# Patient Record
Sex: Male | Born: 1950 | Race: Black or African American | Hispanic: No | Marital: Single | Smoking: Former smoker
Health system: Southern US, Community
[De-identification: ages and names within clinical notes are randomized; demographics above are authoritative.]

## PROBLEM LIST (undated history)

## (undated) DIAGNOSIS — F79 Unspecified intellectual disabilities: Secondary | ICD-10-CM

## (undated) DIAGNOSIS — I10 Essential (primary) hypertension: Secondary | ICD-10-CM

---

## 1998-03-17 ENCOUNTER — Other Ambulatory Visit: Admission: RE | Admit: 1998-03-17 | Discharge: 1998-03-17 | Payer: Self-pay | Admitting: Family Medicine

## 2002-04-07 ENCOUNTER — Emergency Department (HOSPITAL_COMMUNITY): Admission: EM | Admit: 2002-04-07 | Discharge: 2002-04-07 | Payer: Self-pay | Admitting: Emergency Medicine

## 2011-11-10 ENCOUNTER — Emergency Department (HOSPITAL_COMMUNITY)
Admission: EM | Admit: 2011-11-10 | Discharge: 2011-11-10 | Disposition: A | Discharge status: Home / Self Care | Payer: Medicaid Other | Attending: Emergency Medicine | Admitting: Emergency Medicine

## 2011-11-10 ENCOUNTER — Encounter (HOSPITAL_COMMUNITY): Payer: Self-pay | Admitting: *Deleted

## 2011-11-10 DIAGNOSIS — X58XXXA Exposure to other specified factors, initial encounter: Secondary | ICD-10-CM | POA: Insufficient documentation

## 2011-11-10 DIAGNOSIS — F79 Unspecified intellectual disabilities: Secondary | ICD-10-CM | POA: Insufficient documentation

## 2011-11-10 DIAGNOSIS — T783XXA Angioneurotic edema, initial encounter: Secondary | ICD-10-CM | POA: Insufficient documentation

## 2011-11-10 DIAGNOSIS — I10 Essential (primary) hypertension: Secondary | ICD-10-CM | POA: Insufficient documentation

## 2011-11-10 DIAGNOSIS — E119 Type 2 diabetes mellitus without complications: Secondary | ICD-10-CM | POA: Insufficient documentation

## 2011-11-10 DIAGNOSIS — Z79899 Other long term (current) drug therapy: Secondary | ICD-10-CM | POA: Insufficient documentation

## 2011-11-10 HISTORY — DX: Unspecified intellectual disabilities: F79

## 2011-11-10 HISTORY — DX: Essential (primary) hypertension: I10

## 2011-11-10 MED ORDER — DIPHENHYDRAMINE HCL 25 MG PO CAPS
25.0000 mg | ORAL_CAPSULE | Freq: Once | ORAL | Status: AC
  Administered 2011-11-10: 25 mg via ORAL
  Filled 2011-11-10: qty 1

## 2011-11-10 MED ORDER — FAMOTIDINE 20 MG PO TABS: 20.0000 mg | ORAL_TABLET | Freq: Two times a day (BID) | ORAL | Status: DC

## 2011-11-10 MED ORDER — DIPHENHYDRAMINE HCL 25 MG PO TABS: 25.0000 mg | ORAL_TABLET | Freq: Four times a day (QID) | ORAL | Status: AC

## 2011-11-10 MED ORDER — FAMOTIDINE 20 MG PO TABS
20.0000 mg | ORAL_TABLET | Freq: Once | ORAL | Status: AC
  Administered 2011-11-10: 20 mg via ORAL
  Filled 2011-11-10: qty 1

## 2011-11-10 NOTE — ED Provider Notes (Signed)
History     CSN: [id number]  Arrival date & time 11/10/11  1623   First MD Initiated Contact with Patient 11/10/11 1714      Chief Complaint  Patient presents with  . Facial Swelling    (Consider location/radiation/quality/duration/timing/severity/associated sxs/prior treatment) HPI  Past Medical History  Diagnosis Date  . Diabetes mellitus   . Hypertension   . Mental retardation     History reviewed. No pertinent past surgical history.  History reviewed. No pertinent family history.  History  Substance Use Topics  . Smoking status: Never Smoker   . Smokeless tobacco: Not on file  . Alcohol Use: No      Review of Systems  Allergies  Review of patient's allergies indicates no known allergies.  Home Medications   Current Outpatient Rx  Name Route Sig Dispense Refill  . BENZTROPINE MESYLATE 0.5 MG PO TABS Oral Take 0.5 mg by mouth 2 (two) times daily.    Marland Kitchen CLOTRIMAZOLE 1 % EX CREA Topical Apply topically 2 (two) times daily as needed. For foot fungus    . GLIPIZIDE ER 5 MG PO TB24 Oral Take 5 mg by mouth 2 (two) times daily.    Marland Kitchen HYDROCHLOROTHIAZIDE 25 MG PO TABS Oral Take 25 mg by mouth daily.    Marland Kitchen LISINOPRIL 20 MG PO TABS Oral Take 20 mg by mouth daily.    Marland Kitchen LORATADINE 10 MG PO TABS Oral Take 10 mg by mouth every morning.    Marland Kitchen NIFEDIPINE 10 MG PO CAPS Oral Take 20 mg by mouth 2 (two) times daily.    Marland Kitchen NORTRIPTYLINE HCL 25 MG PO CAPS Oral Take 25 mg by mouth at bedtime.    Marland Kitchen POTASSIUM CHLORIDE CRYS ER 20 MEQ PO TBCR Oral Take 20 mEq by mouth daily.    Marland Kitchen RISPERIDONE 0.5 MG PO TBDP Oral Take 0.5-3 mg by mouth 2 (two) times daily. Takes 0.5mg  in the morning and 3mg  at night    . SERTRALINE HCL 50 MG PO TABS Oral Take 50 mg by mouth daily.    Marland Kitchen SOLIFENACIN SUCCINATE 5 MG PO TABS Oral Take 10 mg by mouth daily.    Marland Kitchen UREA 10 % EX LOTN Topical Apply topically 2 (two) times daily as needed. For foot fungus      BP 140/88  Pulse 74  Temp(Src) 97.9 F (36.6 C)  (Axillary)  Resp 18  SpO2 99%  Physical Exam  ED Course  Procedures (including critical care time)  Labs Reviewed - No data to display No results found.   No diagnosis found.    MDM  Duplicate note        Doug Sou, MD 11/14/11 [phone]

## 2011-11-10 NOTE — ED Provider Notes (Signed)
History    CSN: [id number]  Arrival date & time 11/10/11  1623   First MD Initiated Contact with Patient 11/10/11 1714      Chief Complaint  Patient presents with  . Facial Swelling    (Consider location/radiation/quality/duration/timing/severity/associated sxs/prior treatment) The history is provided by the patient and a caregiver. No language interpreter was used.  Patient developed swelling to right side of face earlier today.  No known precipitating cause, however, patient does take lisinopril.  Patient evaluated by his dentist today, no indication of tooth abscess or dental problem.  No shortness of breath.  No difficulty swallowing.  Handling secretions without difficulty.  Past Medical History  Diagnosis Date  . Diabetes mellitus   . Hypertension   . Mental retardation     History reviewed. No pertinent past surgical history.  History reviewed. No pertinent family history.  History  Substance Use Topics  . Smoking status: Never Smoker   . Smokeless tobacco: Not on file  . Alcohol Use: No      Review of Systems  HENT: Positive for facial swelling. Negative for drooling, trouble swallowing and voice change.   All other systems reviewed and are negative.    Allergies  Review of patient's allergies indicates no known allergies.  Home Medications   Current Outpatient Rx  Name Route Sig Dispense Refill  . BENZTROPINE MESYLATE 0.5 MG PO TABS Oral Take 0.5 mg by mouth 2 (two) times daily.    Marland Kitchen CLOTRIMAZOLE 1 % EX CREA Topical Apply topically 2 (two) times daily as needed. For foot fungus    . GLIPIZIDE ER 5 MG PO TB24 Oral Take 5 mg by mouth 2 (two) times daily.    Marland Kitchen HYDROCHLOROTHIAZIDE 25 MG PO TABS Oral Take 25 mg by mouth daily.    Marland Kitchen LISINOPRIL 20 MG PO TABS Oral Take 20 mg by mouth daily.    Marland Kitchen LORATADINE 10 MG PO TABS Oral Take 10 mg by mouth every morning.    Marland Kitchen NIFEDIPINE 10 MG PO CAPS Oral Take 20 mg by mouth 2 (two) times daily.    Marland Kitchen NORTRIPTYLINE HCL  25 MG PO CAPS Oral Take 25 mg by mouth at bedtime.    Marland Kitchen POTASSIUM CHLORIDE CRYS ER 20 MEQ PO TBCR Oral Take 20 mEq by mouth daily.    Marland Kitchen RISPERIDONE 0.5 MG PO TBDP Oral Take 0.5-3 mg by mouth 2 (two) times daily. Takes 0.5mg  in the morning and 3mg  at night    . SERTRALINE HCL 50 MG PO TABS Oral Take 50 mg by mouth daily.    Marland Kitchen SOLIFENACIN SUCCINATE 5 MG PO TABS Oral Take 10 mg by mouth daily.    Marland Kitchen UREA 10 % EX LOTN Topical Apply topically 2 (two) times daily as needed. For foot fungus      BP 140/88  Pulse 74  Temp(Src) 97.9 F (36.6 C) (Axillary)  Resp 18  SpO2 99%  Physical Exam  Nursing note and vitals reviewed. Constitutional: He is oriented to person, place, and time. He appears well-developed and well-nourished.  Eyes: Pupils are equal, round, and reactive to light.  Neck: Normal range of motion. Neck supple.  Cardiovascular: Normal rate, regular rhythm, normal heart sounds and intact distal pulses.   Pulmonary/Chest: Effort normal and breath sounds normal. No respiratory distress.  Abdominal: Soft. Bowel sounds are normal.  Musculoskeletal: Normal range of motion.  Lymphadenopathy:    He has no cervical adenopathy.  Neurological: He is alert and oriented to  person, place, and time.  Skin: Skin is warm and dry.  Psychiatric: He has a normal mood and affect. His behavior is normal. Judgment and thought content normal.    ED Course  Procedures (including critical care time)  Labs Reviewed - No data to display No results found.   No diagnosis found.  Patient seen by Dr. Ethelda Chick.  Likely mild angioedema d/t lisinopril.  No difficulty swallowing, no tongue swelling.  Handling secretions without difficulty.   Patient to stop lisinopril and contact Dr. Ginette Otto for appointment next week.  MDM          Jimmye Norman, NP 11/11/11 0003

## 2011-11-10 NOTE — Discharge Instructions (Signed)
Angioedema Angioedema (AE) is sudden puffiness (swelling) of the skin. It can happen to the face, genitals, and other body parts. You may be reacting to something you are sensitive to (allergic reaction). It may have been passed to you from your parents (hereditary), or it may develop by itself (acquired). You may also get red itchy patches of skin (hives). Attacks may be mild. Some attacks are life-threatening. Most often, the puffiness happens fast. It often gets better in 24 to 48 hours.  HOME CARE  Carry your emergency allergy medicines with you.   Wear a medical bracelet.   Avoid things that you know will cause this reaction (triggers).  GET HELP RIGHT AWAY IF:   You have trouble breathing.   You have trouble swallowing.   You pass out (faint).   You have another attack.   Your attacks happen more often or get worse.   AE was passed to you by your parents and you want to start having children.  MAKE SURE YOU:   Understand these instructions.   Will watch your condition.   Will get help right away if you are not doing well or get worse.  Document Released: 08/03/2009 Document Revised: 08/04/2011 Document Reviewed: 08/03/2009 [employer] Patient Information 2012 [employer]. 

## 2011-11-10 NOTE — ED Notes (Signed)
Pt woke up with swelling to right side of face, pt was taken to the dentist and dentist stated there was nothing wrong with his teeth. Pt reports pain to area.

## 2011-11-10 NOTE — ED Provider Notes (Signed)
Patient mentally retarded level V caveat history is given from his caregiver Patient with swelling of right cheek onset today seen by dentist earlier today, guardian reports that etiology of swelling is unclear, sent here for further evaluation On exam patient is alert no distress handling secretions well no swelling of tongue lips or uvula, there is mild swelling of the right cheek no fluctuance Suspect angioedema  Plan stop lisinopril Followup Dr.Avbuere next week  Doug Sou, MD 11/10/11 1733

## 2011-11-14 NOTE — ED Provider Notes (Signed)
Medical screening examination/treatment/procedure(s) were conducted as a shared visit with non-physician practitioner(s) and myself.  I personally evaluated the patient during the encounter  Doug Sou, MD 11/14/11 [phone]

## 2012-02-27 ENCOUNTER — Emergency Department (HOSPITAL_COMMUNITY): Payer: Medicaid Other

## 2012-02-27 ENCOUNTER — Emergency Department (HOSPITAL_COMMUNITY)
Admission: EM | Admit: 2012-02-27 | Discharge: 2012-02-27 | Disposition: A | Discharge status: Home / Self Care | Payer: Medicaid Other | Attending: Emergency Medicine | Admitting: Emergency Medicine

## 2012-02-27 ENCOUNTER — Encounter (HOSPITAL_COMMUNITY): Payer: Self-pay | Admitting: Emergency Medicine

## 2012-02-27 DIAGNOSIS — R109 Unspecified abdominal pain: Secondary | ICD-10-CM

## 2012-02-27 DIAGNOSIS — E119 Type 2 diabetes mellitus without complications: Secondary | ICD-10-CM | POA: Insufficient documentation

## 2012-02-27 DIAGNOSIS — R4182 Altered mental status, unspecified: Secondary | ICD-10-CM | POA: Insufficient documentation

## 2012-02-27 DIAGNOSIS — R5383 Other fatigue: Secondary | ICD-10-CM | POA: Insufficient documentation

## 2012-02-27 DIAGNOSIS — Z79899 Other long term (current) drug therapy: Secondary | ICD-10-CM | POA: Insufficient documentation

## 2012-02-27 DIAGNOSIS — I10 Essential (primary) hypertension: Secondary | ICD-10-CM | POA: Insufficient documentation

## 2012-02-27 DIAGNOSIS — R5381 Other malaise: Secondary | ICD-10-CM | POA: Insufficient documentation

## 2012-02-27 DIAGNOSIS — K5900 Constipation, unspecified: Secondary | ICD-10-CM

## 2012-02-27 DIAGNOSIS — F79 Unspecified intellectual disabilities: Secondary | ICD-10-CM | POA: Insufficient documentation

## 2012-02-27 LAB — LIPASE, BLOOD: Lipase: 15 U/L (ref 11–59)

## 2012-02-27 LAB — POCT I-STAT TROPONIN I: Troponin i, poc: 0.01 ng/mL (ref 0.00–0.08)

## 2012-02-27 LAB — CBC WITH DIFFERENTIAL/PLATELET
Hemoglobin: 13.2 g/dL (ref 13.0–17.0)
Lymphs Abs: 0.6 10*3/uL — ABNORMAL LOW (ref 0.7–4.0)
Monocytes Relative: 3 % (ref 3–12)
Neutro Abs: 5.4 10*3/uL (ref 1.7–7.7)
Neutrophils Relative %: 87 % — ABNORMAL HIGH (ref 43–77)
RBC: 4.29 MIL/uL (ref 4.22–5.81)

## 2012-02-27 LAB — COMPREHENSIVE METABOLIC PANEL
Albumin: 3.7 g/dL (ref 3.5–5.2)
Alkaline Phosphatase: 65 U/L (ref 39–117)
BUN: 13 mg/dL (ref 6–23)
Chloride: 100 mEq/L (ref 96–112)
Glucose, Bld: 194 mg/dL — ABNORMAL HIGH (ref 70–99)
Potassium: 4.3 mEq/L (ref 3.5–5.1)
Total Bilirubin: 0.2 mg/dL — ABNORMAL LOW (ref 0.3–1.2)

## 2012-02-27 LAB — URINALYSIS, ROUTINE W REFLEX MICROSCOPIC
Bilirubin Urine: NEGATIVE
Hgb urine dipstick: NEGATIVE
Ketones, ur: NEGATIVE mg/dL
Nitrite: NEGATIVE
pH: 7 (ref 5.0–8.0)

## 2012-02-27 IMAGING — CT CT HEAD W/O CM
1 of 3 series · 16 of 30 positions shown, 20 images · non-contrast
Comparison: None.

CLINICAL DATA: Altered mental status, headache

CT HEAD WITHOUT CONTRAST
TECHNIQUE: Contiguous axial images were obtained from the base of
the skull through the vertex without contrast.

[Series 2: head routine 4.8 h37s · axial · 0.46mm/px · z∈[-126,+31]mm · 16 of 36 slices shown, 20 images]
[im 2/36  brain]
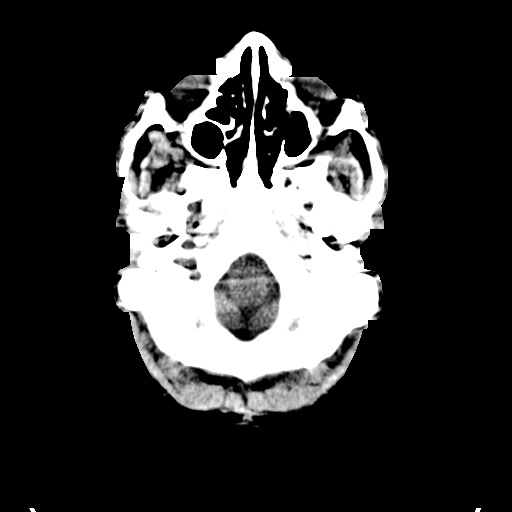
[im 2/36  bone]
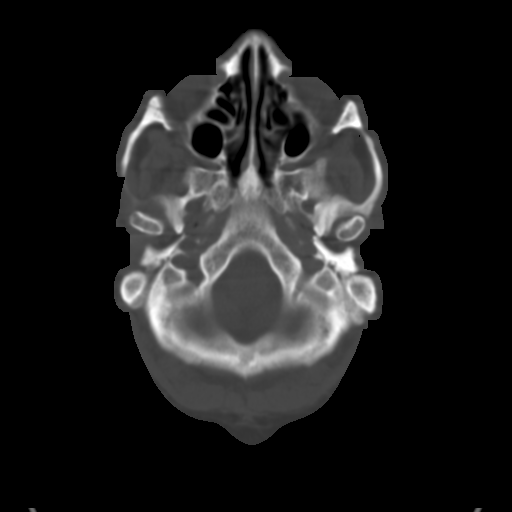
[im 4/36  brain]
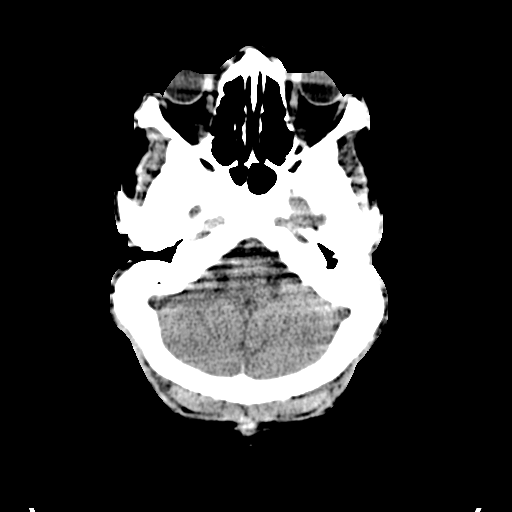
[im 6/36  brain]
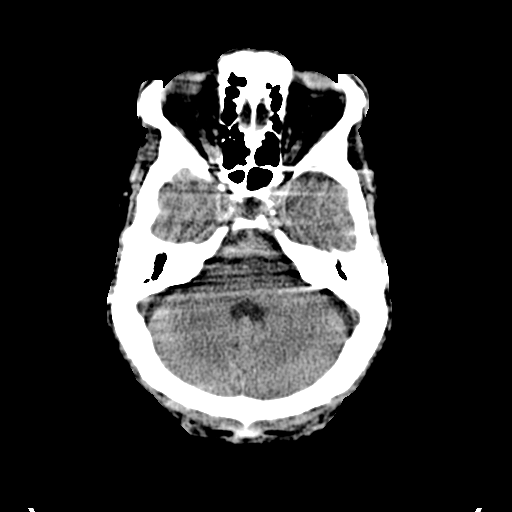
[im 8/36  brain]
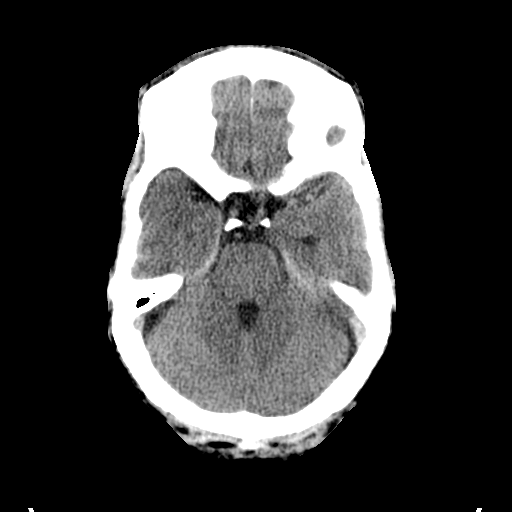
[im 12/36  brain]
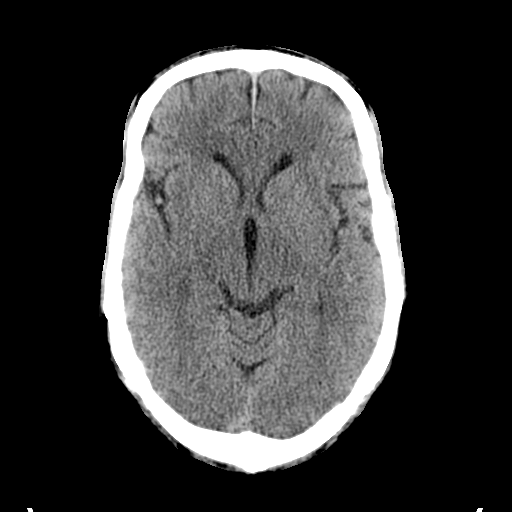
[im 12/36  bone]
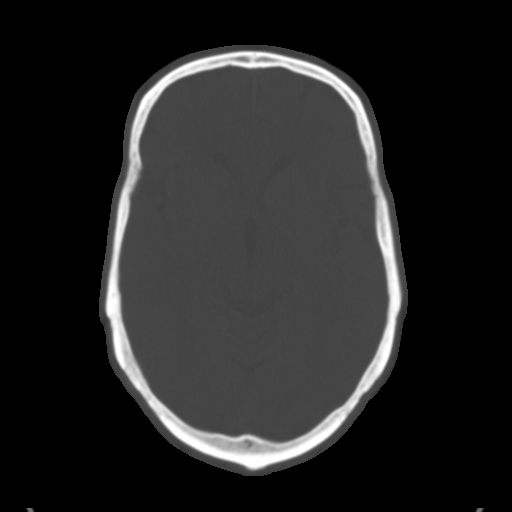
[im 13/36  brain]
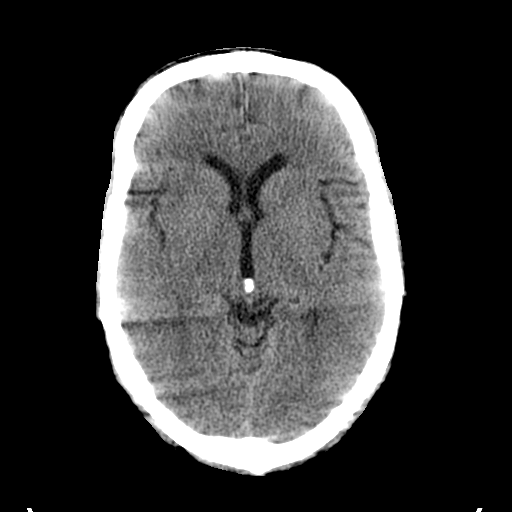
[im 15/36  brain]
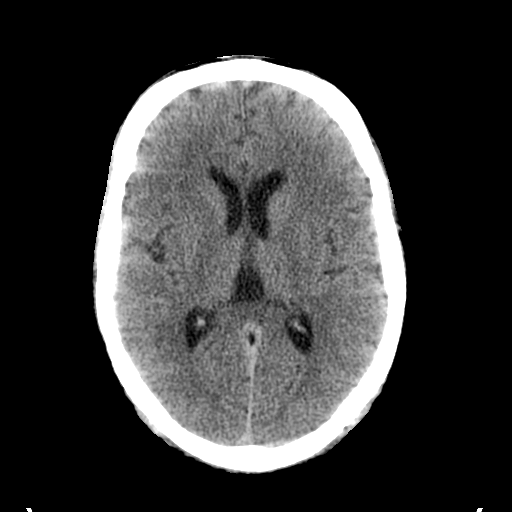
[im 17/36  brain]
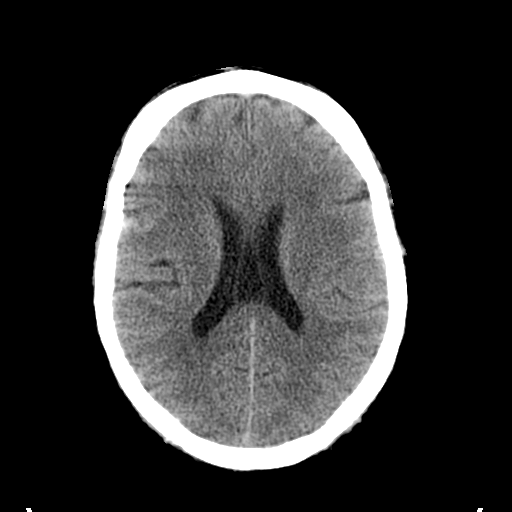
[im 19/36  brain]
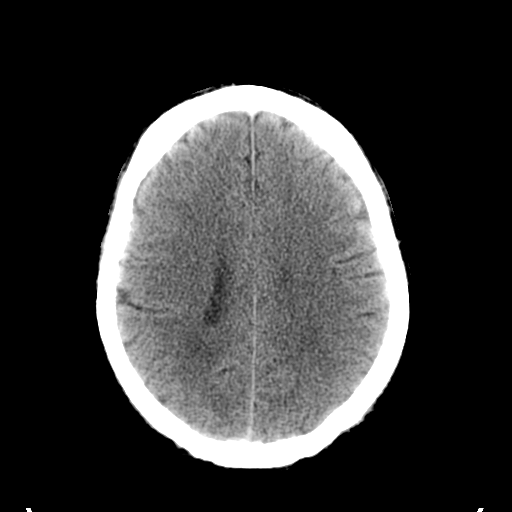
[im 19/36  bone]
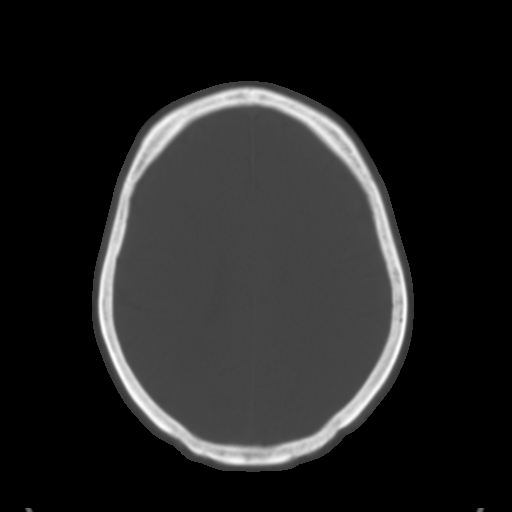
[im 21/36  brain]
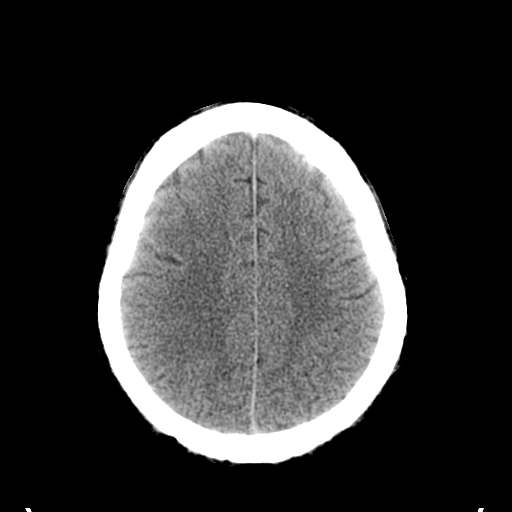
[im 23/36  brain]
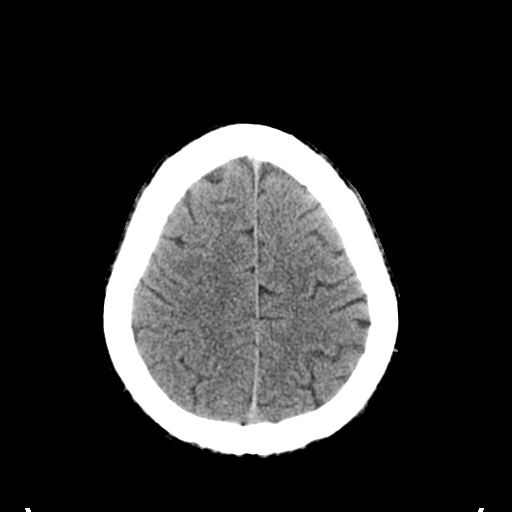
[im 24/36  brain]
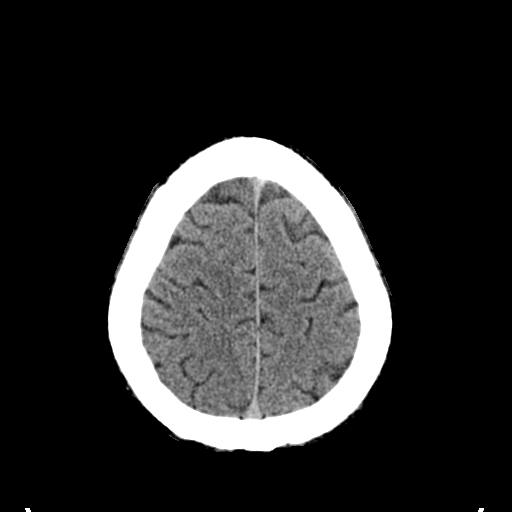
[im 28/36  brain]
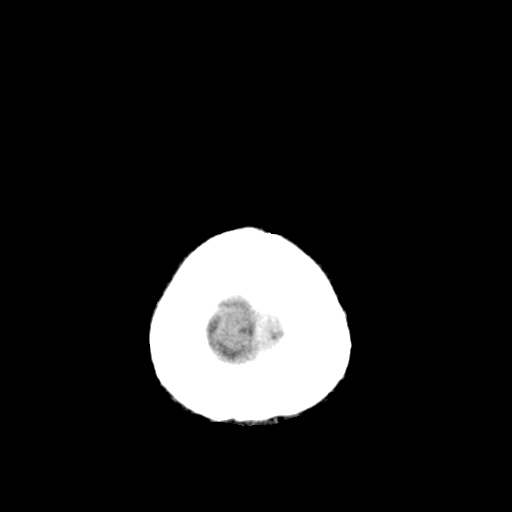
[im 28/36  bone]
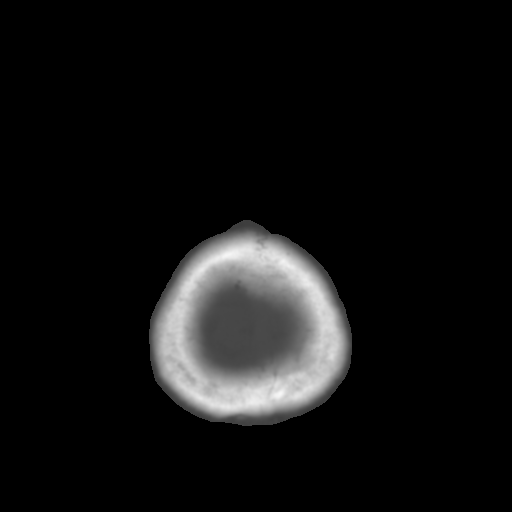
[im 30/36  brain]
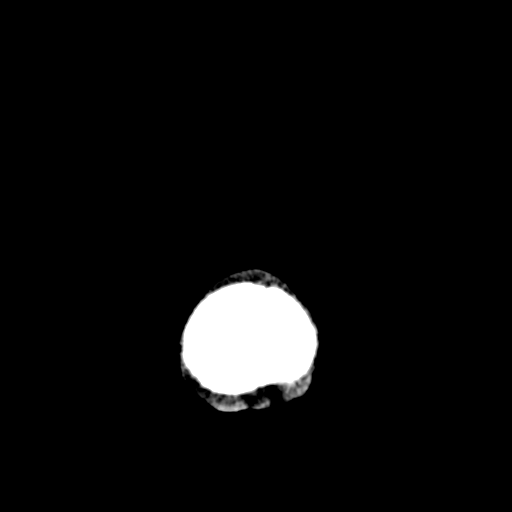
[im 32/36  brain]
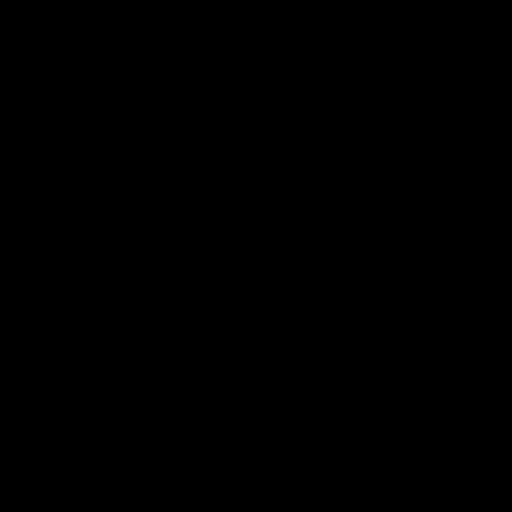
[im 34/36  brain]
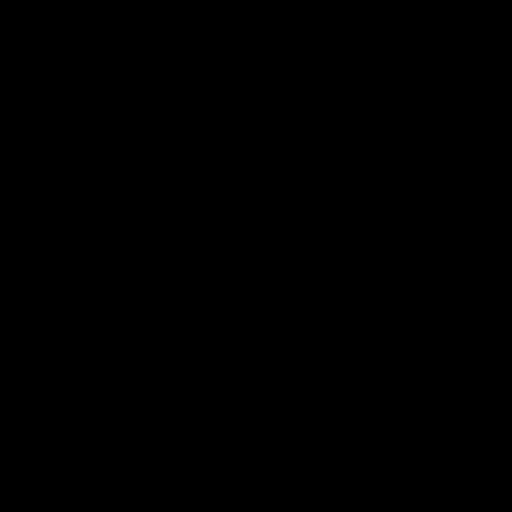

[16 of 30 positions shown; findings below may reference images not displayed]

FINDINGS: No skull fracture is noted.  Paranasal sinuses and mastoid air
cells are unremarkable.

No intracranial hemorrhage, mass effect or midline shift.  Motion
artifact are noted.

There is no acute infarction.  No mass lesion is noted on this
unenhanced scan.  The gray and white matter differentiation is
preserved.
No intra or extra-axial fluid collection.
IMPRESSION: No acute intracranial abnormality.

## 2012-02-27 IMAGING — CR DG ABDOMEN ACUTE W/ 1V CHEST
3 series · 3 of 3 positions shown · non-contrast
Comparison: None.

CLINICAL DATA: Epigastric pain

ACUTE ABDOMEN SERIES (ABDOMEN 2 VIEW & CHEST 1 VIEW)

[x chest ap]
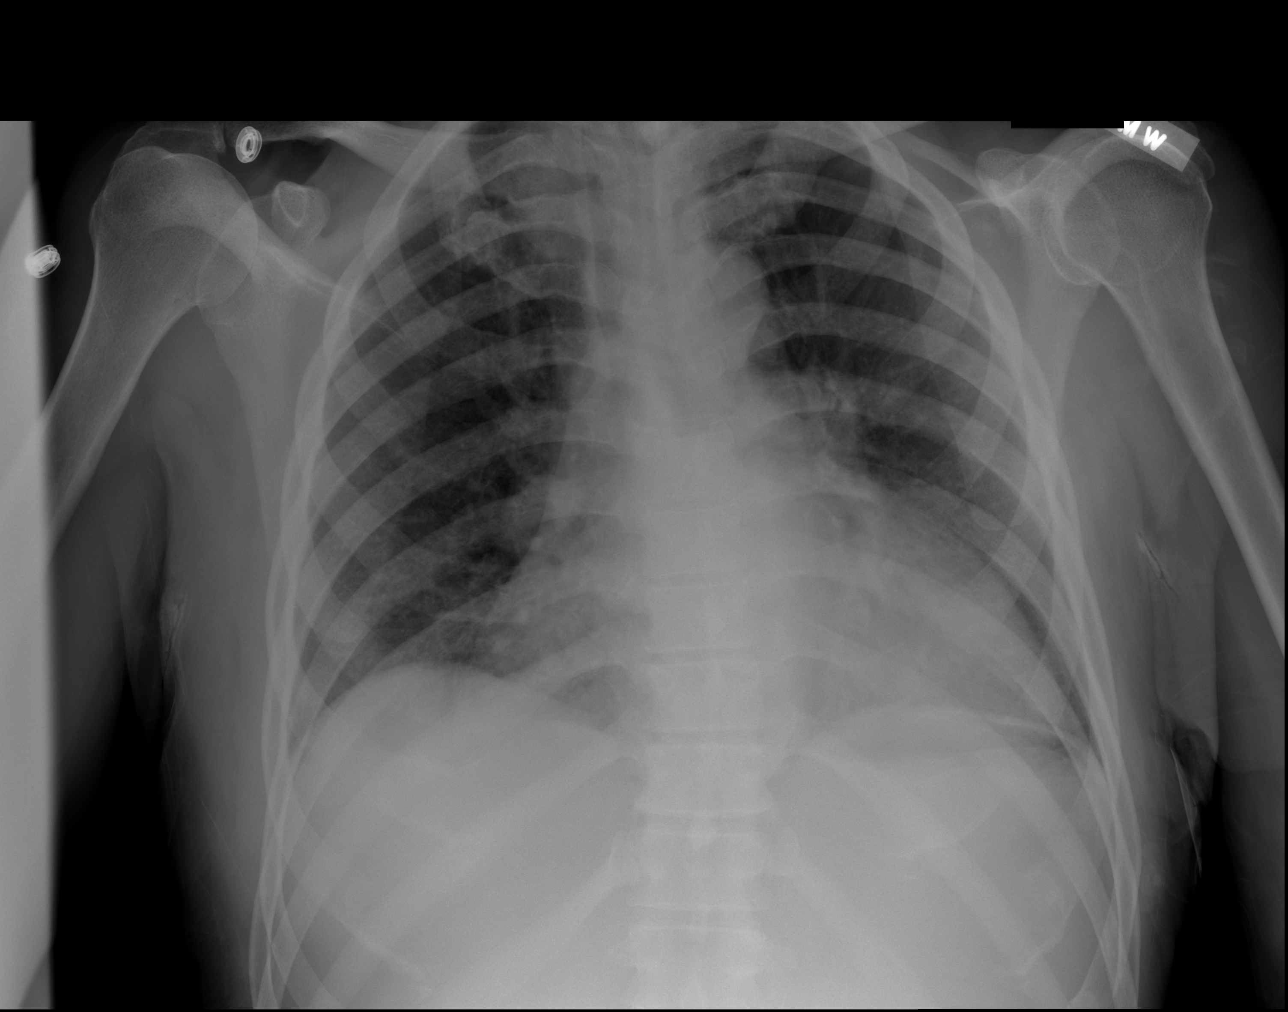

[w abdomen decub]
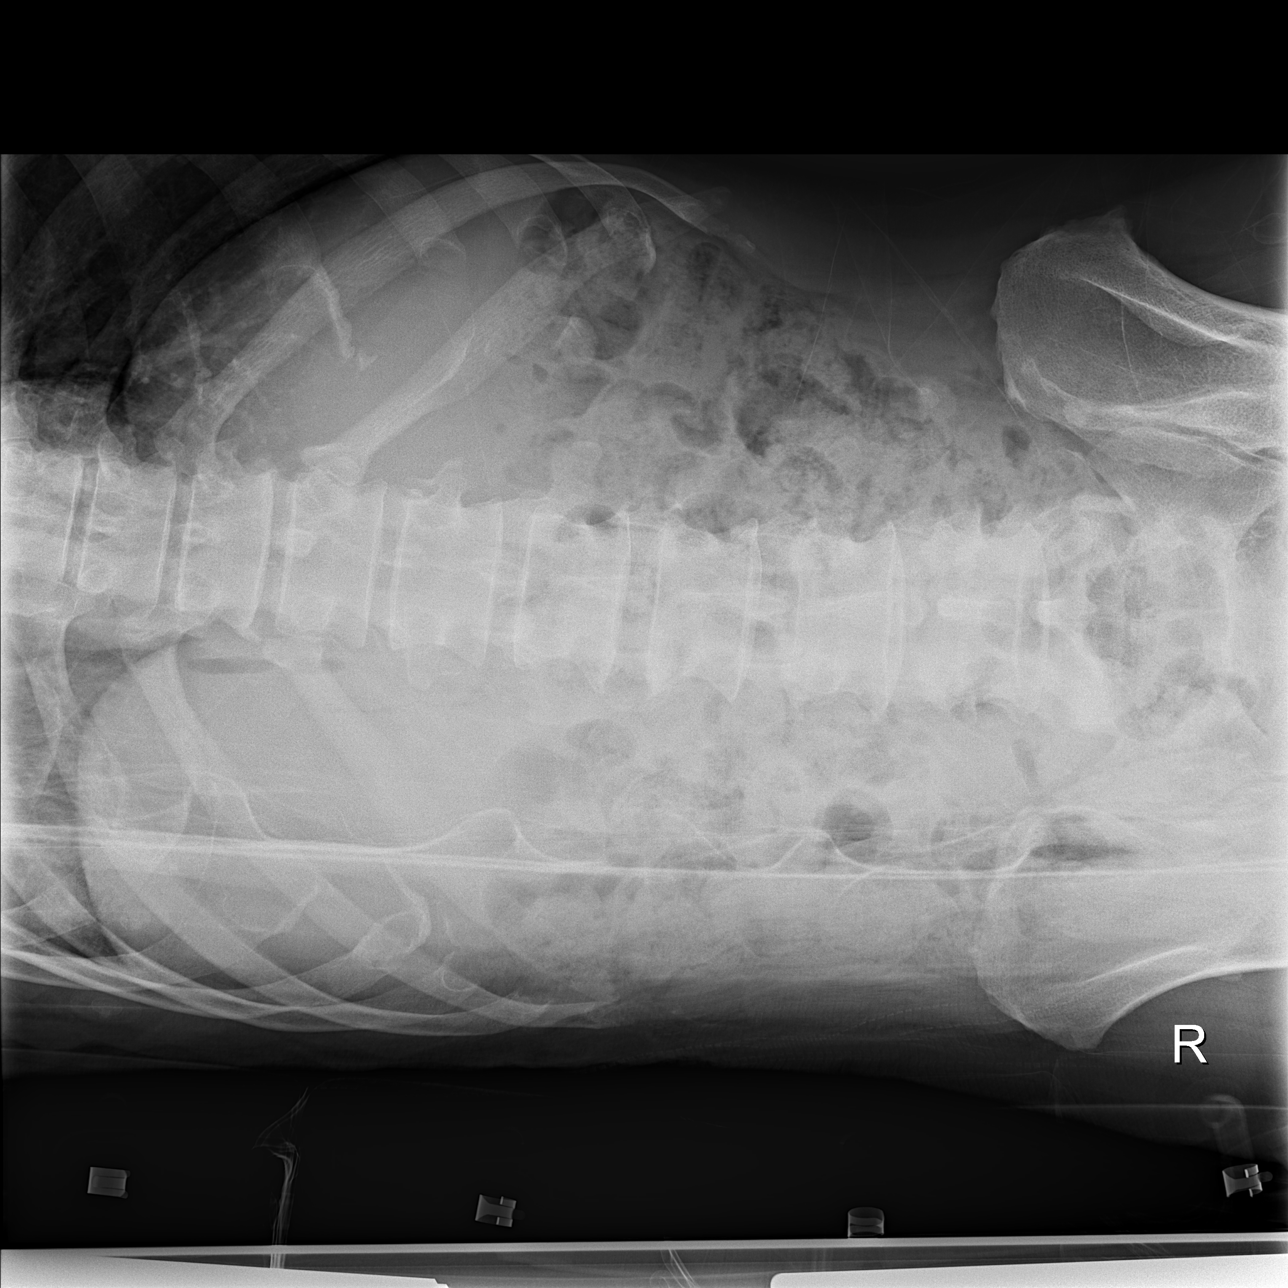

[t abdomen supine]
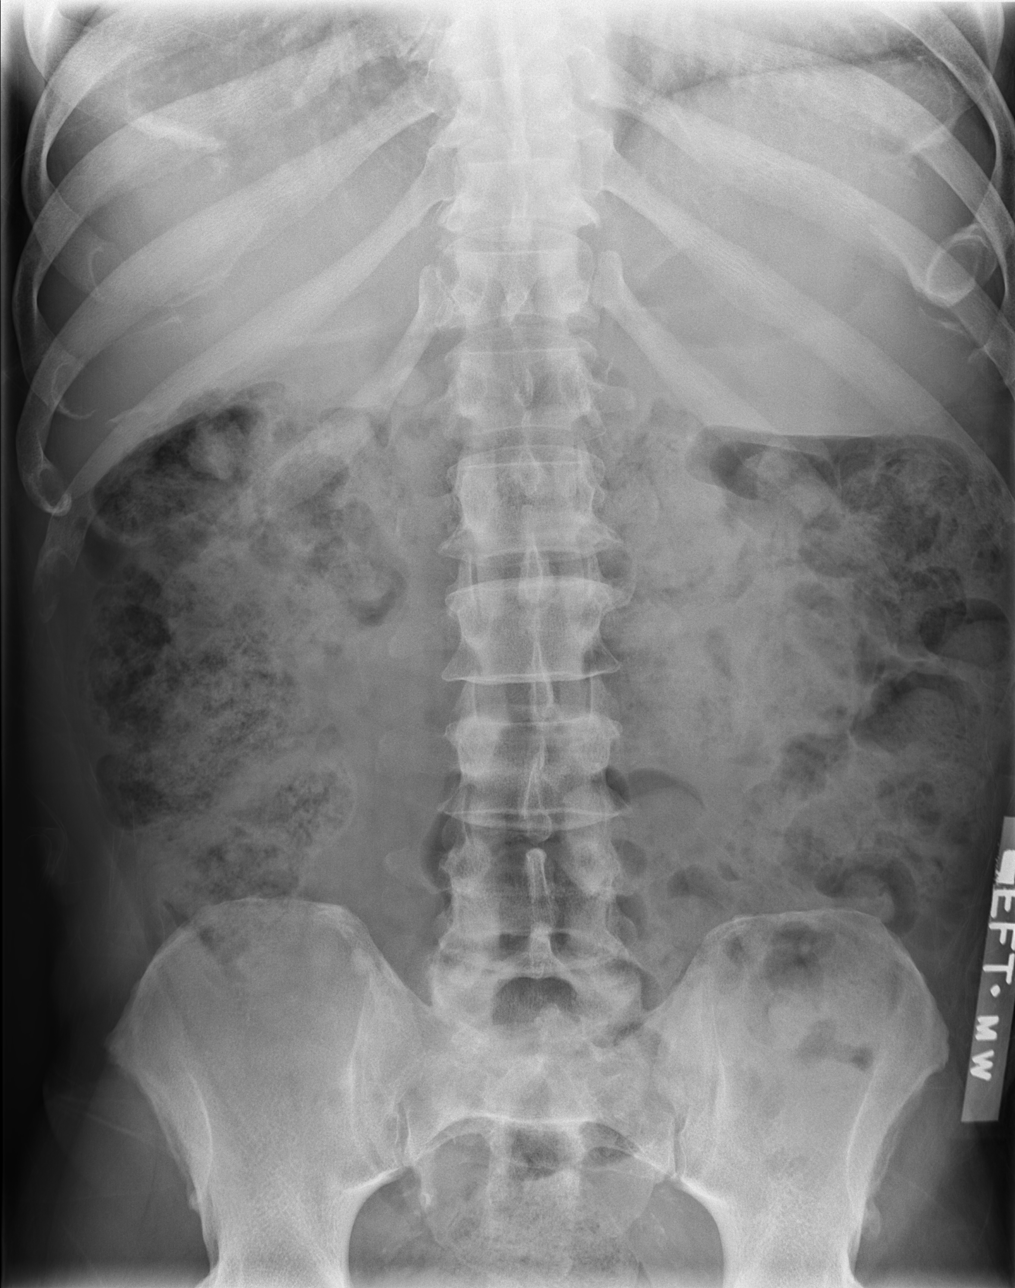

[3 of 3 positions shown; findings below may reference images not displayed]

FINDINGS: Borderline cardiomegaly noted.  No acute infiltrate or
pulmonary edema.  Mild basilar atelectasis.

There is nonspecific nonobstructive bowel gas pattern.  Stool noted
throughout the colon.  No free abdominal air.  Bony structures are
unremarkable.
IMPRESSION: No active disease.  Borderline cardiomegaly.  Nonspecific
nonobstructive bowel gas pattern.  Stool noted throughout the
colon.  No free abdominal air.

## 2012-02-27 MED ORDER — IOHEXOL 300 MG/ML  SOLN
20.0000 mL | INTRAMUSCULAR | Status: AC
  Administered 2012-02-27 (×2): 20 mL via ORAL

## 2012-02-27 MED ORDER — GI COCKTAIL ~~LOC~~
30.0000 mL | Freq: Once | ORAL | Status: AC
  Administered 2012-02-27: 30 mL via ORAL
  Filled 2012-02-27: qty 30

## 2012-02-27 MED ORDER — IOHEXOL 300 MG/ML  SOLN
80.0000 mL | Freq: Once | INTRAMUSCULAR | Status: AC | PRN
  Administered 2012-02-27: 80 mL via INTRAVENOUS

## 2012-02-27 MED ORDER — POLYETHYLENE GLYCOL 3350 17 GM/SCOOP PO POWD: 17.0000 g | Freq: Every day | ORAL | Status: AC

## 2012-02-27 MED ORDER — ONDANSETRON HCL 4 MG/2ML IJ SOLN
4.0000 mg | Freq: Once | INTRAMUSCULAR | Status: AC
  Administered 2012-02-27: 4 mg via INTRAVENOUS
  Filled 2012-02-27: qty 2

## 2012-02-27 NOTE — ED Provider Notes (Signed)
History     CSN: [id number]  Arrival date & time 02/27/12  0907   First MD Initiated Contact with Patient 02/27/12 1008      Chief Complaint  Patient presents with  . Abdominal Pain  . Fatigue  . Altered Mental Status    (Consider location/radiation/quality/duration/timing/severity/associated sxs/prior treatment) HPI  Patient who has mental retardation as well as non-insulin-dependent diabetes and hypertension is brought to the emergency department by his group home caretaker with complaint of patient waking this morning complaining of abdominal pain as well as the group home staff noting that the patient had more slurred speech more than his baseline slurred speech and states that he was "stumbling about the house." They state that patient continues to complain of abdominal pain when he does not normally have complaints of pain. They state that he felt well yesterday and ate and drank normally and had a normal bowel movement last night. Group home caregiver states that he did have a small breakfast this morning. He denies fevers, chills, vomiting, diarrhea, recent sick illness or sick contacts, noticeable extremity weakness, facial droop. A level V caveat applies due to patient's mental retardation.  Past Medical History  Diagnosis Date  . Diabetes mellitus   . Hypertension   . Mental retardation     History reviewed. No pertinent past surgical history.  History reviewed. No pertinent family history.  History  Substance Use Topics  . Smoking status: Never Smoker   . Smokeless tobacco: Not on file  . Alcohol Use: No      Review of Systems  Unable to perform ROS   Allergies  Review of patient's allergies indicates no known allergies.  Home Medications   Current Outpatient Rx  Name Route Sig Dispense Refill  . BENZTROPINE MESYLATE 0.5 MG PO TABS Oral Take 0.5 mg by mouth every morning.     Marland Kitchen CLOTRIMAZOLE 1 % EX CREA Topical Apply topically 2 (two) times daily as  needed. For foot fungus    . GLIPIZIDE ER 5 MG PO TB24 Oral Take 5 mg by mouth 2 (two) times daily.    Marland Kitchen HYDROCHLOROTHIAZIDE 25 MG PO TABS Oral Take 25 mg by mouth daily.    Marland Kitchen LORATADINE 10 MG PO TABS Oral Take 10 mg by mouth every morning.    Marland Kitchen NIFEDIPINE ER OSMOTIC 90 MG PO TB24 Oral Take 90 mg by mouth daily.    Marland Kitchen NORTRIPTYLINE HCL 25 MG PO CAPS Oral Take 25 mg by mouth every morning.     Marland Kitchen POTASSIUM CHLORIDE CRYS ER 20 MEQ PO TBCR Oral Take 20 mEq by mouth daily.    Marland Kitchen RISPERIDONE 0.5 MG PO TBDP Oral Take 0.5-3 mg by mouth 2 (two) times daily. Takes 0.5mg  in the morning and 3mg  at night    . SERTRALINE HCL 50 MG PO TABS Oral Take 50 mg by mouth daily.    Marland Kitchen SOLIFENACIN SUCCINATE 5 MG PO TABS Oral Take 5 mg by mouth daily.    Marland Kitchen UREA 10 % EX LOTN Topical Apply topically 2 (two) times daily as needed. For foot fungus      BP 151/89  Pulse 73  Temp 98.6 F (37 C) (Oral)  Resp 18  SpO2 100%  Physical Exam  Nursing note and vitals reviewed. Constitutional: He appears well-developed and well-nourished. No distress.  HENT:  Head: Normocephalic and atraumatic.  Eyes: Conjunctivae and EOM are normal. Pupils are equal, round, and reactive to light.  Neck: Normal range of  motion. Neck supple.  Cardiovascular: Normal rate, regular rhythm, normal heart sounds and intact distal pulses.  Exam reveals no gallop and no friction rub.   No murmur heard. Pulmonary/Chest: Effort normal and breath sounds normal. No respiratory distress. He has no wheezes. He has no rales. He exhibits no tenderness.  Abdominal: Soft. Bowel sounds are normal. He exhibits no distension and no mass. There is tenderness. There is no rebound and no guarding.       Abdomen soft and patient will grad to epigastric region when asked to point to area of pain however abdomen is soft and I can not elicit grimacing or guarding with deep palpation of abdomen. No peritoneal signs.   Musculoskeletal: Normal range of motion. He exhibits  no edema and no tenderness.  Neurological: He is alert.       Difficult neurological exam due to patient's mental retardation and lack of understanding in given instructions.   Skin: Skin is warm and dry. No rash noted. He is not diaphoretic. No erythema.  Psychiatric: He has a normal mood and affect.    ED Course  Procedures (including critical care time)  GI cocktail, IV zofran and fluids.   Labs Reviewed  CBC WITH DIFFERENTIAL - Abnormal; Notable for the following:    Neutrophils Relative 87 (*)     Lymphocytes Relative 10 (*)     Lymphs Abs 0.6 (*)     All other components within normal limits  COMPREHENSIVE METABOLIC PANEL - Abnormal; Notable for the following:    Glucose, Bld 194 (*)     Total Bilirubin 0.2 (*)     All other components within normal limits  URINALYSIS, ROUTINE W REFLEX MICROSCOPIC - Abnormal; Notable for the following:    Protein, ur 30 (*)     All other components within normal limits  GLUCOSE, CAPILLARY - Abnormal; Notable for the following:    Glucose-Capillary 157 (*)     All other components within normal limits  LIPASE, BLOOD  POCT I-STAT TROPONIN I  URINE MICROSCOPIC-ADD ON   Ct Head Wo Contrast  02/27/2012  *RADIOLOGY REPORT*  Clinical Data: Altered mental status, headache  CT HEAD WITHOUT CONTRAST  Technique:  Contiguous axial images were obtained from the base of the skull through the vertex without contrast.  Comparison: None.  Findings: No skull fracture is noted.  Paranasal sinuses and mastoid air cells are unremarkable.  No intracranial hemorrhage, mass effect or midline shift.  Motion artifact are noted.  There is no acute infarction.  No mass lesion is noted on this unenhanced scan.  The gray and white matter differentiation is preserved. No intra or extra-axial fluid collection.  IMPRESSION: No acute intracranial abnormality.  Original Report Authenticated By: Natasha Mead, M.D.     No diagnosis found.    MDM  No neuro focal deficits.  However patient will have to be walked to wrong ataxia prior to dispo. His exam is relatively benign up until this point no acute findings on labs and non acute abdomen. Pending CT abdomen pelvis to determine ultimate disposition. Sign out given to [facility]. Dr. Preston Fleeting evaluated patient at bedside and is agreeable with plan.        [place], [place] 02/27/12 1150

## 2012-02-27 NOTE — ED Notes (Signed)
Documentation on rounding it was ment for room 14. Am 9:29 Ottis Stain

## 2012-02-27 NOTE — ED Notes (Signed)
Per group home staff; pt not acting norm when waking up this am; pt with hx of MR; pt sts abd pain; pt normally communicates with staff and not communicating well this am; pt with some mild difficulty walking; no obvious neuro deficits

## 2012-02-27 NOTE — ED Notes (Signed)
Pt back from radiology.  Monitors placed.

## 2012-02-27 NOTE — ED Notes (Signed)
Pt has finished contrast, CT notified.

## 2012-02-27 NOTE — Discharge Instructions (Signed)
Please begin taking an Aspirin 81 mg a day and Miralax for constipation. Follow-up with Dr. Concepcion Elk for further evaluation.

## 2012-02-27 NOTE — ED Notes (Signed)
Pt taken to radiology before meds could be given.  Will give meds when pt returns.

## 2012-02-27 NOTE — ED Notes (Addendum)
Owner of the group home accompanies the pt.  He states that pt had a normal BM last night.  Denies N/V.  Owner states that pt ate well last night.  Pt states his stomach hurts.  Owner states that pt appeared unsteady on his feet this morning, which is not normal for him and speech slightly more slurred.  Pt was normal when he went to bed at 1930.  Pt awoke this morning at 0600 and that is when staff noticed the changes.

## 2012-02-27 NOTE — ED Notes (Signed)
Pt's CBG was 157 after I checked it 10:03am JG.

## 2012-02-27 NOTE — ED Provider Notes (Signed)
61 year old male with a mental retardation has been complaining of abdominal pain today. Caregiver states it he is normally active and talkative and he is very subdued. He also never complains of anything hurting. Exam is of rather benign. He has mild systolic hypertension with blood pressure 150 you've 1/89. Lungs are clear and heart has regular rate and rhythm. Abdomen is soft with mild tenderness to deep palpation in the right midabdomen. No masses are felt and there is no hepatosplenomegaly. Workup has been initiated.   Date: 02/27/2012  Rate: 74  Rhythm: normal sinus rhythm and premature ventricular contractions (PVC)  QRS Axis: normal  Intervals: normal  ST/T Wave abnormalities: normal  Conduction Disutrbances:none  Narrative Interpretation: Low voltage ECG. No prior ECG available for comparison.  Old EKG Reviewed: none available    [name], MD 02/27/12 1146

## 2012-02-27 NOTE — ED Provider Notes (Signed)
4:01 PM Patient has had normal labs and imaging. Advised caregiver that patient did have moderate amount of stool and recommended stool softeners. Also recommended aspirin 81 mg daily. Advise close followup with his primary care physician Dr. Concepcion Elk. Caregiver voiced understanding and they are ready for discharge.  Thomasene Lot, PA-C 02/27/12 [phone]

## 2012-02-27 NOTE — ED Notes (Signed)
Instructions given to care giver

## 2012-02-28 NOTE — ED Provider Notes (Signed)
Medical screening examination/treatment/procedure(s) were conducted as a shared visit with non-physician practitioner(s) and myself.  I personally evaluated the patient during the encounter   Amanee Iacovelli, MD 02/28/12 0708 

## 2012-02-28 NOTE — ED Provider Notes (Signed)
Medical screening examination/treatment/procedure(s) were conducted as a shared visit with non-physician practitioner(s) and myself.  I personally evaluated the patient during the encounter   [name], MD 02/28/12 [phone]

## 2012-09-03 ENCOUNTER — Emergency Department (INDEPENDENT_AMBULATORY_CARE_PROVIDER_SITE_OTHER)
Admission: EM | Admit: 2012-09-03 | Discharge: 2012-09-03 | Disposition: A | Discharge status: Home / Self Care | Payer: Medicaid Other | Source: Home / Self Care | Attending: Emergency Medicine | Admitting: Emergency Medicine

## 2012-09-03 ENCOUNTER — Encounter (HOSPITAL_COMMUNITY): Payer: Self-pay

## 2012-09-03 DIAGNOSIS — H1130 Conjunctival hemorrhage, unspecified eye: Secondary | ICD-10-CM

## 2012-09-03 MED ORDER — POLYETHYL GLYCOL-PROPYL GLYCOL 0.4-0.3 % OP SOLN: OPHTHALMIC | Status: DC

## 2012-09-03 NOTE — ED Notes (Signed)
Eye redness noted earlier today, no known injury

## 2012-09-03 NOTE — ED Provider Notes (Signed)
Chief Complaint  Patient presents with  . Conjunctivitis    History of Present Illness:   The patient is a 62 year old male with mental retardation and paranoid schizophrenia. He is brought in today by his group home worker because of a red, right eye. There is no history of any trauma or injury. It does not seem to bother him at all he denies any pain or change in his vision. There has been no drainage. He has not had any fever, chills, nasal congestion, rhinorrhea, sore throat, or cough.  Review of Systems:  Other than noted above, the patient denies any of the following symptoms: Systemic:  No fever, chills, sweats, fatigue, or weight loss. Eye:  No redness, eye pain, photophobia, discharge, blurred vision, or diplopia. ENT:  No nasal congestion, rhinorrhea, or sore throat. Lymphatic:  No adenopathy. Skin:  No rash or pruritis.  PMFSH:  Past medical history, family history, social history, meds, and allergies were reviewed.  Physical Exam:   Vital signs:  BP 127/62  Pulse 72  Temp 97.2 F (36.2 C) (Oral)  Resp 12  SpO2 100% General:  Alert and in no distress. Eye:  He has a subcutaneous conjunctival hematoma superior to the cornea. There was no drainage, lids were normal, cornea was intact to fluorescein staining, anterior chamber was normal, funduscopic exam was normal, PERRLA, full EOMs. ENT:  TMs and canals clear.  Nasal mucosa normal.  No intra-oral lesions, mucous membranes moist, pharynx clear. Neck:  No adenopathy tenderness or mass. Skin:  Clear, warm and dry.  Assessment:  The encounter diagnosis was Subconjunctival hematoma.  Plan:   1.  The following meds were prescribed:   New Prescriptions   POLYETHYL GLYCOL-PROPYL GLYCOL (SYSTANE) 0.4-0.3 % SOLN    1 drop in right eye 3 times daily for 1 week.   2.  The patient was instructed in symptomatic care and handouts were given. 3.  The patient was told to return if becoming worse in any way, if no better in 3 or 4 days,  and given some red flag symptoms that would indicate earlier return.     Reuben Likes, MD 09/03/12 2133

## 2012-09-19 DIAGNOSIS — F79 Unspecified intellectual disabilities: Secondary | ICD-10-CM | POA: Insufficient documentation

## 2012-09-19 DIAGNOSIS — E1159 Type 2 diabetes mellitus with other circulatory complications: Secondary | ICD-10-CM | POA: Insufficient documentation

## 2012-09-19 DIAGNOSIS — E119 Type 2 diabetes mellitus without complications: Secondary | ICD-10-CM | POA: Insufficient documentation

## 2012-09-19 DIAGNOSIS — I10 Essential (primary) hypertension: Secondary | ICD-10-CM | POA: Insufficient documentation

## 2013-02-22 ENCOUNTER — Emergency Department (HOSPITAL_COMMUNITY): Payer: Medicaid Other

## 2013-02-22 ENCOUNTER — Emergency Department (HOSPITAL_COMMUNITY)
Admission: EM | Admit: 2013-02-22 | Discharge: 2013-02-22 | Disposition: A | Discharge status: Home / Self Care | Payer: Medicaid Other | Attending: Emergency Medicine | Admitting: Emergency Medicine

## 2013-02-22 DIAGNOSIS — Z79899 Other long term (current) drug therapy: Secondary | ICD-10-CM | POA: Insufficient documentation

## 2013-02-22 DIAGNOSIS — H05019 Cellulitis of unspecified orbit: Secondary | ICD-10-CM | POA: Insufficient documentation

## 2013-02-22 DIAGNOSIS — Z7982 Long term (current) use of aspirin: Secondary | ICD-10-CM | POA: Insufficient documentation

## 2013-02-22 DIAGNOSIS — E119 Type 2 diabetes mellitus without complications: Secondary | ICD-10-CM | POA: Insufficient documentation

## 2013-02-22 DIAGNOSIS — F79 Unspecified intellectual disabilities: Secondary | ICD-10-CM | POA: Insufficient documentation

## 2013-02-22 DIAGNOSIS — I10 Essential (primary) hypertension: Secondary | ICD-10-CM | POA: Insufficient documentation

## 2013-02-22 DIAGNOSIS — L03213 Periorbital cellulitis: Secondary | ICD-10-CM

## 2013-02-22 LAB — CBC WITH DIFFERENTIAL/PLATELET
Eosinophils Absolute: 0.2 10*3/uL (ref 0.0–0.7)
HCT: 40.5 % (ref 39.0–52.0)
Hemoglobin: 13.7 g/dL (ref 13.0–17.0)
Lymphs Abs: 1.4 10*3/uL (ref 0.7–4.0)
MCH: 30.6 pg (ref 26.0–34.0)
MCV: 90.6 fL (ref 78.0–100.0)
Monocytes Absolute: 0.4 10*3/uL (ref 0.1–1.0)
Monocytes Relative: 6 % (ref 3–12)
Neutrophils Relative %: 68 % (ref 43–77)
RBC: 4.47 MIL/uL (ref 4.22–5.81)

## 2013-02-22 LAB — POCT I-STAT, CHEM 8
Chloride: 101 mEq/L (ref 96–112)
Creatinine, Ser: 1 mg/dL (ref 0.50–1.35)
Glucose, Bld: 67 mg/dL — ABNORMAL LOW (ref 70–99)
Hemoglobin: 15 g/dL (ref 13.0–17.0)
Potassium: 3.4 mEq/L — ABNORMAL LOW (ref 3.5–5.1)

## 2013-02-22 IMAGING — CT CT ORBITS W/ CM
3 series · 15 of 47 positions shown, 18 images · IV contrast (omnipaque)
Comparison: Prior CT from [DATE]

CLINICAL DATA: The right eye swelling

CT ORBITS WITH CONTRAST
TECHNIQUE: Multidetector CT imaging of the orbits was performed
following the bolus administration of intravenous contrast.
Contrast: 80mL OMNIPAQUE IOHEXOL 300 MG/ML  SOLN

[Series 3: orbit/facial 2.0 h30s · axial · 0.39mm/px · z∈[-168,-62]mm · 9 of 63 slices shown, 12 images]
[im 5/63  brain]
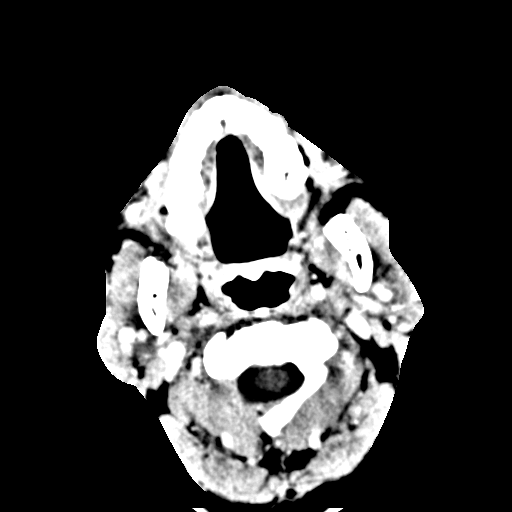
[im 5/63  bone]
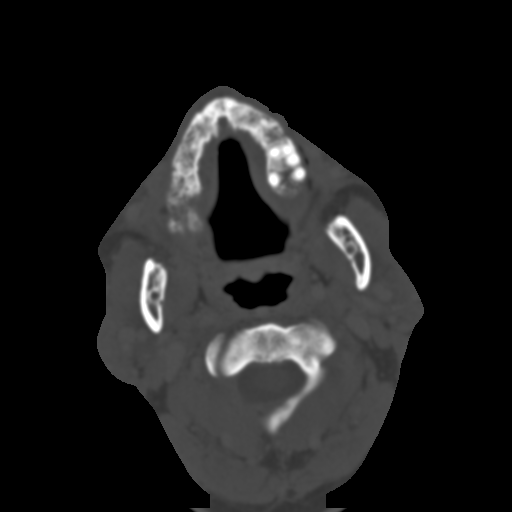
[im 11/63  bone]
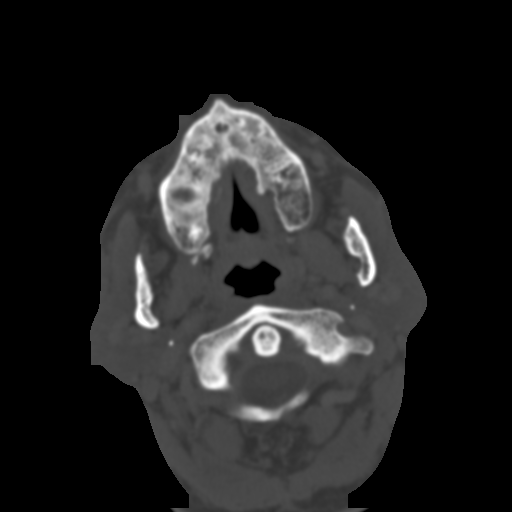
[im 18/63  bone]
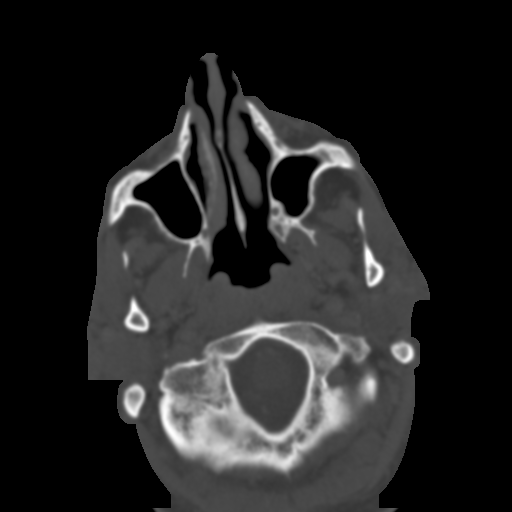
[im 24/63  bone]
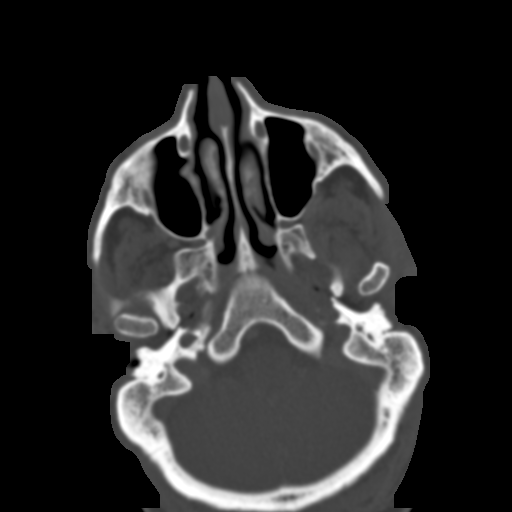
[im 33/63  brain]
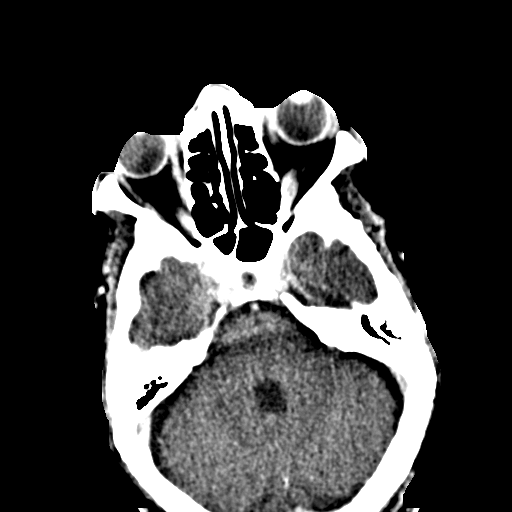
[im 33/63  bone]
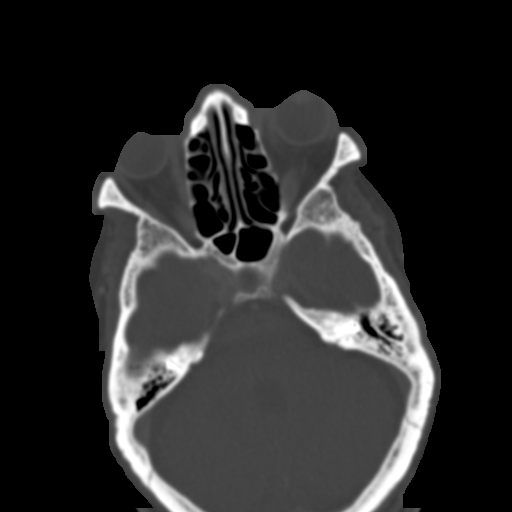
[im 39/63  bone]
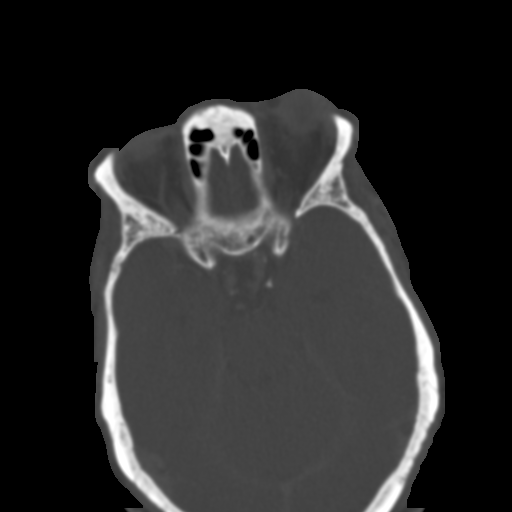
[im 45/63  bone]
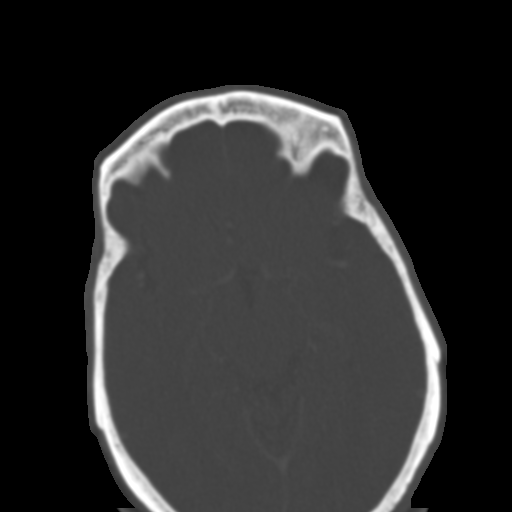
[im 52/63  bone]
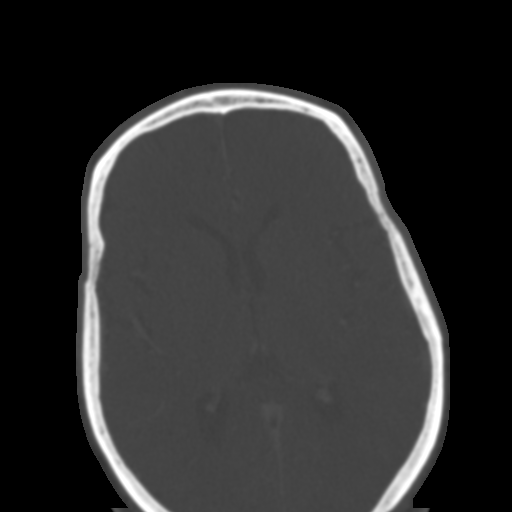
[im 58/63  brain]
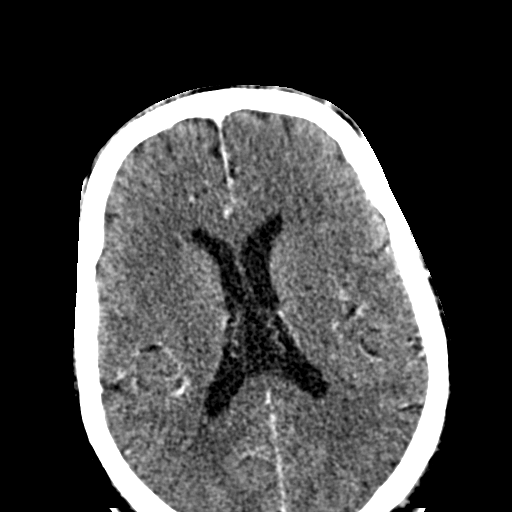
[im 58/63  bone]
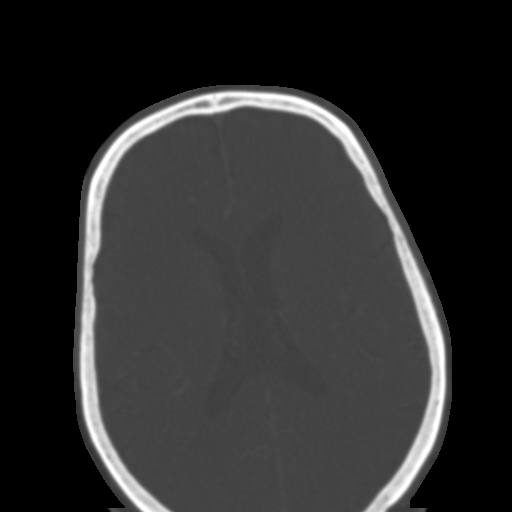

[Series 7: coronals · coronal · 0.29mm/px · 3 of 92 slices shown]
[im 31/92  bone]
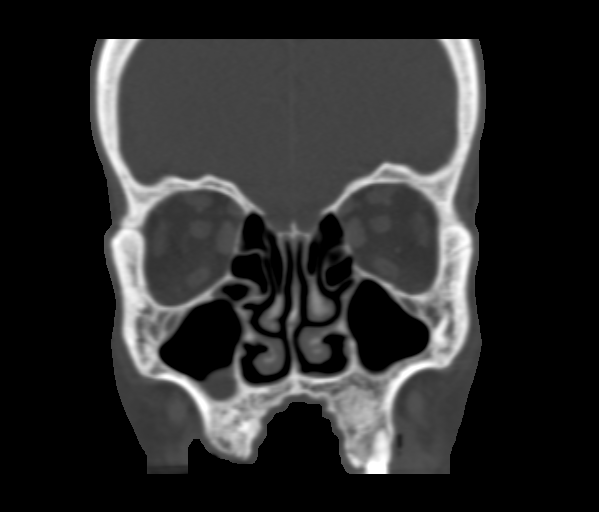
[im 41/92  bone]
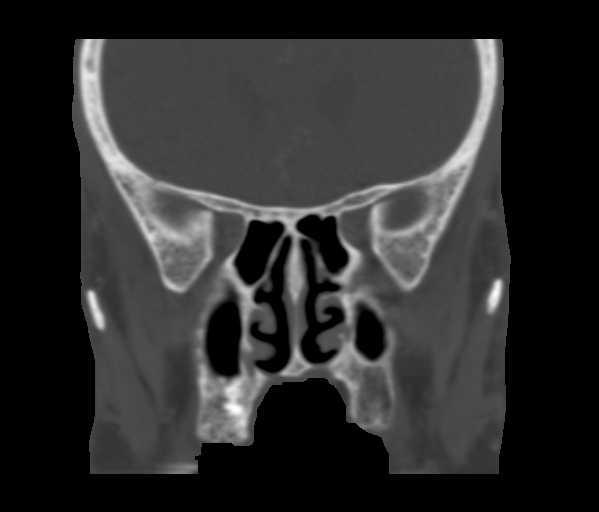
[im 51/92  bone]
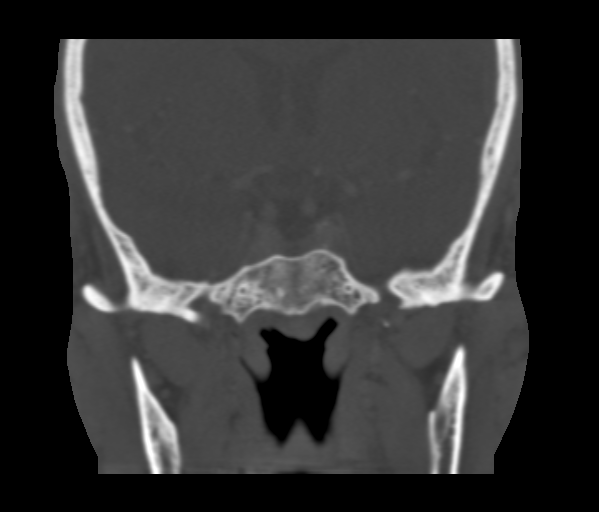

[Series 8: sagittals · sagittal · 0.29mm/px · 3 of 77 slices shown]
[im 26/77  bone]
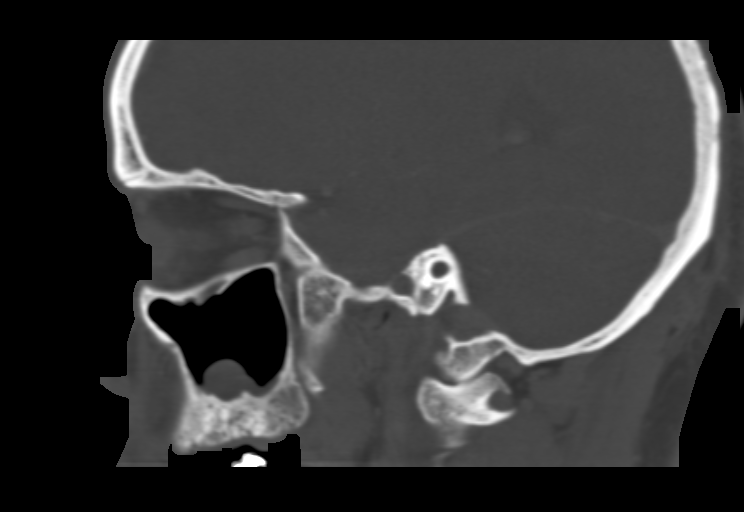
[im 39/77  bone]
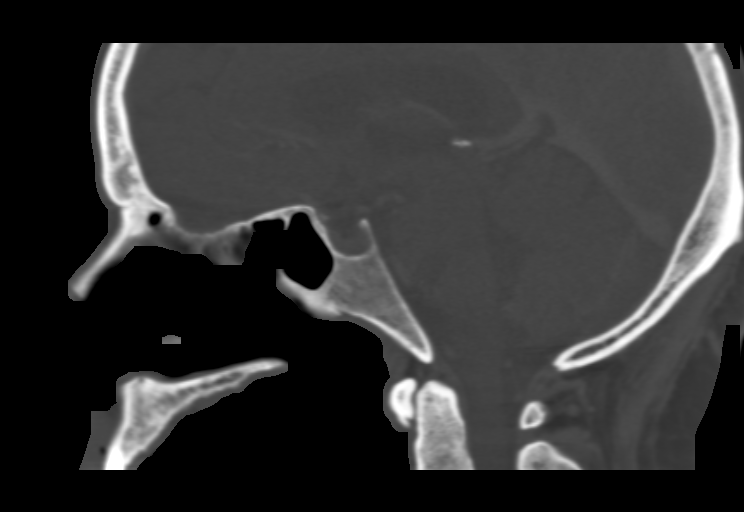
[im 51/77  bone]
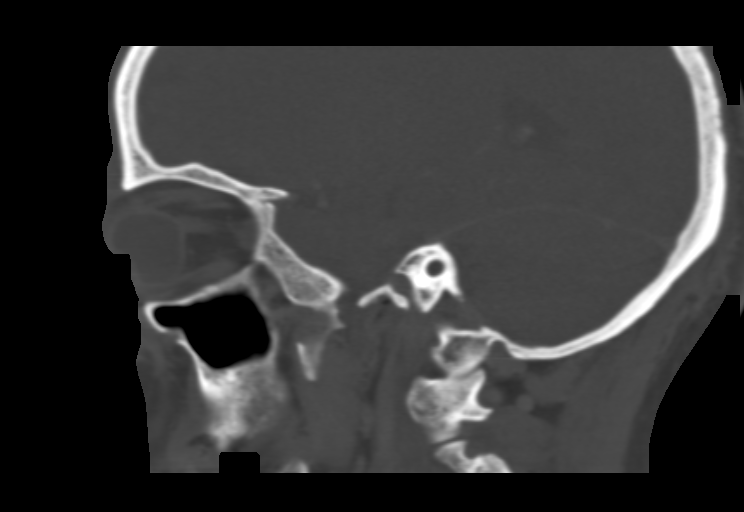

[15 of 47 positions shown; findings below may reference images not displayed]

FINDINGS: The left lobe is somewhat proptotic with periorbital soft
tissue swelling and infiltration involving the preseptal
extracranial soft tissues.  The intraconal fat is clear on the
left.  No mass lesions are seen within the left bony orbit.  The
extraocular muscles are normal.  The left optic nerve is grossly
normal. No drainable fluid collection.

No swelling or other abnormality is seen about the right orbit.

Polypoid opacity is present within the floor of the right maxillary
sinus.  Otherwise, the paranasal sinuses are clear.  Mastoid air
cells are underdeveloped, but are pneumatized.
IMPRESSION: Mild proptosis and swelling of the neural soft tissues about the
left eye, may represent preseptal cellulitis in the correct
clinical setting.  No drainable fluid collections identified.  No
abnormality identified about the right eye.

## 2013-02-22 MED ORDER — IOHEXOL 300 MG/ML  SOLN
80.0000 mL | Freq: Once | INTRAMUSCULAR | Status: AC | PRN
  Administered 2013-02-22: 80 mL via INTRAVENOUS

## 2013-02-22 MED ORDER — TETRACAINE HCL 0.5 % OP SOLN
1.0000 [drp] | Freq: Once | OPHTHALMIC | Status: AC
  Administered 2013-02-22: 1 [drp] via OPHTHALMIC
  Filled 2013-02-22: qty 2

## 2013-02-22 MED ORDER — AMOXICILLIN-POT CLAVULANATE 875-125 MG PO TABS
1.0000 | ORAL_TABLET | Freq: Once | ORAL | Status: AC
  Administered 2013-02-22: 1 via ORAL
  Filled 2013-02-22: qty 1

## 2013-02-22 MED ORDER — FLUORESCEIN SODIUM 1 MG OP STRP
1.0000 | ORAL_STRIP | Freq: Once | OPHTHALMIC | Status: AC
  Administered 2013-02-22: 1 via OPHTHALMIC
  Filled 2013-02-22: qty 1

## 2013-02-22 MED ORDER — AMOXICILLIN-POT CLAVULANATE 875-125 MG PO TABS: 1.0000 | ORAL_TABLET | Freq: Two times a day (BID) | ORAL | Status: DC

## 2013-02-22 NOTE — ED Provider Notes (Signed)
History    CSN: [id number] Arrival date & time 02/22/13  2130  First MD Initiated Contact with Patient 02/22/13 1016     Chief Complaint  Patient presents with  . Facial Swelling   (Consider location/radiation/quality/duration/timing/severity/associated sxs/prior Treatment) The history is provided by the patient and a caregiver.   Patient presents to the ED with caregiver for left eye pain and swelling.  Patient has mental retardation and has difficulty explaining his problem and answering questions. Caregiver reports that when he arrived for duty this morning patient's left eye was swollen.  There is no bite, abrasions, or laceration present.  No new soaps or detergents.  Patient does not have a history of glaucoma or cataracts.  Pt does not wear contact lenses or glasses.  No prior episodes of similar.  Pt does have an opthalmologist- Dr. Mitzi Davenport.  Past Medical History  Diagnosis Date  . Diabetes mellitus   . Hypertension   . Mental retardation    No past surgical history on file. No family history on file. History  Substance Use Topics  . Smoking status: Never Smoker   . Smokeless tobacco: Not on file  . Alcohol Use: No    Review of Systems  Unable to perform ROS: Other  MENTAL RETARDATION  Allergies  Review of patient's allergies indicates no known allergies.  Home Medications   Current Outpatient Rx  Name  Route  Sig  Dispense  Refill  . aspirin EC 81 MG tablet   Oral   Take 81 mg by mouth daily.         . benztropine (COGENTIN) 0.5 MG tablet   Oral   Take 0.5 mg by mouth every morning.          . clotrimazole (LOTRIMIN) 1 % cream   Topical   Apply topically 2 (two) times daily as needed. For foot fungus         . econazole nitrate 1 % cream   Topical   Apply 1 application topically 2 (two) times daily. Apply to foot         . glipiZIDE (GLUCOTROL XL) 5 MG 24 hr tablet   Oral   Take 5 mg by mouth 2 (two) times daily.         .  hydrochlorothiazide (HYDRODIURIL) 25 MG tablet   Oral   Take 25 mg by mouth daily.         Marland Kitchen loratadine (CLARITIN) 10 MG tablet   Oral   Take 10 mg by mouth every morning.         . mirabegron ER (MYRBETRIQ) 25 MG TB24   Oral   Take 25 mg by mouth at bedtime.         Marland Kitchen NIFEdipine (PROCARDIA XL/ADALAT-CC) 90 MG 24 hr tablet   Oral   Take 90 mg by mouth daily.         . nortriptyline (PAMELOR) 25 MG capsule   Oral   Take 25 mg by mouth at bedtime.          . potassium chloride SA (K-DUR,KLOR-CON) 20 MEQ tablet   Oral   Take 20 mEq by mouth daily.         . risperiDONE (RISPERDAL M-TABS) 0.5 MG disintegrating tablet   Oral   Take 0.5-3 mg by mouth 2 (two) times daily. Takes 0.5mg  in the morning and 3mg  at night         . sertraline (ZOLOFT) 50 MG tablet  Oral   Take 50 mg by mouth daily.         . tamsulosin (FLOMAX) 0.4 MG CAPS   Oral   Take 0.4 mg by mouth daily after breakfast.         . urea 10 % lotion   Topical   Apply topically 2 (two) times daily as needed. For foot fungus          BP 147/90  Pulse 78  Temp(Src) 96.7 F (35.9 C) (Oral)  Resp 18  SpO2 99%  Physical Exam  Nursing note and vitals reviewed. Constitutional: He is oriented to person, place, and time. He appears well-developed and well-nourished. No distress.  HENT:  Head: Normocephalic and atraumatic.  Mouth/Throat: Oropharynx is clear and moist.  Eyes: Conjunctivae and EOM are normal. Pupils are equal, round, and reactive to light. Right conjunctiva is not injected. Left conjunctiva is not injected. Left conjunctiva has no hemorrhage. Left eye exhibits normal extraocular motion and no nystagmus.  Fundoscopic exam:      The left eye shows no hemorrhage.  Slit lamp exam:      The left eye shows no corneal abrasion, no corneal flare, no corneal ulcer, no foreign body, no hyphema, no hypopyon, no fluorescein uptake and no anterior chamber bulge.  Left eyelid moderately  swollen with mild erythema; EOM intact, PERRL (direct and consensual), conjunctiva non-injected, no hemorrhage, exudate or FB present  Neck: Normal range of motion.  Cardiovascular: Normal rate, regular rhythm and normal heart sounds.   Pulmonary/Chest: Effort normal and breath sounds normal. No respiratory distress. He has no wheezes.  Musculoskeletal: Normal range of motion.  Neurological: He is alert and oriented to person, place, and time.  Skin: Skin is warm and dry.  Psychiatric: He has a normal mood and affect.  Mental retardation, replies to questions are non-specific    ED Course  Procedures (including critical care time) Labs Reviewed  POCT I-STAT, CHEM 8 - Abnormal; Notable for the following:    Potassium 3.4 (*)    Glucose, Bld 67 (*)    All other components within normal limits  CBC WITH DIFFERENTIAL   Ct Orbits W/cm  02/22/2013   *RADIOLOGY REPORT*  Clinical Data: The right eye swelling  CT ORBITS WITH CONTRAST  Technique:  Multidetector CT imaging of the orbits was performed following the bolus administration of intravenous contrast.  Contrast: 80mL OMNIPAQUE IOHEXOL 300 MG/ML  SOLN  Comparison: Prior CT from 02/27/2012  Findings: The left lobe is somewhat proptotic with periorbital soft tissue swelling and infiltration involving the preseptal extracranial soft tissues.  The intraconal fat is clear on the left.  No mass lesions are seen within the left bony orbit.  The extraocular muscles are normal.  The left optic nerve is grossly normal. No drainable fluid collection.  No swelling or other abnormality is seen about the right orbit.  Polypoid opacity is present within the floor of the right maxillary sinus.  Otherwise, the paranasal sinuses are clear.  Mastoid air cells are underdeveloped, but are pneumatized.  IMPRESSION:  Mild proptosis and swelling of the neural soft tissues about the left eye, may represent preseptal cellulitis in the correct clinical setting.  No drainable  fluid collections identified.  No abnormality identified about the right eye.   Original Report Authenticated By: Rise Mu, M.D.   1. Preseptal cellulitis of left eye     MDM   Left upper lid swollen and mildly erythematous without direct eye involvement, likely  preseptal cellulitis.  Labs as above. Given patient's condition and inability to answer questions about sx will obtain CT orbits with contrast to assess for orbital cellulitis.  CT consistent with preseptal cellulitis. Rx Augmentin-first dose given in the ED. Patient is established with the eye doctor, Dr. Mitzi Davenport- followup with them if experienced trouble with vision. Followup with primary care physician when finished with abx to ensure complete resolution of infection.  Discussed plan with caregiver, he acknowledged understanding and agreed.    Garlon Hatchet, PA-C 02/22/13 1552

## 2013-02-22 NOTE — ED Notes (Signed)
Due to disability pt is unable to communicate a pain scale or understand a visual acuity exam.  Pt will answer yes to almost any question.  Caregiver is helpful in answering questions

## 2013-02-22 NOTE — ED Notes (Signed)
Pt and caretaker report rt eye swelling that started sometime in the night.  Caretaker reports eye was not swollen last night. Pt states that the eye is scratchy and feels as if there is something in the eye. Pt alert oriented X4

## 2013-02-25 NOTE — ED Provider Notes (Signed)
Medical screening examination/treatment/procedure(s) were performed by non-physician practitioner and as supervising physician I was immediately available for consultation/collaboration.  Tevis Conger R. Markel Mergenthaler, MD 02/25/13 1502 

## 2013-06-07 ENCOUNTER — Other Ambulatory Visit: Payer: Self-pay | Admitting: *Deleted

## 2013-06-07 NOTE — Telephone Encounter (Signed)
Faxed request to change Urea lotion to cream.  I okayed change by fax.

## 2013-07-22 ENCOUNTER — Emergency Department (HOSPITAL_COMMUNITY)
Admission: EM | Admit: 2013-07-22 | Discharge: 2013-07-22 | Disposition: A | Discharge status: Nursing Home (Includes ICF) | Payer: Medicaid Other | Source: Home / Self Care | Attending: Emergency Medicine | Admitting: Emergency Medicine

## 2013-07-22 ENCOUNTER — Encounter (HOSPITAL_COMMUNITY): Payer: Self-pay | Admitting: Emergency Medicine

## 2013-07-22 ENCOUNTER — Emergency Department (HOSPITAL_COMMUNITY)
Admission: EM | Admit: 2013-07-22 | Discharge: 2013-07-22 | Disposition: A | Discharge status: Home / Self Care | Payer: Medicaid Other | Attending: Emergency Medicine | Admitting: Emergency Medicine

## 2013-07-22 ENCOUNTER — Emergency Department (HOSPITAL_COMMUNITY): Payer: Medicaid Other

## 2013-07-22 DIAGNOSIS — Z79899 Other long term (current) drug therapy: Secondary | ICD-10-CM | POA: Insufficient documentation

## 2013-07-22 DIAGNOSIS — R29810 Facial weakness: Secondary | ICD-10-CM

## 2013-07-22 DIAGNOSIS — Z8659 Personal history of other mental and behavioral disorders: Secondary | ICD-10-CM | POA: Insufficient documentation

## 2013-07-22 DIAGNOSIS — F17200 Nicotine dependence, unspecified, uncomplicated: Secondary | ICD-10-CM | POA: Insufficient documentation

## 2013-07-22 DIAGNOSIS — E119 Type 2 diabetes mellitus without complications: Secondary | ICD-10-CM | POA: Insufficient documentation

## 2013-07-22 DIAGNOSIS — Z7982 Long term (current) use of aspirin: Secondary | ICD-10-CM | POA: Insufficient documentation

## 2013-07-22 DIAGNOSIS — I10 Essential (primary) hypertension: Secondary | ICD-10-CM | POA: Insufficient documentation

## 2013-07-22 LAB — POCT I-STAT, CHEM 8
BUN: 12 mg/dL (ref 6–23)
BUN: 13 mg/dL (ref 6–23)
Calcium, Ion: 1.24 mmol/L (ref 1.13–1.30)
Calcium, Ion: 1.26 mmol/L (ref 1.13–1.30)
Chloride: 101 mEq/L (ref 96–112)
Chloride: 102 meq/L (ref 96–112)
Creatinine, Ser: 1 mg/dL (ref 0.50–1.35)
Creatinine, Ser: 1.1 mg/dL (ref 0.50–1.35)
Glucose, Bld: 106 mg/dL — ABNORMAL HIGH (ref 70–99)
Glucose, Bld: 82 mg/dL (ref 70–99)
HCT: 43 % (ref 39.0–52.0)
HCT: 46 % (ref 39.0–52.0)
Hemoglobin: 14.6 g/dL (ref 13.0–17.0)
Hemoglobin: 15.6 g/dL (ref 13.0–17.0)
Potassium: 3.2 mEq/L — ABNORMAL LOW (ref 3.5–5.1)
Potassium: 3.4 meq/L — ABNORMAL LOW (ref 3.5–5.1)
Sodium: 144 mEq/L (ref 135–145)
Sodium: 144 meq/L (ref 135–145)
TCO2: 28 mmol/L (ref 0–100)
TCO2: 29 mmol/L (ref 0–100)

## 2013-07-22 IMAGING — CT CT HEAD W/O CM
1 of 2 series · 16 of 30 positions shown, 20 images · non-contrast
Comparison: [DATE]

CLINICAL DATA: Right facial droop

EXAM:
CT HEAD WITHOUT CONTRAST
TECHNIQUE: Contiguous axial images were obtained from the base of the skull
through the vertex without intravenous contrast.

[Series 2: head 5.0 h30s · axial · 0.45mm/px · z∈[-149,+1]mm · 16 of 34 slices shown, 20 images]
[im 2/34  brain]
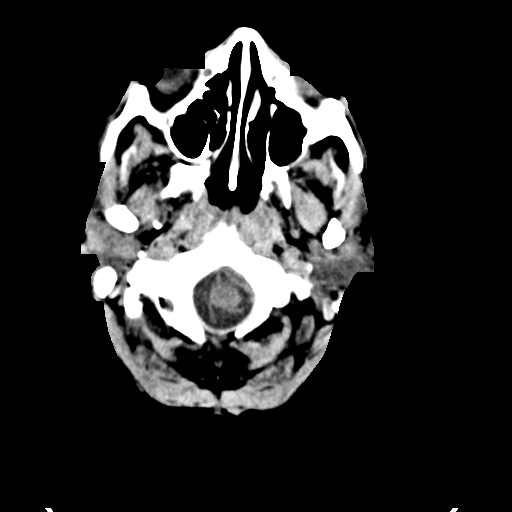
[im 2/34  bone]
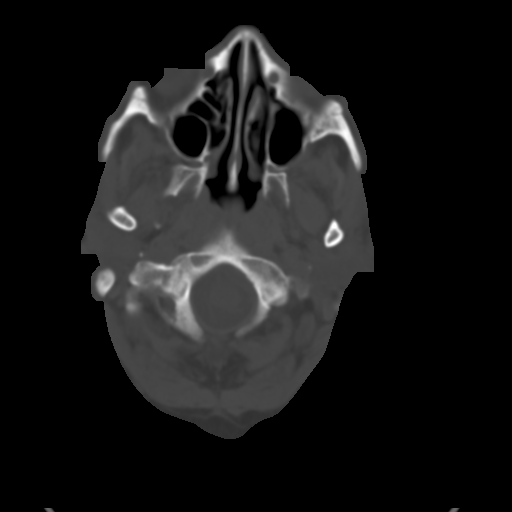
[im 4/34  brain]
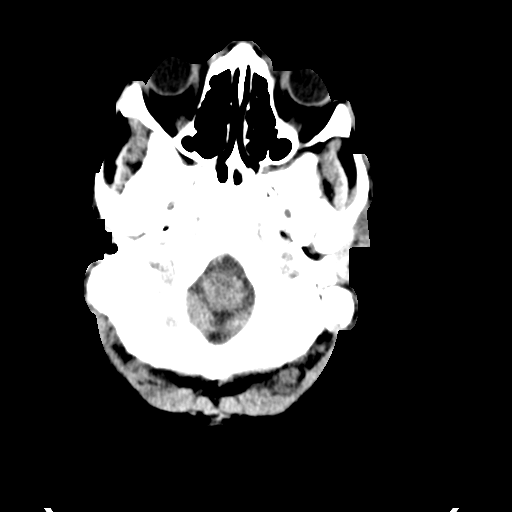
[im 6/34  brain]
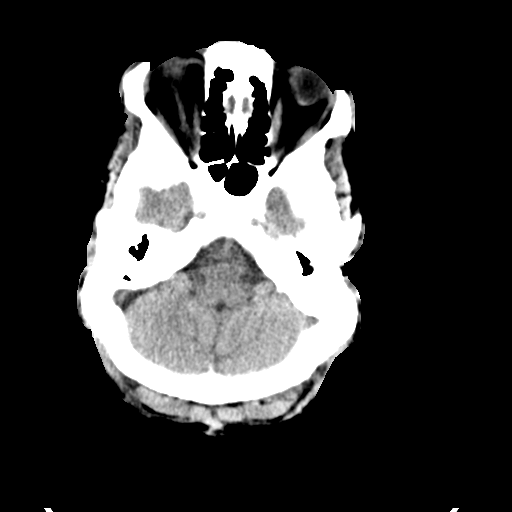
[im 7/34  brain]
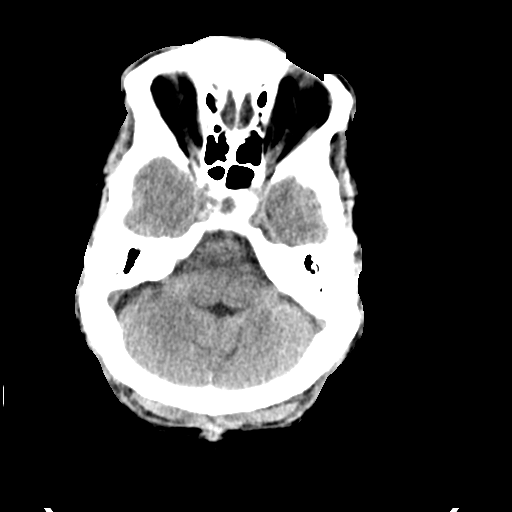
[im 11/34  brain]
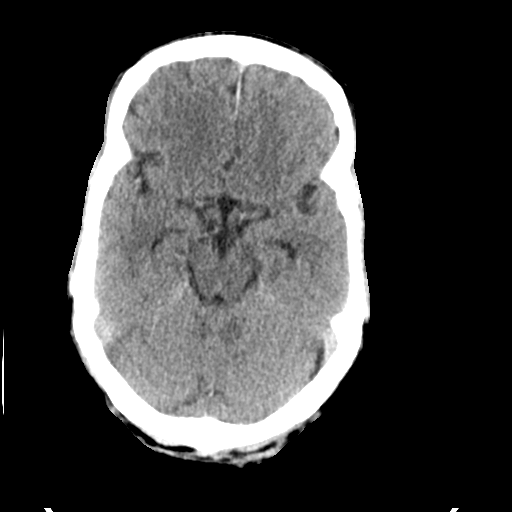
[im 11/34  bone]
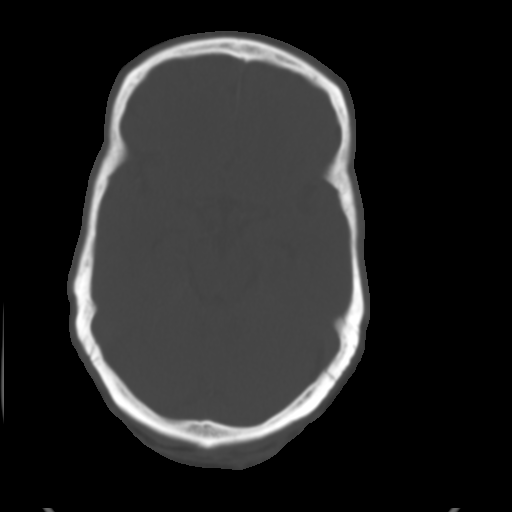
[im 13/34  brain]
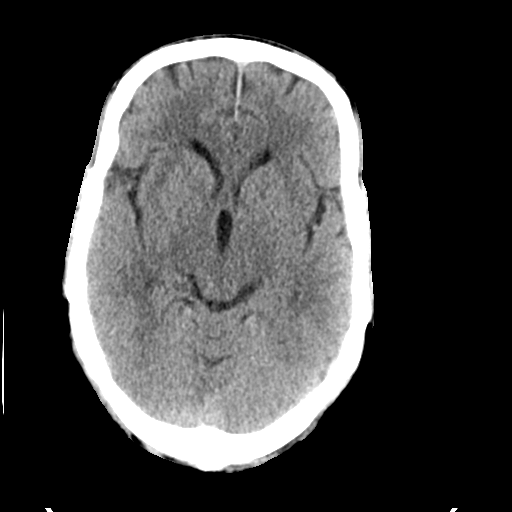
[im 14/34  brain]
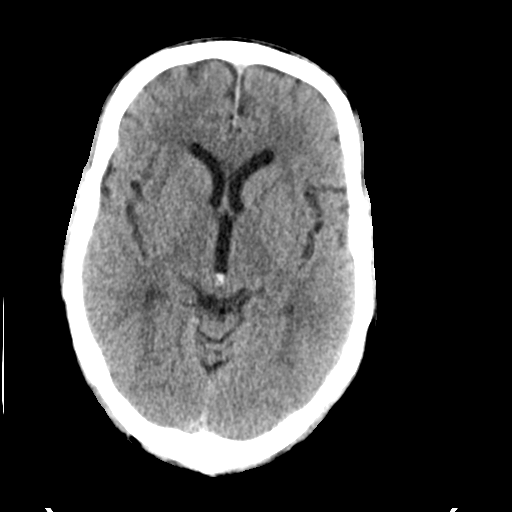
[im 16/34  brain]
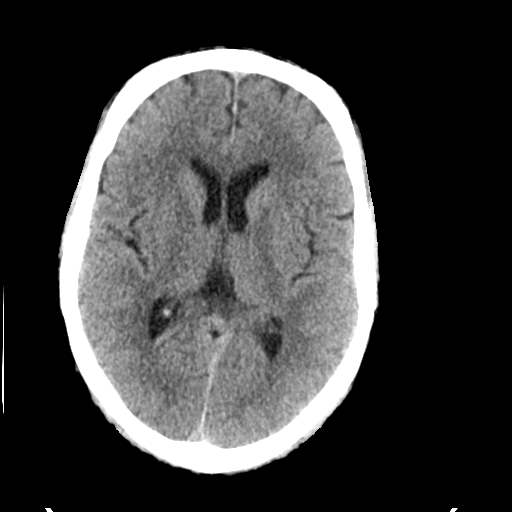
[im 18/34  brain]
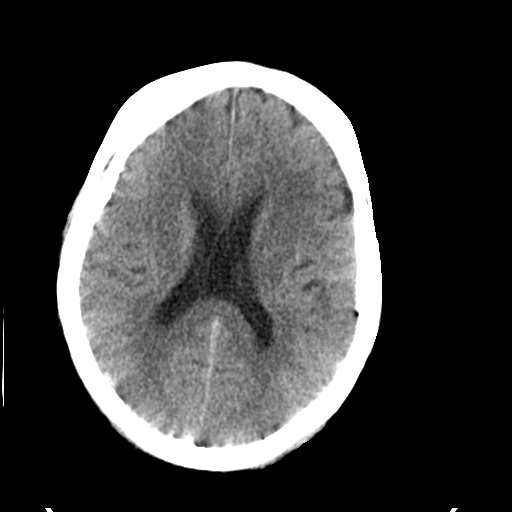
[im 18/34  bone]
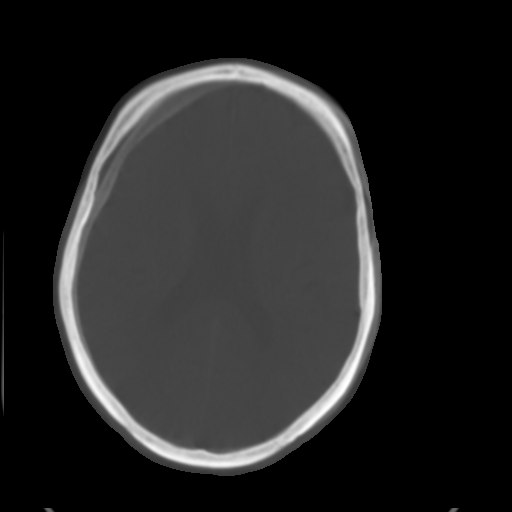
[im 20/34  brain]
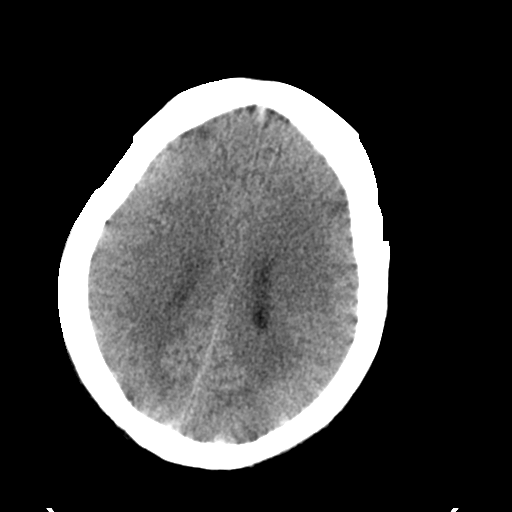
[im 21/34  brain]
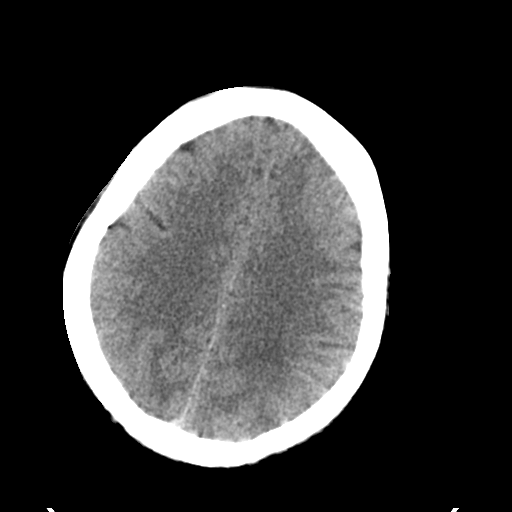
[im 23/34  brain]
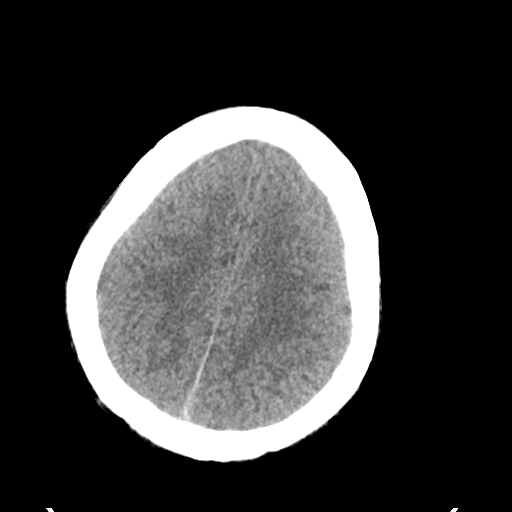
[im 27/34  brain]
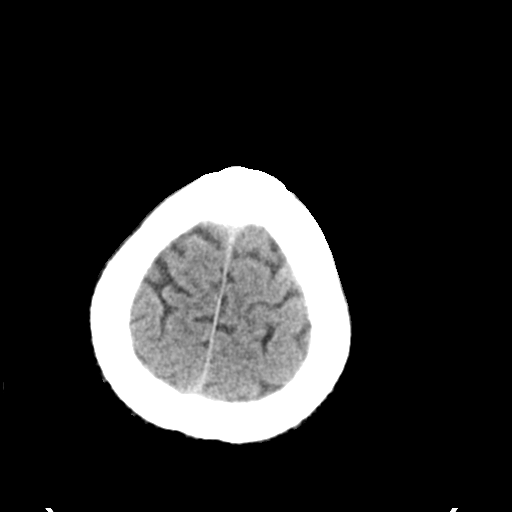
[im 27/34  bone]
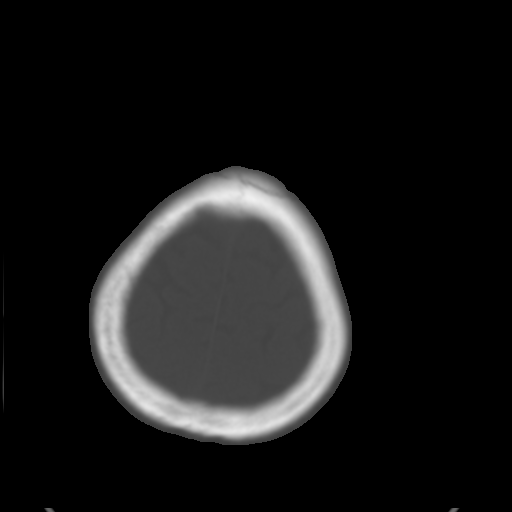
[im 28/34  brain]
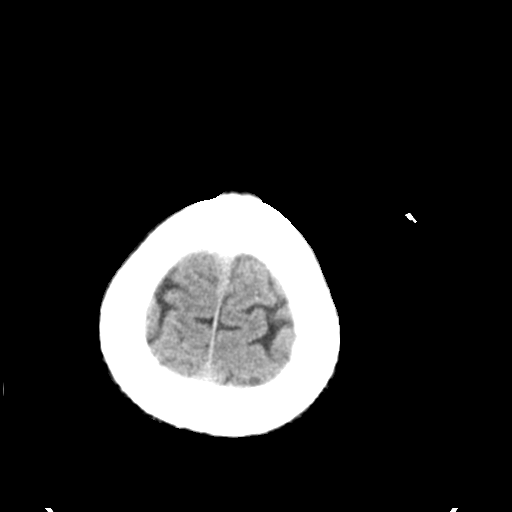
[im 30/34  brain]
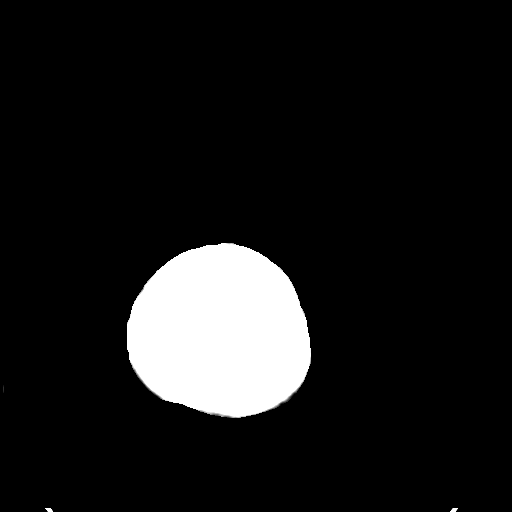
[im 32/34  brain]
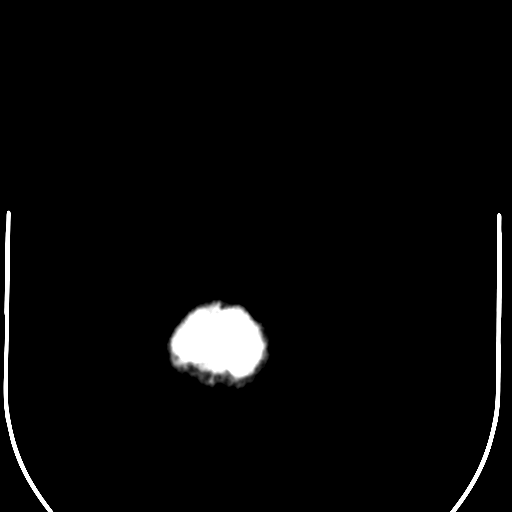

[16 of 30 positions shown; findings below may reference images not displayed]

FINDINGS: No skull fracture is noted. There are motion artifacts. No
intracranial hemorrhage, mass effect or midline shift. Paranasal
sinuses and mastoid air cells are unremarkable.

No acute cortical infarction. No mass lesion is noted on this
unenhanced scan. The gray and white-matter differentiation is
preserved. No intraventricular hemorrhage.
IMPRESSION: No acute intracranial abnormality.  Motion artifacts are noted.

## 2013-07-22 MED ORDER — LORAZEPAM 1 MG PO TABS
1.0000 mg | ORAL_TABLET | Freq: Once | ORAL | Status: AC
  Administered 2013-07-22: 1 mg via ORAL
  Filled 2013-07-22: qty 1

## 2013-07-22 NOTE — ED Provider Notes (Signed)
Chief Complaint:   Chief Complaint  Patient presents with  . Facial Droop    History of Present Illness:   Fred [name] is a 62 year old male retarded male who is essentially nonverbal, stays at a group home, and is brought in today by a group home worker. The group home personnel noticed right facial drooping this morning. The group home worker who is with him right now states he cannot appreciate this. The patient has not had any complaints, although he is not verbal and not able to complain about anything. They have not noticed any weakness of his arm or leg, no difficulty walking or dragging his leg. His speech seems to be about the same. He does not appear to be any distress.  Review of Systems:  Unobtainable since she is nonverbal.  PMFSH:  Past medical history, family history, social history, meds, and allergies were reviewed.  Current meds include Rapaflo, aspirin, Cogentin, hydrochlorothiazide, Procardia, Pamelor, potassium chloride, Risperdal, Zoloft, and Flomax. He has a history of mental retardation, hypertension, and diabetes. He is a current smoker.  Physical Exam:   Vital signs:  BP 167/86  Pulse 82  Temp(Src) 97.6 F (36.4 C) (Oral)  Resp 18  SpO2 97% General:  Alert and in no distress. Eye:  Lids and conjunctivas normal.  PERRL,  Full EOMs.  Fundi were not visualized due to small pupils. ENT:  No cranial or facial tenderness to palpation.  TMs and canals clear.  Nasal mucosa was normal and uncongested without any drainage. No intra oral lesions, pharynx clear, mucous membranes moist, dentition normal. Neck:  Supple, full ROM, no tenderness to palpation.  No adenopathy or mass. No carotid bruit. Lungs: Clear to auscultation. Heart: Regular rhythm, no gallop or murmur. Neuro:  Alert.  Speech was mumbled and nonverbal.  Cranial nerves intact, with no definite facial weakness, although there is some facial asymmetry, this may be his normal. No pronator drift, muscle strength  normal. DTRs were 2+ and symmetrical.Station and gait were normal.    Assessment:  The encounter diagnosis was Facial weakness.  I cannot appreciate any definite stroke symptoms. Apparently group home personnel were able to note some facial weakness. He is not able to cooperate with a neurological examination well. I think in this case a cranial CT would be appropriate. I do not think he would still for an MRI scan.  Plan:  The patient was transferred to the ED via shuttle in stable condition.  Medical Decision Making:  62 year old mentally retarded male who lives at a group home was noted by group home personnel today to have drooping of his right face.  He denies any symptoms, but is non-verbal.  Examination is limited by lack of ability to cooperate with a neuro exam.  I do not appreciate any definate weakness of the face or extremities.  I think a CT would be appropriate in this patient who cannot cooperate well with an exam and probably could not hold still for an MRI.       Reuben Likes, MD 07/22/13 1155

## 2013-07-22 NOTE — ED Provider Notes (Signed)
Laboratory reassuring. No facial droop at this time. Discharged back with caregiver  Juliet Rude. Rubin Payor, MD 07/22/13 2306

## 2013-07-22 NOTE — ED Notes (Signed)
No facial droop noted upon arrival to ED, family notes no abnormailties to patient face, states the droop was reported at the patients adult daycare but face has been noted to be normal for family, family denies any deficits and states patient is acting per normal and face looks per normal. Sent from urgent care for further evaluation.

## 2013-07-22 NOTE — ED Notes (Addendum)
Patient lives in a group home.  Caregiver reports patient went to his adult day activities.  They called the caregiver from the activities site and said patient had a right facial droop.  Caregiver said he looked at patient when he arrived at day location and reports patient looked normal to him and has continued to look normal since then.  Patient has not complained of anything to caregiver.  Not acting, moving or looking any different than usual per caregiver.  Caregiver received call about drooping at 10:20 am.

## 2013-07-22 NOTE — ED Provider Notes (Signed)
CSN: [id number]     Arrival date & time 07/22/13  1202 History   First MD Initiated Contact with Patient 07/22/13 1448     Chief Complaint  Patient presents with  . Facial Droop   (Consider location/radiation/quality/duration/timing/severity/associated sxs/prior Treatment) HPI  62 year old male transferred from urgent care for evaluation of possible facial droop. History and assessment is difficult secondary to patient's history of MR. Patient is currently with his caretaker and history is obtained from him. He was dropped off at an adult day care at approximately 9:00. He was in his usual state of health at time. At approximately 10:20 AM a caretaker with adult daycare noticed what appeared to be drooping of the patient's right face. By the time his primary caretakers came to pick him up, this had appeared to be resolved. Patient is acting at his baseline.  Past Medical History  Diagnosis Date  . Diabetes mellitus   . Hypertension   . Mental retardation    History reviewed. No pertinent past surgical history. No family history on file. History  Substance Use Topics  . Smoking status: Current Every Day Smoker  . Smokeless tobacco: Not on file  . Alcohol Use: No    Review of Systems  Level V caveat applies because patient is MR and essentially nonverbal.  Allergies  Review of patient's allergies indicates no known allergies.  Home Medications   Current Outpatient Rx  Name  Route  Sig  Dispense  Refill  . aspirin EC 81 MG tablet   Oral   Take 81 mg by mouth daily.         . benztropine (COGENTIN) 0.5 MG tablet   Oral   Take 0.5 mg by mouth every morning.          . cholecalciferol (VITAMIN D) 1000 UNITS tablet   Oral   Take 1,000 Units by mouth daily.         . clotrimazole (LOTRIMIN) 1 % cream   Topical   Apply topically 2 (two) times daily as needed. For foot fungus         . econazole nitrate 1 % cream   Topical   Apply 1 application topically 2 (two)  times daily. Apply to foot         . lisinopril-hydrochlorothiazide (PRINZIDE,ZESTORETIC) 20-25 MG per tablet   Oral   Take 1 tablet by mouth daily.         Marland Kitchen loratadine (CLARITIN) 10 MG tablet   Oral   Take 10 mg by mouth every morning.         . nortriptyline (PAMELOR) 25 MG capsule   Oral   Take 25 mg by mouth at bedtime.          . potassium chloride (K-DUR) 10 MEQ tablet   Oral   Take 10 mEq by mouth daily.         . risperiDONE (RISPERDAL M-TABS) 0.5 MG disintegrating tablet   Oral   Take 0.5-3 mg by mouth 2 (two) times daily. Takes 0.5mg  in the morning and 3mg  at night         . risperiDONE (RISPERDAL) 3 MG tablet   Oral   Take 3 mg by mouth at bedtime.         . sertraline (ZOLOFT) 50 MG tablet   Oral   Take 50 mg by mouth daily.         . silodosin (RAPAFLO) 8 MG CAPS capsule   Oral  Take 8 mg by mouth daily with breakfast.         . urea 10 % lotion   Topical   Apply topically 2 (two) times daily as needed. For foot fungus          BP 159/87  Pulse 73  Temp(Src) 97.9 F (36.6 C) (Oral)  Resp 16  Wt 157 lb (71.215 kg)  SpO2 98% Physical Exam  Nursing note reviewed. Constitutional: He appears well-developed and well-nourished. No distress.  HENT:  Head: Normocephalic and atraumatic.  Eyes: Conjunctivae are normal. Right eye exhibits no discharge. Left eye exhibits no discharge.  Neck: Neck supple.  Cardiovascular: Normal rate, regular rhythm and normal heart sounds.  Exam reveals no gallop and no friction rub.   No murmur heard. Pulmonary/Chest: Effort normal and breath sounds normal. No respiratory distress.  Abdominal: Soft. He exhibits no distension. There is no tenderness.  Musculoskeletal: He exhibits no edema and no tenderness.  Neurological: He is alert.  Patient contaminated first name, but not as last name. No facial droop appreciated. Smile symmetric. Tongue midline. Extraocular muscle function is intact. Strength is 5  out of 5 bilateral upper and lower extremities. Patient can angulate unassisted, but gait is somewhat antalgic. His caretaker reports that this is his baseline "bowlegged" gait.  Patient cannot follow more complex instructions to assess finger nose testing.   Skin: Skin is warm and dry.  Psychiatric: He has a normal mood and affect. His behavior is normal. Thought content normal.    ED Course  Procedures (including critical care time) Labs Review Labs Reviewed  GLUCOSE, CAPILLARY - Abnormal; Notable for the following:    Glucose-Capillary 103 (*)    All other components within normal limits   Imaging Review Ct Head Wo Contrast  07/22/2013   CLINICAL DATA:  Right facial droop  EXAM: CT HEAD WITHOUT CONTRAST  TECHNIQUE: Contiguous axial images were obtained from the base of the skull through the vertex without intravenous contrast.  COMPARISON:  02/27/2012  FINDINGS: No skull fracture is noted. There are motion artifacts. No intracranial hemorrhage, mass effect or midline shift. Paranasal sinuses and mastoid air cells are unremarkable.  No acute cortical infarction. No mass lesion is noted on this unenhanced scan. The gray and white-matter differentiation is preserved. No intraventricular hemorrhage.  IMPRESSION: No acute intracranial abnormality.  Motion artifacts are noted.   Electronically Signed   By: Natasha Mead M.D.   On: 07/22/2013 17:06    EKG Interpretation    Date/Time:  Monday July 22 2013 15:46:20 EST Ventricular Rate:  67 PR Interval:    QRS Duration: 93 QT Interval:  413 QTC Calculation: 436 R Axis:   7 Text Interpretation:  Poor data quality Normal sinus rhythm ABNORMAL R WAVE PROGRESSION Non-specific ST-t changes Confirmed by Juleen China  MD, Jabes Primo ([id number]) on 07/22/2013 4:25:34 PM            MDM   1. Facial droop     62 year old male with questionable facial droop earlier which has since resolved. Patient's caretaker has known patient for about a year he has not  notice any unusual appearance or behavior prior to dropping patient off or when taking them off adult daycare. By all indications,  patient currently appears to be at his baseline. Exam and history is limited secondary to his mental retardation. Not convinced that this was a TIA, but will obtain additional testing. EKG shows a sinus rhythm. Patient is hemodynamically stable. Unless CT significant abnormality,  I feel that can he be discharged with his caretaker.  Raeford Razor, MD 07/23/13 [phone]

## 2013-07-22 NOTE — ED Notes (Signed)
Pt is here to have facial droop assessed but caregiver does not see a facial droop.  Pt has MR.  When he smiles there is no droop.  Pt was at a day program when it was noticed

## 2013-09-03 ENCOUNTER — Ambulatory Visit (INDEPENDENT_AMBULATORY_CARE_PROVIDER_SITE_OTHER): Payer: Medicaid Other | Admitting: Podiatry

## 2013-09-03 ENCOUNTER — Encounter: Payer: Self-pay | Admitting: Podiatry

## 2013-09-03 VITALS — BP 142/85 | HR 94 | Resp 20

## 2013-09-03 DIAGNOSIS — M79609 Pain in unspecified limb: Secondary | ICD-10-CM

## 2013-09-03 DIAGNOSIS — B351 Tinea unguium: Secondary | ICD-10-CM

## 2013-09-03 NOTE — Progress Notes (Signed)
   Subjective:    Patient ID: Ardelle LeschesDwain Nachtigal, male    DOB: [birth year], 63 y.o.   MRN: [id number]  HPI Comments: Routine foot care      Review of Systems     Objective:   Physical Exam: Vital signs are stable he is alert and oriented. Pulses are strongly palpable bilateral. His nails are thick yellow dystrophic clinically mycotic.        Assessment & Plan:  Assessment: Pain in limb secondary to onychomycosis 1 through 5 bilateral.  Plan: Debridement of nails 1 through 5 bilateral covered service secondary to pain.

## 2013-11-25 ENCOUNTER — Telehealth: Payer: Self-pay | Admitting: *Deleted

## 2013-11-25 DIAGNOSIS — B353 Tinea pedis: Secondary | ICD-10-CM

## 2013-11-25 MED ORDER — CLOTRIMAZOLE-BETAMETHASONE 1-0.05 % EX CREA: 1.0000 "application " | TOPICAL_CREAM | Freq: Two times a day (BID) | CUTANEOUS | Status: DC

## 2013-11-25 NOTE — Telephone Encounter (Signed)
Can't get his Econazole cream filled, non-approval for payment by Medicaid.  Has to have failed 2 agents before they will approve.  Can you give uas a script on another foot cream or another cream similar to Econazole?  Per Dr. Al CorpusHyatt, order Clotrimazole Cream, that should be covered.  Response was faxed back to her.

## 2014-02-25 ENCOUNTER — Ambulatory Visit: Payer: Medicaid Other | Admitting: Podiatry

## 2014-03-11 ENCOUNTER — Ambulatory Visit (INDEPENDENT_AMBULATORY_CARE_PROVIDER_SITE_OTHER): Payer: Medicaid Other | Admitting: Podiatry

## 2014-03-11 ENCOUNTER — Encounter: Payer: Self-pay | Admitting: Podiatry

## 2014-03-11 DIAGNOSIS — M79609 Pain in unspecified limb: Secondary | ICD-10-CM

## 2014-03-11 DIAGNOSIS — B351 Tinea unguium: Secondary | ICD-10-CM

## 2014-03-11 DIAGNOSIS — M79676 Pain in unspecified toe(s): Secondary | ICD-10-CM

## 2014-03-12 NOTE — Progress Notes (Signed)
She presents today chief complaint of painful elongated toenails.  Objective: Pulses are palpable bilateral. Nails are thick yellow dystrophic with mycotic and painful palpation.  Assessment: Pain in limb secondary to onychomycosis 1 through 5 bilateral.  Plan:

## 2014-06-17 ENCOUNTER — Ambulatory Visit (INDEPENDENT_AMBULATORY_CARE_PROVIDER_SITE_OTHER): Payer: Medicaid Other | Admitting: Podiatry

## 2014-06-17 ENCOUNTER — Encounter: Payer: Self-pay | Admitting: Podiatry

## 2014-06-17 DIAGNOSIS — B351 Tinea unguium: Secondary | ICD-10-CM

## 2014-06-17 DIAGNOSIS — M79676 Pain in unspecified toe(s): Secondary | ICD-10-CM

## 2014-06-17 NOTE — Patient Instructions (Signed)
Diabetes and Foot Care Diabetes may cause you to have problems because of poor blood supply (circulation) to your feet and legs. This may cause the skin on your feet to become thinner, break easier, and heal more slowly. Your skin may become dry, and the skin may peel and crack. You may also have nerve damage in your legs and feet causing decreased feeling in them. You may not notice minor injuries to your feet that could lead to infections or more serious problems. Taking care of your feet is one of the most important things you can do for yourself.  HOME CARE INSTRUCTIONS  Wear shoes at all times, even in the house. Do not go barefoot. Bare feet are easily injured.  Check your feet daily for blisters, cuts, and redness. If you cannot see the bottom of your feet, use a mirror or ask someone for help.  Wash your feet with warm water (do not use hot water) and mild soap. Then pat your feet and the areas between your toes until they are completely dry. Do not soak your feet as this can dry your skin.  Apply a moisturizing lotion or petroleum jelly (that does not contain alcohol and is unscented) to the skin on your feet and to dry, brittle toenails. Do not apply lotion between your toes.  Trim your toenails straight across. Do not dig under them or around the cuticle. File the edges of your nails with an emery board or nail file.  Do not cut corns or calluses or try to remove them with medicine.  Wear clean socks or stockings every day. Make sure they are not too tight. Do not wear knee-high stockings since they may decrease blood flow to your legs.  Wear shoes that fit properly and have enough cushioning. To break in new shoes, wear them for just a few hours a day. This prevents you from injuring your feet. Always look in your shoes before you put them on to be sure there are no objects inside.  Do not cross your legs. This may decrease the blood flow to your feet.  If you find a minor scrape,  cut, or break in the skin on your feet, keep it and the skin around it clean and dry. These areas may be cleansed with mild soap and water. Do not cleanse the area with peroxide, alcohol, or iodine.  When you remove an adhesive bandage, be sure not to damage the skin around it.  If you have a wound, look at it several times a day to make sure it is healing.  Do not use heating pads or hot water bottles. They may burn your skin. If you have lost feeling in your feet or legs, you may not know it is happening until it is too late.  Make sure your health care provider performs a complete foot exam at least annually or more often if you have foot problems. Report any cuts, sores, or bruises to your health care provider immediately. SEEK MEDICAL CARE IF:   You have an injury that is not healing.  You have cuts or breaks in the skin.  You have an ingrown nail.  You notice redness on your legs or feet.  You feel burning or tingling in your legs or feet.  You have pain or cramps in your legs and feet.  Your legs or feet are numb.  Your feet always feel cold. SEEK IMMEDIATE MEDICAL CARE IF:   There is increasing redness,   swelling, or pain in or around a wound.  There is a red line that goes up your leg.  Pus is coming from a wound.  You develop a fever or as directed by your health care provider.  You notice a bad smell coming from an ulcer or wound. Document Released: 08/12/2000 Document Revised: 04/17/2013 Document Reviewed: 01/22/2013 Exit[employer] Patient Information 2015 [employer]. This information is not intended to replace advice given to you by your health care provider. Make sure you discuss any questions you have with your health care provider.  

## 2014-06-17 NOTE — Progress Notes (Signed)
   Subjective:    Patient ID: Fred [name], male    DOB: 1951-03-08, 63 y.o.   MRN: [id number]  HPI Comments: Presents today chief complaint of painful elongated toenails.  Objective: Pulses are palpable bilateral nails are thick, yellow dystrophic onychomycosis and painful palpation.   Assessment: Onychomycosis with pain in limb.  Plan: Treatment of nails in thickness and length as covered service secondary to pain.      Review of Systems     Objective:   Physical Exam        Assessment & Plan:

## 2014-06-27 DIAGNOSIS — N138 Other obstructive and reflux uropathy: Secondary | ICD-10-CM | POA: Insufficient documentation

## 2014-06-27 DIAGNOSIS — N401 Enlarged prostate with lower urinary tract symptoms: Secondary | ICD-10-CM | POA: Insufficient documentation

## 2014-06-29 DIAGNOSIS — R339 Retention of urine, unspecified: Secondary | ICD-10-CM | POA: Insufficient documentation

## 2014-06-29 DIAGNOSIS — R32 Unspecified urinary incontinence: Secondary | ICD-10-CM | POA: Insufficient documentation

## 2014-06-29 DIAGNOSIS — R972 Elevated prostate specific antigen [PSA]: Secondary | ICD-10-CM | POA: Insufficient documentation

## 2014-08-13 ENCOUNTER — Other Ambulatory Visit: Payer: Self-pay | Admitting: *Deleted

## 2014-08-13 NOTE — Telephone Encounter (Signed)
Dr. Maren BeachHyatt okayed a refill request for X-Viate 40% Topical cream, apply twice daily to feet, plus 5 additional refills

## 2014-09-01 ENCOUNTER — Other Ambulatory Visit: Payer: Self-pay | Admitting: *Deleted

## 2014-09-01 DIAGNOSIS — B353 Tinea pedis: Secondary | ICD-10-CM

## 2014-09-01 NOTE — Telephone Encounter (Signed)
Patient's pharmacy was changed [facility].  They requested refills for Clotrimazole/Betamethasone 0.5%-1% and Urea 40%.  Refills were okayed and faxed to [facility].  Urea with 2 refills and Clotrimazole for 5 refills.

## 2014-09-25 ENCOUNTER — Ambulatory Visit (INDEPENDENT_AMBULATORY_CARE_PROVIDER_SITE_OTHER): Payer: Medicaid Other | Admitting: Podiatry

## 2014-09-25 DIAGNOSIS — B351 Tinea unguium: Secondary | ICD-10-CM

## 2014-09-25 DIAGNOSIS — M79676 Pain in unspecified toe(s): Secondary | ICD-10-CM

## 2014-09-25 NOTE — Progress Notes (Signed)
   Subjective:    Patient ID: Fred [name], male    DOB: 12/09/1950, 63 y.o.   MRN: [id number]  HPI Comments: Presents today chief complaint of painful elongated toenails.  Objective: Pulses are palpable bilateral nails are thick, yellow dystrophic onychomycosis and painful palpation.   Assessment: Onychomycosis with pain in limb.  Plan: Treatment of nails in thickness and length as covered service secondary to pain.      Review of Systems     Objective:   Physical Exam        Assessment & Plan:   

## 2014-10-01 ENCOUNTER — Telehealth: Payer: Self-pay | Admitting: *Deleted

## 2014-10-01 NOTE — Telephone Encounter (Signed)
Pharmacist states pt has Medicaid, which will not cover the Urea 40%, but will cover the Urea 40% emulsion.  Dr. Everlena Cooperegal okayed the change.

## 2014-11-28 ENCOUNTER — Other Ambulatory Visit: Payer: Self-pay | Admitting: *Deleted

## 2014-11-28 MED ORDER — UREA 40 % EX EMUL: 1.0000 "application " | Freq: Two times a day (BID) | CUTANEOUS | Status: DC

## 2014-11-28 NOTE — Progress Notes (Signed)
[facility] sent a refill request for Umecta 40% emulsion.  Prescription was okayed with 2 additional refills.

## 2015-01-01 ENCOUNTER — Ambulatory Visit (INDEPENDENT_AMBULATORY_CARE_PROVIDER_SITE_OTHER): Payer: Medicaid Other

## 2015-01-01 DIAGNOSIS — B351 Tinea unguium: Secondary | ICD-10-CM

## 2015-01-01 DIAGNOSIS — M79676 Pain in unspecified toe(s): Secondary | ICD-10-CM | POA: Diagnosis not present

## 2015-01-02 NOTE — Progress Notes (Signed)
   Subjective:    Patient ID: Fred [name], male    DOB: 04/15/1951, 63 y.o.   MRN: [id number]  HPI Comments: Presents today chief complaint of painful elongated toenails.  Objective: Pulses are palpable bilateral nails are thick, yellow dystrophic onychomycosis and painful palpation.   Assessment: Onychomycosis with pain in limb.  Plan: Treatment of nails in thickness and length as covered service secondary to pain.      Review of Systems     Objective:   Physical Exam        Assessment & Plan:   

## 2015-02-24 ENCOUNTER — Telehealth: Payer: Self-pay | Admitting: *Deleted

## 2015-02-24 MED ORDER — UREA 40 % EX EMUL: 1.0000 "application " | Freq: Two times a day (BID) | CUTANEOUS | Status: DC

## 2015-02-24 NOTE — Telephone Encounter (Signed)
[facility] faxed request for [employer] 40% Emulsion refill.  Dr. Al CorpusHyatt order refill as previously and 11 additional.

## 2015-04-02 ENCOUNTER — Encounter: Payer: Self-pay | Admitting: Podiatry

## 2015-04-02 ENCOUNTER — Ambulatory Visit (INDEPENDENT_AMBULATORY_CARE_PROVIDER_SITE_OTHER): Payer: Medicare Other | Admitting: Podiatry

## 2015-04-02 DIAGNOSIS — M79676 Pain in unspecified toe(s): Secondary | ICD-10-CM | POA: Diagnosis not present

## 2015-04-02 DIAGNOSIS — B351 Tinea unguium: Secondary | ICD-10-CM | POA: Diagnosis not present

## 2015-04-02 NOTE — Progress Notes (Signed)
Patient ID: Fred [name], male   DOB: 08/16/1951, 64 y.o.   MRN: [id number] Complaint:  Visit Type: Patient returns to my office for continued preventative foot care services. Complaint: Patient states" my nails have grown long and thick and become painful to walk and wear shoes" .  Patient is diabetic.. The patient presents for preventative foot care services. No changes to ROS  Podiatric Exam: Vascular: dorsalis pedis and posterior tibial pulses are palpable bilateral. Capillary return is immediate. Temperature gradient is WNL. Skin turgor WNL  Sensorium: Normal Semmes Weinstein monofilament test. Normal tactile sensation bilaterally. Nail Exam: Pt has thick disfigured discolored nails with subungual debris noted bilateral entire nail hallux through fifth toenails Ulcer Exam: There is no evidence of ulcer or pre-ulcerative changes or infection. Orthopedic Exam: Muscle tone and strength are WNL. No limitations in general ROM. No crepitus or effusions noted. Foot type and digits show no abnormalities. Bony prominences are unremarkable. Skin: No Porokeratosis. No infection or ulcers  Diagnosis:  Onychomycosis, , Pain in right toe, pain in left toes  Treatment & Plan Procedures and Treatment: Consent by patient was obtained for treatment procedures. The patient understood the discussion of treatment and procedures well. All questions were answered thoroughly reviewed. Debridement of mycotic and hypertrophic toenails, 1 through 5 bilateral and clearing of subungual debris. No ulceration, no infection noted.  Return Visit-Office Procedure: Patient instructed to return to the office for a follow up visit 3 months for continued evaluation and treatment. 

## 2015-04-17 ENCOUNTER — Telehealth: Payer: Self-pay | Admitting: *Deleted

## 2015-04-17 NOTE — Telephone Encounter (Signed)
Faxed request for Risperidone  and Nortriptyline HCL  refaxed to [facility], with notes stating these medications were not prescribed by our doctors.

## 2015-07-02 ENCOUNTER — Ambulatory Visit (INDEPENDENT_AMBULATORY_CARE_PROVIDER_SITE_OTHER): Payer: Medicare Other | Admitting: Podiatry

## 2015-07-02 ENCOUNTER — Encounter: Payer: Self-pay | Admitting: Podiatry

## 2015-07-02 DIAGNOSIS — M79676 Pain in unspecified toe(s): Secondary | ICD-10-CM | POA: Diagnosis not present

## 2015-07-02 DIAGNOSIS — B351 Tinea unguium: Secondary | ICD-10-CM

## 2015-07-02 NOTE — Progress Notes (Signed)
Patient ID: Fred LeschesDwain [name], male   DOB: 09-Apr-1951, 64 y.o.   MRN: [id number] Complaint:  Visit Type: Patient returns to my office for continued preventative foot care services. Complaint: Patient states" my nails have grown long and thick and become painful to walk and wear shoes" .  Patient is diabetic.. The patient presents for preventative foot care services. No changes to ROS  Podiatric Exam: Vascular: dorsalis pedis and posterior tibial pulses are palpable bilateral. Capillary return is immediate. Temperature gradient is WNL. Skin turgor WNL  Sensorium: Normal Semmes Weinstein monofilament test. Normal tactile sensation bilaterally. Nail Exam: Pt has thick disfigured discolored nails with subungual debris noted bilateral entire nail hallux through fifth toenails Ulcer Exam: There is no evidence of ulcer or pre-ulcerative changes or infection. Orthopedic Exam: Muscle tone and strength are WNL. No limitations in general ROM. No crepitus or effusions noted. Foot type and digits show no abnormalities. Bony prominences are unremarkable. Skin: No Porokeratosis. No infection or ulcers  Diagnosis:  Onychomycosis, , Pain in right toe, pain in left toes  Treatment & Plan Procedures and Treatment: Consent by patient was obtained for treatment procedures. The patient understood the discussion of treatment and procedures well. All questions were answered thoroughly reviewed. Debridement of mycotic and hypertrophic toenails, 1 through 5 bilateral and clearing of subungual debris. No ulceration, no infection noted.  Return Visit-Office Procedure: Patient instructed to return to the office for a follow up visit 3 months for continued evaluation and treatment.

## 2015-10-07 ENCOUNTER — Encounter: Payer: Self-pay | Admitting: Podiatry

## 2015-10-07 ENCOUNTER — Ambulatory Visit (INDEPENDENT_AMBULATORY_CARE_PROVIDER_SITE_OTHER): Payer: Medicare Other | Admitting: Podiatry

## 2015-10-07 ENCOUNTER — Ambulatory Visit: Payer: Medicaid Other | Admitting: Podiatry

## 2015-10-07 DIAGNOSIS — M79676 Pain in unspecified toe(s): Secondary | ICD-10-CM

## 2015-10-07 DIAGNOSIS — B351 Tinea unguium: Secondary | ICD-10-CM

## 2015-10-07 DIAGNOSIS — L853 Xerosis cutis: Secondary | ICD-10-CM

## 2015-10-07 NOTE — Progress Notes (Signed)
Patient ID: Fred [name], male   DOB: Aug 21, 1951, 66 y.o.   MRN: [id number]  Subjective: This patient presents with caregiver Earlene Plater present in treatment room for schedule visit for debridement of toenails are all comfortable patient is walking wearing shoes. Patient has been applying Lotrisone cream daily C for multiple years  Objective: Patient has difficulty responding  Vascular: DP and PT pulses 2/4 bilaterally Capillary reflex immediate bilaterally  Neurological: Ankle reflexes equal and reactive bilaterally Vibratory sensation patient not able to respond Cessation 10 g monofilament wire patient unable respond  Dermatological: Scaling skin plantarly bilaterally Toenails are elongated, brittle, deformed and tender to direct palpation 6-10  Assessment: Xerosis bilaterally Symptomatic onychomycoses 6-10  Plan: Recommend DC Lotrisone and substitute all-purpose skin lotion Debridement toenails 6-10 mechanically and electrically without any bleeding  Reappoint 3 months

## 2015-10-07 NOTE — Patient Instructions (Signed)
Today your diabetic foot examination revealed dry skin on the bottom of the right and left feet with a ongoing application of Clotrimazole/betamethasone 0.5%/1%   Discontinue the use of Clotrimazole/betamethasone 0.5%/1% Carrington Clamp, DPM)  Apply all-purpose skin lotion daily to the right and left feet to treat the dry skin  Carrington Clamp, DPM [facility] 10/07/2015  Diabetes and Foot Care Diabetes may cause you to have problems because of poor blood supply (circulation) to your feet and legs. This may cause the skin on your feet to become thinner, break easier, and heal more slowly. Your skin may become dry, and the skin may peel and crack. You may also have nerve damage in your legs and feet causing decreased feeling in them. You may not notice minor injuries to your feet that could lead to infections or more serious problems. Taking care of your feet is one of the most important things you can do for yourself.  HOME CARE INSTRUCTIONS  Wear shoes at all times, even in the house. Do not go barefoot. Bare feet are easily injured.  Check your feet daily for blisters, cuts, and redness. If you cannot see the bottom of your feet, use a mirror or ask someone for help.  Wash your feet with warm water (do not use hot water) and mild soap. Then pat your feet and the areas between your toes until they are completely dry. Do not soak your feet as this can dry your skin.  Apply a moisturizing lotion or petroleum jelly (that does not contain alcohol and is unscented) to the skin on your feet and to dry, brittle toenails. Do not apply lotion between your toes.  Trim your toenails straight across. Do not dig under them or around the cuticle. File the edges of your nails with an emery board or nail file.  Do not cut corns or calluses or try to remove them with medicine.  Wear clean socks or stockings every day. Make sure they are not too tight. Do not wear knee-high stockings since they  may decrease blood flow to your legs.  Wear shoes that fit properly and have enough cushioning. To break in new shoes, wear them for just a few hours a day. This prevents you from injuring your feet. Always look in your shoes before you put them on to be sure there are no objects inside.  Do not cross your legs. This may decrease the blood flow to your feet.  If you find a minor scrape, cut, or break in the skin on your feet, keep it and the skin around it clean and dry. These areas may be cleansed with mild soap and water. Do not cleanse the area with peroxide, alcohol, or iodine.  When you remove an adhesive bandage, be sure not to damage the skin around it.  If you have a wound, look at it several times a day to make sure it is healing.  Do not use heating pads or hot water bottles. They may burn your skin. If you have lost feeling in your feet or legs, you may not know it is happening until it is too late.  Make sure your health care provider performs a complete foot exam at least annually or more often if you have foot problems. Report any cuts, sores, or bruises to your health care provider immediately. SEEK MEDICAL CARE IF:   You have an injury that is not healing.  You have cuts or breaks in  the skin.  You have an ingrown nail.  You notice redness on your legs or feet.  You feel burning or tingling in your legs or feet.  You have pain or cramps in your legs and feet.  Your legs or feet are numb.  Your feet always feel cold. SEEK IMMEDIATE MEDICAL CARE IF:   There is increasing redness, swelling, or pain in or around a wound.  There is a red line that goes up your leg.  Pus is coming from a wound.  You develop a fever or as directed by your health care provider.  You notice a bad smell coming from an ulcer or wound.   This information is not intended to replace advice given to you by your health care provider. Make sure you discuss any questions you have with your  health care provider.   Document Released: 08/12/2000 Document Revised: 04/17/2013 Document Reviewed: 01/22/2013 Elsevier Interactive Patient Education [employer]

## 2015-12-01 ENCOUNTER — Telehealth: Payer: Self-pay | Admitting: *Deleted

## 2015-12-01 NOTE — Telephone Encounter (Signed)
Faxed request for written orders to stop Lotrisone.  10/07/2015 Dr. Leeanne Deeduchman d/c Lotrisone cream and recommended all-purpose skin lotion.  Faxed to [facility].

## 2016-01-05 ENCOUNTER — Ambulatory Visit (INDEPENDENT_AMBULATORY_CARE_PROVIDER_SITE_OTHER): Payer: Medicare Other | Admitting: Podiatry

## 2016-01-05 ENCOUNTER — Encounter: Payer: Self-pay | Admitting: Podiatry

## 2016-01-05 DIAGNOSIS — L853 Xerosis cutis: Secondary | ICD-10-CM

## 2016-01-05 DIAGNOSIS — B351 Tinea unguium: Secondary | ICD-10-CM

## 2016-01-05 DIAGNOSIS — M79676 Pain in unspecified toe(s): Secondary | ICD-10-CM | POA: Diagnosis not present

## 2016-01-05 NOTE — Progress Notes (Signed)
Patient ID: Fred LeschesDwain [name], male   DOB: 08-16-1951, 65 y.o.   MRN: [id number] Complaint:  Visit Type: Patient returns to my office for continued preventative foot care services. Complaint: Patient states" my nails have grown long and thick and become painful to walk and wear shoes" .  Patient is diabetic.. The patient presents for preventative foot care services. No changes to ROS  Podiatric Exam: Vascular: dorsalis pedis and posterior tibial pulses are palpable bilateral. Capillary return is immediate. Temperature gradient is WNL. Skin turgor WNL  Sensorium: Normal Semmes Weinstein monofilament test. Normal tactile sensation bilaterally. Nail Exam: Pt has thick disfigured discolored nails with subungual debris noted bilateral entire nail hallux through fifth toenails Ulcer Exam: There is no evidence of ulcer or pre-ulcerative changes or infection. Orthopedic Exam: Muscle tone and strength are WNL. No limitations in general ROM. No crepitus or effusions noted. Foot type and digits show no abnormalities. Bony prominences are unremarkable. Skin: No Porokeratosis. No infection or ulcers  Diagnosis:  Onychomycosis, , Pain in right toe, pain in left toes  Treatment & Plan Procedures and Treatment: Consent by patient was obtained for treatment procedures. The patient understood the discussion of treatment and procedures well. All questions were answered thoroughly reviewed. Debridement of mycotic and hypertrophic toenails, 1 through 5 bilateral and clearing of subungual debris. No ulceration, no infection noted.  Return Visit-Office Procedure: Patient instructed to return to the office for a follow up visit 3 months for continued evaluation and treatment.   Helane GuntherGregory December Hedtke DPM

## 2016-01-08 ENCOUNTER — Ambulatory Visit: Payer: Medicare Other | Admitting: Podiatry

## 2016-04-12 ENCOUNTER — Ambulatory Visit (INDEPENDENT_AMBULATORY_CARE_PROVIDER_SITE_OTHER): Payer: Medicare Other | Admitting: Podiatry

## 2016-04-12 ENCOUNTER — Encounter: Payer: Self-pay | Admitting: Podiatry

## 2016-04-12 DIAGNOSIS — B351 Tinea unguium: Secondary | ICD-10-CM

## 2016-04-12 DIAGNOSIS — M79676 Pain in unspecified toe(s): Secondary | ICD-10-CM

## 2016-04-12 NOTE — Progress Notes (Signed)
Patient ID: Fred LeschesDwain Ashmore, male   DOB: 09/06/1950, 65 y.o.   MRN: [id number] Complaint:  Visit Type: Patient returns to my office for continued preventative foot care services. Complaint: Patient states" my nails have grown long and thick and become painful to walk and wear shoes" .  Patient is diabetic.. The patient presents for preventative foot care services. No changes to ROS  Podiatric Exam: Vascular: dorsalis pedis and posterior tibial pulses are palpable bilateral. Capillary return is immediate. Temperature gradient is WNL. Skin turgor WNL  Sensorium: Normal Semmes Weinstein monofilament test. Normal tactile sensation bilaterally. Nail Exam: Pt has thick disfigured discolored nails with subungual debris noted bilateral entire nail hallux through fifth toenails Ulcer Exam: There is no evidence of ulcer or pre-ulcerative changes or infection. Orthopedic Exam: Muscle tone and strength are WNL. No limitations in general ROM. No crepitus or effusions noted. Foot type and digits show no abnormalities. Bony prominences are unremarkable. Skin: No Porokeratosis. No infection or ulcers  Diagnosis:  Onychomycosis, , Pain in right toe, pain in left toes  Treatment & Plan Procedures and Treatment: Consent by patient was obtained for treatment procedures. The patient understood the discussion of treatment and procedures well. All questions were answered thoroughly reviewed. Debridement of mycotic and hypertrophic toenails, 1 through 5 bilateral and clearing of subungual debris. No ulceration, no infection noted.  Return Visit-Office Procedure: Patient instructed to return to the office for a follow up visit 3 months for continued evaluation and treatment.   Helane GuntherGregory Zsofia Prout DPM

## 2016-07-12 ENCOUNTER — Ambulatory Visit: Payer: Medicare Other | Admitting: Podiatry

## 2016-07-19 ENCOUNTER — Encounter: Payer: Self-pay | Admitting: Podiatry

## 2016-07-19 ENCOUNTER — Ambulatory Visit (INDEPENDENT_AMBULATORY_CARE_PROVIDER_SITE_OTHER): Payer: Medicare Other | Admitting: Podiatry

## 2016-07-19 DIAGNOSIS — M79676 Pain in unspecified toe(s): Secondary | ICD-10-CM

## 2016-07-19 DIAGNOSIS — L853 Xerosis cutis: Secondary | ICD-10-CM

## 2016-07-19 DIAGNOSIS — B351 Tinea unguium: Secondary | ICD-10-CM

## 2016-07-19 NOTE — Progress Notes (Signed)
Patient ID: Fred [name], male   DOB: 03-02-1951, 65 y.o.   MRN: [id number] Complaint:  Visit Type: Patient returns to my office for continued preventative foot care services. Complaint: Patient states" my nails have grown long and thick and become painful to walk and wear shoes" .  Patient is diabetic.. The patient presents for preventative foot care services. No changes to ROS  Podiatric Exam: Vascular: dorsalis pedis and posterior tibial pulses are palpable bilateral. Capillary return is immediate. Temperature gradient is WNL. Skin turgor WNL  Sensorium: Normal Semmes Weinstein monofilament test. Normal tactile sensation bilaterally. Nail Exam: Pt has thick disfigured discolored nails with subungual debris noted bilateral entire nail hallux through fifth toenails Ulcer Exam: There is no evidence of ulcer or pre-ulcerative changes or infection. Orthopedic Exam: Muscle tone and strength are WNL. No limitations in general ROM. No crepitus or effusions noted. Foot type and digits show no abnormalities. Bony prominences are unremarkable. Skin: No Porokeratosis. No infection or ulcers  Diagnosis:  Onychomycosis, , Pain in right toe, pain in left toes  Treatment & Plan Procedures and Treatment: Consent by patient was obtained for treatment procedures. The patient understood the discussion of treatment and procedures well. All questions were answered thoroughly reviewed. Debridement of mycotic and hypertrophic toenails, 1 through 5 bilateral and clearing of subungual debris. No ulceration, no infection noted.  Patient caregiver was told to stop applying dermisis between his 4/5 toes Return Visit-Office Procedure: Patient instructed to return to the office for a follow up visit 3 months for continued evaluation and treatment.   Helane GuntherGregory Kenni Newton DPM

## 2016-10-25 ENCOUNTER — Encounter: Payer: Self-pay | Admitting: Podiatry

## 2016-10-25 ENCOUNTER — Ambulatory Visit (INDEPENDENT_AMBULATORY_CARE_PROVIDER_SITE_OTHER): Payer: Medicare Other | Admitting: Podiatry

## 2016-10-25 DIAGNOSIS — B351 Tinea unguium: Secondary | ICD-10-CM | POA: Diagnosis not present

## 2016-10-25 DIAGNOSIS — M79676 Pain in unspecified toe(s): Secondary | ICD-10-CM | POA: Diagnosis not present

## 2016-10-25 NOTE — Progress Notes (Signed)
Patient ID: Fred [name], male   DOB: 1951/06/10, 66 y.o.   MRN: [id number] Complaint:  Visit Type: Patient returns to my office for continued preventative foot care services. Complaint: Patient states" my nails have grown long and thick and become painful to walk and wear shoes" .  Patient is diabetic.. The patient presents for preventative foot care services. No changes to ROS  Podiatric Exam: Vascular: dorsalis pedis and posterior tibial pulses are palpable bilateral. Capillary return is immediate. Temperature gradient is WNL. Skin turgor WNL  Sensorium: Normal Semmes Weinstein monofilament test. Normal tactile sensation bilaterally. Nail Exam: Pt has thick disfigured discolored nails with subungual debris noted bilateral entire nail hallux through fifth toenails Ulcer Exam: There is no evidence of ulcer or pre-ulcerative changes or infection. Orthopedic Exam: Muscle tone and strength are WNL. No limitations in general ROM. No crepitus or effusions noted. Foot type and digits show no abnormalities. Bony prominences are unremarkable. Skin: No Porokeratosis. No infection or ulcers.  Skin has improved from previous xerosis.  Diagnosis:  Onychomycosis, , Pain in right toe, pain in left toes  Treatment & Plan Procedures and Treatment: Consent by patient was obtained for treatment procedures. The patient understood the discussion of treatment and procedures well. All questions were answered thoroughly reviewed. Debridement of mycotic and hypertrophic toenails, 1 through 5 bilateral and clearing of subungual debris. No ulceration, no infection noted.   Return Visit-Office Procedure: Patient instructed to return to the office for a follow up visit 3 months for continued evaluation and treatment.   Helane GuntherGregory Taetum Flewellen DPM

## 2017-01-24 ENCOUNTER — Ambulatory Visit: Payer: Medicare Other | Admitting: Podiatry

## 2017-02-09 ENCOUNTER — Encounter: Payer: Self-pay | Admitting: Podiatry

## 2017-02-09 ENCOUNTER — Ambulatory Visit (INDEPENDENT_AMBULATORY_CARE_PROVIDER_SITE_OTHER): Payer: Medicare Other | Admitting: Podiatry

## 2017-02-09 DIAGNOSIS — B351 Tinea unguium: Secondary | ICD-10-CM | POA: Diagnosis not present

## 2017-02-09 DIAGNOSIS — M79676 Pain in unspecified toe(s): Secondary | ICD-10-CM | POA: Diagnosis not present

## 2017-02-09 DIAGNOSIS — L853 Xerosis cutis: Secondary | ICD-10-CM | POA: Diagnosis not present

## 2017-02-09 NOTE — Progress Notes (Signed)
Patient ID: Fred [name], male   DOB: Apr 02, 1951, 66 y.o.   MRN: [id number] Complaint:  Visit Type: Patient returns to my office for continued preventative foot care services. Complaint: Patient states" my nails have grown long and thick and become painful to walk and wear shoes" .  Patient is diabetic.. The patient presents for preventative foot care services. No changes to ROS.  Painful callus right foot.  Podiatric Exam: Vascular: dorsalis pedis and posterior tibial pulses are palpable bilateral. Capillary return is immediate. Temperature gradient is WNL. Skin turgor WNL  Sensorium: Normal Semmes Weinstein monofilament test. Normal tactile sensation bilaterally. Nail Exam: Pt has thick disfigured discolored nails with subungual debris noted bilateral entire nail hallux through fifth toenails Ulcer Exam: There is no evidence of ulcer or pre-ulcerative changes or infection. Orthopedic Exam: Muscle tone and strength are WNL. No limitations in general ROM. No crepitus or effusions noted. Foot type and digits show no abnormalities. Bony prominences are unremarkable. Skin: No Porokeratosis. No infection or ulcers.  Callus medial 1st MPJ  Right foot.  Diagnosis:  Onychomycosis, , Pain in right toe, pain in left toes,  Debride callus.  Treatment & Plan Procedures and Treatment: Consent by patient was obtained for treatment procedures. The patient understood the discussion of treatment and procedures well. All questions were answered thoroughly reviewed. Debridement of mycotic and hypertrophic toenails, 1 through 5 bilateral and clearing of subungual debris. No ulceration, no infection noted.  Return Visit-Office Procedure: Patient instructed to return to the office for a follow up visit 3 months for continued evaluation and treatment.   Helane GuntherGregory Kairo Laubacher DPM

## 2017-05-12 ENCOUNTER — Ambulatory Visit: Payer: Medicare Other | Admitting: Podiatry

## 2017-05-16 ENCOUNTER — Encounter: Payer: Self-pay | Admitting: Podiatry

## 2017-05-16 ENCOUNTER — Ambulatory Visit (INDEPENDENT_AMBULATORY_CARE_PROVIDER_SITE_OTHER): Payer: Medicare Other | Admitting: Podiatry

## 2017-05-16 DIAGNOSIS — Q828 Other specified congenital malformations of skin: Secondary | ICD-10-CM

## 2017-05-16 DIAGNOSIS — B351 Tinea unguium: Secondary | ICD-10-CM | POA: Diagnosis not present

## 2017-05-16 DIAGNOSIS — M79675 Pain in left toe(s): Secondary | ICD-10-CM | POA: Diagnosis not present

## 2017-05-16 DIAGNOSIS — M79674 Pain in right toe(s): Secondary | ICD-10-CM

## 2017-05-16 NOTE — Progress Notes (Signed)
Patient ID: Fred [name], male   DOB: [birth year], 66 y.o.   MRN: [id number] Complaint:  Visit Type: Patient returns to my office for continued preventative foot care services. Complaint: Patient states" my nails have grown long and thick and become painful to walk and wear shoes" .  Patient is diabetic.. The patient presents for preventative foot care services. No changes to ROS.  Painful callus right foot.  Podiatric Exam: Vascular: dorsalis pedis and posterior tibial pulses are palpable bilateral. Capillary return is immediate. Temperature gradient is WNL. Skin turgor WNL  Sensorium: Normal Semmes Weinstein monofilament test. Normal tactile sensation bilaterally. Nail Exam: Pt has thick disfigured discolored nails with subungual debris noted bilateral entire nail hallux through fifth toenails Ulcer Exam: There is no evidence of ulcer or pre-ulcerative changes or infection. Orthopedic Exam: Muscle tone and strength are WNL. No limitations in general ROM. No crepitus or effusions noted. Foot type and digits show no abnormalities. Bony prominences are unremarkable. Skin: No Porokeratosis. No infection or ulcers.  Callus medial 1st MPJ  Right foot.  Diagnosis:  Onychomycosis, , Pain in right toe, pain in left toes,  Debride callus.  Treatment & Plan Procedures and Treatment: Consent by patient was obtained for treatment procedures. The patient understood the discussion of treatment and procedures well. All questions were answered thoroughly reviewed. Debridement of mycotic and hypertrophic toenails, 1 through 5 bilateral and clearing of subungual debris. No ulceration, no infection noted.  Return Visit-Office Procedure: Patient instructed to return to the office for a follow up visit 3 months for continued evaluation and treatment.   Helane Gunther DPM

## 2017-08-06 DIAGNOSIS — D649 Anemia, unspecified: Secondary | ICD-10-CM | POA: Insufficient documentation

## 2017-08-15 ENCOUNTER — Ambulatory Visit: Payer: Medicare Other | Admitting: Podiatry

## 2017-09-01 ENCOUNTER — Ambulatory Visit: Payer: Medicare Other | Admitting: Podiatry

## 2017-10-04 ENCOUNTER — Ambulatory Visit (INDEPENDENT_AMBULATORY_CARE_PROVIDER_SITE_OTHER): Payer: Medicare Other | Admitting: Podiatry

## 2017-10-04 ENCOUNTER — Encounter: Payer: Self-pay | Admitting: Podiatry

## 2017-10-04 DIAGNOSIS — B351 Tinea unguium: Secondary | ICD-10-CM | POA: Diagnosis not present

## 2017-10-04 DIAGNOSIS — M79675 Pain in left toe(s): Secondary | ICD-10-CM | POA: Diagnosis not present

## 2017-10-04 DIAGNOSIS — M79674 Pain in right toe(s): Secondary | ICD-10-CM

## 2017-10-04 NOTE — Progress Notes (Signed)
Patient ID: Fred LeschesDwain [name], male   DOB: 01/20/1951, 67 y.o.   MRN: [id number] Complaint:  Visit Type: Patient returns to my office for continued preventative foot care services. Complaint: Patient states" my nails have grown long and thick and become painful to walk and wear shoes" .  Patient is diabetic.. The patient presents for preventative foot care services. No changes to ROS.  Painful callus right foot.  Podiatric Exam: Vascular: dorsalis pedis and posterior tibial pulses are palpable bilateral. Capillary return is immediate. Temperature gradient is WNL. Skin turgor WNL  Sensorium: Normal Semmes Weinstein monofilament test. Normal tactile sensation bilaterally. Nail Exam: Pt has thick disfigured discolored nails with subungual debris noted bilateral entire nail hallux through fifth toenails Ulcer Exam: There is no evidence of ulcer or pre-ulcerative changes or infection. Orthopedic Exam: Muscle tone and strength are WNL. No limitations in general ROM. No crepitus or effusions noted. Foot type and digits show no abnormalities. Bony prominences are unremarkable. Skin: No Porokeratosis. No infection or ulcers.   Diagnosis:  Onychomycosis, , Pain in right toe, pain in left toes,    Treatment & Plan Procedures and Treatment: Consent by patient was obtained for treatment procedures. The patient understood the discussion of treatment and procedures well. All questions were answered thoroughly reviewed. Debridement of mycotic and hypertrophic toenails, 1 through 5 bilateral and clearing of subungual debris. No ulceration, no infection noted.  Return Visit-Office Procedure: Patient instructed to return to the office for a follow up visit 3 months for continued evaluation and treatment.   Helane GuntherGregory Sheron Robin DPM

## 2018-01-03 ENCOUNTER — Ambulatory Visit (INDEPENDENT_AMBULATORY_CARE_PROVIDER_SITE_OTHER): Payer: Medicare Other | Admitting: Podiatry

## 2018-01-03 ENCOUNTER — Encounter: Payer: Self-pay | Admitting: Podiatry

## 2018-01-03 DIAGNOSIS — B351 Tinea unguium: Secondary | ICD-10-CM | POA: Diagnosis not present

## 2018-01-03 DIAGNOSIS — M79675 Pain in left toe(s): Secondary | ICD-10-CM | POA: Diagnosis not present

## 2018-01-03 DIAGNOSIS — M79674 Pain in right toe(s): Secondary | ICD-10-CM | POA: Diagnosis not present

## 2018-01-03 NOTE — Progress Notes (Signed)
Patient ID: Fred [name], male   DOB: March 11, 1951, 67 y.o.   MRN: [id number] Complaint:  Visit Type: Patient returns to my office for continued preventative foot care services. Complaint: Patient states" my nails have grown long and thick and become painful to walk and wear shoes" .  Patient is diabetic.. The patient presents for preventative foot care services. No changes to ROS.  Painful callus right foot.  Podiatric Exam: Vascular: dorsalis pedis and posterior tibial pulses are palpable bilateral. Capillary return is immediate. Temperature gradient is WNL. Skin turgor WNL  Sensorium: Normal Semmes Weinstein monofilament test. Normal tactile sensation bilaterally. Nail Exam: Pt has thick disfigured discolored nails with subungual debris noted bilateral entire nail hallux through fifth toenails Ulcer Exam: There is no evidence of ulcer or pre-ulcerative changes or infection. Orthopedic Exam: Muscle tone and strength are WNL. No limitations in general ROM. No crepitus or effusions noted. Foot type and digits show no abnormalities. Bony prominences are unremarkable. Skin: No Porokeratosis. No infection or ulcers.   Diagnosis:  Onychomycosis, , Pain in right toe, pain in left toes,    Treatment & Plan Procedures and Treatment: Consent by patient was obtained for treatment procedures. The patient understood the discussion of treatment and procedures well. All questions were answered thoroughly reviewed. Debridement of mycotic and hypertrophic toenails, 1 through 5 bilateral and clearing of subungual debris. No ulceration, no infection noted.  Return Visit-Office Procedure: Patient instructed to return to the office for a follow up visit 3 months for continued evaluation and treatment.   Helane Gunther DPM

## 2018-03-07 ENCOUNTER — Other Ambulatory Visit: Payer: Self-pay | Admitting: Family Medicine

## 2018-03-07 DIAGNOSIS — Z136 Encounter for screening for cardiovascular disorders: Secondary | ICD-10-CM

## 2018-03-29 ENCOUNTER — Ambulatory Visit
Admission: RE | Admit: 2018-03-29 | Discharge: 2018-03-29 | Disposition: A | Discharge status: Home / Self Care | Payer: Medicare Other | Source: Ambulatory Visit | Attending: Family Medicine | Admitting: Family Medicine

## 2018-03-29 DIAGNOSIS — Z136 Encounter for screening for cardiovascular disorders: Secondary | ICD-10-CM

## 2018-03-29 IMAGING — US US ABDOMINAL AORTA SCREENING AAA
1 series · 8 of 8 positions shown · non-contrast
Comparison: None.

CLINICAL DATA: Appropriate age.  First time exam.  Smoking history.

EXAM:
ULTRASOUND OF ABDOMINAL AORTA
TECHNIQUE: Ultrasound examination of the abdominal aorta was performed to
evaluate for abdominal aortic aneurysm.

[Series 1: us abdominal aorta screening aaa · 0.23mm/px · 8 of 8 slices shown]
[im 1/8]
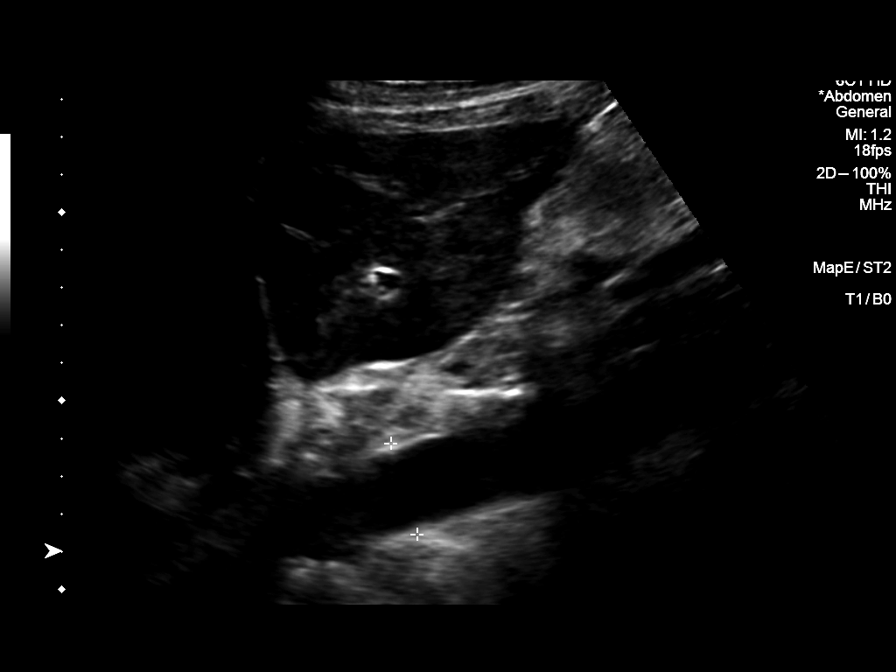
[im 2/8]
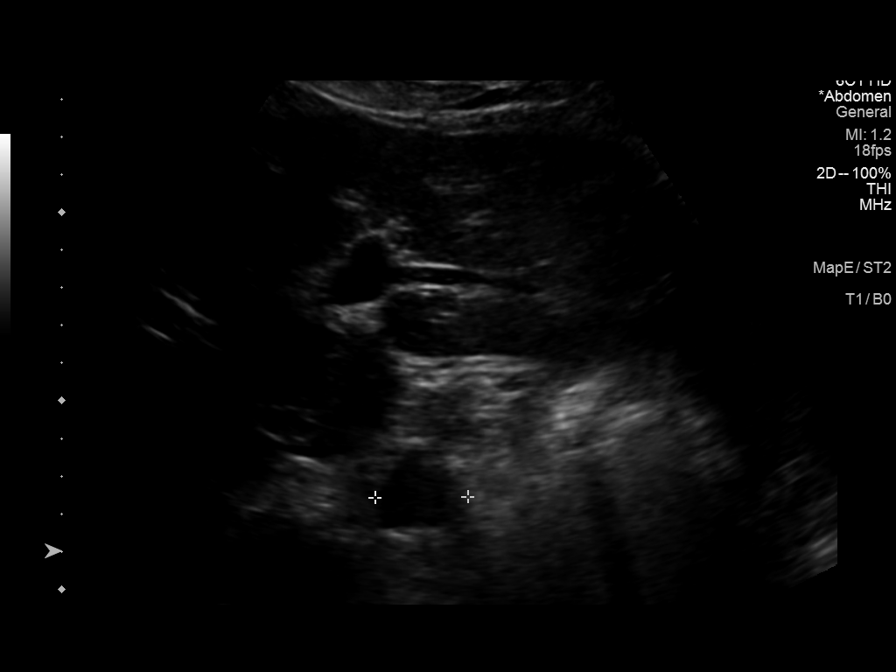
[im 3/8]
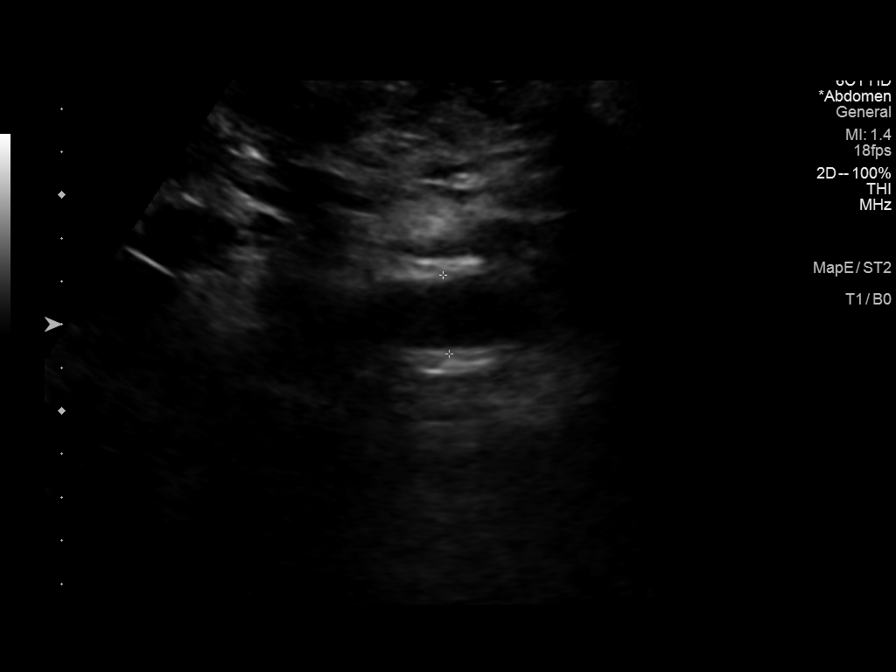
[im 4/8]
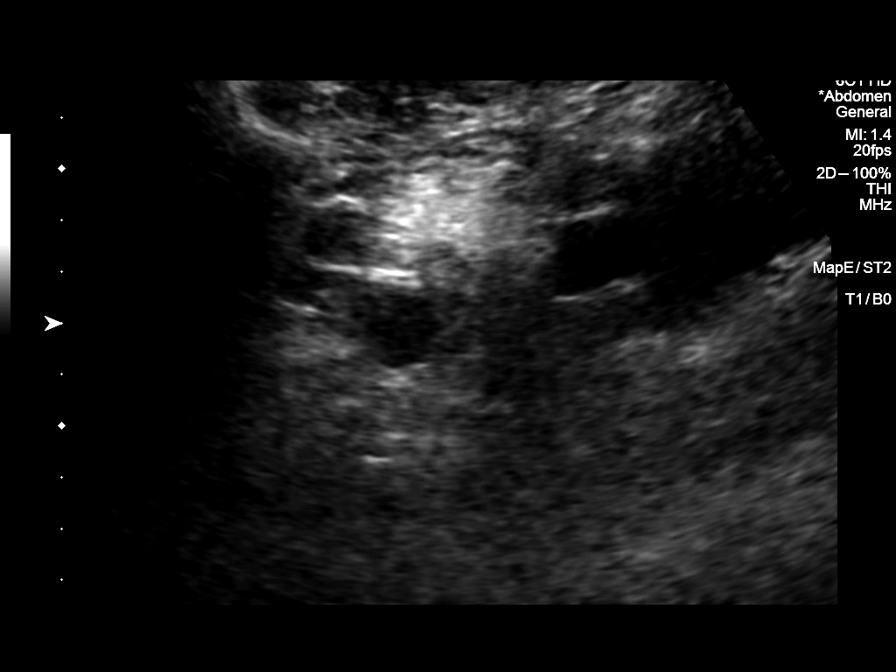
[im 5/8]
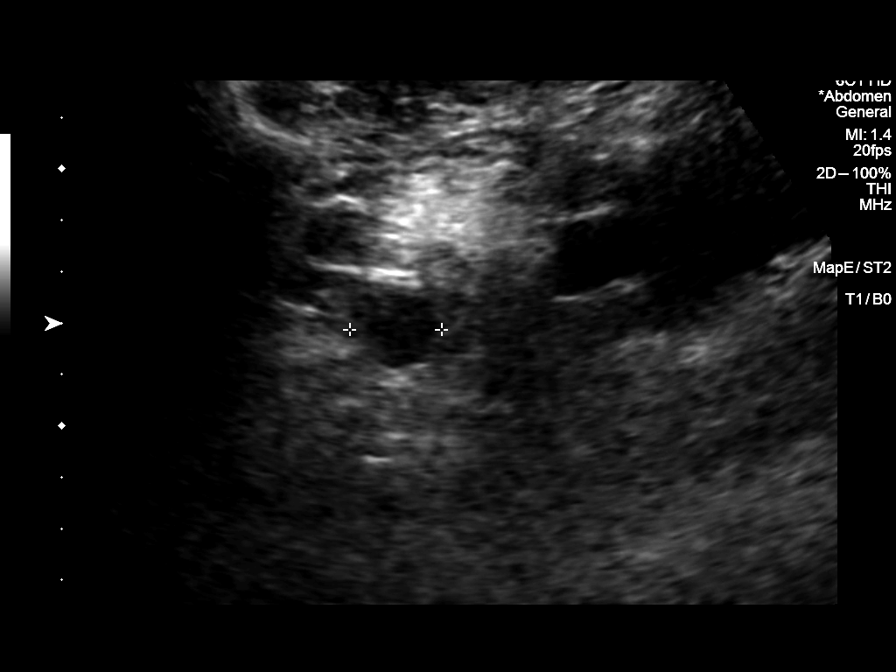
[im 6/8]
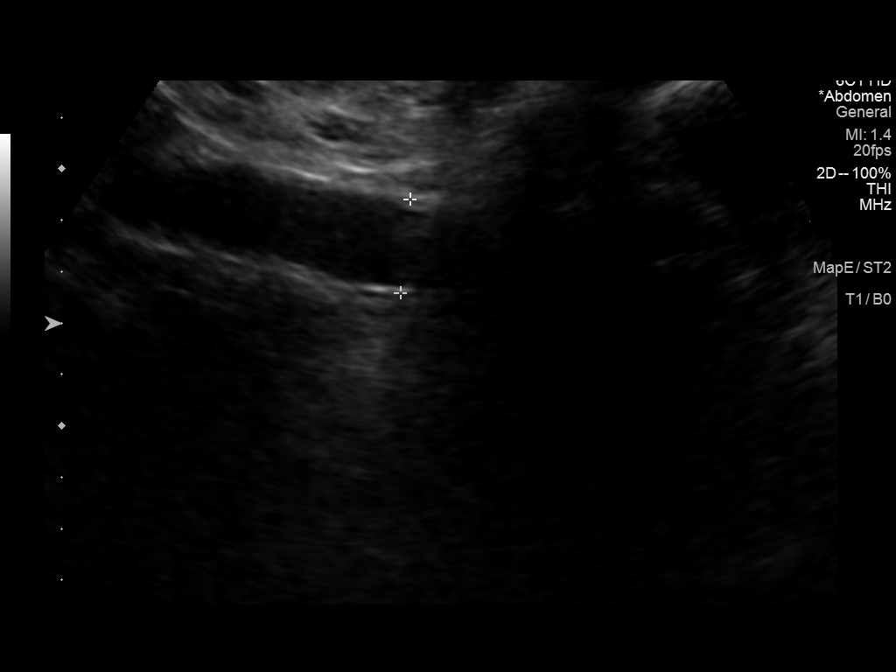
[im 7/8]
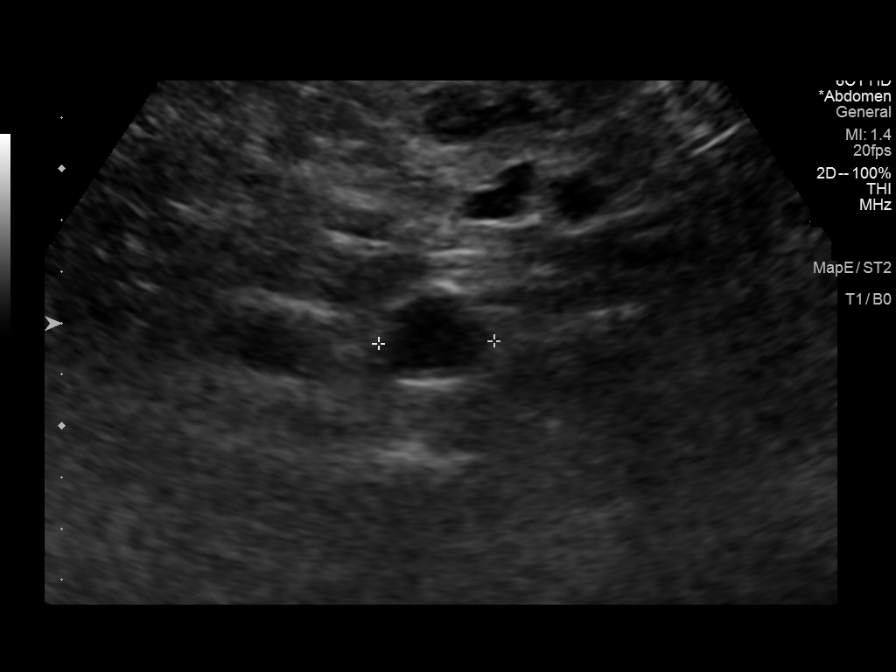
[im 8/8]
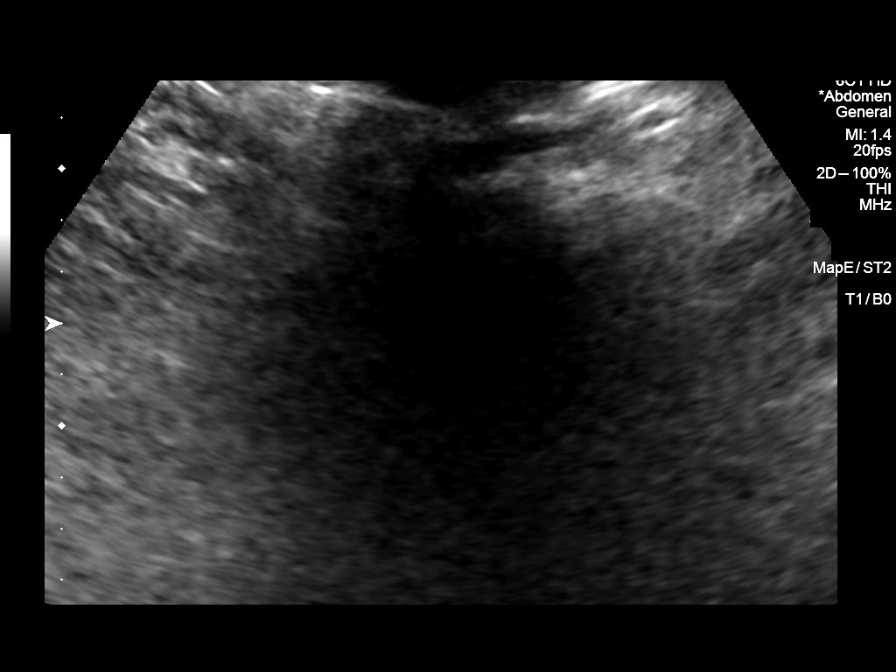

[8 of 8 positions shown; findings below may reference images not displayed]

FINDINGS: Abdominal aortic measurements as follows:

Proximal:  2.5 cm

Mid:  1.8 cm

Distal:  2.3 cm
IMPRESSION: Maximal diameter of the abdominal aorta is 2.5 cm. Ectatic abdominal
aorta at risk for aneurysm development. Recommend followup by
ultrasound in 5 years. This recommendation follows ACR consensus
guidelines: White Paper of the ACR Incidental Findings Committee II

## 2018-04-04 ENCOUNTER — Ambulatory Visit: Payer: Medicare Other | Admitting: Podiatry

## 2018-04-23 DIAGNOSIS — I77811 Abdominal aortic ectasia: Secondary | ICD-10-CM | POA: Insufficient documentation

## 2018-05-11 ENCOUNTER — Ambulatory Visit (INDEPENDENT_AMBULATORY_CARE_PROVIDER_SITE_OTHER): Payer: Medicare Other | Admitting: Podiatry

## 2018-05-11 ENCOUNTER — Encounter: Payer: Self-pay | Admitting: Podiatry

## 2018-05-11 DIAGNOSIS — M79675 Pain in left toe(s): Secondary | ICD-10-CM

## 2018-05-11 DIAGNOSIS — B351 Tinea unguium: Secondary | ICD-10-CM | POA: Diagnosis not present

## 2018-05-11 DIAGNOSIS — E119 Type 2 diabetes mellitus without complications: Secondary | ICD-10-CM

## 2018-05-11 DIAGNOSIS — M79676 Pain in unspecified toe(s): Secondary | ICD-10-CM

## 2018-05-11 DIAGNOSIS — M79674 Pain in right toe(s): Secondary | ICD-10-CM

## 2018-05-11 NOTE — Progress Notes (Signed)
Patient ID: Fred [name], male   DOB: 08/22/1951, 67 y.o.   MRN: [id number] Complaint:  Visit Type: Patient returns to my office for continued preventative foot care services. Complaint: Patient states" my nails have grown long and thick and become painful to walk and wear shoes" .  Patient is diabetic.. The patient presents for preventative foot care services. No changes to ROS.   Podiatric Exam: Vascular: dorsalis pedis and posterior tibial pulses are palpable bilateral. Capillary return is immediate. Temperature gradient is WNL. Skin turgor WNL  Sensorium: Normal Semmes Weinstein monofilament test. Normal tactile sensation bilaterally. Nail Exam: Pt has thick disfigured discolored nails with subungual debris noted bilateral entire nail hallux through fifth toenails Ulcer Exam: There is no evidence of ulcer or pre-ulcerative changes or infection. Orthopedic Exam: Muscle tone and strength are WNL. No limitations in general ROM. No crepitus or effusions noted. Foot type and digits show no abnormalities. Bony prominences are unremarkable. Skin: No Porokeratosis. No infection or ulcers.   Diagnosis:  Onychomycosis, , Pain in right toe, pain in left toes,    Treatment & Plan Procedures and Treatment: Consent by patient was obtained for treatment procedures. The patient understood the discussion of treatment and procedures well. All questions were answered thoroughly reviewed. Debridement of mycotic and hypertrophic toenails, 1 through 5 bilateral and clearing of subungual debris. No ulceration, no infection noted.  Return Visit-Office Procedure: Patient instructed to return to the office for a follow up visit 3 months for continued evaluation and treatment.   Rochanda Harpham DPM 

## 2018-08-10 ENCOUNTER — Encounter: Payer: Self-pay | Admitting: Podiatry

## 2018-08-10 ENCOUNTER — Ambulatory Visit (INDEPENDENT_AMBULATORY_CARE_PROVIDER_SITE_OTHER): Payer: Medicare Other | Admitting: Podiatry

## 2018-08-10 DIAGNOSIS — B351 Tinea unguium: Secondary | ICD-10-CM

## 2018-08-10 DIAGNOSIS — M79674 Pain in right toe(s): Secondary | ICD-10-CM | POA: Diagnosis not present

## 2018-08-10 DIAGNOSIS — M79675 Pain in left toe(s): Secondary | ICD-10-CM

## 2018-08-10 DIAGNOSIS — E119 Type 2 diabetes mellitus without complications: Secondary | ICD-10-CM

## 2018-08-10 NOTE — Progress Notes (Signed)
Patient ID: Fred [name], male   DOB: 01/20/1951, 67 y.o.   MRN: [id number] Complaint:  Visit Type: Patient returns to my office for continued preventative foot care services. Complaint: Patient states" my nails have grown long and thick and become painful to walk and wear shoes" .  Patient is diabetic.. The patient presents for preventative foot care services. No changes to ROS.   Podiatric Exam: Vascular: dorsalis pedis and posterior tibial pulses are palpable bilateral. Capillary return is immediate. Temperature gradient is WNL. Skin turgor WNL  Sensorium: Normal Semmes Weinstein monofilament test. Normal tactile sensation bilaterally. Nail Exam: Pt has thick disfigured discolored nails with subungual debris noted bilateral entire nail hallux through fifth toenails Ulcer Exam: There is no evidence of ulcer or pre-ulcerative changes or infection. Orthopedic Exam: Muscle tone and strength are WNL. No limitations in general ROM. No crepitus or effusions noted. Foot type and digits show no abnormalities. Bony prominences are unremarkable. Skin: No Porokeratosis. No infection or ulcers.   Diagnosis:  Onychomycosis, , Pain in right toe, pain in left toes,    Treatment & Plan Procedures and Treatment: Consent by patient was obtained for treatment procedures. The patient understood the discussion of treatment and procedures well. All questions were answered thoroughly reviewed. Debridement of mycotic and hypertrophic toenails, 1 through 5 bilateral and clearing of subungual debris. No ulceration, no infection noted.  Return Visit-Office Procedure: Patient instructed to return to the office for a follow up visit 3 months for continued evaluation and treatment.   Tannar Broker DPM 

## 2018-11-07 ENCOUNTER — Encounter: Payer: Self-pay | Admitting: Podiatry

## 2018-11-07 ENCOUNTER — Ambulatory Visit (INDEPENDENT_AMBULATORY_CARE_PROVIDER_SITE_OTHER): Payer: Medicare Other | Admitting: Podiatry

## 2018-11-07 DIAGNOSIS — B351 Tinea unguium: Secondary | ICD-10-CM

## 2018-11-07 DIAGNOSIS — M79675 Pain in left toe(s): Secondary | ICD-10-CM | POA: Diagnosis not present

## 2018-11-07 DIAGNOSIS — M79674 Pain in right toe(s): Secondary | ICD-10-CM | POA: Diagnosis not present

## 2018-11-07 DIAGNOSIS — E119 Type 2 diabetes mellitus without complications: Secondary | ICD-10-CM

## 2018-11-07 NOTE — Progress Notes (Signed)
Patient ID: Fred [name], male   DOB: 01/27/1951, 67 y.o.   MRN: [id number] Complaint:  Visit Type: Patient returns to my office for continued preventative foot care services. Complaint: Patient states" my nails have grown long and thick and become painful to walk and wear shoes" .  Patient is diabetic.. The patient presents for preventative foot care services. No changes to ROS.   Podiatric Exam: Vascular: dorsalis pedis and posterior tibial pulses are palpable bilateral. Capillary return is immediate. Temperature gradient is WNL. Skin turgor WNL  Sensorium: Normal Semmes Weinstein monofilament test. Normal tactile sensation bilaterally. Nail Exam: Pt has thick disfigured discolored nails with subungual debris noted bilateral entire nail hallux through fifth toenails Ulcer Exam: There is no evidence of ulcer or pre-ulcerative changes or infection. Orthopedic Exam: Muscle tone and strength are WNL. No limitations in general ROM. No crepitus or effusions noted. Foot type and digits show no abnormalities. Bony prominences are unremarkable. Skin: No Porokeratosis. No infection or ulcers.   Diagnosis:  Onychomycosis, , Pain in right toe, pain in left toes,    Treatment & Plan Procedures and Treatment: Consent by patient was obtained for treatment procedures. The patient understood the discussion of treatment and procedures well. All questions were answered thoroughly reviewed. Debridement of mycotic and hypertrophic toenails, 1 through 5 bilateral and clearing of subungual debris. No ulceration, no infection noted.  Return Visit-Office Procedure: Patient instructed to return to the office for a follow up visit 3 months for continued evaluation and treatment.   [name] DPM 

## 2019-02-08 ENCOUNTER — Other Ambulatory Visit: Payer: Self-pay

## 2019-02-08 ENCOUNTER — Encounter: Payer: Self-pay | Admitting: Podiatry

## 2019-02-08 ENCOUNTER — Ambulatory Visit (INDEPENDENT_AMBULATORY_CARE_PROVIDER_SITE_OTHER): Payer: Medicare Other | Admitting: Podiatry

## 2019-02-08 DIAGNOSIS — B351 Tinea unguium: Secondary | ICD-10-CM | POA: Diagnosis not present

## 2019-02-08 DIAGNOSIS — E119 Type 2 diabetes mellitus without complications: Secondary | ICD-10-CM

## 2019-02-08 DIAGNOSIS — M79674 Pain in right toe(s): Secondary | ICD-10-CM

## 2019-02-08 DIAGNOSIS — M79675 Pain in left toe(s): Secondary | ICD-10-CM | POA: Diagnosis not present

## 2019-02-08 NOTE — Progress Notes (Signed)
Patient ID: Fred [name], male   DOB: 27-May-1951, 68 y.o.   MRN: [id number] Complaint:  Visit Type: Patient returns to my office for continued preventative foot care services. Complaint: Patient states" my nails have grown long and thick and become painful to walk and wear shoes" .  Patient is diabetic.. The patient presents for preventative foot care services. No changes to ROS.   Podiatric Exam: Vascular: dorsalis pedis and posterior tibial pulses are palpable bilateral. Capillary return is immediate. Temperature gradient is WNL. Skin turgor WNL  Sensorium: Normal Semmes Weinstein monofilament test. Normal tactile sensation bilaterally. Nail Exam: Pt has thick disfigured discolored nails with subungual debris noted bilateral entire nail hallux through fifth toenails Ulcer Exam: There is no evidence of ulcer or pre-ulcerative changes or infection. Orthopedic Exam: Muscle tone and strength are WNL. No limitations in general ROM. No crepitus or effusions noted. Foot type and digits show no abnormalities. Bony prominences are unremarkable. Skin: No Porokeratosis. No infection or ulcers.   Diagnosis:  Onychomycosis, , Pain in right toe, pain in left toes,    Treatment & Plan Procedures and Treatment: Consent by patient was obtained for treatment procedures. The patient understood the discussion of treatment and procedures well. All questions were answered thoroughly reviewed. Debridement of mycotic and hypertrophic toenails, 1 through 5 bilateral and clearing of subungual debris. No ulceration, no infection noted.  Return Visit-Office Procedure: Patient instructed to return to the office for a follow up visit 3 months for continued evaluation and treatment.   Gardiner Barefoot DPM

## 2019-05-17 ENCOUNTER — Ambulatory Visit: Payer: Medicare Other | Admitting: Podiatry

## 2019-05-24 ENCOUNTER — Other Ambulatory Visit: Payer: Self-pay

## 2019-05-24 ENCOUNTER — Ambulatory Visit (INDEPENDENT_AMBULATORY_CARE_PROVIDER_SITE_OTHER): Payer: Medicare Other | Admitting: Podiatry

## 2019-05-24 ENCOUNTER — Encounter: Payer: Self-pay | Admitting: Podiatry

## 2019-05-24 DIAGNOSIS — M79674 Pain in right toe(s): Secondary | ICD-10-CM

## 2019-05-24 DIAGNOSIS — M79675 Pain in left toe(s): Secondary | ICD-10-CM

## 2019-05-24 DIAGNOSIS — B351 Tinea unguium: Secondary | ICD-10-CM

## 2019-05-27 NOTE — Progress Notes (Signed)
Subjective:  Patient ID: Fred [name], male    DOB: 08/25/1951,  MRN: [id number]  Chief Complaint  Patient presents with  . Nail Problem    trim   68 y.o. male presents with the above complaint.  Reports painfully elongated nails to both feet. Unable to care for them himself. Well controlled DM. Review of Systems: Negative except as noted in the HPI. Denies N/V/F/Ch.  Past Medical History:  Diagnosis Date  . Diabetes mellitus   . Hypertension   . Mental retardation     Current Outpatient Medications:  .  acetaminophen (TYLENOL) 325 MG tablet, Take by mouth., Disp: , Rfl:  .  amLODipine (NORVASC) 10 MG tablet, , Disp: , Rfl:  .  amlodipine-atorvastatin (CADUET) 10-10 MG tablet, Take by mouth., Disp: , Rfl:  .  aspirin EC 81 MG tablet, Take 81 mg by mouth daily., Disp: , Rfl:  .  atorvastatin (LIPITOR) 20 MG tablet, , Disp: , Rfl:  .  benzocaine (ORAJEL) 10 % mucosal gel, Use as directed in the mouth or throat as needed for Pain., Disp: , Rfl:  .  benztropine (COGENTIN) 0.5 MG tablet, Take 0.5 mg by mouth every morning. , Disp: , Rfl:  .  cholecalciferol (VITAMIN D) 1000 UNITS tablet, Take 1,000 Units by mouth daily., Disp: , Rfl:  .  clotrimazole (LOTRIMIN) 1 % cream, Apply topically 2 (two) times daily as needed. For foot fungus, Disp: , Rfl:  .  clotrimazole-betamethasone (LOTRISONE) cream, Apply 1 application topically 2 (two) times daily., Disp: 30 g, Rfl: 0 .  econazole nitrate 1 % cream, Apply 1 application topically 2 (two) times daily. Apply to foot, Disp: , Rfl:  .  ferrous sulfate 325 (65 FE) MG tablet, Take by mouth., Disp: , Rfl:  .  glucose monitoring kit (FREESTYLE) monitoring kit, Please dispense whichever glucometer is covered by pt's insurance with lancets and strips also; check blood sugar as directed, Disp: , Rfl:  .  Lancets Misc. (ACCU-CHEK SOFTCLIX LANCET DEV) KIT, by Does not apply route., Disp: , Rfl:  .  lisinopril-hydrochlorothiazide (PRINZIDE,ZESTORETIC)  20-25 MG per tablet, Take 1 tablet by mouth daily., Disp: , Rfl:  .  loratadine (CLARITIN) 10 MG tablet, Take 10 mg by mouth every morning., Disp: , Rfl:  .  metFORMIN (GLUCOPHAGE) 500 MG tablet, take ONE TABLET BY MOUTH TWICE DAILY WITH MEALS, Disp: , Rfl:  .  NIFEdipine (ADALAT CC) 90 MG 24 hr tablet, Take by mouth., Disp: , Rfl:  .  nortriptyline (PAMELOR) 25 MG capsule, Take 25 mg by mouth at bedtime. , Disp: , Rfl:  .  potassium chloride (K-DUR) 10 MEQ tablet, Take 10 mEq by mouth daily., Disp: , Rfl:  .  risperiDONE (RISPERDAL) 3 MG tablet, , Disp: , Rfl:  .  sertraline (ZOLOFT) 50 MG tablet, Take 50 mg by mouth daily., Disp: , Rfl:  .  silodosin (RAPAFLO) 8 MG CAPS capsule, Take 8 mg by mouth daily with breakfast., Disp: , Rfl:  .  Urea (UMECTA) 40 % EMUL, Apply 1 application topically 2 (two) times daily., Disp: 120 g, Rfl: 11 .  carvedilol (COREG) 12.5 MG tablet, Take 12.5 mg by mouth., Disp: , Rfl:   Social History   Tobacco Use  Smoking Status Current Every Day Smoker  Smokeless Tobacco Never Used    No Known Allergies Objective:  There were no vitals filed for this visit. There is no height or weight on file to calculate BMI. Constitutional Well developed. Well  nourished.  Vascular Dorsalis pedis pulses palpable bilaterally. Posterior tibial pulses palpable bilaterally. Capillary refill normal to all digits.  No cyanosis or clubbing noted. Pedal hair growth normal.  Neurologic Normal speech. Oriented to person, place, and time. Epicritic sensation to light touch grossly present bilaterally.  Dermatologic Nails elongated dystrophic pain to palpation No open wounds. No skin lesions.  Orthopedic: Normal joint ROM without pain or crepitus bilaterally. No visible deformities. No bony tenderness.   Radiographs: None Assessment:   1. Pain due to onychomycosis of toenails of both feet    Plan:  Patient was evaluated and treated and all questions answered.   Onychomycosis with pain -Nails palliatively debridement as below -Educated on self-care  Procedure: Nail Debridement Rationale: Pain Type of Debridement: manual, sharp debridement. Instrumentation: Nail nipper, rotary burr. Number of Nails: 10    No follow-ups on file.

## 2019-12-12 ENCOUNTER — Other Ambulatory Visit: Payer: Self-pay

## 2019-12-12 ENCOUNTER — Ambulatory Visit (INDEPENDENT_AMBULATORY_CARE_PROVIDER_SITE_OTHER): Payer: Medicare Other | Admitting: Podiatry

## 2019-12-12 VITALS — Temp 97.7°F

## 2019-12-12 DIAGNOSIS — B351 Tinea unguium: Secondary | ICD-10-CM

## 2019-12-12 DIAGNOSIS — M79674 Pain in right toe(s): Secondary | ICD-10-CM

## 2019-12-12 DIAGNOSIS — M79675 Pain in left toe(s): Secondary | ICD-10-CM | POA: Diagnosis not present

## 2019-12-12 DIAGNOSIS — E119 Type 2 diabetes mellitus without complications: Secondary | ICD-10-CM

## 2019-12-12 NOTE — Progress Notes (Signed)
  Subjective:  Patient ID: Fred [name], male    DOB: 11-10-1950,  MRN: [id number]  Chief Complaint  Patient presents with  . Nail Problem    Thick, long toenails - bilateral, 1-5.  . Diabetes    Most recent HgbA1c per pt = 6.1.   69 y.o. male presents with the above complaint. History confirmed with patient.  Reports pain with the toenails  Objective:  Physical Exam: warm, good capillary refill, nail exam onychomycosis of the toenails, no trophic changes or ulcerative lesions. DP pulses palpable, PT pulses palpable and protective sensation intact  No images are attached to the encounter.  Assessment:   1. Type 2 diabetes mellitus without complication, without long-term current use of insulin (HCC)   2. Pain due to onychomycosis of toenails of both feet      Plan:  Patient was evaluated and treated and all questions answered.  Onychomycosis and Diabetes -Nails palliatively debrided secondary to pain  No follow-ups on file.

## 2020-03-12 ENCOUNTER — Ambulatory Visit: Payer: Medicare Other | Admitting: Podiatry

## 2020-03-12 ENCOUNTER — Other Ambulatory Visit: Payer: Self-pay

## 2020-04-14 ENCOUNTER — Other Ambulatory Visit: Payer: Self-pay

## 2020-04-14 ENCOUNTER — Ambulatory Visit (INDEPENDENT_AMBULATORY_CARE_PROVIDER_SITE_OTHER): Payer: Medicare Other | Admitting: Podiatry

## 2020-04-14 DIAGNOSIS — E119 Type 2 diabetes mellitus without complications: Secondary | ICD-10-CM

## 2020-04-14 DIAGNOSIS — M79674 Pain in right toe(s): Secondary | ICD-10-CM | POA: Diagnosis not present

## 2020-04-14 DIAGNOSIS — B351 Tinea unguium: Secondary | ICD-10-CM

## 2020-04-14 DIAGNOSIS — M79675 Pain in left toe(s): Secondary | ICD-10-CM

## 2020-04-14 NOTE — Progress Notes (Signed)
°  Subjective:  Patient ID: Fred [name], male    DOB: 08/07/1951,  MRN: [id number]  No chief complaint on file.  69 y.o. male presents with the above complaint. History confirmed with patient.   Objective:  Physical Exam: warm, good capillary refill, nail exam onychomycosis of the toenails, no trophic changes or ulcerative lesions. DP pulses palpable, PT pulses palpable and protective sensation intact Left Foot: normal exam, no swelling, tenderness, instability; ligaments intact, full range of motion of all ankle/foot joints  Right Foot: normal exam, no swelling, tenderness, instability; ligaments intact, full range of motion of all ankle/foot joints   No images are attached to the encounter.  Assessment:   1. Type 2 diabetes mellitus without complication, without long-term current use of insulin (HCC)   2. Pain due to onychomycosis of toenails of both feet    Plan:  Patient was evaluated and treated and all questions answered.  Onychomycosis and Diabetes -Nails palliatively debrided secondary to pain  Procedure: Nail Debridement Rationale:  DM, pain Type of Debridement: manual, sharp debridement. Instrumentation: Nail nipper, rotary burr. Number of Nails: 10  Return in about 4 months (around 08/14/2020) for Diabetic Foot Care.

## 2020-04-19 ENCOUNTER — Other Ambulatory Visit: Payer: Self-pay

## 2020-04-19 ENCOUNTER — Encounter (HOSPITAL_COMMUNITY): Payer: Self-pay | Admitting: Emergency Medicine

## 2020-04-19 ENCOUNTER — Emergency Department (HOSPITAL_COMMUNITY)
Admission: EM | Admit: 2020-04-19 | Discharge: 2020-04-20 | Disposition: A | Discharge status: Home / Self Care | Payer: Medicare Other | Attending: Emergency Medicine | Admitting: Emergency Medicine

## 2020-04-19 ENCOUNTER — Emergency Department (HOSPITAL_COMMUNITY): Payer: Medicare Other

## 2020-04-19 DIAGNOSIS — F17210 Nicotine dependence, cigarettes, uncomplicated: Secondary | ICD-10-CM | POA: Diagnosis not present

## 2020-04-19 DIAGNOSIS — G540 Brachial plexus disorders: Secondary | ICD-10-CM

## 2020-04-19 DIAGNOSIS — E119 Type 2 diabetes mellitus without complications: Secondary | ICD-10-CM | POA: Insufficient documentation

## 2020-04-19 DIAGNOSIS — Z7984 Long term (current) use of oral hypoglycemic drugs: Secondary | ICD-10-CM | POA: Insufficient documentation

## 2020-04-19 DIAGNOSIS — I10 Essential (primary) hypertension: Secondary | ICD-10-CM | POA: Insufficient documentation

## 2020-04-19 DIAGNOSIS — Z7982 Long term (current) use of aspirin: Secondary | ICD-10-CM | POA: Diagnosis not present

## 2020-04-19 DIAGNOSIS — Z79899 Other long term (current) drug therapy: Secondary | ICD-10-CM | POA: Insufficient documentation

## 2020-04-19 DIAGNOSIS — R200 Anesthesia of skin: Secondary | ICD-10-CM | POA: Diagnosis present

## 2020-04-19 LAB — COMPREHENSIVE METABOLIC PANEL
ALT: 18 U/L (ref 0–44)
AST: 14 U/L — ABNORMAL LOW (ref 15–41)
Albumin: 4 g/dL (ref 3.5–5.0)
Alkaline Phosphatase: 63 U/L (ref 38–126)
Anion gap: 11 (ref 5–15)
BUN: 14 mg/dL (ref 8–23)
CO2: 28 mmol/L (ref 22–32)
Calcium: 9.6 mg/dL (ref 8.9–10.3)
Chloride: 103 mmol/L (ref 98–111)
Creatinine, Ser: 1.06 mg/dL (ref 0.61–1.24)
GFR calc Af Amer: >60 mL/min (ref >60)
GFR calc non Af Amer: >60 mL/min (ref >60)
Glucose, Bld: 116 mg/dL — ABNORMAL HIGH (ref 70–99)
Potassium: 3.2 mmol/L — ABNORMAL LOW (ref 3.5–5.1)
Sodium: 142 mmol/L (ref 135–145)
Total Bilirubin: 0.2 mg/dL — ABNORMAL LOW (ref 0.3–1.2)
Total Protein: 7.4 g/dL (ref 6.5–8.1)

## 2020-04-19 LAB — CBC
HCT: 38.3 % — ABNORMAL LOW (ref 39.0–52.0)
Hemoglobin: 12.1 g/dL — ABNORMAL LOW (ref 13.0–17.0)
MCH: 29 pg (ref 26.0–34.0)
MCHC: 31.6 g/dL (ref 30.0–36.0)
MCV: 91.8 fL (ref 80.0–100.0)
Platelets: 162 10*3/uL (ref 150–400)
RBC: 4.17 MIL/uL — ABNORMAL LOW (ref 4.22–5.81)
RDW: 14.1 % (ref 11.5–15.5)
WBC: 6.2 10*3/uL (ref 4.0–10.5)
nRBC: 0 % (ref 0.0–0.2)

## 2020-04-19 LAB — PROTIME-INR
INR: 1.2 (ref 0.8–1.2)
Prothrombin Time: 14.5 seconds (ref 11.4–15.2)

## 2020-04-19 LAB — I-STAT CHEM 8, ED
BUN: 17 mg/dL (ref 8–23)
Calcium, Ion: 1.18 mmol/L (ref 1.15–1.40)
Chloride: 102 mmol/L (ref 98–111)
Creatinine, Ser: 1 mg/dL (ref 0.61–1.24)
Glucose, Bld: 109 mg/dL — ABNORMAL HIGH (ref 70–99)
HCT: 37 % — ABNORMAL LOW (ref 39.0–52.0)
Hemoglobin: 12.6 g/dL — ABNORMAL LOW (ref 13.0–17.0)
Potassium: 3.2 mmol/L — ABNORMAL LOW (ref 3.5–5.1)
Sodium: 143 mmol/L (ref 135–145)
TCO2: 28 mmol/L (ref 22–32)

## 2020-04-19 LAB — CBG MONITORING, ED: Glucose-Capillary: 102 mg/dL — ABNORMAL HIGH (ref 70–99)

## 2020-04-19 LAB — DIFFERENTIAL
Abs Immature Granulocytes: 0.03 10*3/uL (ref 0.00–0.07)
Basophils Absolute: 0 10*3/uL (ref 0.0–0.1)
Basophils Relative: 1 %
Eosinophils Absolute: 0.1 10*3/uL (ref 0.0–0.5)
Eosinophils Relative: 1 %
Immature Granulocytes: 1 %
Lymphocytes Relative: 22 %
Lymphs Abs: 1.4 10*3/uL (ref 0.7–4.0)
Monocytes Absolute: 0.5 10*3/uL (ref 0.1–1.0)
Monocytes Relative: 8 %
Neutro Abs: 4.2 10*3/uL (ref 1.7–7.7)
Neutrophils Relative %: 67 %

## 2020-04-19 LAB — APTT: aPTT: 30 seconds (ref 24–36)

## 2020-04-19 IMAGING — CT CT HEAD W/O CM
3 series · 14 of 47 positions shown, 16 images · non-contrast
Comparison: [DATE]

CLINICAL DATA: Left arm numbness since 6 p.m.

EXAM:
CT HEAD WITHOUT CONTRAST
TECHNIQUE: Contiguous axial images were obtained from the base of the skull
through the vertex without intravenous contrast.

[Series 3: head 5.0 h30s · axial · 0.46mm/px · z∈[+1367,+1497]mm · 8 of 32 slices shown, 10 images]
[im 3/32  brain]
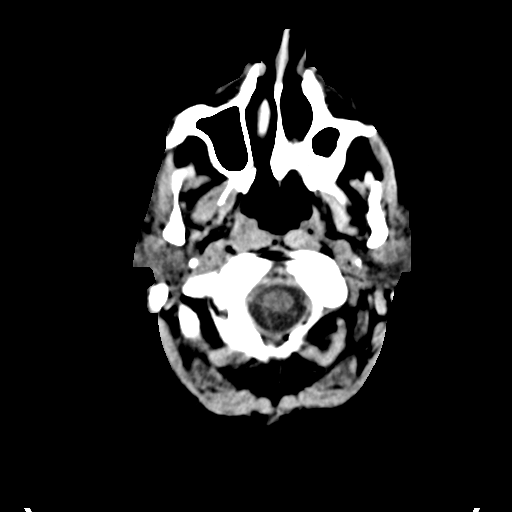
[im 3/32  bone]
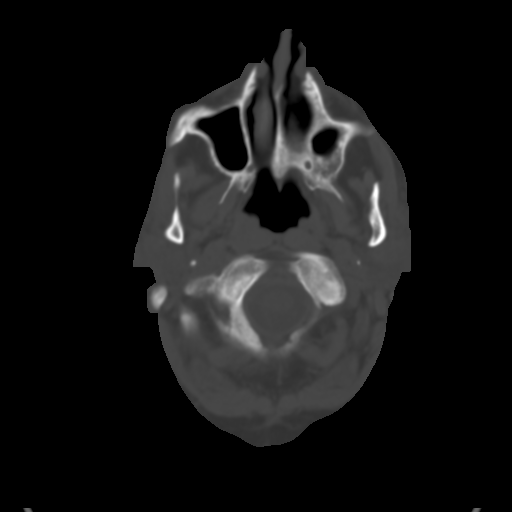
[im 7/32  brain]
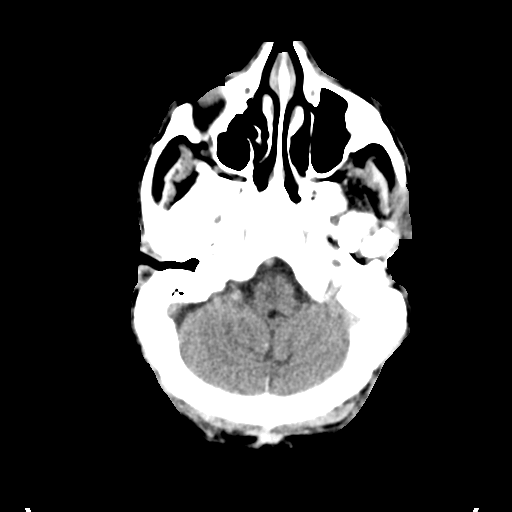
[im 10/32  brain]
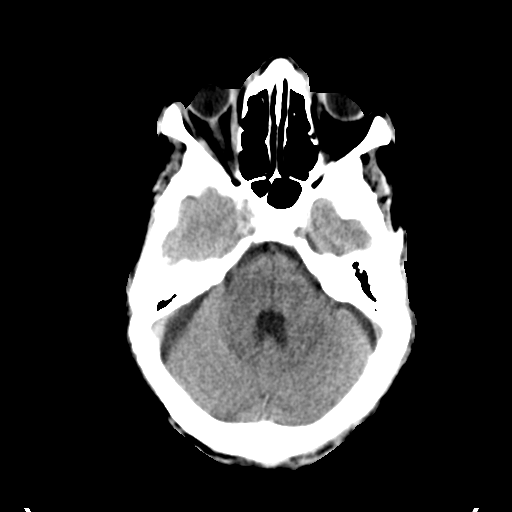
[im 14/32  brain]
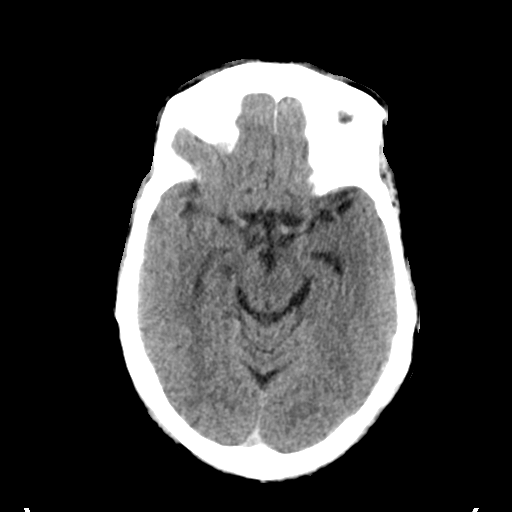
[im 18/32  brain]
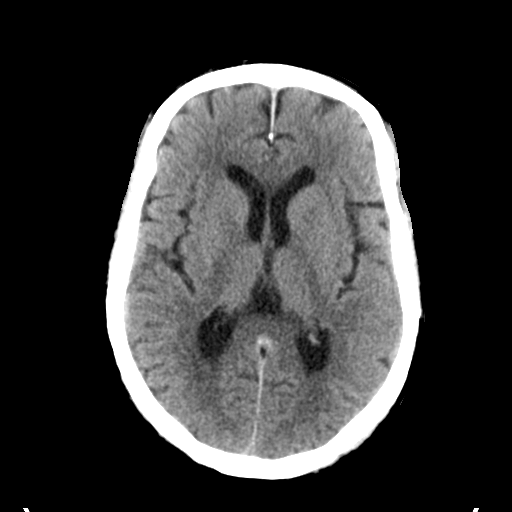
[im 18/32  bone]
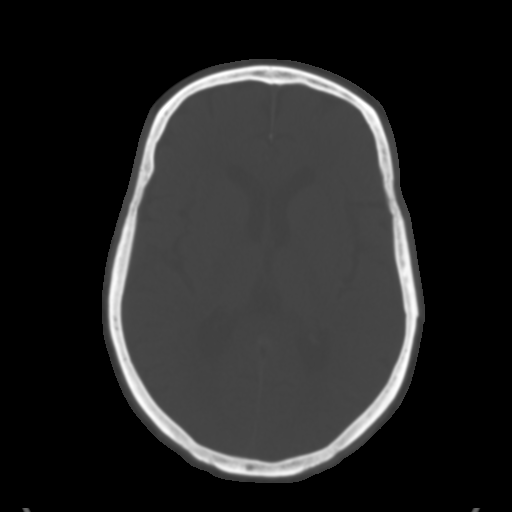
[im 22/32  brain]
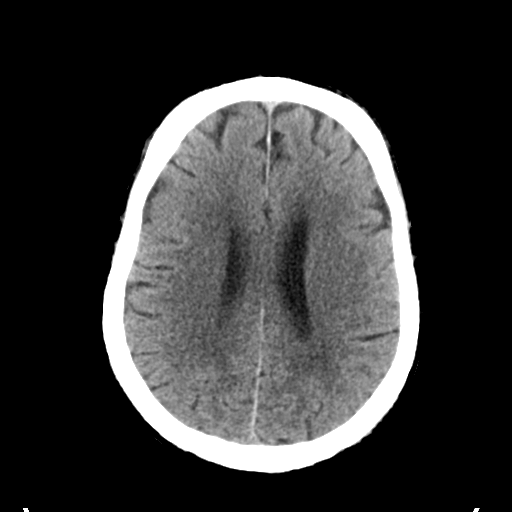
[im 25/32  brain]
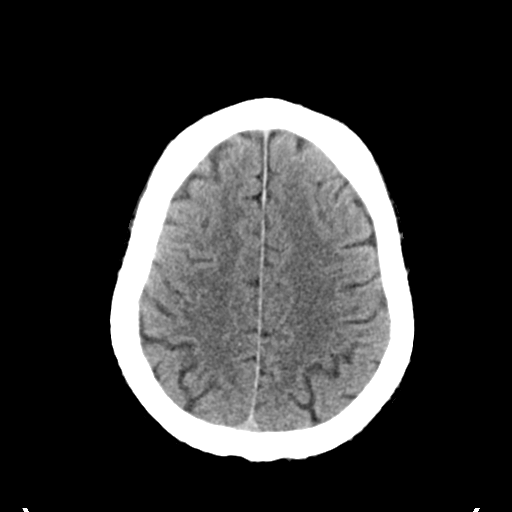
[im 29/32  brain]
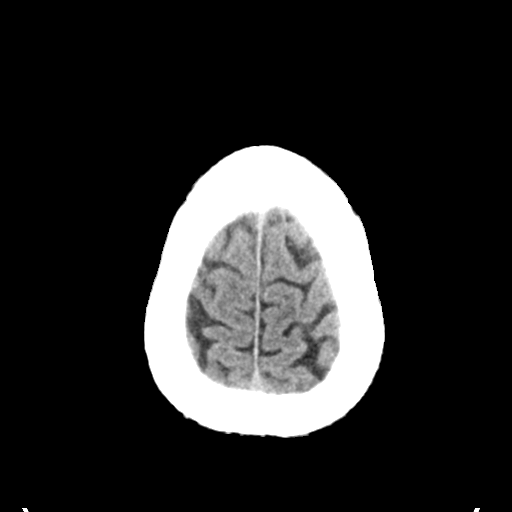

[Series 5: head 3.0 mpr cor · coronal · 0.31mm/px · 3 of 85 slices shown]
[im 29/85  brain]
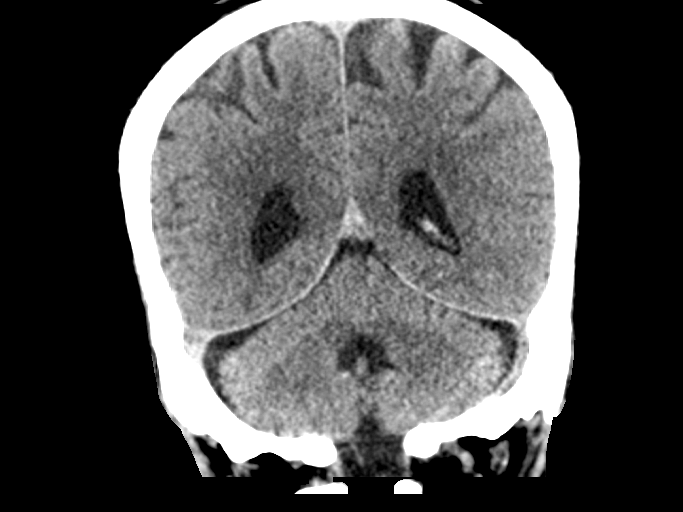
[im 38/85  brain]
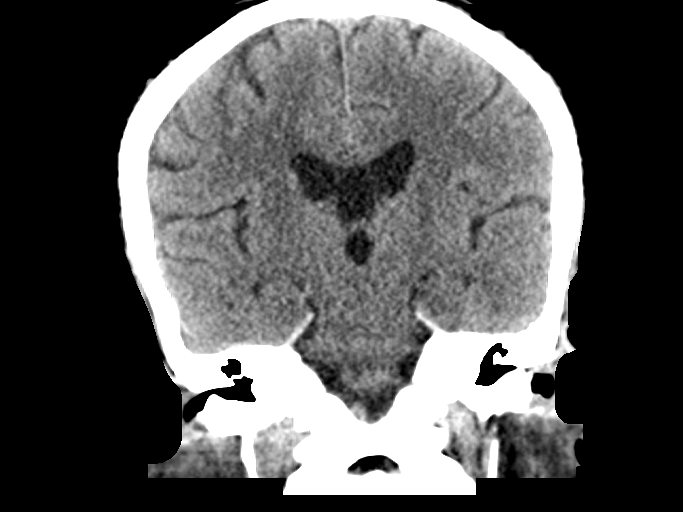
[im 47/85  brain]
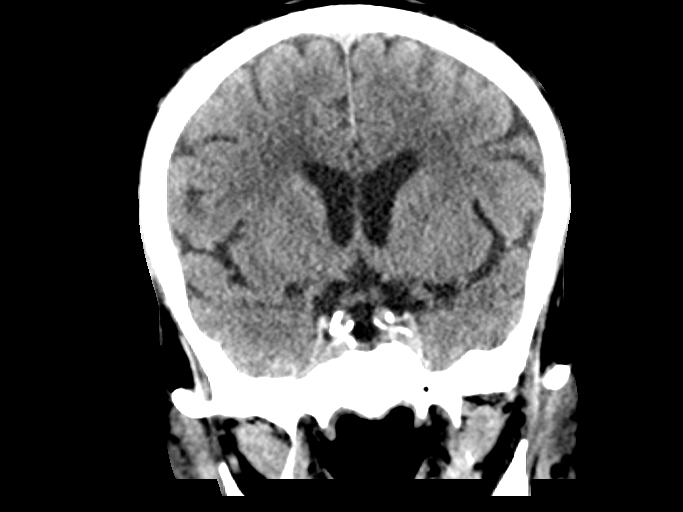

[Series 6: head 3.0 mpr sag · sagittal · 0.31mm/px · 3 of 67 slices shown]
[im 23/67  brain]
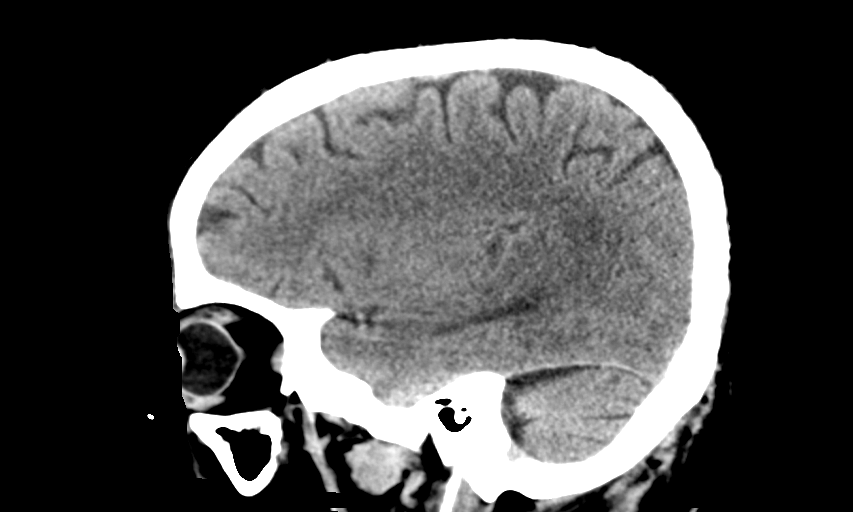
[im 34/67  brain]
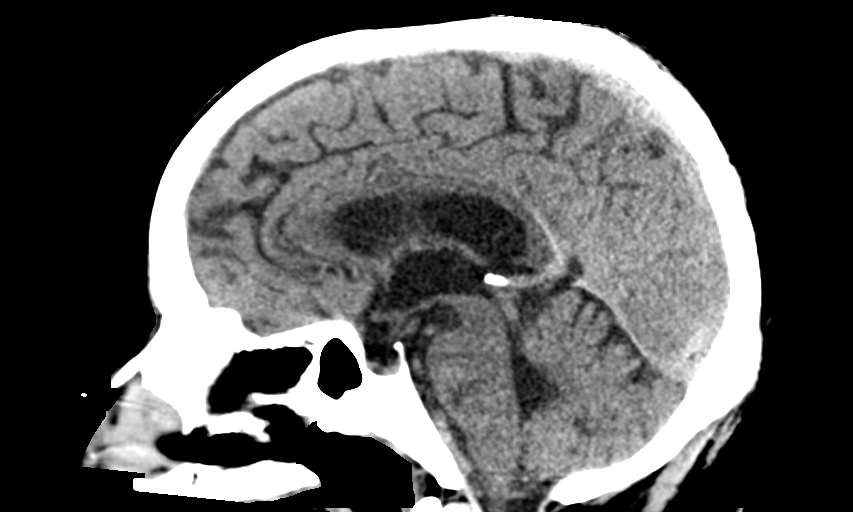
[im 45/67  brain]
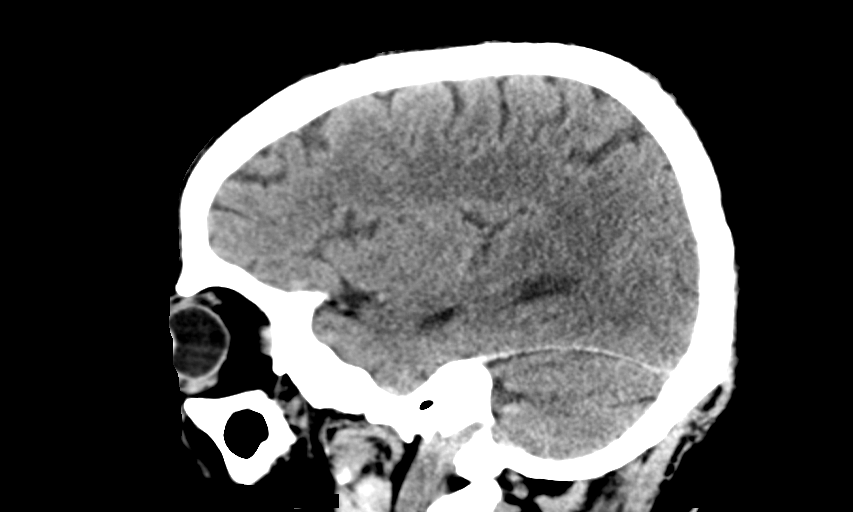

[14 of 47 positions shown; findings below may reference images not displayed]

FINDINGS: Brain: No acute infarct or hemorrhage. Lateral ventricles and
midline structures are unremarkable. No acute extra-axial fluid
collections. No mass effect.

Vascular: No hyperdense vessel or unexpected calcification.

Skull: Normal. Negative for fracture or focal lesion.

Sinuses/Orbits: No acute finding.

Other: None.
IMPRESSION: 1. No acute intracranial process.

## 2020-04-19 MED ORDER — SODIUM CHLORIDE 0.9% FLUSH
3.0000 mL | Freq: Once | INTRAVENOUS | Status: DC
Start: 2020-04-19 — End: 2020-04-20

## 2020-04-19 NOTE — ED Triage Notes (Addendum)
Patient arrived with EMS from a group home, staff reported left arm numbness this evening approx. 6 pm   , denies injury , no arm drift , speech clear , no facial asymmetry, denies pain .

## 2020-04-19 NOTE — ED Notes (Signed)
Pt's group home manager Earlene Plater #[phone] would like to be notified when pt gets a room. He is advocate for pt and reports pt's wife is asking if it is nothing life threatening that they would like to schedule outpatient follow up.

## 2020-04-19 NOTE — ED Notes (Signed)
Fred [name] guardian/group home manager  [phone]

## 2020-04-20 ENCOUNTER — Emergency Department (HOSPITAL_COMMUNITY): Payer: Medicare Other

## 2020-04-20 DIAGNOSIS — G540 Brachial plexus disorders: Secondary | ICD-10-CM

## 2020-04-20 IMAGING — MR MR HEAD W/O CM
6 of 12 series · 30 of 48 positions shown · non-contrast
Comparison: Head CT [DATE]

CLINICAL DATA: Left arm numbness.

EXAM:
MRI HEAD WITHOUT CONTRAST
MRI CERVICAL SPINE WITHOUT CONTRAST
TECHNIQUE: Multiplanar, multiecho pulse sequences of the brain and surrounding
structures, and cervical spine, to include the craniocervical
junction and cervicothoracic junction, were obtained without
intravenous contrast.

[Series 2: DWI · axial · 3.0mm · 0.94mm/px · z∈[-70,+72]mm · 9 of 100 slices shown (1 of 2)]
[im 1/100]
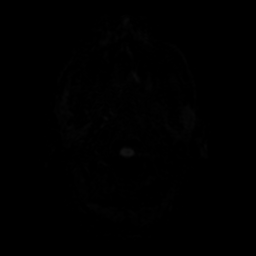
[im 13/100]
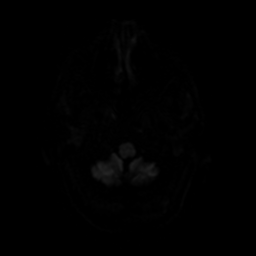
[im 25/100]
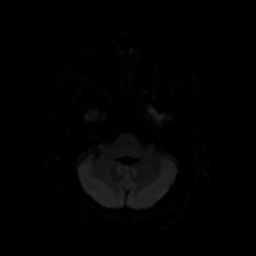
[im 38/100]
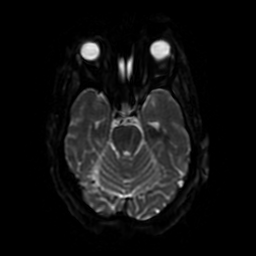
[im 50/100]
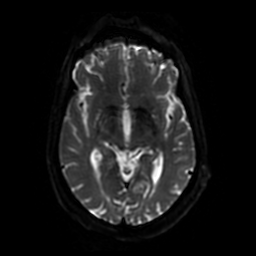
[im 62/100]
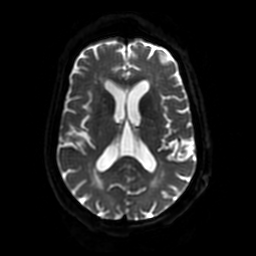
[im 75/100]
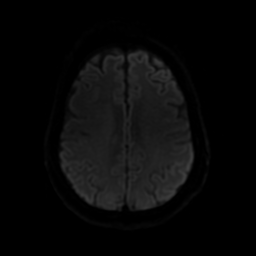
[im 87/100]
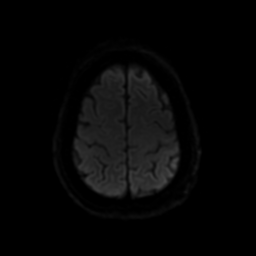
[im 100/100]
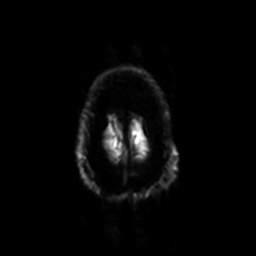

[Series 3: DWI · coronal · 4.0mm · 0.94mm/px · 7 of 74 slices shown (2 of 2)]
[im 1/74]
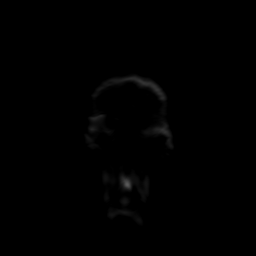
[im 13/74]
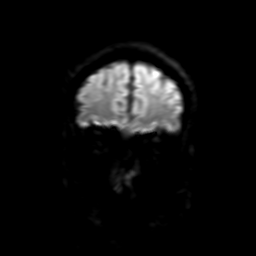
[im 25/74]
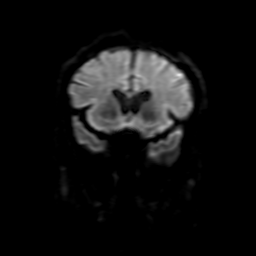
[im 37/74]
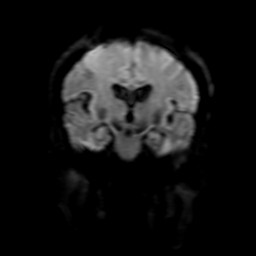
[im 49/74]
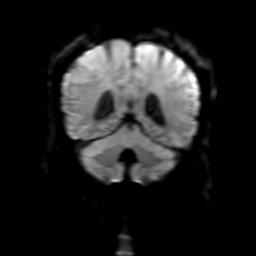
[im 61/74]
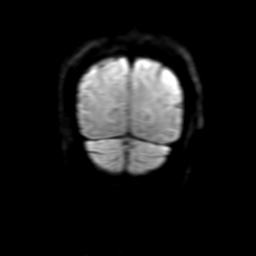
[im 74/74]
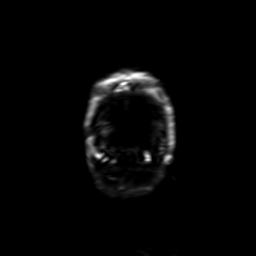

[Series 4: FLAIR · sagittal · 5.0mm · 0.23mm/px · 2 of 23 slices shown (1 of 2)]
[im 1/23]
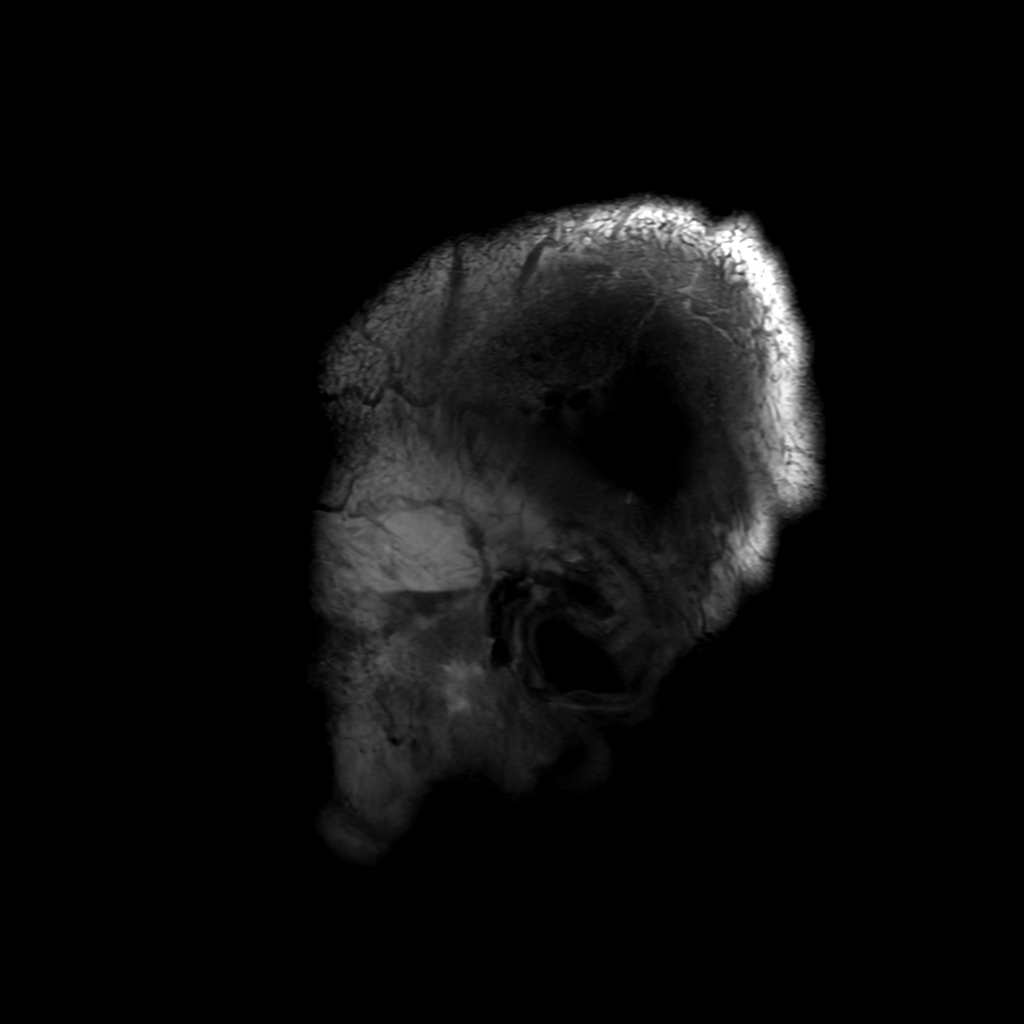
[im 23/23]
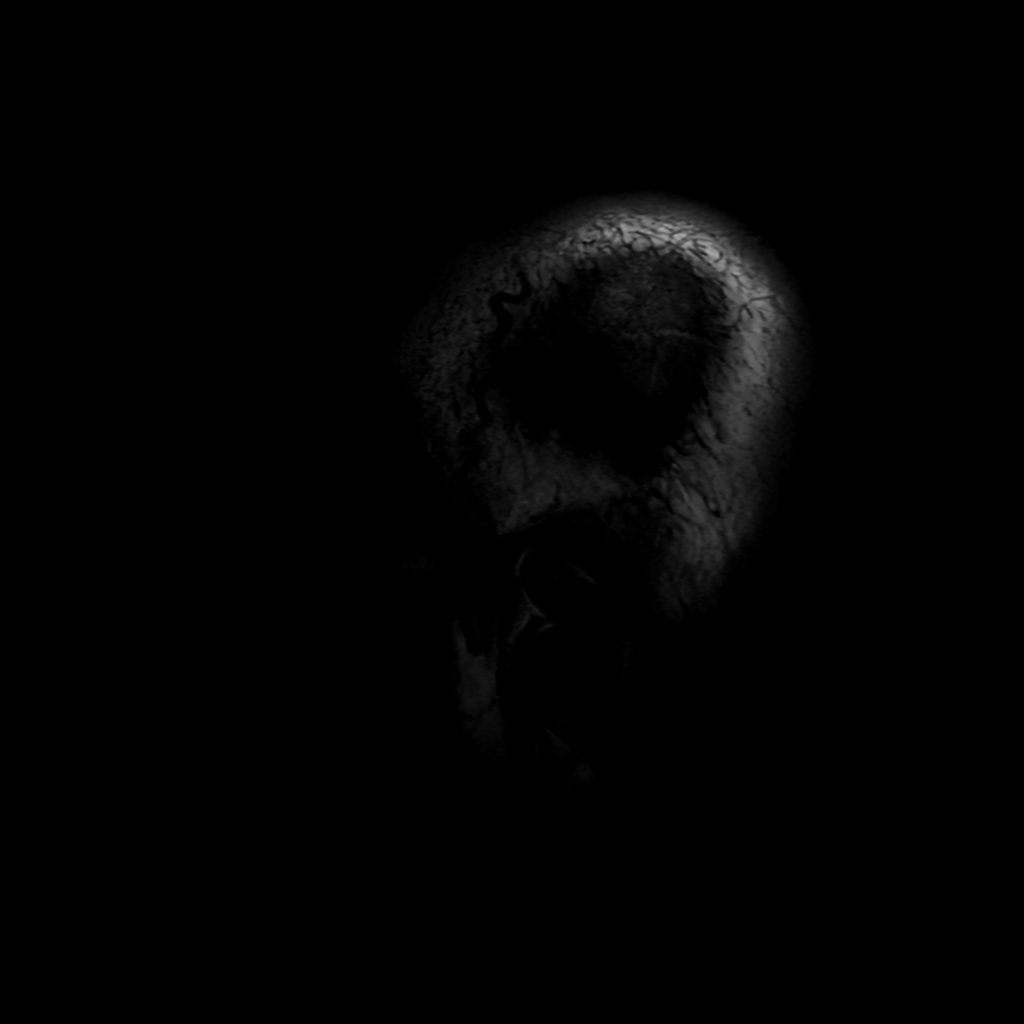

[Series 8: FLAIR · axial · 5.0mm · 0.47mm/px · z∈[-74,+77]mm · 3 of 27 slices shown (2 of 2)]
[im 1/27]
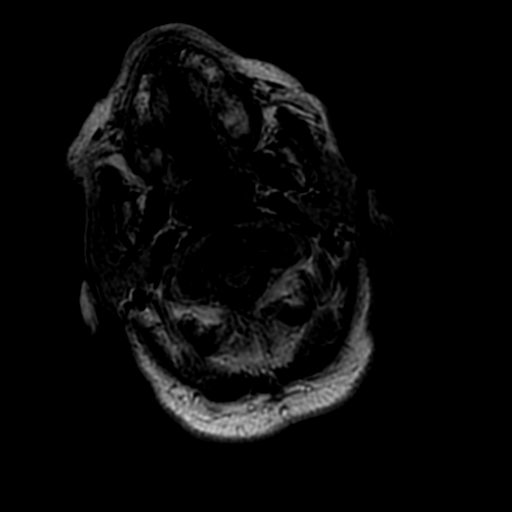
[im 14/27]
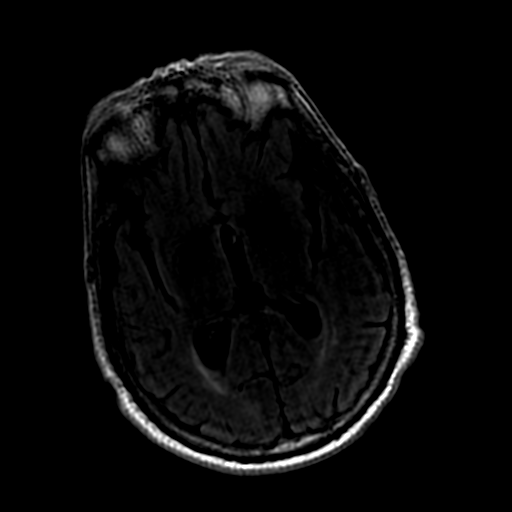
[im 27/27]
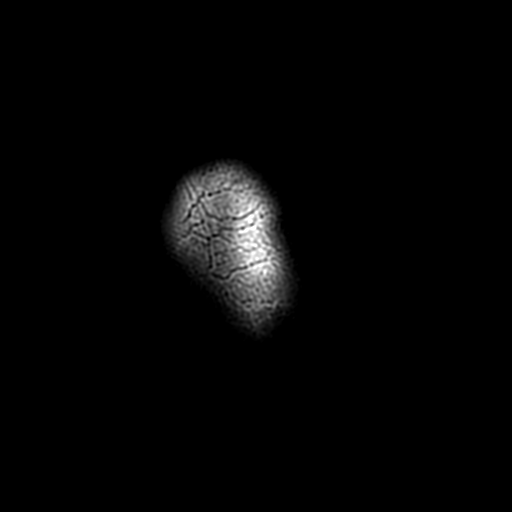

[Series 250: ADC · axial · 3.0mm · 0.94mm/px · z∈[-70,+72]mm · 5 of 50 slices shown (1 of 2)]
[im 1/50]
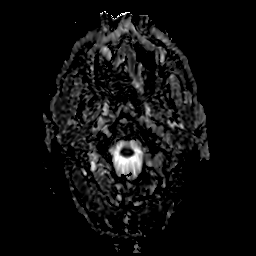
[im 13/50]
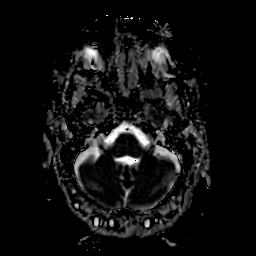
[im 25/50]
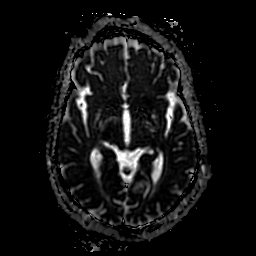
[im 37/50]
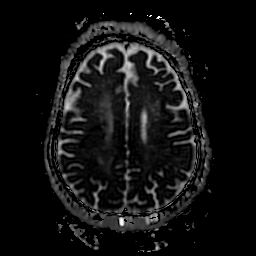
[im 50/50]
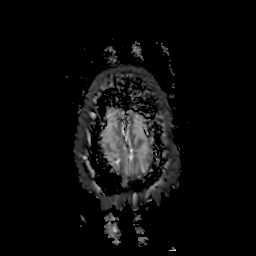

[Series 350: ADC · coronal · 4.0mm · 0.94mm/px · 4 of 37 slices shown (2 of 2)]
[im 1/37]
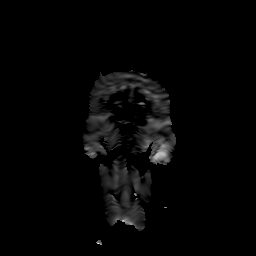
[im 13/37]
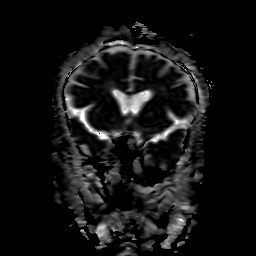
[im 25/37]
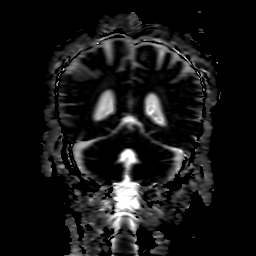
[im 37/37]
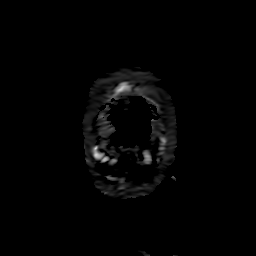

[30 of 48 positions shown; findings below may reference images not displayed]

FINDINGS: MRI HEAD FINDINGS

Some sequences suffer from up to moderate motion artifact.

Brain: There is no evidence of an acute infarct, intracranial
hemorrhage, mass, midline shift, or extra-axial fluid collection. T2
hyperintensities in the cerebral white matter bilaterally are
nonspecific but compatible with mild chronic small vessel ischemic
disease. Mild cerebral atrophy is within normal limits for age. A
cavum vergae is incidentally noted.

Vascular: Major intracranial vascular flow voids are grossly
preserved.

Skull and upper cervical spine: Unremarkable bone marrow signal.

Sinuses/Orbits: Unremarkable orbits. Paranasal sinuses and mastoid
air cells are clear.

Other: None.

MRI CERVICAL SPINE FINDINGS

The study is motion degraded throughout including severe motion on
the axial sequences.

Alignment: Trace retrolisthesis of C3 on C4.

Vertebrae: No fracture, suspicious osseous lesion, or significant
marrow edema. Prominent chronic degenerative endplate changes at
C3-4 including bulky anterior vertebral spurring with prominent
anterior spurring also noted at C4-5 and C5-6.

Cord: No cord signal abnormality identified within limitations of
motion artifact.

Posterior Fossa, vertebral arteries, paraspinal tissues:
Unremarkable.

Disc levels:

C2-3: Negative.

C3-4: Moderate disc space narrowing. Retrolisthesis with disc
uncovering/slight disc bulging and asymmetric right uncovertebral
spurring result in mild spinal stenosis and moderate right neural
foraminal stenosis.

C4-5 through C7-T1: No significant finding.
IMPRESSION: 1. No acute intracranial abnormality.
2. Mild chronic small vessel ischemic disease.
3. Motion degraded cervical spine MRI.
4. C3-4 disc degeneration with mild spinal stenosis and moderate
right neural foraminal stenosis.

## 2020-04-20 IMAGING — MR MR CERVICAL SPINE W/O CM
4 of 5 series · 18 of 48 positions shown · non-contrast
Comparison: Head CT [DATE]

CLINICAL DATA: Left arm numbness.

EXAM:
MRI HEAD WITHOUT CONTRAST
MRI CERVICAL SPINE WITHOUT CONTRAST
TECHNIQUE: Multiplanar, multiecho pulse sequences of the brain and surrounding
structures, and cervical spine, to include the craniocervical
junction and cervicothoracic junction, were obtained without
intravenous contrast.

[Series 3: T2 · sagittal · 3.0mm · 0.43mm/px · 4 of 16 slices shown (1 of 2)]
[im 1/16]
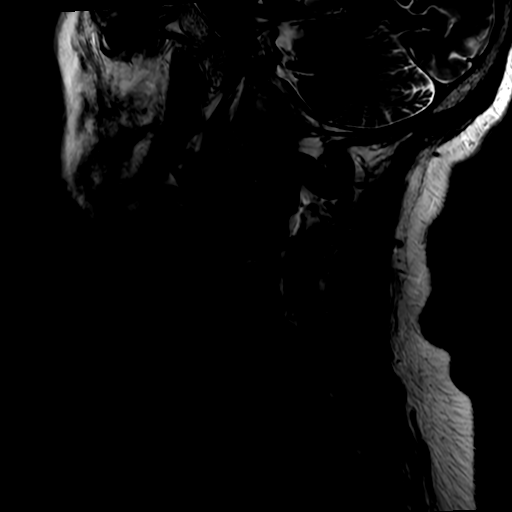
[im 6/16]
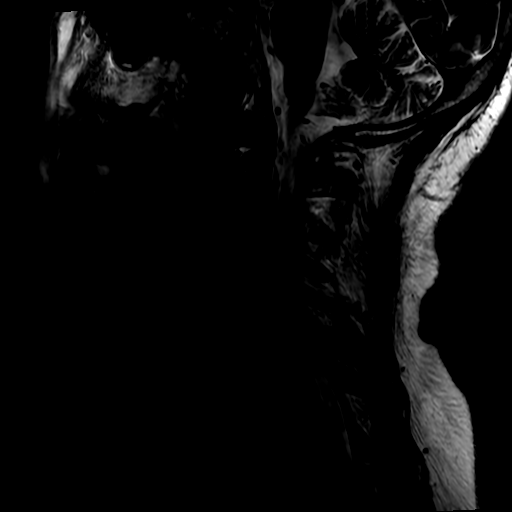
[im 11/16]
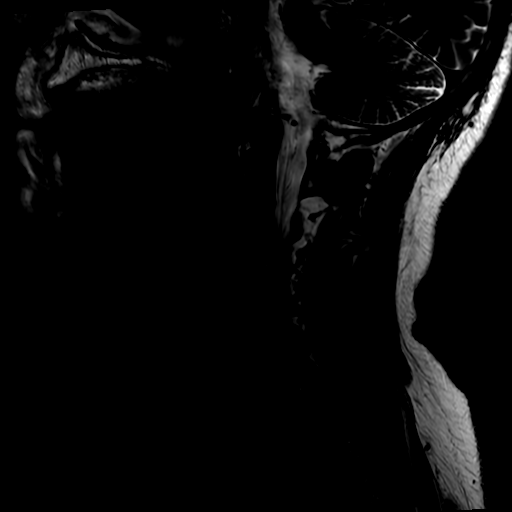
[im 16/16]
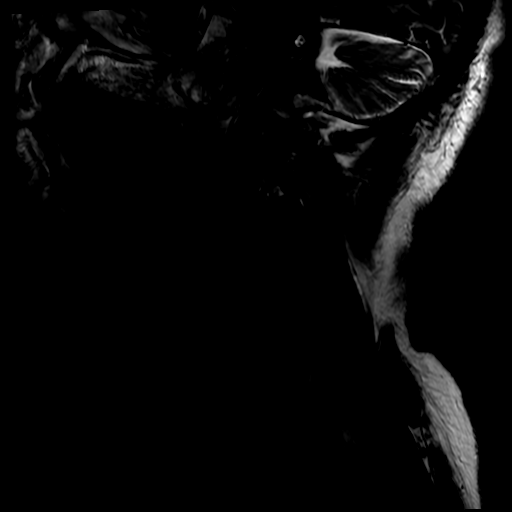

[Series 4: FLAIR · sagittal · 3.0mm · 0.43mm/px · 3 of 16 slices shown]
[im 1/16]
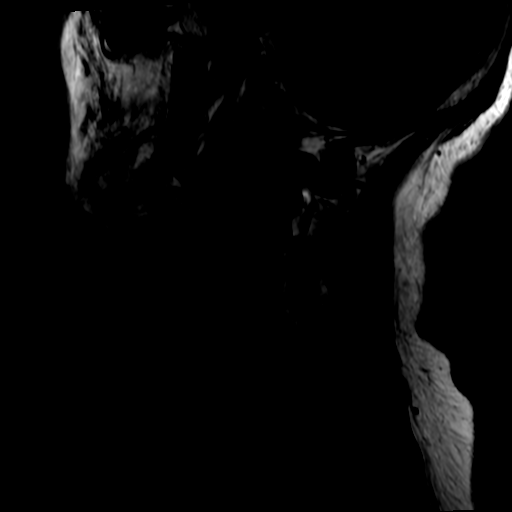
[im 8/16]
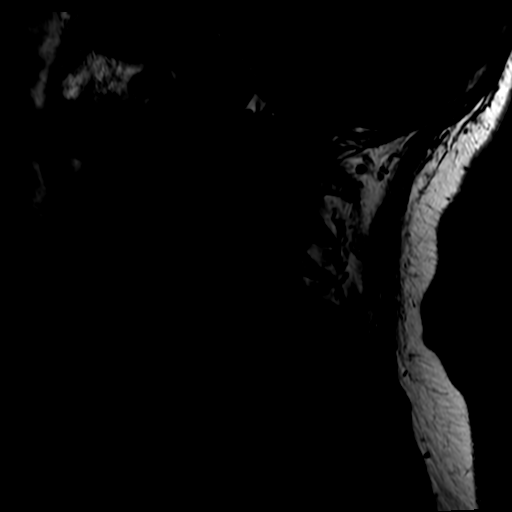
[im 16/16]
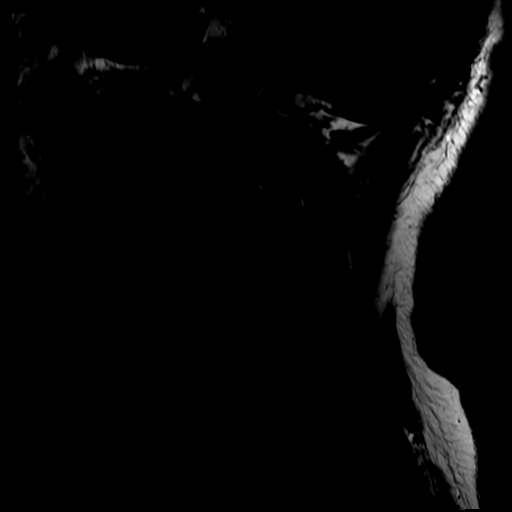

[Series 5: STIR · sagittal · 3.0mm · 0.43mm/px · 3 of 16 slices shown]
[im 1/16]
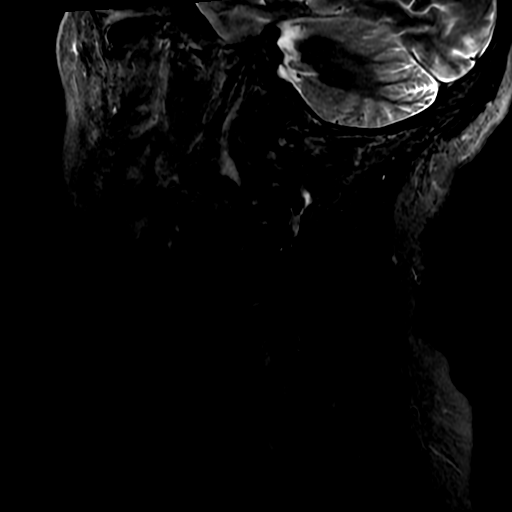
[im 8/16]
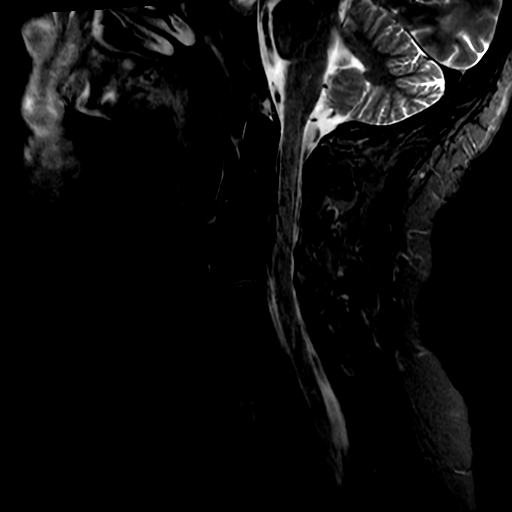
[im 16/16]
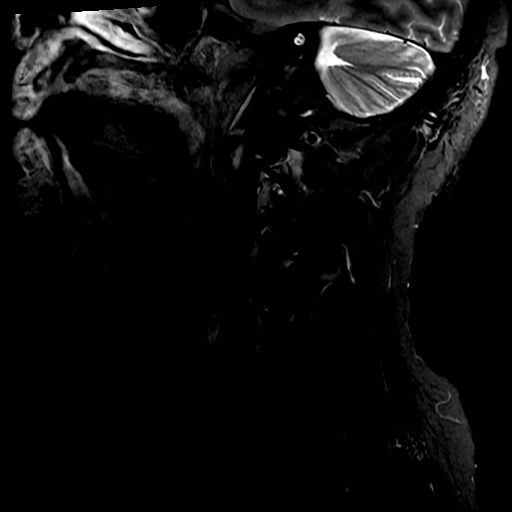

[Series 6: T2 · axial · 3.0mm · 0.35mm/px · z∈[-201,-105]mm · 8 of 27 slices shown (2 of 2)]
[im 1/27]
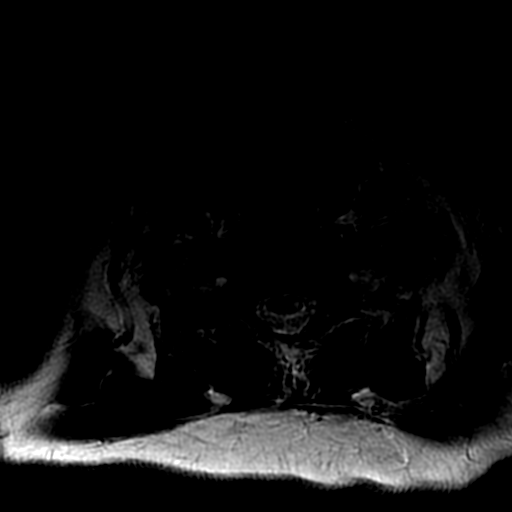
[im 4/27]
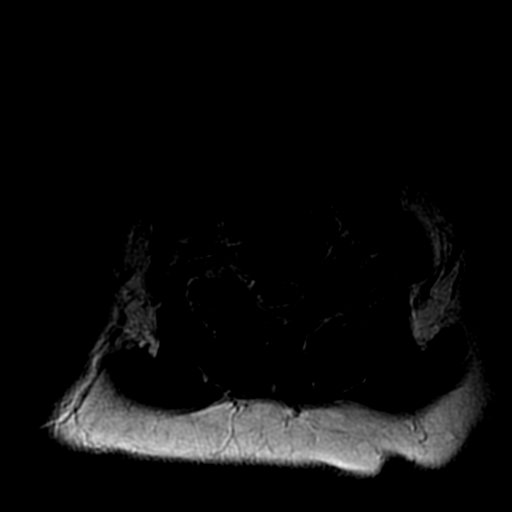
[im 8/27]
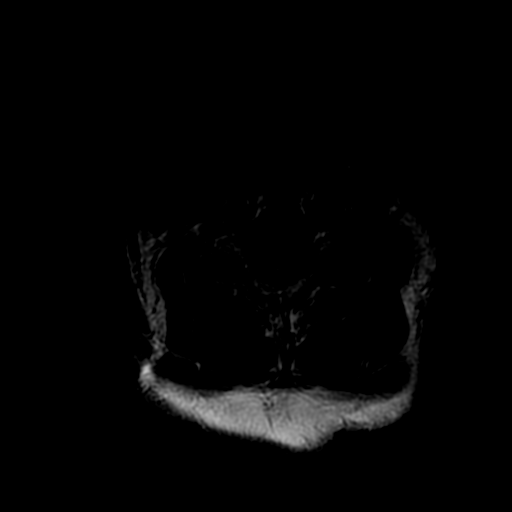
[im 12/27]
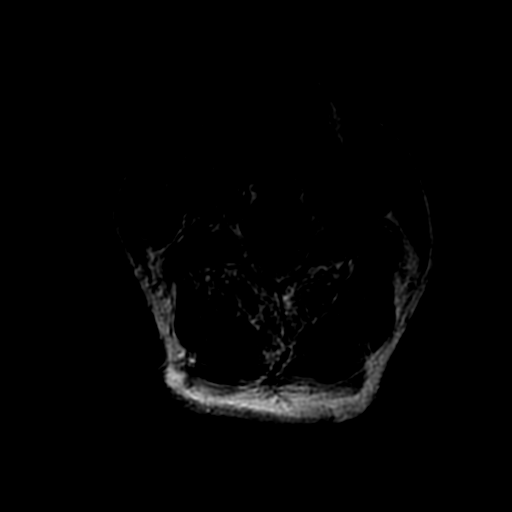
[im 15/27]
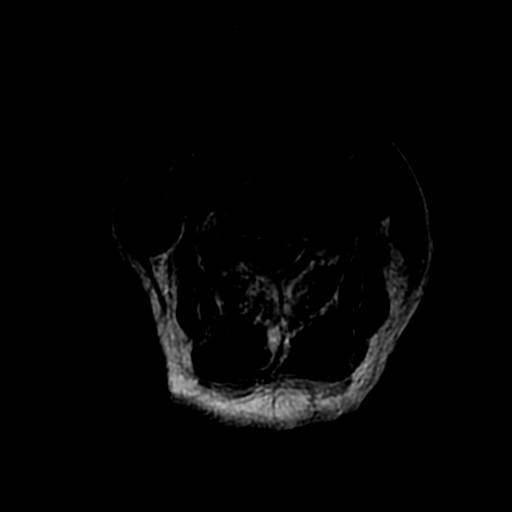
[im 19/27]
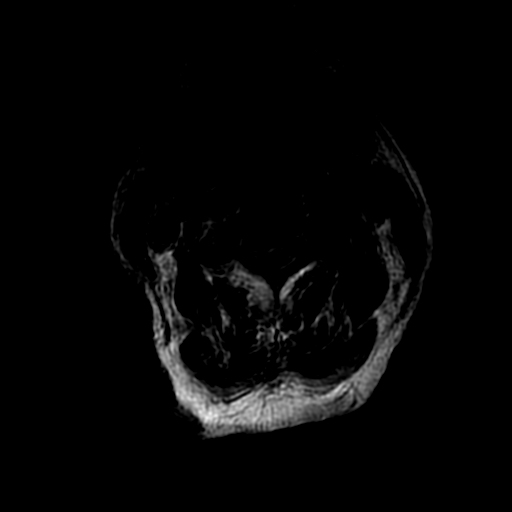
[im 23/27]
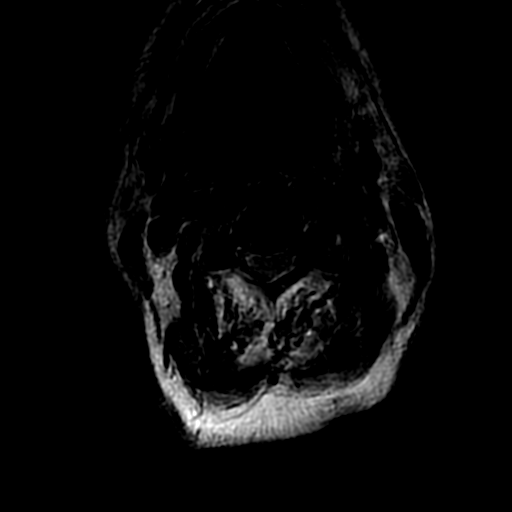
[im 27/27]
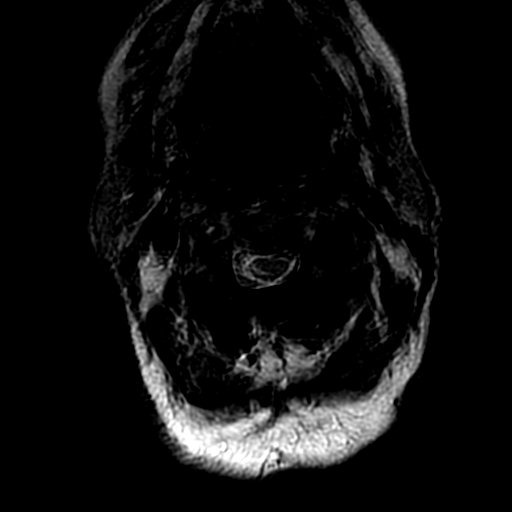

[18 of 48 positions shown; findings below may reference images not displayed]

FINDINGS: MRI HEAD FINDINGS

Some sequences suffer from up to moderate motion artifact.

Brain: There is no evidence of an acute infarct, intracranial
hemorrhage, mass, midline shift, or extra-axial fluid collection. T2
hyperintensities in the cerebral white matter bilaterally are
nonspecific but compatible with mild chronic small vessel ischemic
disease. Mild cerebral atrophy is within normal limits for age. A
cavum vergae is incidentally noted.

Vascular: Major intracranial vascular flow voids are grossly
preserved.

Skull and upper cervical spine: Unremarkable bone marrow signal.

Sinuses/Orbits: Unremarkable orbits. Paranasal sinuses and mastoid
air cells are clear.

Other: None.

MRI CERVICAL SPINE FINDINGS

The study is motion degraded throughout including severe motion on
the axial sequences.

Alignment: Trace retrolisthesis of C3 on C4.

Vertebrae: No fracture, suspicious osseous lesion, or significant
marrow edema. Prominent chronic degenerative endplate changes at
C3-4 including bulky anterior vertebral spurring with prominent
anterior spurring also noted at C4-5 and C5-6.

Cord: No cord signal abnormality identified within limitations of
motion artifact.

Posterior Fossa, vertebral arteries, paraspinal tissues:
Unremarkable.

Disc levels:

C2-3: Negative.

C3-4: Moderate disc space narrowing. Retrolisthesis with disc
uncovering/slight disc bulging and asymmetric right uncovertebral
spurring result in mild spinal stenosis and moderate right neural
foraminal stenosis.

C4-5 through C7-T1: No significant finding.
IMPRESSION: 1. No acute intracranial abnormality.
2. Mild chronic small vessel ischemic disease.
3. Motion degraded cervical spine MRI.
4. C3-4 disc degeneration with mild spinal stenosis and moderate
right neural foraminal stenosis.

## 2020-04-20 NOTE — ED Provider Notes (Signed)
Care assumed from [facility] at shift change, please see his note for full details, but in brief Fred [name] is a 69 y.o. male who presents to the emergency department with weakness and numbness of the left hand and wrist.  Patient has a history of MR and is unable to provide much history, caretaker states that he was last seen normally on Friday, went to go visit a family member over the weekend and when he came back they noticed last night that he was not able to hold his medicines and his hand, and typically he likes to constantly shuffle cards but was not doing this.  They noted that the left hand seemed limp and he could not hold it up.  Patient has not complained of any pain they do not know of any injury or trauma although the patient was away for the weekend.  They have not noted any other abnormal symptoms, no changes in speech, patient has not been complaining of pain or headache.  He has had normal gait and balance and no weakness of the legs or right upper extremity noted.   Work-up thus far has been reassuring with no significant laboratory derangements aside from mild hypokalemia, encouraged to increase potassium intake in diet.  Head CT unremarkable.  MRI of the brain and cervical spine pending.  Physical Exam  BP 137/79   Pulse 65   Temp 98.1 F (36.7 C) (Oral)   Resp 15   SpO2 98%   Physical Exam Vitals and nursing note reviewed.  Constitutional:      General: He is not in acute distress.    Appearance: He is well-developed. He is not diaphoretic.     Comments: MR, alert and at baseline mental status  HENT:     Head: Normocephalic and atraumatic.  Eyes:     General:        Right eye: No discharge.        Left eye: No discharge.  Pulmonary:     Effort: Pulmonary effort is normal. No respiratory distress.  Neurological:     Mental Status: He is alert.     Coordination: Coordination normal.     Comments: Patient is able to move the shoulder without difficulty on  the left but has wrist drop and is unable to grip or move the fingers.  Patient has difficulty following some commands unable to determine if patient can supinate and pronate the arm.  He is able to lift both arms overhead, does not have any weakness of the right upper extremity.  Psychiatric:        Behavior: Behavior normal.     ED Course/Procedures   Labs Reviewed  CBC - Abnormal; Notable for the following components:      Result Value   RBC 4.17 (*)    Hemoglobin 12.1 (*)    HCT 38.3 (*)    All other components within normal limits  COMPREHENSIVE METABOLIC PANEL - Abnormal; Notable for the following components:   Potassium 3.2 (*)    Glucose, Bld 116 (*)    AST 14 (*)    Total Bilirubin 0.2 (*)    All other components within normal limits  I-STAT CHEM 8, ED - Abnormal; Notable for the following components:   Potassium 3.2 (*)    Glucose, Bld 109 (*)    Hemoglobin 12.6 (*)    HCT 37.0 (*)    All other components within normal limits  CBG MONITORING, ED - Abnormal;  Notable for the following components:   Glucose-Capillary 102 (*)    All other components within normal limits  PROTIME-INR  APTT  DIFFERENTIAL   CT HEAD WO CONTRAST  Result Date: 04/19/2020 CLINICAL DATA:  Left arm numbness since 6 p.m. EXAM: CT HEAD WITHOUT CONTRAST TECHNIQUE: Contiguous axial images were obtained from the base of the skull through the vertex without intravenous contrast. COMPARISON:  07/22/2013 FINDINGS: Brain: No acute infarct or hemorrhage. Lateral ventricles and midline structures are unremarkable. No acute extra-axial fluid collections. No mass effect. Vascular: No hyperdense vessel or unexpected calcification. Skull: Normal. Negative for fracture or focal lesion. Sinuses/Orbits: No acute finding. Other: None. IMPRESSION: 1. No acute intracranial process. Electronically Signed   By: Sharlet Salina M.D.   On: 04/19/2020 21:30   MR BRAIN WO CONTRAST  Result Date: 04/20/2020 CLINICAL DATA:   Left arm numbness. EXAM: MRI HEAD WITHOUT CONTRAST MRI CERVICAL SPINE WITHOUT CONTRAST TECHNIQUE: Multiplanar, multiecho pulse sequences of the brain and surrounding structures, and cervical spine, to include the craniocervical junction and cervicothoracic junction, were obtained without intravenous contrast. COMPARISON:  Head CT 04/19/2020 FINDINGS: MRI HEAD FINDINGS Some sequences suffer from up to moderate motion artifact. Brain: There is no evidence of an acute infarct, intracranial hemorrhage, mass, midline shift, or extra-axial fluid collection. T2 hyperintensities in the cerebral white matter bilaterally are nonspecific but compatible with mild chronic small vessel ischemic disease. Mild cerebral atrophy is within normal limits for age. A cavum vergae is incidentally noted. Vascular: Major intracranial vascular flow voids are grossly preserved. Skull and upper cervical spine: Unremarkable bone marrow signal. Sinuses/Orbits: Unremarkable orbits. Paranasal sinuses and mastoid air cells are clear. Other: None. MRI CERVICAL SPINE FINDINGS The study is motion degraded throughout including severe motion on the axial sequences. Alignment: Trace retrolisthesis of C3 on C4. Vertebrae: No fracture, suspicious osseous lesion, or significant marrow edema. Prominent chronic degenerative endplate changes at C3-4 including bulky anterior vertebral spurring with prominent anterior spurring also noted at C4-5 and C5-6. Cord: No cord signal abnormality identified within limitations of motion artifact. Posterior Fossa, vertebral arteries, paraspinal tissues: Unremarkable. Disc levels: C2-3: Negative. C3-4: Moderate disc space narrowing. Retrolisthesis with disc uncovering/slight disc bulging and asymmetric right uncovertebral spurring result in mild spinal stenosis and moderate right neural foraminal stenosis. C4-5 through C7-T1: No significant finding. IMPRESSION: 1. No acute intracranial abnormality. 2. Mild chronic small  vessel ischemic disease. 3. Motion degraded cervical spine MRI. 4. 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 Electronically Signed   By: Sebastian Ache M.D.   On: 04/20/2020 14:09   MR Cervical Spine Wo Contrast  Result Date: 04/20/2020 CLINICAL DATA:  Left arm numbness. EXAM: MRI HEAD WITHOUT CONTRAST MRI CERVICAL SPINE WITHOUT CONTRAST TECHNIQUE: Multiplanar, multiecho pulse sequences of the brain and surrounding structures, and cervical spine, to include the craniocervical junction and cervicothoracic junction, were obtained without intravenous contrast. COMPARISON:  Head CT 04/19/2020 FINDINGS: MRI HEAD FINDINGS Some sequences suffer from up to moderate motion artifact. Brain: There is no evidence of an acute infarct, intracranial hemorrhage, mass, midline shift, or extra-axial fluid collection. T2 hyperintensities in the cerebral white matter bilaterally are nonspecific but compatible with mild chronic small vessel ischemic disease. Mild cerebral atrophy is within normal limits for age. A cavum vergae is incidentally noted. Vascular: Major intracranial vascular flow voids are grossly preserved. Skull and upper cervical spine: Unremarkable bone marrow signal. Sinuses/Orbits: Unremarkable orbits. Paranasal sinuses and mastoid air cells are clear.  Other: None. MRI CERVICAL SPINE FINDINGS The study is motion degraded throughout including severe motion on the axial sequences. Alignment: Trace retrolisthesis of C3 on C4. Vertebrae: No fracture, suspicious osseous lesion, or significant marrow edema. Prominent chronic degenerative endplate changes at C3-4 including bulky anterior vertebral spurring with prominent anterior spurring also noted at C4-5 and C5-6. Cord: No cord signal abnormality identified within limitations of motion artifact. Posterior Fossa, vertebral arteries, paraspinal tissues: Unremarkable. Disc levels: C2-3: Negative. C3-4: Moderate disc space  narrowing. Retrolisthesis with disc uncovering/slight disc bulging and asymmetric right uncovertebral spurring result in mild spinal stenosis and moderate right neural foraminal stenosis. C4-5 through C7-T1: No significant finding. IMPRESSION: 1. No acute intracranial abnormality. 2. Mild chronic small vessel ischemic disease. 3. Motion degraded cervical spine MRI. 4. 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 Electronically Signed   By: Sebastian Ache M.D.   On: 04/20/2020 14:09     Procedures  MDM   MRIs returned without evidence of stroke, C-spine with C3-4 spinal stenosis which is mild there is some moderate right foraminal stenosis noted but patient does not have any right-sided symptoms feel this is less likely be the cause of patient's symptoms.  Will consult with neurology.  Case discussed with Dr. Otelia Limes who will see and evaluate the patient.  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  Neurology team recommends providing patient with a cock up wrist splint for support and that if this is not improving with time patient will need outpatient brachial plexus MRI and may need nerve conduction testing.  Recommend neurology referral to Dr. Marjory Lies, no further emergent work-up needed at this time.  I discussed this plan with the patient and his caretaker who expressed agreement, patient placed in Velcro cock-up wrist brace.  Discussed with caretaker that this can be removed intermittently but should be worn most of the time for support and that if symptoms or not improving they will need neurology follow-up for further evaluation.  They expressed understanding and agreement.  Final diagnoses:  Brachial plexopathy           Dartha Lodge, PA-C 04/20/20 1751    Little,  Ambrose Finland, MD 04/21/20 [phone]

## 2020-04-20 NOTE — Progress Notes (Signed)
Orthopedic Tech Progress Note Patient Details:  Keyvin Rison 1951/06/26 [id number]  Ortho Devices Type of Ortho Device: Velcro wrist splint Ortho Device/Splint Location: Left Upper Extremity Ortho Device/Splint Interventions: Ordered, Application, Adjustment   Post Interventions Patient Tolerated: Well Instructions Provided: Care of device, Adjustment of device, Poper ambulation with device   Javana Schey Clarene Reamer 04/20/2020, 6:02 PM

## 2020-04-20 NOTE — ED Provider Notes (Signed)
[name] EMERGENCY DEPARTMENT Provider Note   CSN: [id number] Arrival date & time: 04/19/20  1943     History Chief Complaint  Patient presents with  . Arm Numbness    Fred [name] is a 69 y.o. male history of MR, hypertension, diabetes, BPH.  Patient arrives with group home caretaker for concern of left arm weakness.  Caretaker reports that patient went to go visit family member over the weekend, when he last saw the patient he was using his left arm as normal, on Friday.  When they picked the patient up today they noticed that his left hand was limp and he could not hold his medications like he normally does and was not using it to shuffle cards which is a normal tic for the patient.  Report patient otherwise acting his normal self, normal speech balance and gait using the other 3 extremities normally.  No history of injury or recent illness.  Level 5 caveat intellectual disability.  HPI     Past Medical History:  Diagnosis Date  . Diabetes mellitus   . Hypertension   . Mental retardation     Patient Active Problem List   Diagnosis Date Noted  . Pain due to onychomycosis of toenails of both feet 02/08/2019  . Aortic ectasia, abdominal ([place]) 04/23/2018  . Anemia 08/06/2017  . Elevated prostate specific antigen (PSA) 06/29/2014  . Incomplete emptying of bladder 06/29/2014  . Urinary incontinence 06/29/2014  . BPH with urinary obstruction 06/27/2014  . Hypertension associated with diabetes ([place]) 09/19/2012  . Intellectual disability 09/19/2012  . Type 2 diabetes mellitus without complication, without long-term current use of insulin ([place]) 09/19/2012    History reviewed. No pertinent surgical history.     No family history on file.  Social History   Tobacco Use  . Smoking status: Current Every Day Smoker  . Smokeless tobacco: Never Used  Substance Use Topics  . Alcohol use: No  . Drug use: No    Home Medications Prior to Admission  medications   Medication Sig Start Date End Date Taking? Authorizing Provider  acetaminophen (TYLENOL) 325 MG tablet Take by mouth. 03/17/18   [provider]  alfuzosin (UROXATRAL) 10 MG 24 hr tablet Take 10 mg by mouth daily. 03/30/20   [provider]  amLODipine (NORVASC) 10 MG tablet  05/22/19   [provider]  amlodipine-atorvastatin (CADUET) 10-10 MG tablet Take by mouth.    [provider]  aspirin EC 81 MG tablet Take 81 mg by mouth daily.    [provider]  atorvastatin (LIPITOR) 20 MG tablet  02/28/18   [provider]  benzocaine (ORAJEL) 10 % mucosal gel Use as directed in the mouth or throat as needed for Pain.    [provider]  benztropine (COGENTIN) 0.5 MG tablet Take 0.5 mg by mouth every morning.     [provider]  carvedilol (COREG) 12.5 MG tablet Take 12.5 mg by mouth. 06/16/15 06/15/16  [provider]  cholecalciferol (VITAMIN D) 1000 UNITS tablet Take 1,000 Units by mouth daily.    [provider]  clotrimazole (LOTRIMIN) 1 % cream Apply topically 2 (two) times daily as needed. For foot fungus    [provider]  clotrimazole-betamethasone (LOTRISONE) cream Apply 1 application topically 2 (two) times daily. 11/25/13   Hyatt, Max T, DPM  econazole nitrate 1 % cream Apply 1 application topically 2 (two) times daily. Apply to foot    [provider]  ferrous sulfate 325 (65 FE) MG tablet Take by mouth. 03/27/18   [provider]  glucose monitoring kit (FREESTYLE) monitoring kit Please dispense whichever glucometer is covered by pt's insurance with lancets and strips also; check blood sugar as directed 03/27/17   [provider]  HYDROcodone-acetaminophen (NORCO/VICODIN) 5-325 MG tablet Take 1 tablet by mouth every 6 (six) hours as needed. 12/16/19   [provider]  Lancets Misc. (ACCU-CHEK SOFTCLIX LANCET DEV) KIT by Does not apply route. 11/24/17    [provider]  lisinopril-hydrochlorothiazide (PRINZIDE,ZESTORETIC) 20-25 MG per tablet Take 1 tablet by mouth daily.    [provider]  loratadine (CLARITIN) 10 MG tablet Take 10 mg by mouth every morning.    [provider]  metFORMIN (GLUCOPHAGE) 500 MG tablet take ONE TABLET BY MOUTH TWICE DAILY WITH MEALS 01/20/15   [provider]  NIFEdipine (ADALAT CC) 90 MG 24 hr tablet Take by mouth.    [provider]  nortriptyline (PAMELOR) 25 MG capsule Take 25 mg by mouth at bedtime.     [provider]  polyethylene glycol (MIRALAX / GLYCOLAX) 17 g packet Use 1 capful of MiraLAX daily as needed for constipation 01/07/20   [provider]  potassium chloride (K-DUR) 10 MEQ tablet Take 10 mEq by mouth daily.    [provider]  risperiDONE (RISPERDAL) 3 MG tablet  05/16/19   [provider]  sertraline (ZOLOFT) 50 MG tablet Take 50 mg by mouth daily.    [provider]  silodosin (RAPAFLO) 8 MG CAPS capsule Take 8 mg by mouth daily with breakfast.    [provider]  Urea (UMECTA) 40 % EMUL Apply 1 application topically 2 (two) times daily. 02/24/15   Hyatt, Max T, DPM    Allergies    Patient has no known allergies.  Review of Systems   Review of Systems  Unable to perform ROS: Other    Physical Exam Updated Vital Signs BP 135/81 (BP Location: Right Arm)   Pulse 69   Temp 98.1 F (36.7 C) (Oral)   Resp 14   SpO2 97%   Physical Exam Constitutional:      General: He is not in acute distress.    Appearance: Normal appearance. He is well-developed. He is not ill-appearing or diaphoretic.  HENT:     Head: Normocephalic and atraumatic.  Eyes:     General: Vision grossly intact. Gaze aligned appropriately.     Pupils: Pupils are equal, round, and reactive to light.  Neck:     Trachea: Trachea and phonation normal.  Pulmonary:     Effort: Pulmonary effort is normal. No respiratory  distress.  Abdominal:     General: There is no distension.     Palpations: Abdomen is soft.     Tenderness: There is no abdominal tenderness. There is no guarding or rebound.  Musculoskeletal:        General: Normal range of motion.     Cervical back: Normal range of motion.  Skin:    General: Skin is warm and dry.  Neurological:     Mental Status: He is alert.     GCS: GCS eye subscore is 4. GCS verbal subscore is 5. GCS motor subscore is 6.     Comments: Examination limited secondary to MR.  No obvious facial droop, normal responses and speech per caretaker at bedside.  Patient's left wrist is hanging limp left-sided patient with a very weak grip on the  left side compared to the right, weak movements of the left wrist compared to right, normal strength at the left elbow.  Examination again limited secondary to MR.  No obvious lower extremity weakness.  Psychiatric:        Behavior: Behavior normal.    ED Results / Procedures / Treatments   Labs (all labs ordered are listed, but only abnormal results are displayed) Labs Reviewed  CBC - Abnormal; Notable for the following components:      Result Value   RBC 4.17 (*)    Hemoglobin 12.1 (*)    HCT 38.3 (*)    All other components within normal limits  COMPREHENSIVE METABOLIC PANEL - Abnormal; Notable for the following components:   Potassium 3.2 (*)    Glucose, Bld 116 (*)    AST 14 (*)    Total Bilirubin 0.2 (*)    All other components within normal limits  I-STAT CHEM 8, ED - Abnormal; Notable for the following components:   Potassium 3.2 (*)    Glucose, Bld 109 (*)    Hemoglobin 12.6 (*)    HCT 37.0 (*)    All other components within normal limits  CBG MONITORING, ED - Abnormal; Notable for the following components:   Glucose-Capillary 102 (*)    All other components within normal limits  PROTIME-INR  APTT  DIFFERENTIAL    EKG EKG Interpretation  Date/Time:  Sunday April 19 2020 20:06:49 EDT Ventricular Rate:   64 PR Interval:  194 QRS Duration: 88 QT Interval:  404 QTC Calculation: 416 R Axis:   -34 Text Interpretation: Normal sinus rhythm Left axis deviation Possible Anterior infarct , age undetermined Abnormal ECG No significant change since last tracing Confirmed by Isla Pence [phone]) on 04/20/2020 10:42:35 AM   Radiology CT HEAD WO CONTRAST  Result Date: 04/19/2020 CLINICAL DATA:  Left arm numbness since 6 p.m. EXAM: CT HEAD WITHOUT CONTRAST TECHNIQUE: Contiguous axial images were obtained from the base of the skull through the vertex without intravenous contrast. COMPARISON:  07/22/2013 FINDINGS: Brain: No acute infarct or hemorrhage. Lateral ventricles and midline structures are unremarkable. No acute extra-axial fluid collections. No mass effect. Vascular: No hyperdense vessel or unexpected calcification. Skull: Normal. Negative for fracture or focal lesion. Sinuses/Orbits: No acute finding. Other: None. IMPRESSION: 1. No acute intracranial process. Electronically Signed   By: Randa Ngo M.D.   On: 04/19/2020 21:30    Procedures Procedures (including critical care time)  Medications Ordered in ED Medications  sodium chloride flush (NS) 0.9 % injection 3 mL (has no administration in time range)    ED Course  I have reviewed the triage vital signs and the nursing notes.  Pertinent labs & imaging results that were available during my care of the patient were reviewed by me and considered in my medical decision making (see chart for details).    MDM Rules/Calculators/A&P                         Additional history obtained from: 1. Nursing notes from this visit. 2. Care taker. ------------------------ Lab work obtained in triage reviewed, [place] shows no emergent electrolyte derangement, AKI, LFT elevation or gap.  aPTT and PT/INR within normal limits.  CBC shows no leukocytosis to suggest infection, hemoglobin 12.1.  CBG 102.  CT head noncontrast showed no acute intracranial  process.  Based on history obtained from caretaker as well as a limited exam concern for possible CVA as cause  of left arm/hand weakness.  Will obtain MRI brain and cervical spine.  Discussed with Dr. Rex Kras who agrees with plan.  Care handoff given to Benedetto Goad, PA-C at shift change, final disposition per oncoming team.  Note: Portions of this report may have been transcribed using voice recognition software. Every effort was made to ensure accuracy; however, inadvertent computerized transcription errors may still be present. Final Clinical Impression(s) / ED Diagnoses Final diagnoses:  None    Rx / DC Orders ED Discharge Orders    None       Gari Crown 04/20/20 1357    Little, Wenda Overland, MD 04/21/20 1155

## 2020-04-20 NOTE — Consult Note (Addendum)
Neurology Consultation  Reason for Consult: Inability to extend wrist and fingers Referring Physician: Dr. Rex Kras  CC: Possible radial nerve palsy  History is obtained from: Chart and caregiver  HPI: Fred [name] is a 69 y.o. male with past medical history of diabetes, hypertension, mental retardation.  Patient lives in a group home however over the weekend he went to see his family.  Before leaving the group home he had equal grip strengths and no unilateral abnormalities.  On Sunday when arriving back to the group home it was noticed that patient was unable to extend his left wrist and extend his fingers.  Secondary to these findings group home was concerned and sent him to the emergency department.  Patient obtained an MRI of brain and also spine which were negative.  On exam he is unable to give me a good history.  Caregiver does state that he usually sleeps on his left side but is never had a issue with his wrist and/or arm the next morning.  He cannot give a good history as he is unclear of what happened while patient was with the family.  Past Medical History:  Diagnosis Date  . Diabetes mellitus   . Hypertension   . Mental retardation     No family history as patient is extremely lethargic and unable to provide.   Social History:   reports that he has been smoking. He has never used smokeless tobacco. He reports that he does not drink alcohol and does not use drugs.  Medications  Current Facility-Administered Medications:  .  sodium chloride flush (NS) 0.9 % injection 3 mL, 3 mL, Intravenous, Once, Little, Wenda Overland, MD  Current Outpatient Medications:  .  alfuzosin (UROXATRAL) 10 MG 24 hr tablet, Take 10 mg by mouth daily after breakfast. , Disp: , Rfl:  .  amLODipine (NORVASC) 10 MG tablet, Take 10 mg by mouth daily. , Disp: , Rfl:  .  aspirin EC 81 MG tablet, Take 81 mg by mouth daily., Disp: , Rfl:  .  atorvastatin (LIPITOR) 20 MG tablet, Take 20 mg by mouth daily. ,  Disp: , Rfl:  .  benztropine (COGENTIN) 0.5 MG tablet, Take 0.5 mg by mouth daily. , Disp: , Rfl:  .  carvedilol (COREG) 12.5 MG tablet, Take 12.5 mg by mouth 2 (two) times daily with a meal. , Disp: , Rfl:  .  ferrous sulfate 325 (65 FE) MG tablet, Take 325 mg by mouth every Monday, Wednesday, and Friday. , Disp: , Rfl:  .  lisinopril-hydrochlorothiazide (PRINZIDE,ZESTORETIC) 20-25 MG per tablet, Take 2 tablets by mouth daily. , Disp: , Rfl:  .  metFORMIN (GLUCOPHAGE) 500 MG tablet, Take 500 mg by mouth 2 (two) times daily. , Disp: , Rfl:  .  OVER THE COUNTER MEDICATION, Take 10 mg by mouth daily. "allergy tab", Disp: , Rfl:  .  potassium chloride (K-DUR) 10 MEQ tablet, Take 10 mEq by mouth daily., Disp: , Rfl:  .  risperiDONE (RISPERDAL) 0.5 MG tablet, Take 0.5 mg by mouth daily., Disp: , Rfl:  .  risperiDONE (RISPERDAL) 3 MG tablet, Take 3 mg by mouth at bedtime. , Disp: , Rfl:  .  sertraline (ZOLOFT) 50 MG tablet, Take 50 mg by mouth daily., Disp: , Rfl:  .  glucose monitoring kit (FREESTYLE) monitoring kit, Please dispense whichever glucometer is covered by pt's insurance with lancets and strips also; check blood sugar as directed, Disp: , Rfl:  .  Lancets Misc. (ACCU-CHEK SOFTCLIX LANCET  DEV) KIT, by Does not apply route., Disp: , Rfl:   [id number] to obtain due to unable to provide any history   Exam: Current vital signs: BP 136/65   Pulse 66   Temp 98.1 F (36.7 C) (Oral)   Resp 15   SpO2 99%  Vital signs in last 24 hours: Temp:  [98 F (36.7 C)-98.7 F (37.1 C)] 98.1 F (36.7 C) (08/23 1032) Pulse Rate:  [58-73] 66 (08/23 1630) Resp:  [11-18] 15 (08/23 1630) BP: (116-161)/(65-138) 136/65 (08/23 1630) SpO2:  [97 %-100 %] 99 % (08/23 1630)   Constitutional: Appears well-developed and well-nourished.  Eyes: No scleral injection HENT: No OP obstrucion Head: Normocephalic.  Cardiovascular: Palpable.  Respiratory: Effort normal, non-labored breathing GI: Soft.  No  distension. There is no tenderness.  Skin: WDI  Neuro: Mental Status: Patient is drowsy, due to significant drowsiness he is unable to tell me where he is, and why he is at the hospital.  He is nonvocal at this time only grunting.  He does follow very simple commands however at times he is having difficulty understanding what I am asking even with caregiver at bedside attempting to have him follow commands.  Cranial Nerves: II: Blinks to threat III,IV, VI: EOMI without ptosis or diploplia. Pupils equal, round and reactive to light V: Facial sensation-responds to noxious stimuli VII: Facial movement is symmetric.  VIII: hearing is intact to voice Motor: Right arm and leg have 5/5 strength.  Left arm as below.  Left leg has full 4/5 strength, patient does have a weak left bicep flexion.  I cannot get him to do pronation or supination.  Patient also at one point showed me a strong tricep extension and also has strong left shoulder abduction. Sensory: Sensation is intact on right upper and lower extremities to noxious stimuli.  Intact in left lower extremity to noxious stimuli.  On the left arm patient has no response to noxious stimuli until I get to upper forearm and bicep twitch she winces. Deep Tendon Reflexes: 2+ and symmetric in the biceps and patellae on the right.  I cannot elicit a bicep, tricep or  brachial radialis on the left he does have a left knee jerk plantars: Toes are downgoing bilaterally.  Cerebellar: Unable to examine  Labs I have reviewed labs in epic and the results pertinent to this consultation are:  CBC    Component Value Date/Time   WBC 6.2 04/19/2020 2008   RBC 4.17 (L) 04/19/2020 2008   HGB 12.6 (L) 04/19/2020 2146   HCT 37.0 (L) 04/19/2020 2146   PLT 162 04/19/2020 2008   MCV 91.8 04/19/2020 2008   MCH 29.0 04/19/2020 2008   MCHC 31.6 04/19/2020 2008   RDW 14.1 04/19/2020 2008   LYMPHSABS 1.4 04/19/2020 2008   MONOABS 0.5 04/19/2020 2008   EOSABS 0.1  04/19/2020 2008   BASOSABS 0.0 04/19/2020 2008    CMP     Component Value Date/Time   NA 143 04/19/2020 2146   K 3.2 (L) 04/19/2020 2146   CL 102 04/19/2020 2146   CO2 28 04/19/2020 2008   GLUCOSE 109 (H) 04/19/2020 2146   BUN 17 04/19/2020 2146   CREATININE 1.00 04/19/2020 2146   CALCIUM 9.6 04/19/2020 2008   PROT 7.4 04/19/2020 2008   ALBUMIN 4.0 04/19/2020 2008   AST 14 (L) 04/19/2020 2008   ALT 18 04/19/2020 2008   ALKPHOS 63 04/19/2020 2008   BILITOT 0.2 (L) 04/19/2020 2008   GFRNONAA >60  04/19/2020 2008   GFRAA >60 04/19/2020 2008    Lipid Panel  No results found for: CHOL, TRIG, HDL, CHOLHDL, VLDL, LDLCALC, LDLDIRECT   Imaging I have reviewed the images obtained:  CT-scan of the brain-no acute intracranial process  MRI examination of the brain and cervical spine.-No acute intracranial abnormalities, mild chronic small vessel ischemic disease, motion degraded cervical spine MRI.  C3-4 disc degeneration with mild spinal stenosis and moderate right neuroforaminal stenosis.  Etta Quill PA-C [facility] [phone]  M-F  (9:00 am- 5:00 PM)  04/20/2020, 4:43 PM     Assessment:  This is a 70-year-old male who resides at a group home.  As noted above patient went to see his family over the weekend and when returned to group home was noted to have a wrist drop along with inability to extend fingers.  It is unclear what might of happened such as if he fell asleep with his arm hung over an object.  Any emergent findings such as a stroke and or cervical spine injury has been cleared by MRI.  Given the fact that exam was difficult is hard to pinpoint whether this is a pure radial nerve palsy or brachial plexopathy.  However that said patient does not have reflexes in the see 6, 5/6 and C7 region.  In addition patient does have a weaker bicep flexion.  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  Impression: Brachial plexopathy  Recommendations: -Cock-up wrist splint -I have explained to the caregiver to continue to watch and test patient's left wrist extension and finger extension. -If no better in 2 weeks would have patient follow-up with outpatient neurology for a EMG/NCV  -I do not believe an MRI of the brachial plexus would need to be emergent at this time.  It may be warranted if no improvement at all in the next day or so.  At that point the group home physician can order a left brachial plexus MRI without contrast.  The patient was discharged from the ED before he could be assessed by the Neurology attending. No charge.

## 2020-04-20 NOTE — Discharge Instructions (Signed)
Your work-up today has been reassuring, MRI does not suggest this is a stroke or a problem with the neck.  Neurology suspects this is a problem with the brachial plexus and it may improve just with time, but if not you will need to follow-up with neurology for further evaluation.  Please wear wrist splint for most of the day, especially when sleeping patient can take it off to get the skin to break intermittently as needed.

## 2020-06-24 ENCOUNTER — Encounter: Payer: Self-pay | Admitting: Diagnostic Neuroimaging

## 2020-06-24 ENCOUNTER — Ambulatory Visit (INDEPENDENT_AMBULATORY_CARE_PROVIDER_SITE_OTHER): Payer: Medicare Other | Admitting: Diagnostic Neuroimaging

## 2020-06-24 VITALS — BP 121/67 | HR 66 | Wt 145.0 lb

## 2020-06-24 DIAGNOSIS — R29898 Other symptoms and signs involving the musculoskeletal system: Secondary | ICD-10-CM

## 2020-06-24 NOTE — Progress Notes (Signed)
[facility]  PATIENT: Fred [name] DOB: February 14, 1951  REFERRING CLINICIAN: Jacqlyn Larsen, PA-C HISTORY FROM: patient  REASON FOR VISIT: new consult    HISTORICAL  CHIEF COMPLAINT:  Chief Complaint  Patient presents with  . Brachial plexopathy    rm 7 New Pt, caregiver- Tameka    HISTORY OF PRESENT ILLNESS:   69 year old male with intellectual disability, hypertension and diabetes, here for evaluation of left arm weakness.  04/18/2020 patient had spent the night at family members house on a new mattress.  The next morning when he woke up he had weakness of his left arm.  He presented to the hospital on 04/19/2020 for evaluation.  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  Since that time symptoms have significant proved.  Left arm is almost back to baseline.  Patient is calm, relaxed and has no complaints.   REVIEW OF SYSTEMS: Full 14 system review of systems performed and negative with exception of: As per HPI.  ALLERGIES: No Known Allergies  HOME MEDICATIONS: Outpatient Medications Prior to Visit  Medication Sig Dispense Refill  . alfuzosin (UROXATRAL) 10 MG 24 hr tablet Take 10 mg by mouth daily after breakfast.     . amLODipine (NORVASC) 10 MG tablet Take 10 mg by mouth daily.     Marland Kitchen aspirin EC 81 MG tablet Take 81 mg by mouth daily.    Marland Kitchen atorvastatin (LIPITOR) 20 MG tablet Take 20 mg by mouth daily.     . benztropine (COGENTIN) 0.5 MG tablet Take 0.5 mg by mouth daily.     . carvedilol (COREG) 12.5 MG tablet Take 12.5 mg by mouth 2 (two) times daily with a meal.     . ferrous sulfate 325 (65 FE) MG tablet Take 325 mg by mouth every Monday, Wednesday, and Friday.     Marland Kitchen glucose monitoring kit (FREESTYLE) monitoring kit Please dispense whichever glucometer is covered by pt's insurance with lancets and strips also; check blood sugar as directed    . Lancets Misc. (ACCU-CHEK SOFTCLIX LANCET DEV) KIT by Does not  apply route.    Marland Kitchen lisinopril-hydrochlorothiazide (PRINZIDE,ZESTORETIC) 20-25 MG per tablet Take 2 tablets by mouth daily.     . metFORMIN (GLUCOPHAGE) 500 MG tablet Take 500 mg by mouth 2 (two) times daily.     Marland Kitchen OVER THE COUNTER MEDICATION Take 10 mg by mouth daily. "allergy tab"    . potassium chloride (K-DUR) 10 MEQ tablet Take 10 mEq by mouth daily.    . risperiDONE (RISPERDAL) 0.5 MG tablet Take 0.5 mg by mouth daily.    . risperiDONE (RISPERDAL) 3 MG tablet Take 3 mg by mouth at bedtime.     . sertraline (ZOLOFT) 50 MG tablet Take 50 mg by mouth daily.     No facility-administered medications prior to visit.    PAST MEDICAL HISTORY: Past Medical History:  Diagnosis Date  . Diabetes mellitus   . Hypertension   . Mental retardation     PAST SURGICAL HISTORY: No past surgical history on file.  FAMILY HISTORY: Family History  Family history unknown: Yes    SOCIAL HISTORY: Social History   Socioeconomic History  . Marital status: Single    Spouse name: Not on file  . Number of children: 0  . Years of education: Not on file  . Highest education level: Not on file  Occupational History  . Not on file  Tobacco Use  . Smoking status: Former Smoker  Quit date: 06/24/2016    Years since quitting: 4.0  . Smokeless tobacco: Never Used  Substance and Sexual Activity  . Alcohol use: No  . Drug use: No  . Sexual activity: Not on file  Other Topics Concern  . Not on file  Social History Narrative   06/24/20 lives at home, has caregivers, guardian Juleen China   Social Determinants of Health   Financial Resource Strain:   . Difficulty of Paying Living Expenses: Not on file  Food Insecurity:   . Worried About Charity fundraiser in the Last Year: Not on file  . Ran Out of Food in the Last Year: Not on file  Transportation Needs:   . Lack of Transportation (Medical): Not on file  . Lack of Transportation (Non-Medical): Not on file  Physical Activity:   . Days of  Exercise per Week: Not on file  . Minutes of Exercise per Session: Not on file  Stress:   . Feeling of Stress : Not on file  Social Connections:   . Frequency of Communication with Friends and Family: Not on file  . Frequency of Social Gatherings with Friends and Family: Not on file  . Attends Religious Services: Not on file  . Active Member of Clubs or Organizations: Not on file  . Attends Archivist Meetings: Not on file  . Marital Status: Not on file  Intimate Partner Violence:   . Fear of Current or Ex-Partner: Not on file  . Emotionally Abused: Not on file  . Physically Abused: Not on file  . Sexually Abused: Not on file     PHYSICAL EXAM  GENERAL EXAM/CONSTITUTIONAL: Vitals:  Vitals:   06/24/20 0951  BP: 121/67  Pulse: 66  Weight: 145 lb (65.8 kg)     There is no height or weight on file to calculate BMI. Wt Readings from Last 3 Encounters:  06/24/20 145 lb (65.8 kg)  07/22/13 157 lb (71.2 kg)     Patient is in no distress; well developed, nourished and groomed; neck is supple  CARDIOVASCULAR:  Examination of carotid arteries is normal; no carotid bruits  Regular rate and rhythm, no murmurs  Examination of peripheral vascular system by observation and palpation is normal  EYES:  Ophthalmoscopic exam of optic discs and posterior segments is normal; no papilledema or hemorrhages  No exam data present  MUSCULOSKELETAL:  Gait, strength, tone, movements noted in Neurologic exam below  NEUROLOGIC: MENTAL STATUS:  No flowsheet data found.  awake, alert, oriented to person  Hamel COMMANDS AND IMITATES; CANNOT FOLLOW MORE COMPLEX COMMANDS   CRANIAL NERVE:   2nd - no papilledema on fundoscopic exam  2nd, 3rd, 4th, 6th - pupils equal and reactive to light, visual fields full to confrontation, extraocular muscles intact, no nystagmus  5th - facial sensation symmetric  7th - facial strength  symmetric  8th - hearing intact  9th - palate elevates symmetrically, uvula midline  11th - shoulder shrug symmetric  12th - tongue protrusion midline  DYSARTHRIA  MOTOR:   normal bulk and tone, SYMM 4/5 strength in the BUE, BLE; NOT FOLLOWING COMMANDS FOR DETAILED TESTING OR FULL EFFORT  ABLE TO SHUFFLE CARDS WITH DEXTERITY IN BILATERAL HANDS  SENSORY:   normal and symmetric to light touch  COORDINATION:   finger-nose-finger, fine finger movements normal  REFLEXES:   deep tendon reflexes TRACE and symmetric  GAIT/STATION:   narrow based gait     DIAGNOSTIC DATA (  LABS, IMAGING, TESTING) - I reviewed patient records, labs, notes, testing and imaging myself where available.  Lab Results  Component Value Date   WBC 6.2 04/19/2020   HGB 12.6 (L) 04/19/2020   HCT 37.0 (L) 04/19/2020   MCV 91.8 04/19/2020   PLT 162 04/19/2020      Component Value Date/Time   NA 143 04/19/2020 2146   K 3.2 (L) 04/19/2020 2146   CL 102 04/19/2020 2146   CO2 28 04/19/2020 2008   GLUCOSE 109 (H) 04/19/2020 2146   BUN 17 04/19/2020 2146   CREATININE 1.00 04/19/2020 2146   CALCIUM 9.6 04/19/2020 2008   PROT 7.4 04/19/2020 2008   ALBUMIN 4.0 04/19/2020 2008   AST 14 (L) 04/19/2020 2008   ALT 18 04/19/2020 2008   ALKPHOS 63 04/19/2020 2008   BILITOT 0.2 (L) 04/19/2020 2008   GFRNONAA >60 04/19/2020 2008   GFRAA >60 04/19/2020 2008   No results found for: CHOL, HDL, LDLCALC, LDLDIRECT, TRIG, CHOLHDL No results found for: HGBA1C No results found for: VITAMINB12 No results found for: TSH    04/20/20 MRI brain / cervical  1. No acute intracranial abnormality. 2. Mild chronic small vessel ischemic disease. 3. Motion degraded cervical spine MRI. 4. 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   ASSESSMENT AND PLAN  69 y.o. year old male here with:  Dx:  1. 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 Continue supportive care  Return for return to PCP.    Penni Bombard, MD 97/91/5041, 36:43 AM Certified in Neurology, Neurophysiology and Neuroimaging  [facility] [address] [place], [place] [address] [phone]

## 2020-06-24 NOTE — Patient Instructions (Signed)
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

## 2020-08-11 ENCOUNTER — Ambulatory Visit (INDEPENDENT_AMBULATORY_CARE_PROVIDER_SITE_OTHER): Payer: Medicare Other | Admitting: Podiatry

## 2020-08-11 ENCOUNTER — Other Ambulatory Visit: Payer: Self-pay

## 2020-08-11 DIAGNOSIS — M79674 Pain in right toe(s): Secondary | ICD-10-CM | POA: Diagnosis not present

## 2020-08-11 DIAGNOSIS — E119 Type 2 diabetes mellitus without complications: Secondary | ICD-10-CM

## 2020-08-11 DIAGNOSIS — M79675 Pain in left toe(s): Secondary | ICD-10-CM | POA: Diagnosis not present

## 2020-08-11 DIAGNOSIS — B351 Tinea unguium: Secondary | ICD-10-CM

## 2020-08-11 NOTE — Progress Notes (Signed)
  Subjective:  Patient ID: Fred [name], male    DOB: May 20, 1951,  MRN: [id number]  Chief Complaint  Patient presents with  . Nail Problem    Nail trim 1-5 bilateral   69 y.o. male presents with the above complaint. History confirmed with patient.   Objective:  Physical Exam: warm, good capillary refill, nail exam onychomycosis of the toenails, no trophic changes or ulcerative lesions. DP pulses palpable, PT pulses palpable and protective sensation intact Left Foot: normal exam, no swelling, tenderness, instability; ligaments intact, full range of motion of all ankle/foot joints  Right Foot: normal exam, no swelling, tenderness, instability; ligaments intact, full range of motion of all ankle/foot joints   No images are attached to the encounter.  Assessment:   1. Type 2 diabetes mellitus without complication, without long-term current use of insulin (HCC)   2. Pain due to onychomycosis of toenails of both feet    Plan:  Patient was evaluated and treated and all questions answered.  Onychomycosis and Diabetes -Nails palliatively debrided secondary to pain  Procedure: Nail Debridement Type of Debridement: manual, sharp debridement. Instrumentation: Nail nipper, rotary burr. Number of Nails: 10  Return in about 4 months (around 12/10/2020) for Diabetic Foot Care.

## 2020-12-15 ENCOUNTER — Ambulatory Visit: Payer: Medicare Other | Admitting: Podiatry

## 2020-12-18 ENCOUNTER — Ambulatory Visit (INDEPENDENT_AMBULATORY_CARE_PROVIDER_SITE_OTHER): Payer: Medicare Other | Admitting: Podiatry

## 2020-12-18 ENCOUNTER — Other Ambulatory Visit: Payer: Self-pay

## 2020-12-18 DIAGNOSIS — E119 Type 2 diabetes mellitus without complications: Secondary | ICD-10-CM

## 2020-12-18 DIAGNOSIS — B351 Tinea unguium: Secondary | ICD-10-CM

## 2020-12-18 DIAGNOSIS — M79675 Pain in left toe(s): Secondary | ICD-10-CM | POA: Diagnosis not present

## 2020-12-18 DIAGNOSIS — M79674 Pain in right toe(s): Secondary | ICD-10-CM

## 2020-12-31 NOTE — Progress Notes (Signed)
  Subjective:  Patient ID: Fred [name], male    DOB: [birth year],  MRN: [id number]  Chief Complaint  Patient presents with  . Nail Problem    Thick painful toenails  . Diabetes   70 y.o. male presents with the above complaint. History confirmed with patient.   Objective:  Physical Exam: warm, good capillary refill, nail exam onychomycosis of the toenails, no trophic changes or ulcerative lesions. DP pulses palpable, PT pulses palpable and protective sensation intact Left Foot: normal exam, no swelling, tenderness, instability; ligaments intact, full range of motion of all ankle/foot joints  Right Foot: normal exam, no swelling, tenderness, instability; ligaments intact, full range of motion of all ankle/foot joints   No images are attached to the encounter.  Assessment:   1. Type 2 diabetes mellitus without complication, without long-term current use of insulin (HCC)   2. Pain due to onychomycosis of toenails of both feet    Plan:  Patient was evaluated and treated and all questions answered.  Onychomycosis and Diabetes -Nails palliatively debrided secondary to pain  Procedure: Nail Debridement Type of Debridement: manual, sharp debridement. Instrumentation: Nail nipper, rotary burr. Number of Nails: 10   Return in about 3 months (around 03/19/2021) for diabetic nail trim.

## 2021-03-19 ENCOUNTER — Other Ambulatory Visit: Payer: Self-pay

## 2021-03-19 ENCOUNTER — Ambulatory Visit (INDEPENDENT_AMBULATORY_CARE_PROVIDER_SITE_OTHER): Payer: Medicare Other | Admitting: Podiatry

## 2021-03-19 DIAGNOSIS — M79675 Pain in left toe(s): Secondary | ICD-10-CM | POA: Diagnosis not present

## 2021-03-19 DIAGNOSIS — M79674 Pain in right toe(s): Secondary | ICD-10-CM | POA: Diagnosis not present

## 2021-03-19 DIAGNOSIS — B351 Tinea unguium: Secondary | ICD-10-CM | POA: Diagnosis not present

## 2021-03-19 DIAGNOSIS — E119 Type 2 diabetes mellitus without complications: Secondary | ICD-10-CM

## 2021-03-19 NOTE — Progress Notes (Signed)
  Subjective:  Patient ID: Fred [name], male    DOB: April 20, 1951,  MRN: [id number]  Chief Complaint  Patient presents with   debride    DFC -FBS: not taken -A1C: 5.9 PCP: [facility] x 1 mo   70 y.o. male presents with the above complaint. History confirmed with patient.   Objective:  Physical Exam: warm, good capillary refill, nail exam onychomycosis of the toenails, no trophic changes or ulcerative lesions. DP pulses palpable, PT pulses palpable and protective sensation intact Left Foot: normal exam, no swelling, tenderness, instability; ligaments intact, full range of motion of all ankle/foot joints  Right Foot: normal exam, no swelling, tenderness, instability; ligaments intact, full range of motion of all ankle/foot joints   No images are attached to the encounter.  Assessment:   1. Type 2 diabetes mellitus without complication, without long-term current use of insulin (HCC)   2. Pain due to onychomycosis of toenails of both feet    Plan:  Patient was evaluated and treated and all questions answered.  Onychomycosis and Diabetes -Palliative debridement of nails  Procedure: Nail Debridement Type of Debridement: manual, sharp debridement. Instrumentation: Nail nipper, rotary burr. Number of Nails: 10      Return in about 3 months (around 06/19/2021) for Diabetic Foot Care.

## 2021-06-22 ENCOUNTER — Other Ambulatory Visit: Payer: Self-pay

## 2021-06-22 ENCOUNTER — Ambulatory Visit (INDEPENDENT_AMBULATORY_CARE_PROVIDER_SITE_OTHER): Payer: Medicare Other | Admitting: Podiatry

## 2021-06-22 ENCOUNTER — Ambulatory Visit: Payer: Medicare Other | Admitting: Podiatry

## 2021-06-22 DIAGNOSIS — E119 Type 2 diabetes mellitus without complications: Secondary | ICD-10-CM | POA: Diagnosis not present

## 2021-06-22 DIAGNOSIS — B351 Tinea unguium: Secondary | ICD-10-CM

## 2021-06-22 DIAGNOSIS — M79674 Pain in right toe(s): Secondary | ICD-10-CM | POA: Diagnosis not present

## 2021-06-22 DIAGNOSIS — M79675 Pain in left toe(s): Secondary | ICD-10-CM | POA: Diagnosis not present

## 2021-06-22 NOTE — Progress Notes (Signed)
  Subjective:  Patient ID: Fred [name], male    DOB: March 20, 1951,  MRN: [id number]  Chief Complaint  Patient presents with   Diabetes    Routine diabetic foot exam. Nail trim needed     70 y.o. male presents with the above complaint. History confirmed with patient. States the nails cause him pain.  Objective:  Physical Exam: warm, good capillary refill, nail exam onychomycosis of the toenails, no trophic changes or ulcerative lesions. DP pulses palpable, PT pulses palpable and protective sensation intact Left Foot: normal exam, no swelling, tenderness, instability; ligaments intact, full range of motion of all ankle/foot joints  Right Foot: normal exam, no swelling, tenderness, instability; ligaments intact, full range of motion of all ankle/foot joints   No images are attached to the encounter.  Assessment:   1. Type 2 diabetes mellitus without complication, without long-term current use of insulin (HCC)   2. Pain due to onychomycosis of toenails of both feet     Plan:  Patient was evaluated and treated and all questions answered.  Onychomycosis and Diabetes -Debridement of nails x10  Procedure: Nail Debridement Type of Debridement: manual, sharp debridement. Instrumentation: Nail nipper, rotary burr. Number of Nails: 10   No follow-ups on file.

## 2021-09-21 ENCOUNTER — Ambulatory Visit (INDEPENDENT_AMBULATORY_CARE_PROVIDER_SITE_OTHER): Payer: Medicare Other | Admitting: Podiatry

## 2021-09-21 DIAGNOSIS — Z91199 Patient's noncompliance with other medical treatment and regimen due to unspecified reason: Secondary | ICD-10-CM

## 2021-09-21 NOTE — Progress Notes (Signed)
   Complete physical exam  Patient: Fred [name]   DOB: 06/18/2000   71 y.o. Male  MRN: [id number]  Subjective:    No chief complaint on file.   Fred [name] is a 71 y.o. male who presents today for a complete physical exam. She reports consuming a {diet types:17450} diet. {types:19826} She generally feels {DESC; WELL/FAIRLY WELL/POORLY:18703}. She reports sleeping {DESC; WELL/FAIRLY WELL/POORLY:18703}. She {does/does not:200015} have additional problems to discuss today.    Most recent fall risk assessment:    02/23/2022   10:42 AM  Fall Risk   Falls in the past year? 0  Number falls in past yr: 0  Injury with Fall? 0  Risk for fall due to : No Fall Risks  Follow up Falls evaluation completed     Most recent depression screenings:    02/23/2022   10:42 AM 01/14/2021   10:46 AM  PHQ 2/9 Scores  PHQ - 2 Score 0 0  PHQ- 9 Score 5     {VISON DENTAL STD PSA (Optional):27386}  {History (Optional):23778}  Patient Care Team: Jessup, Joy, NP as PCP - General (Nurse Practitioner)   Outpatient Medications Prior to Visit  Medication Sig   fluticasone (FLONASE) 50 MCG/ACT nasal spray Place 2 sprays into both nostrils in the morning and at bedtime. After 7 days, reduce to once daily.   norgestimate-ethinyl estradiol (SPRINTEC 28) 0.25-35 MG-MCG tablet Take 1 tablet by mouth daily.   Nystatin POWD Apply liberally to affected area 2 times per day   spironolactone (ALDACTONE) 100 MG tablet Take 1 tablet (100 mg total) by mouth daily.   No facility-administered medications prior to visit.    ROS        Objective:     There were no vitals taken for this visit. {Vitals History (Optional):23777}  Physical Exam   No results found for any visits on 03/31/22. {Show previous labs (optional):23779}    Assessment & Plan:    Routine Health Maintenance and Physical Exam  Immunization History  Administered Date(s) Administered   DTaP 09/01/1999, 10/28/1999,  01/06/2000, 09/21/2000, 04/06/2004   Hepatitis A 02/01/2008, 02/06/2009   Hepatitis B 06/19/1999, 07/27/1999, 01/06/2000   HiB (PRP-OMP) 09/01/1999, 10/28/1999, 01/06/2000, 09/21/2000   IPV 09/01/1999, 10/28/1999, 06/26/2000, 04/06/2004   Influenza,inj,Quad PF,6+ Mos 05/09/2014   Influenza-Unspecified 08/08/2012   MMR 06/26/2001, 04/06/2004   Meningococcal Polysaccharide 02/06/2012   Pneumococcal Conjugate-13 09/21/2000   Pneumococcal-Unspecified 01/06/2000, 03/21/2000   Tdap 02/06/2012   Varicella 06/26/2000, 02/01/2008    Health Maintenance  Topic Date Due   HIV Screening  Never done   Hepatitis C Screening  Never done   INFLUENZA VACCINE  03/29/2022   PAP-Cervical Cytology Screening  03/31/2022 (Originally 06/17/2020)   PAP SMEAR-Modifier  03/31/2022 (Originally 06/17/2020)   TETANUS/TDAP  03/31/2022 (Originally 02/05/2022)   HPV VACCINES  Discontinued   COVID-19 Vaccine  Discontinued    Discussed health benefits of physical activity, and encouraged her to engage in regular exercise appropriate for her age and condition.  Problem List Items Addressed This Visit   None Visit Diagnoses     Annual physical exam    -  Primary   Cervical cancer screening       Need for Tdap vaccination          No follow-ups on file.     Joy Jessup, NP   

## 2021-09-24 ENCOUNTER — Ambulatory Visit: Payer: Medicare Other | Admitting: Podiatry

## 2021-10-05 ENCOUNTER — Ambulatory Visit (INDEPENDENT_AMBULATORY_CARE_PROVIDER_SITE_OTHER): Payer: Medicare Other | Admitting: Podiatry

## 2021-10-05 ENCOUNTER — Other Ambulatory Visit: Payer: Self-pay

## 2021-10-05 DIAGNOSIS — E119 Type 2 diabetes mellitus without complications: Secondary | ICD-10-CM | POA: Diagnosis not present

## 2021-10-05 DIAGNOSIS — B351 Tinea unguium: Secondary | ICD-10-CM

## 2021-10-05 DIAGNOSIS — M79674 Pain in right toe(s): Secondary | ICD-10-CM | POA: Diagnosis not present

## 2021-10-05 DIAGNOSIS — M79675 Pain in left toe(s): Secondary | ICD-10-CM

## 2021-10-05 NOTE — Progress Notes (Signed)
°  Subjective:  Patient ID: Fred [name], male    DOB: 02-19-1951,  MRN: [id number]  Chief Complaint  Patient presents with   Nail Problem    Diabetic foot care Nail trim  Callus debridement-    71 y.o. male presents with the above complaint. History confirmed with patient. Nails cause him pain when they get long especially the left great toenail.  Last saw his PCP Dr. Everlene Other 2-3 months ago. Last A1c 5.9. Does not check blood sugars regularly in the AM.  Objective:  Physical Exam: warm, good capillary refill, nail exam onychomycosis of the toenails, no trophic changes or ulcerative lesions. DP pulses palpable, PT pulses palpable and protective sensation intact Left Foot: normal exam, no swelling, tenderness, instability; ligaments intact, full range of motion of all ankle/foot joints  Right Foot: normal exam, no swelling, tenderness, instability; ligaments intact, full range of motion of all ankle/foot joints   No images are attached to the encounter.  Assessment:   1. Type 2 diabetes mellitus without complication, without long-term current use of insulin (HCC)   2. Pain due to onychomycosis of toenails of both feet    Plan:  Patient was evaluated and treated and all questions answered.  Onychomycosis and Diabetes -Nails debrided x10  Procedure: Nail Debridement Type of Debridement: manual, sharp debridement. Instrumentation: Nail nipper, rotary burr. Number of Nails: 10 Disposition: Patient tolerated well without iatrogenic injury.   Return in about 4 months (around 02/02/2022) for Diabetic Foot Care.

## 2021-12-29 DIAGNOSIS — L853 Xerosis cutis: Secondary | ICD-10-CM | POA: Insufficient documentation

## 2022-02-04 ENCOUNTER — Ambulatory Visit: Payer: Medicare Other | Admitting: Podiatry

## 2022-02-15 ENCOUNTER — Ambulatory Visit (INDEPENDENT_AMBULATORY_CARE_PROVIDER_SITE_OTHER): Payer: Medicare Other | Admitting: Podiatry

## 2022-02-15 ENCOUNTER — Encounter: Payer: Self-pay | Admitting: Podiatry

## 2022-02-15 DIAGNOSIS — B351 Tinea unguium: Secondary | ICD-10-CM

## 2022-02-15 DIAGNOSIS — E119 Type 2 diabetes mellitus without complications: Secondary | ICD-10-CM

## 2022-02-15 DIAGNOSIS — M79675 Pain in left toe(s): Secondary | ICD-10-CM | POA: Diagnosis not present

## 2022-02-15 DIAGNOSIS — M79674 Pain in right toe(s): Secondary | ICD-10-CM

## 2022-02-15 NOTE — Progress Notes (Signed)
This patient returns to my office for at risk foot care.  This patient requires this care by a professional since this patient will be at risk due to having diabetes.  patient is unable to cut nails himself since the patient cannot reach his nails.These nails are painful walking and wearing shoes.  Patient presents to the office with a caregiver. This patient presents for at risk foot care today.  General Appearance  Alert, conversant and in no acute stress.  Vascular  Dorsalis pedis and posterior tibial  pulses are palpable  bilaterally.  Capillary return is within normal limits  bilaterally. Temperature is within normal limits  bilaterally.  Neurologic  Senn-Weinstein monofilament wire test within normal limits  bilaterally. Muscle power within normal limits bilaterally.  Nails Thick disfigured discolored nails with subungual debris  from hallux to fifth toes bilaterally. No evidence of bacterial infection or drainage bilaterally.  Orthopedic  No limitations of motion  feet .  No crepitus or effusions noted.  No bony pathology or digital deformities noted.  Skin  normotropic skin with no porokeratosis noted bilaterally.  No signs of infections or ulcers noted.     Onychomycosis  Pain in right toes  Pain in left toes  Consent was obtained for treatment procedures.   Mechanical debridement of nails 1-5  bilaterally performed with a nail nipper.  Filed with dremel without incident.    Return office visit    4 months                 Told patient to return for periodic foot care and evaluation due to potential at risk complications.   Danaisha Celli DPM   diabetes. 

## 2022-05-24 ENCOUNTER — Encounter: Payer: Self-pay | Admitting: Podiatry

## 2022-05-24 ENCOUNTER — Ambulatory Visit (INDEPENDENT_AMBULATORY_CARE_PROVIDER_SITE_OTHER): Payer: Medicare Other | Admitting: Podiatry

## 2022-05-24 DIAGNOSIS — E119 Type 2 diabetes mellitus without complications: Secondary | ICD-10-CM

## 2022-05-24 DIAGNOSIS — B351 Tinea unguium: Secondary | ICD-10-CM | POA: Diagnosis not present

## 2022-05-24 DIAGNOSIS — M79674 Pain in right toe(s): Secondary | ICD-10-CM

## 2022-05-24 DIAGNOSIS — M79675 Pain in left toe(s): Secondary | ICD-10-CM | POA: Diagnosis not present

## 2022-05-24 NOTE — Progress Notes (Signed)
This patient returns to my office for at risk foot care.  This patient requires this care by a professional since this patient will be at risk due to having diabetes.  patient is unable to cut nails himself since the patient cannot reach his nails.These nails are painful walking and wearing shoes.  Patient presents to the office with a caregiver. This patient presents for at risk foot care today.  General Appearance  Alert, conversant and in no acute stress.  Vascular  Dorsalis pedis and posterior tibial  pulses are palpable  bilaterally.  Capillary return is within normal limits  bilaterally. Temperature is within normal limits  bilaterally.  Neurologic  Senn-Weinstein monofilament wire test within normal limits  bilaterally. Muscle power within normal limits bilaterally.  Nails Thick disfigured discolored nails with subungual debris  from hallux to fifth toes bilaterally. No evidence of bacterial infection or drainage bilaterally.  Orthopedic  No limitations of motion  feet .  No crepitus or effusions noted.  No bony pathology or digital deformities noted.  Skin  normotropic skin with no porokeratosis noted bilaterally.  No signs of infections or ulcers noted.     Onychomycosis  Pain in right toes  Pain in left toes  Consent was obtained for treatment procedures.   Mechanical debridement of nails 1-5  bilaterally performed with a nail nipper.  Filed with dremel without incident.    Return office visit    4 months                 Told patient to return for periodic foot care and evaluation due to potential at risk complications.   Jakson Delpilar DPM   diabetes. 

## 2022-08-26 ENCOUNTER — Ambulatory Visit: Payer: Medicare Other | Admitting: Podiatry

## 2022-10-11 ENCOUNTER — Ambulatory Visit (HOSPITAL_COMMUNITY)
Admission: EM | Admit: 2022-10-11 | Discharge: 2022-10-11 | Disposition: A | Discharge status: Home / Self Care | Payer: Medicare Other | Attending: Psychiatry | Admitting: Psychiatry

## 2022-10-11 DIAGNOSIS — Z760 Encounter for issue of repeat prescription: Secondary | ICD-10-CM

## 2022-10-11 MED ORDER — SERTRALINE HCL 50 MG PO TABS
50.0000 mg | ORAL_TABLET | Freq: Every day | ORAL | Status: DC
  Filled 2022-10-11: qty 7

## 2022-10-11 NOTE — ED Provider Notes (Signed)
Behavioral Health Urgent Care Medical Screening Exam  Patient Name: Fred [name] MRN: [id number] Date of Evaluation: 10/11/22 Chief Complaint: "missed phone call" Diagnosis:  Final diagnoses:  Encounter for medication refill   History of Present illness: Fred [name] is a 72 y.o. male. Pt presents voluntarily to [facility] behavioral health for walk-in assessment.  Pt is accompanied by caregiver/power of attorney, Wonda Horner. Pt is assessed face-to-face by nurse practitioner.   Fred [name], 72 y.o., male patient seen face to face by this provider, consulted with Dr. Dwyane Dee; and chart reviewed on 10/11/22.   Per Juleen China, he is pt's caregiver/power of attorney. Pt resides in [place]. Pt's two brothers also reside at [facility]. 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 Pt is followed by Dr. Darleene Cleaver of [place] [facility] for psychiatric medication management. Juleen China states pt missed appointment and when he reached out to [facility], was told that since pt had not been seen in a while, office would be unable to provide a bridge of medication until next appointment on Monday. Pt is prescribed sertraline. Juleen China is unsure of dosage. Juleen China states there have been no behavioral concerns. He does not want pt to be without his medication until the appointment. Juleen China reports pt has good appetite and sleeps well, estimates 10 hours/night. Juleen China denies knowledge of suicide attempts or non suicidal self injurious behavior. Juleen China denies safety concerns with discharge.   Pt speaks in short phrases, responds "yes", "no". He denies suicidal, homicidal or violent ideations. He denies auditory visual hallucinations or paranoia. Pt is calm, cooperative, pleasant. There is no evidence of agitation, aggression, or internal preoccupation.   Juleen China called pt's pharmacy during assessment and confirmed pt is taking sertraline 75m. Discussed with WJuleen Chinaand pt providing  7 day sample of sertraline 517mas medication bridge until appointment on Monday.   Fl[place] CharlesD from 10/11/2022 in [place]      Psychiatric Specialty Exam  Presentation  General Appearance:Appropriate for Environment; Casual; Fairly Groomed  Eye Contact:Minimal  Speech:Other (comment) (speaks in short phrases "yes", "no")  Speech Volume:Normal  Handedness:Right   Mood and Affect  Mood: Euthymic  Affect: Constricted   Thought Process  Thought Processes: Other (comment) (cognitively limited)  Descriptions of Associations:-- (cognitively limited)  Orientation:Other (comment) (cognitively limited)  Thought Content:Other (comment) (cognitively limited)    Hallucinations:None  Ideas of Reference:None  Suicidal Thoughts:No  Homicidal Thoughts:No   Sensorium  Memory: Other (comment) (cognitively limited)  Judgment: Intact  Insight: Other (comment) (cognitively limited)   Executive Functions  Concentration: Other (comment) (cognitively limited)  Attention Span: Other (comment) (cognitively limited)  Recall: Other (comment) (cognitively limited)  Fund of Knowledge: Other (comment) (cognitively limited)  Language: Other (comment) (cognitively limited)   Psychomotor Activity  Psychomotor Activity: Normal   Assets  Assets: Housing; FiCatering managerResilience; Social Support; Transportation   Sleep  Sleep: Good  Number of hours:  0 (Per caregiver/power of attorney, WaJuleen China10 hours/night)   Physical Exam: Physical Exam Constitutional:      General: He is not in acute distress.    Appearance: He is not ill-appearing, toxic-appearing or diaphoretic.  Eyes:     General: No scleral icterus. Cardiovascular:     Rate and Rhythm: Normal rate.  Pulmonary:     Effort: Pulmonary effort is normal. No respiratory distress.  Neurological:     Mental Status:  He is alert.  Psychiatric:  Mood and Affect: Mood normal.        Behavior: Behavior is cooperative.        Thought Content: Thought content normal.    Review of Systems  Constitutional:  Negative for chills and fever.  Respiratory:  Negative for shortness of breath.   Cardiovascular:  Negative for chest pain and palpitations.  Gastrointestinal:  Negative for abdominal pain.  Neurological:  Negative for headaches.  Psychiatric/Behavioral:  Negative for depression, hallucinations, substance abuse and suicidal ideas. The patient is not nervous/anxious and does not have insomnia.    Blood pressure 122/75, pulse 69, resp. rate 18, SpO2 98 %. There is no height or weight on file to calculate BMI.  Musculoskeletal: Strength & Muscle Tone: within normal limits Gait & Station: normal Patient leans: N/A  [place] MSE Discharge Disposition for Follow up and Recommendations: Based on my evaluation the patient does not appear to have an emergency medical condition and can be discharged with resources and follow up care in outpatient services for Medication Management and Individual Therapy  Fred Aquas, NP 10/11/2022, 12:45 PM

## 2022-10-11 NOTE — Progress Notes (Signed)
   10/11/22 0902  [place] Triage Screening (Walk-ins at [facility] only)  How Did You Hear About [place]? Other (Comment)  What Is the Reason for Your Visit/Call Today? Pt has been out of his sertraline medications for the past 4 days. Pt has appointment at [facility] on Monday however caretaker is worried about pt going so long without his medications. Pt denies SI, HI, AVH and substance use.  How Long Has This Been Causing You Problems? <Week  Have You Recently Had Any Thoughts About Hurting Yourself? No  Are You Planning to Commit Suicide/Harm Yourself At This time? No  Have you Recently Had Thoughts About [place]? No  Are You Planning To Harm Someone At This Time? No  Are you currently experiencing any auditory, visual or other hallucinations? No  Have You Used Any Alcohol or Drugs in the Past 24 Hours? No  Do you have any current medical co-morbidities that require immediate attention? No  Clinician description of patient physical appearance/behavior: calm  What Do You Feel Would Help You the Most Today? Medication(s)  If access to [facility] was not available, would you have sought care in the Emergency Department? No  Determination of Need Routine (7 days)  Options For Referral Medication Management

## 2022-10-11 NOTE — Discharge Instructions (Signed)
Patient is instructed prior to discharge to:  Take all medications as prescribed by his/her mental healthcare provider. Report any adverse effects and or reactions from the medicines to his/her outpatient provider promptly. Keep all scheduled appointments, to ensure that you are getting refills on time and to avoid any interruption in your medication.  If you are unable to keep an appointment call to reschedule.  Be sure to follow-up with resources and follow-up appointments provided.  Patient has been instructed & cautioned: To not engage in alcohol and or illegal drug use while on prescription medicines. In the event of worsening symptoms, patient is instructed to call the crisis hotline, 911 and or go to the nearest ED for appropriate evaluation and treatment of symptoms. To follow-up with his/her primary care provider for your other medical issues, concerns and or health care needs.  Information: -National Suicide Prevention Lifeline 1-800-SUICIDE or (250)407-2211.  -988 offers 24/7 access to trained crisis counselors who can help people experiencing mental health-related distress. People can call or text 988 or chat 988lifeline.org for themselves or if they are worried about a loved one who may need crisis support.

## 2022-10-24 ENCOUNTER — Encounter: Payer: Self-pay | Admitting: Podiatry

## 2022-10-24 ENCOUNTER — Ambulatory Visit (INDEPENDENT_AMBULATORY_CARE_PROVIDER_SITE_OTHER): Payer: Medicare Other | Admitting: Podiatry

## 2022-10-24 DIAGNOSIS — M79674 Pain in right toe(s): Secondary | ICD-10-CM

## 2022-10-24 DIAGNOSIS — B351 Tinea unguium: Secondary | ICD-10-CM | POA: Diagnosis not present

## 2022-10-24 DIAGNOSIS — E119 Type 2 diabetes mellitus without complications: Secondary | ICD-10-CM | POA: Diagnosis not present

## 2022-10-24 DIAGNOSIS — M79675 Pain in left toe(s): Secondary | ICD-10-CM | POA: Diagnosis not present

## 2022-10-24 NOTE — Progress Notes (Signed)
This patient returns to my office for at risk foot care.  This patient requires this care by a professional since this patient will be at risk due to having diabetes.  patient is unable to cut nails himself since the patient cannot reach his nails.These nails are painful walking and wearing shoes.  Patient presents to the office with a caregiver. This patient presents for at risk foot care today.  General Appearance  Alert, conversant and in no acute stress.  Vascular  Dorsalis pedis and posterior tibial  pulses are palpable  bilaterally.  Capillary return is within normal limits  bilaterally. Temperature is within normal limits  bilaterally.  Neurologic  Senn-Weinstein monofilament wire test within normal limits  bilaterally. Muscle power within normal limits bilaterally.  Nails Thick disfigured discolored nails with subungual debris  from hallux to fifth toes bilaterally. No evidence of bacterial infection or drainage bilaterally.  Orthopedic  No limitations of motion  feet .  No crepitus or effusions noted.  No bony pathology or digital deformities noted.  Skin  normotropic skin with no porokeratosis noted bilaterally.  No signs of infections or ulcers noted.     Onychomycosis  Pain in right toes  Pain in left toes  Consent was obtained for treatment procedures.   Mechanical debridement of nails 1-5  bilaterally performed with a nail nipper.  Filed with dremel without incident.    Return office visit    4 months                 Told patient to return for periodic foot care and evaluation due to potential at risk complications.   Gardiner Barefoot DPM   diabetes.

## 2022-11-01 ENCOUNTER — Encounter (HOSPITAL_COMMUNITY): Payer: Self-pay

## 2022-11-01 ENCOUNTER — Inpatient Hospital Stay (HOSPITAL_COMMUNITY)
Admission: EM | Admit: 2022-11-01 | Discharge: 2022-11-22 | DRG: 871 | Disposition: A | Discharge status: Home Health | Payer: Medicare Other | Attending: Internal Medicine | Admitting: Internal Medicine

## 2022-11-01 ENCOUNTER — Emergency Department (HOSPITAL_COMMUNITY): Payer: Medicare Other

## 2022-11-01 ENCOUNTER — Other Ambulatory Visit: Payer: Self-pay

## 2022-11-01 DIAGNOSIS — E871 Hypo-osmolality and hyponatremia: Secondary | ICD-10-CM | POA: Diagnosis present

## 2022-11-01 DIAGNOSIS — Z79899 Other long term (current) drug therapy: Secondary | ICD-10-CM

## 2022-11-01 DIAGNOSIS — N4889 Other specified disorders of penis: Secondary | ICD-10-CM | POA: Diagnosis present

## 2022-11-01 DIAGNOSIS — R6520 Severe sepsis without septic shock: Secondary | ICD-10-CM | POA: Diagnosis present

## 2022-11-01 DIAGNOSIS — E876 Hypokalemia: Secondary | ICD-10-CM | POA: Insufficient documentation

## 2022-11-01 DIAGNOSIS — Z1152 Encounter for screening for COVID-19: Secondary | ICD-10-CM

## 2022-11-01 DIAGNOSIS — Z7984 Long term (current) use of oral hypoglycemic drugs: Secondary | ICD-10-CM

## 2022-11-01 DIAGNOSIS — F79 Unspecified intellectual disabilities: Secondary | ICD-10-CM

## 2022-11-01 DIAGNOSIS — N3000 Acute cystitis without hematuria: Secondary | ICD-10-CM | POA: Diagnosis not present

## 2022-11-01 DIAGNOSIS — E1159 Type 2 diabetes mellitus with other circulatory complications: Secondary | ICD-10-CM | POA: Diagnosis present

## 2022-11-01 DIAGNOSIS — R338 Other retention of urine: Secondary | ICD-10-CM | POA: Diagnosis present

## 2022-11-01 DIAGNOSIS — G9341 Metabolic encephalopathy: Secondary | ICD-10-CM

## 2022-11-01 DIAGNOSIS — R310 Gross hematuria: Secondary | ICD-10-CM | POA: Diagnosis present

## 2022-11-01 DIAGNOSIS — I152 Hypertension secondary to endocrine disorders: Secondary | ICD-10-CM | POA: Diagnosis present

## 2022-11-01 DIAGNOSIS — E785 Hyperlipidemia, unspecified: Secondary | ICD-10-CM | POA: Diagnosis present

## 2022-11-01 DIAGNOSIS — N138 Other obstructive and reflux uropathy: Secondary | ICD-10-CM | POA: Diagnosis present

## 2022-11-01 DIAGNOSIS — Z66 Do not resuscitate: Secondary | ICD-10-CM | POA: Insufficient documentation

## 2022-11-01 DIAGNOSIS — E119 Type 2 diabetes mellitus without complications: Secondary | ICD-10-CM

## 2022-11-01 DIAGNOSIS — Z87891 Personal history of nicotine dependence: Secondary | ICD-10-CM

## 2022-11-01 DIAGNOSIS — A419 Sepsis, unspecified organism: Principal | ICD-10-CM | POA: Insufficient documentation

## 2022-11-01 DIAGNOSIS — Z7982 Long term (current) use of aspirin: Secondary | ICD-10-CM

## 2022-11-01 DIAGNOSIS — N179 Acute kidney failure, unspecified: Secondary | ICD-10-CM | POA: Insufficient documentation

## 2022-11-01 DIAGNOSIS — G928 Other toxic encephalopathy: Secondary | ICD-10-CM | POA: Diagnosis present

## 2022-11-01 DIAGNOSIS — N401 Enlarged prostate with lower urinary tract symptoms: Secondary | ICD-10-CM | POA: Diagnosis present

## 2022-11-01 LAB — LACTIC ACID, PLASMA
Lactic Acid, Venous: 1.4 mmol/L (ref 0.5–1.9)
Lactic Acid, Venous: 1.5 mmol/L (ref 0.5–1.9)

## 2022-11-01 LAB — RESP PANEL BY RT-PCR (RSV, FLU A&B, COVID)  RVPGX2
Influenza A by PCR: NEGATIVE
Influenza B by PCR: NEGATIVE
Resp Syncytial Virus by PCR: NEGATIVE
SARS Coronavirus 2 by RT PCR: NEGATIVE

## 2022-11-01 LAB — URINALYSIS, W/ REFLEX TO CULTURE (INFECTION SUSPECTED)
Bilirubin Urine: NEGATIVE
Glucose, UA: NEGATIVE mg/dL
Ketones, ur: NEGATIVE mg/dL
Nitrite: NEGATIVE
Protein, ur: 30 mg/dL — AB
RBC / HPF: >50 RBC/hpf (ref 0–5)
Specific Gravity, Urine: 1.004 — ABNORMAL LOW (ref 1.005–1.030)
pH: 9 — ABNORMAL HIGH (ref 5.0–8.0)

## 2022-11-01 LAB — CBC WITH DIFFERENTIAL/PLATELET
Abs Immature Granulocytes: 0.04 10*3/uL (ref 0.00–0.07)
Basophils Absolute: 0 10*3/uL (ref 0.0–0.1)
Basophils Relative: 0 %
Eosinophils Absolute: 0 10*3/uL (ref 0.0–0.5)
Eosinophils Relative: 0 %
HCT: 34.5 % — ABNORMAL LOW (ref 39.0–52.0)
Hemoglobin: 11.3 g/dL — ABNORMAL LOW (ref 13.0–17.0)
Immature Granulocytes: 0 %
Lymphocytes Relative: 7 %
Lymphs Abs: 1 10*3/uL (ref 0.7–4.0)
MCH: 30.1 pg (ref 26.0–34.0)
MCHC: 32.8 g/dL (ref 30.0–36.0)
MCV: 91.8 fL (ref 80.0–100.0)
Monocytes Absolute: 0.9 10*3/uL (ref 0.1–1.0)
Monocytes Relative: 6 %
Neutro Abs: 12.8 10*3/uL — ABNORMAL HIGH (ref 1.7–7.7)
Neutrophils Relative %: 87 %
Platelets: 120 10*3/uL — ABNORMAL LOW (ref 150–400)
RBC: 3.76 MIL/uL — ABNORMAL LOW (ref 4.22–5.81)
RDW: 13.6 % (ref 11.5–15.5)
WBC: 14.8 10*3/uL — ABNORMAL HIGH (ref 4.0–10.5)
nRBC: 0 % (ref 0.0–0.2)

## 2022-11-01 LAB — COMPREHENSIVE METABOLIC PANEL
ALT: 22 U/L (ref 0–44)
AST: 24 U/L (ref 15–41)
Albumin: 3.2 g/dL — ABNORMAL LOW (ref 3.5–5.0)
Alkaline Phosphatase: 53 U/L (ref 38–126)
Anion gap: 12 (ref 5–15)
BUN: 24 mg/dL — ABNORMAL HIGH (ref 8–23)
CO2: 25 mmol/L (ref 22–32)
Calcium: 9 mg/dL (ref 8.9–10.3)
Chloride: 103 mmol/L (ref 98–111)
Creatinine, Ser: 1.34 mg/dL — ABNORMAL HIGH (ref 0.61–1.24)
GFR, Estimated: 56 mL/min — ABNORMAL LOW (ref >60)
Glucose, Bld: 181 mg/dL — ABNORMAL HIGH (ref 70–99)
Potassium: 3.1 mmol/L — ABNORMAL LOW (ref 3.5–5.1)
Sodium: 140 mmol/L (ref 135–145)
Total Bilirubin: 0.8 mg/dL (ref 0.3–1.2)
Total Protein: 6.5 g/dL (ref 6.5–8.1)

## 2022-11-01 LAB — PROTIME-INR
INR: 1.4 — ABNORMAL HIGH (ref 0.8–1.2)
Prothrombin Time: 17.5 seconds — ABNORMAL HIGH (ref 11.4–15.2)

## 2022-11-01 LAB — APTT: aPTT: 37 seconds — ABNORMAL HIGH (ref 24–36)

## 2022-11-01 MED ORDER — LACTATED RINGERS IV SOLN: INTRAVENOUS | Status: DC

## 2022-11-01 MED ORDER — VANCOMYCIN HCL IN DEXTROSE 1-5 GM/200ML-% IV SOLN
1000.0000 mg | Freq: Once | INTRAVENOUS | Status: AC
  Administered 2022-11-01: 1000 mg via INTRAVENOUS
  Filled 2022-11-01: qty 200

## 2022-11-01 MED ORDER — VANCOMYCIN HCL 750 MG/150ML IV SOLN: 750.0000 mg | INTRAVENOUS | Status: DC

## 2022-11-01 MED ORDER — SODIUM CHLORIDE 0.9 % IV SOLN: 2.0000 g | Freq: Two times a day (BID) | INTRAVENOUS | Status: DC

## 2022-11-01 MED ORDER — POTASSIUM CHLORIDE CRYS ER 20 MEQ PO TBCR: 40.0000 meq | EXTENDED_RELEASE_TABLET | Freq: Once | ORAL | Status: DC

## 2022-11-01 MED ORDER — METRONIDAZOLE 500 MG/100ML IV SOLN
500.0000 mg | Freq: Once | INTRAVENOUS | Status: AC
  Administered 2022-11-01: 500 mg via INTRAVENOUS
  Filled 2022-11-01: qty 100

## 2022-11-01 MED ORDER — SODIUM CHLORIDE 0.9 % IV SOLN
2.0000 g | Freq: Once | INTRAVENOUS | Status: AC
  Administered 2022-11-01: 2 g via INTRAVENOUS
  Filled 2022-11-01: qty 12.5

## 2022-11-01 MED ORDER — SODIUM CHLORIDE 0.9 % IV BOLUS (SEPSIS)
1000.0000 mL | Freq: Once | INTRAVENOUS | Status: AC
  Administered 2022-11-01: 1000 mL via INTRAVENOUS

## 2022-11-01 NOTE — ED Notes (Signed)
Pt return from Ct scanner, NAD noted, no change in status, family at the bedside, pr remains in non-violent restraints.

## 2022-11-01 NOTE — ED Provider Notes (Signed)
[facility] Provider Note   CSN: [id number] Arrival date & time: 11/01/22  1901     History  Chief Complaint  Patient presents with   Unresponsive    Fred [name] is a 72 y.o. male.  HPI   Patient presents to the ED for evaluation of altered mental status.  Patient resides in a group home.  He has history of diabetes, hypertension and MR.  Patient is usually more alert and conversant.  Staff at the facility indicate the patient has been lethargic for the last couple hours.  EMS felt that he was warm to the touch on arrival.  They stated his GCS was 3.  His oxygen saturation was normal however he was given IV fluids and brought to the ED forFurther evaluation.  Patient is slow to respond but does admit to coughing recently.  Denies any pain.  States he is not feeling well  Home Medications Prior to Admission medications   Medication Sig Start Date End Date Taking? Authorizing Provider  alfuzosin (UROXATRAL) 10 MG 24 hr tablet Take 10 mg by mouth daily after breakfast.  03/30/20   [provider]  amLODipine (NORVASC) 10 MG tablet Take 10 mg by mouth daily.  05/22/19   [provider]  aspirin EC 81 MG tablet Take 81 mg by mouth daily.    [provider]  atorvastatin (LIPITOR) 20 MG tablet Take 20 mg by mouth daily.  02/28/18   [provider]  benztropine (COGENTIN) 0.5 MG tablet Take 0.5 mg by mouth daily.     [provider]  carvedilol (COREG) 12.5 MG tablet Take 12.5 mg by mouth 2 (two) times daily with a meal.  06/16/15   [provider]  ferrous sulfate 325 (65 FE) MG tablet Take 325 mg by mouth every Monday, Wednesday, and Friday.  03/27/18   [provider]  glucose monitoring kit (FREESTYLE) monitoring kit Please dispense whichever glucometer is covered by pt's insurance with lancets and strips also; check blood sugar as directed 03/27/17   [provider]  Lancets  Misc. (ACCU-CHEK SOFTCLIX LANCET DEV) KIT by Does not apply route. 11/24/17   [provider]  lisinopril-hydrochlorothiazide (PRINZIDE,ZESTORETIC) 20-25 MG per tablet Take 2 tablets by mouth daily.     [provider]  metFORMIN (GLUCOPHAGE) 500 MG tablet Take 500 mg by mouth 2 (two) times daily.  01/20/15   [provider]  OVER THE COUNTER MEDICATION Take 10 mg by mouth daily. "allergy tab"    [provider]  potassium chloride (K-DUR) 10 MEQ tablet Take 10 mEq by mouth daily.    [provider]  risperiDONE (RISPERDAL) 0.5 MG tablet Take 0.5 mg by mouth daily.    [provider]  risperiDONE (RISPERDAL) 3 MG tablet Take 3 mg by mouth at bedtime.  05/16/19   [provider]  sertraline (ZOLOFT) 50 MG tablet Take 50 mg by mouth daily.    [provider]      Allergies    Patient has no known allergies.    Review of Systems   Review of Systems  Physical Exam Updated Vital Signs BP (!) 154/79   Pulse (!) 122   Temp 98.8 F (37.1 C) (Rectal)   Resp (!) 21   Ht 1.727 m ('5\' 8"'$ )   Wt 60.1 kg   SpO2 95%   BMI 20.15 kg/m  Physical Exam Vitals and nursing note reviewed.  Constitutional:  Appearance: He is well-developed. He is ill-appearing.  HENT:     Head: Normocephalic and atraumatic.     Right Ear: External ear normal.     Left Ear: External ear normal.  Eyes:     General: No scleral icterus.       Right eye: No discharge.        Left eye: No discharge.     Conjunctiva/sclera: Conjunctivae normal.  Neck:     Trachea: No tracheal deviation.  Cardiovascular:     Rate and Rhythm: Normal rate and regular rhythm.  Pulmonary:     Effort: Pulmonary effort is normal. No respiratory distress.     Breath sounds: Normal breath sounds. No stridor. No wheezing or rales.  Chest:     Comments: Frequent coughing Abdominal:     General: Bowel sounds are normal. There is no distension.     Palpations: Abdomen is  soft.     Tenderness: There is no abdominal tenderness. There is no guarding or rebound.  Musculoskeletal:        General: No tenderness or deformity.     Cervical back: Neck supple.  Skin:    General: Skin is warm and dry.     Findings: No rash.  Neurological:     General: No focal deficit present.     Mental Status: He is lethargic.     GCS: GCS eye subscore is 1. GCS verbal subscore is 4. GCS motor subscore is 6.     Cranial Nerves: No cranial nerve deficit, dysarthria or facial asymmetry.     Sensory: No sensory deficit.     Motor: Weakness present. No abnormal muscle tone or seizure activity.     Coordination: Coordination normal.     Comments: Generalized weakness  Psychiatric:        Mood and Affect: Mood normal.     ED Results / Procedures / Treatments   Labs (all labs ordered are listed, but only abnormal results are displayed) Labs Reviewed  COMPREHENSIVE METABOLIC PANEL - Abnormal; Notable for the following components:      Result Value   Potassium 3.1 (*)    Glucose, Bld 181 (*)    BUN 24 (*)    Creatinine, Ser 1.34 (*)    Albumin 3.2 (*)    GFR, Estimated 56 (*)    All other components within normal limits  CBC WITH DIFFERENTIAL/PLATELET - Abnormal; Notable for the following components:   WBC 14.8 (*)    RBC 3.76 (*)    Hemoglobin 11.3 (*)    HCT 34.5 (*)    Platelets 120 (*)    Neutro Abs 12.8 (*)    All other components within normal limits  PROTIME-INR - Abnormal; Notable for the following components:   Prothrombin Time 17.5 (*)    INR 1.4 (*)    All other components within normal limits  APTT - Abnormal; Notable for the following components:   aPTT 37 (*)    All other components within normal limits  URINALYSIS, W/ REFLEX TO CULTURE (INFECTION SUSPECTED) - Abnormal; Notable for the following components:   Color, Urine STRAW (*)    Specific Gravity, Urine 1.004 (*)    pH 9.0 (*)    Hgb urine dipstick LARGE (*)    Protein, ur 30 (*)     Leukocytes,Ua LARGE (*)    Bacteria, UA FEW (*)    All other components within normal limits  RESP PANEL BY RT-PCR (RSV, FLU A&B, COVID)  RVPGX2  CULTURE, BLOOD (ROUTINE X 2)  CULTURE, BLOOD (ROUTINE X 2)  MRSA NEXT GEN BY PCR, NASAL  LACTIC ACID, PLASMA  LACTIC ACID, PLASMA    EKG EKG Interpretation  Date/Time:  Tuesday November 01 2022 19:19:33 EST Ventricular Rate:  99 PR Interval:  196 QRS Duration: 93 QT Interval:  341 QTC Calculation: 438 R Axis:   -10 Text Interpretation: Sinus rhythm Abnormal R-wave progression, early transition Borderline T wave abnormalities Confirmed by Dorie Rank [phone]) on 11/01/2022 9:33:09 PM  Radiology CT Head Wo Contrast  Result Date: 11/01/2022 CLINICAL DATA:  Altered mental status EXAM: CT HEAD WITHOUT CONTRAST TECHNIQUE: Contiguous axial images were obtained from the base of the skull through the vertex without intravenous contrast. RADIATION DOSE REDUCTION: This exam was performed according to the departmental dose-optimization program which includes automated exposure control, adjustment of the mA and/or kV according to patient size and/or use of iterative reconstruction technique. COMPARISON:  04/19/2020 CT head FINDINGS: Brain: No evidence of acute infarction, hemorrhage, mass, mass effect, or midline shift. No hydrocephalus or extra-axial fluid collection. Vascular: No hyperdense vessel. Skull: Negative for fracture or focal lesion. Sinuses/Orbits: Mucous retention cyst in the right maxillary sinus. Mild mucosal thickening in the maxillary sinuses and ethmoid air cells. Dysconjugate gaze. Other: None. IMPRESSION: No acute intracranial process. Electronically Signed   By: Merilyn Baba M.D.   On: 11/01/2022 23:27   DG Chest Port 1 View  Result Date: 11/01/2022 CLINICAL DATA:  Questionable sepsis - evaluate for abnormality EXAM: PORTABLE CHEST 1 VIEW COMPARISON:  Radiograph 02/27/2012 FINDINGS: Chronic cardiomegaly. Unchanged mediastinal contours.  Evidence of acute or focal airspace disease. No pulmonary edema, large pleural effusion or pneumothorax. Stable osseous structures. IMPRESSION: Chronic cardiomegaly.  No acute findings. Electronically Signed   By: Keith Rake M.D.   On: 11/01/2022 19:52    Procedures Procedures    Medications Ordered in ED Medications  lactated ringers infusion ( Intravenous New Bag/Given 11/01/22 2114)  ceFEPIme (MAXIPIME) 2 g in sodium chloride 0.9 % 100 mL IVPB (has no administration in time range)  vancomycin (VANCOREADY) IVPB 750 mg/150 mL (has no administration in time range)  potassium chloride SA (KLOR-CON M) CR tablet 40 mEq (0 mEq Oral Hold 11/01/22 2317)  sodium chloride 0.9 % bolus 1,000 mL (0 mLs Intravenous Stopped 11/01/22 2031)    And  sodium chloride 0.9 % bolus 1,000 mL (0 mLs Intravenous Stopped 11/01/22 2039)  ceFEPIme (MAXIPIME) 2 g in sodium chloride 0.9 % 100 mL IVPB (0 g Intravenous Stopped 11/01/22 2005)  metroNIDAZOLE (FLAGYL) IVPB 500 mg (0 mg Intravenous Stopped 11/01/22 2048)  vancomycin (VANCOCIN) IVPB 1000 mg/200 mL premix (0 mg Intravenous Stopped 11/01/22 2109)    ED Course/ Medical Decision Making/ A&P Clinical Course as of 11/01/22 2348  Tue Nov 01, 2022  2013 CBC with Differential(!) White blood cell count elevated [JK]  2014 Chest x-ray without acute findings. [JK]  2255 Urinalysis does show greater than 50 red blood cells large leukocyte esterase 6-10 white blood cells and few bacteria.  Suggestive of infection. [id number] Creatinine elevated consistent with an AKI [JK]  2348 CT without acute findings [JK]    Clinical Course User Index [JK] Dorie Rank, MD                             Medical Decision Making Differential diagnosis includes but not limited to cerebral hemorrhage, metabolic encephalopathy, sepsis,  Problems Addressed: Acute cystitis without hematuria: acute illness or injury that poses a threat to life or bodily functions  Amount and/or Complexity of  Data Reviewed Labs: ordered. Decision-making details documented in ED Course. Radiology: ordered and independent interpretation performed. ECG/medicine tests: ordered.  Risk Prescription drug management. Decision regarding hospitalization.   Patient presented to ED for evaluation of altered mental status.  Patient noted to be more lethargic than usual.  At baseline has diminished mental capacity.  Patient noted to have low-grade temp in the ED.  He was following my commands and answering questions although he was slow to respond and difficult to understand.  Laboratory test did show evidence of mild hypokalemia and an AKI but no signs of hyponatremia or severe dehydration.  Patient did have leukocytosis but no evidence of lactic acidosis or hypotension to suggest evolving abscess.  Urinalysis does suggest urinary tract infection.  Patient has been started on IV antibiotics.  Case discussed with Dr. Bridgett Larsson regarding admission to the hospital for further treatment.        Final Clinical Impression(s) / ED Diagnoses Final diagnoses:  Acute cystitis without hematuria    Rx / DC Orders ED Discharge Orders     None         Dorie Rank, MD 11/01/22 2348

## 2022-11-01 NOTE — ED Notes (Signed)
Fred [name] [id number] would like an update ASAP

## 2022-11-01 NOTE — ED Notes (Signed)
Pt has repeatedly tried to get up from bed, has removed EKG monitoring devices several times, has removed pulse ox, male purwick, and continues to attempt to remove IV lines. Mickey Farber has been removed as well several times, this RN has been at the bedside to redirect pt and place monitor devices back on pt several times as well as redress pt for dignity, but unable to redirect pt, pt is also non verbal, unsure how much understanding pt has and unsure if he is aware of situation. Verbal order for non-violent soft-wrist restraints by Dr. Tomi Bamberger

## 2022-11-01 NOTE — ED Triage Notes (Signed)
Pt from [facility] for AMS for the past "couple hours". Lethargic, EMS reports GCS 3, hot to touch, unknown temp, VSS per EMS. No recent known infection/UTI per group home. 400 ml bolus NS in route.   Non-verbal at baseline, can answer yes & no

## 2022-11-01 NOTE — Sepsis Progress Note (Signed)
Elink monitoring for the code sepsis protocol.  

## 2022-11-01 NOTE — ED Notes (Signed)
Pt to CT scanner at this time 

## 2022-11-01 NOTE — ED Notes (Signed)
Fred [name] [id number]

## 2022-11-01 NOTE — Progress Notes (Signed)
Pharmacy Antibiotic Note  Fred [name] is a 72 y.o. male admitted on 11/01/2022 with sepsis.  Pharmacy has been consulted for cefepime and vancomycin dosing.  Received vancomycin 1g in ER. WBC 14.8. Afebrile.   3/5 Vancomycin '750mg'$  Q 24 hr Scr used: 1.34 mg/dL Weight: 60.1 kg (TBW< IBW) Vd coeff: 0.72 L/kg Est AUC: 438  Plan: Cefepime 2g q12 hr Vancomycin '750mg'$  q24hr  Monitor cultures, clinical status, renal function, vancomycin level Narrow abx as able and f/u duration   Height: '5\' 8"'$  (172.7 cm) Weight: 60.1 kg (132 lb 7.9 oz) IBW/kg (Calculated) : 68.4  Temp (24hrs), Avg:98.8 F (37.1 C), Min:98.8 F (37.1 C), Max:98.8 F (37.1 C)  No results for input(s): "WBC", "CREATININE", "LATICACIDVEN", "VANCOTROUGH", "VANCOPEAK", "VANCORANDOM", "GENTTROUGH", "GENTPEAK", "GENTRANDOM", "TOBRATROUGH", "TOBRAPEAK", "TOBRARND", "AMIKACINPEAK", "AMIKACINTROU", "AMIKACIN" in the last 168 hours.  CrCl cannot be calculated (Patient's most recent lab result is older than the maximum 21 days allowed.).    No Known Allergies  Antimicrobials this admission: vanc 3/5 >>  cefe 3/5 >>   Dose adjustments this admission: N/a  Microbiology results: 3/5 BCx: pend  3/5 MRSA PCR: pend  Thank you for allowing pharmacy to be a part of this patient's care.  Benetta Spar, PharmD, BCPS, BCCP Clinical Pharmacist  Please check AMION for all [place] phone numbers After 10:00 PM, call [place] [phone]

## 2022-11-02 DIAGNOSIS — E1159 Type 2 diabetes mellitus with other circulatory complications: Secondary | ICD-10-CM | POA: Diagnosis not present

## 2022-11-02 DIAGNOSIS — N401 Enlarged prostate with lower urinary tract symptoms: Secondary | ICD-10-CM | POA: Diagnosis present

## 2022-11-02 DIAGNOSIS — R6520 Severe sepsis without septic shock: Secondary | ICD-10-CM | POA: Insufficient documentation

## 2022-11-02 DIAGNOSIS — N1831 Chronic kidney disease, stage 3a: Secondary | ICD-10-CM | POA: Insufficient documentation

## 2022-11-02 DIAGNOSIS — Z7984 Long term (current) use of oral hypoglycemic drugs: Secondary | ICD-10-CM | POA: Diagnosis not present

## 2022-11-02 DIAGNOSIS — F79 Unspecified intellectual disabilities: Secondary | ICD-10-CM

## 2022-11-02 DIAGNOSIS — N3000 Acute cystitis without hematuria: Secondary | ICD-10-CM

## 2022-11-02 DIAGNOSIS — G928 Other toxic encephalopathy: Secondary | ICD-10-CM | POA: Diagnosis present

## 2022-11-02 DIAGNOSIS — E119 Type 2 diabetes mellitus without complications: Secondary | ICD-10-CM

## 2022-11-02 DIAGNOSIS — R310 Gross hematuria: Secondary | ICD-10-CM | POA: Diagnosis present

## 2022-11-02 DIAGNOSIS — G9341 Metabolic encephalopathy: Secondary | ICD-10-CM

## 2022-11-02 DIAGNOSIS — Z87891 Personal history of nicotine dependence: Secondary | ICD-10-CM | POA: Diagnosis not present

## 2022-11-02 DIAGNOSIS — I152 Hypertension secondary to endocrine disorders: Secondary | ICD-10-CM | POA: Diagnosis present

## 2022-11-02 DIAGNOSIS — A419 Sepsis, unspecified organism: Secondary | ICD-10-CM | POA: Diagnosis present

## 2022-11-02 DIAGNOSIS — N179 Acute kidney failure, unspecified: Secondary | ICD-10-CM

## 2022-11-02 DIAGNOSIS — E876 Hypokalemia: Secondary | ICD-10-CM

## 2022-11-02 DIAGNOSIS — Z1152 Encounter for screening for COVID-19: Secondary | ICD-10-CM | POA: Diagnosis not present

## 2022-11-02 DIAGNOSIS — N138 Other obstructive and reflux uropathy: Secondary | ICD-10-CM

## 2022-11-02 DIAGNOSIS — N4889 Other specified disorders of penis: Secondary | ICD-10-CM | POA: Diagnosis present

## 2022-11-02 DIAGNOSIS — E871 Hypo-osmolality and hyponatremia: Secondary | ICD-10-CM | POA: Diagnosis present

## 2022-11-02 DIAGNOSIS — R338 Other retention of urine: Secondary | ICD-10-CM | POA: Diagnosis present

## 2022-11-02 DIAGNOSIS — E785 Hyperlipidemia, unspecified: Secondary | ICD-10-CM | POA: Diagnosis present

## 2022-11-02 DIAGNOSIS — Z66 Do not resuscitate: Secondary | ICD-10-CM

## 2022-11-02 DIAGNOSIS — Z79899 Other long term (current) drug therapy: Secondary | ICD-10-CM | POA: Diagnosis not present

## 2022-11-02 DIAGNOSIS — Z7982 Long term (current) use of aspirin: Secondary | ICD-10-CM | POA: Diagnosis not present

## 2022-11-02 HISTORY — DX: Sepsis, unspecified organism: R65.20

## 2022-11-02 HISTORY — DX: Sepsis, unspecified organism: A41.9

## 2022-11-02 HISTORY — DX: Acute kidney failure, unspecified: N17.9

## 2022-11-02 LAB — COMPREHENSIVE METABOLIC PANEL
ALT: 31 U/L (ref 0–44)
AST: 41 U/L (ref 15–41)
Albumin: 3 g/dL — ABNORMAL LOW (ref 3.5–5.0)
Alkaline Phosphatase: 56 U/L (ref 38–126)
Anion gap: 10 (ref 5–15)
BUN: 24 mg/dL — ABNORMAL HIGH (ref 8–23)
CO2: 24 mmol/L (ref 22–32)
Calcium: 8.6 mg/dL — ABNORMAL LOW (ref 8.9–10.3)
Chloride: 109 mmol/L (ref 98–111)
Creatinine, Ser: 1.34 mg/dL — ABNORMAL HIGH (ref 0.61–1.24)
GFR, Estimated: 56 mL/min — ABNORMAL LOW (ref >60)
Glucose, Bld: 161 mg/dL — ABNORMAL HIGH (ref 70–99)
Potassium: 2.9 mmol/L — ABNORMAL LOW (ref 3.5–5.1)
Sodium: 143 mmol/L (ref 135–145)
Total Bilirubin: 1.1 mg/dL (ref 0.3–1.2)
Total Protein: 6.4 g/dL — ABNORMAL LOW (ref 6.5–8.1)

## 2022-11-02 LAB — CBG MONITORING, ED: Glucose-Capillary: 139 mg/dL — ABNORMAL HIGH (ref 70–99)

## 2022-11-02 LAB — CBC WITH DIFFERENTIAL/PLATELET
Abs Immature Granulocytes: 0.12 10*3/uL — ABNORMAL HIGH (ref 0.00–0.07)
Basophils Absolute: 0 10*3/uL (ref 0.0–0.1)
Basophils Relative: 0 %
Eosinophils Absolute: 0 10*3/uL (ref 0.0–0.5)
Eosinophils Relative: 0 %
HCT: 36.9 % — ABNORMAL LOW (ref 39.0–52.0)
Hemoglobin: 11.8 g/dL — ABNORMAL LOW (ref 13.0–17.0)
Immature Granulocytes: 1 %
Lymphocytes Relative: 4 %
Lymphs Abs: 0.6 10*3/uL — ABNORMAL LOW (ref 0.7–4.0)
MCH: 30 pg (ref 26.0–34.0)
MCHC: 32 g/dL (ref 30.0–36.0)
MCV: 93.9 fL (ref 80.0–100.0)
Monocytes Absolute: 0.9 10*3/uL (ref 0.1–1.0)
Monocytes Relative: 6 %
Neutro Abs: 14.2 10*3/uL — ABNORMAL HIGH (ref 1.7–7.7)
Neutrophils Relative %: 89 %
Platelets: 134 10*3/uL — ABNORMAL LOW (ref 150–400)
RBC: 3.93 MIL/uL — ABNORMAL LOW (ref 4.22–5.81)
RDW: 13.7 % (ref 11.5–15.5)
WBC: 15.8 10*3/uL — ABNORMAL HIGH (ref 4.0–10.5)
nRBC: 0 % (ref 0.0–0.2)

## 2022-11-02 LAB — GLUCOSE, CAPILLARY
Glucose-Capillary: 156 mg/dL — ABNORMAL HIGH (ref 70–99)
Glucose-Capillary: 197 mg/dL — ABNORMAL HIGH (ref 70–99)

## 2022-11-02 LAB — MAGNESIUM: Magnesium: 1.7 mg/dL (ref 1.7–2.4)

## 2022-11-02 LAB — MRSA NEXT GEN BY PCR, NASAL: MRSA by PCR Next Gen: NOT DETECTED

## 2022-11-02 MED ORDER — AMLODIPINE BESYLATE 10 MG PO TABS
10.0000 mg | ORAL_TABLET | Freq: Every day | ORAL | Status: DC
  Administered 2022-11-02 – 2022-11-22 (×21): 10 mg via ORAL
  Filled 2022-11-02 (×11): qty 1
  Filled 2022-11-02: qty 2
  Filled 2022-11-02 (×11): qty 1

## 2022-11-02 MED ORDER — ACETAMINOPHEN 650 MG RE SUPP
RECTAL | Status: AC
  Filled 2022-11-02: qty 2

## 2022-11-02 MED ORDER — CARVEDILOL 12.5 MG PO TABS
12.5000 mg | ORAL_TABLET | Freq: Two times a day (BID) | ORAL | Status: DC
  Administered 2022-11-02 – 2022-11-22 (×41): 12.5 mg via ORAL
  Filled 2022-11-02 (×41): qty 1

## 2022-11-02 MED ORDER — ALFUZOSIN HCL ER 10 MG PO TB24
10.0000 mg | ORAL_TABLET | Freq: Every day | ORAL | Status: DC
  Administered 2022-11-02 – 2022-11-22 (×21): 10 mg via ORAL
  Filled 2022-11-02 (×21): qty 1

## 2022-11-02 MED ORDER — RISPERIDONE 3 MG PO TABS
3.0000 mg | ORAL_TABLET | Freq: Every day | ORAL | Status: DC
  Administered 2022-11-02 – 2022-11-21 (×20): 3 mg via ORAL
  Filled 2022-11-02 (×21): qty 1

## 2022-11-02 MED ORDER — ATORVASTATIN CALCIUM 10 MG PO TABS
20.0000 mg | ORAL_TABLET | Freq: Every day | ORAL | Status: DC
  Administered 2022-11-02 – 2022-11-22 (×21): 20 mg via ORAL
  Filled 2022-11-02 (×21): qty 2

## 2022-11-02 MED ORDER — BENZTROPINE MESYLATE 0.5 MG PO TABS
0.5000 mg | ORAL_TABLET | Freq: Every day | ORAL | Status: DC
  Administered 2022-11-02 – 2022-11-22 (×21): 0.5 mg via ORAL
  Filled 2022-11-02 (×21): qty 1

## 2022-11-02 MED ORDER — SERTRALINE HCL 50 MG PO TABS
50.0000 mg | ORAL_TABLET | Freq: Every day | ORAL | Status: DC
  Administered 2022-11-02 – 2022-11-22 (×21): 50 mg via ORAL
  Filled 2022-11-02 (×21): qty 1

## 2022-11-02 MED ORDER — ASPIRIN 81 MG PO TBEC
81.0000 mg | DELAYED_RELEASE_TABLET | Freq: Every day | ORAL | Status: DC
  Administered 2022-11-02 – 2022-11-22 (×21): 81 mg via ORAL
  Filled 2022-11-02 (×21): qty 1

## 2022-11-02 MED ORDER — ACETAMINOPHEN 650 MG RE SUPP: 650.0000 mg | Freq: Four times a day (QID) | RECTAL | Status: DC | PRN

## 2022-11-02 MED ORDER — INSULIN ASPART 100 UNIT/ML IJ SOLN
0.0000 [IU] | Freq: Three times a day (TID) | INTRAMUSCULAR | Status: DC
  Administered 2022-11-02: 1 [IU] via SUBCUTANEOUS
  Administered 2022-11-02: 2 [IU] via SUBCUTANEOUS
  Administered 2022-11-03: 5 [IU] via SUBCUTANEOUS
  Administered 2022-11-03: 2 [IU] via SUBCUTANEOUS
  Administered 2022-11-03: 3 [IU] via SUBCUTANEOUS
  Administered 2022-11-04: 2 [IU] via SUBCUTANEOUS
  Administered 2022-11-04: 3 [IU] via SUBCUTANEOUS
  Administered 2022-11-04 – 2022-11-05 (×2): 1 [IU] via SUBCUTANEOUS
  Administered 2022-11-05: 7 [IU] via SUBCUTANEOUS
  Administered 2022-11-06 (×2): 1 [IU] via SUBCUTANEOUS
  Administered 2022-11-07 – 2022-11-08 (×3): 2 [IU] via SUBCUTANEOUS
  Administered 2022-11-09: 3 [IU] via SUBCUTANEOUS
  Administered 2022-11-09 – 2022-11-10 (×2): 2 [IU] via SUBCUTANEOUS
  Administered 2022-11-11: 1 [IU] via SUBCUTANEOUS
  Administered 2022-11-11: 2 [IU] via SUBCUTANEOUS
  Administered 2022-11-12 – 2022-11-13 (×4): 1 [IU] via SUBCUTANEOUS
  Administered 2022-11-13: 2 [IU] via SUBCUTANEOUS
  Administered 2022-11-14: 1 [IU] via SUBCUTANEOUS
  Administered 2022-11-15: 2 [IU] via SUBCUTANEOUS
  Administered 2022-11-16 (×2): 3 [IU] via SUBCUTANEOUS
  Administered 2022-11-17: 1 [IU] via SUBCUTANEOUS
  Administered 2022-11-17 – 2022-11-18 (×3): 2 [IU] via SUBCUTANEOUS
  Administered 2022-11-19: 1 [IU] via SUBCUTANEOUS
  Administered 2022-11-19 – 2022-11-20 (×2): 2 [IU] via SUBCUTANEOUS
  Administered 2022-11-21: 5 [IU] via SUBCUTANEOUS
  Administered 2022-11-21: 2 [IU] via SUBCUTANEOUS
  Administered 2022-11-22: 3 [IU] via SUBCUTANEOUS

## 2022-11-02 MED ORDER — POTASSIUM CHLORIDE 10 MEQ/100ML IV SOLN
10.0000 meq | INTRAVENOUS | Status: AC
  Administered 2022-11-02 (×3): 10 meq via INTRAVENOUS
  Filled 2022-11-02 (×3): qty 100

## 2022-11-02 MED ORDER — ONDANSETRON HCL 4 MG PO TABS: 4.0000 mg | ORAL_TABLET | Freq: Four times a day (QID) | ORAL | Status: DC | PRN

## 2022-11-02 MED ORDER — ACETAMINOPHEN 650 MG RE SUPP
975.0000 mg | Freq: Once | RECTAL | Status: AC
  Administered 2022-11-02: 975 mg via RECTAL

## 2022-11-02 MED ORDER — INSULIN ASPART 100 UNIT/ML IJ SOLN
0.0000 [IU] | Freq: Every day | INTRAMUSCULAR | Status: DC
  Administered 2022-11-03 – 2022-11-07 (×2): 2 [IU] via SUBCUTANEOUS
  Administered 2022-11-08 – 2022-11-09 (×2): 3 [IU] via SUBCUTANEOUS
  Administered 2022-11-10: 2 [IU] via SUBCUTANEOUS
  Administered 2022-11-12 – 2022-11-16 (×2): 3 [IU] via SUBCUTANEOUS
  Administered 2022-11-17: 2 [IU] via SUBCUTANEOUS
  Administered 2022-11-21: 3 [IU] via SUBCUTANEOUS

## 2022-11-02 MED ORDER — SODIUM CHLORIDE 0.9 % IV SOLN
1.0000 g | INTRAVENOUS | Status: DC
  Administered 2022-11-02 – 2022-11-07 (×6): 1 g via INTRAVENOUS
  Filled 2022-11-02 (×6): qty 10

## 2022-11-02 MED ORDER — ONDANSETRON HCL 4 MG/2ML IJ SOLN: 4.0000 mg | Freq: Four times a day (QID) | INTRAMUSCULAR | Status: DC | PRN

## 2022-11-02 MED ORDER — RISPERIDONE 0.5 MG PO TABS
0.5000 mg | ORAL_TABLET | Freq: Every day | ORAL | Status: DC
  Administered 2022-11-02 – 2022-11-22 (×21): 0.5 mg via ORAL
  Filled 2022-11-02 (×21): qty 1

## 2022-11-02 MED ORDER — LACTATED RINGERS IV SOLN: INTRAVENOUS | Status: AC

## 2022-11-02 MED ORDER — ACETAMINOPHEN 325 MG PO TABS
650.0000 mg | ORAL_TABLET | Freq: Four times a day (QID) | ORAL | Status: DC | PRN
  Administered 2022-11-04 – 2022-11-21 (×5): 650 mg via ORAL
  Filled 2022-11-02 (×5): qty 2

## 2022-11-02 MED ORDER — HEPARIN SODIUM (PORCINE) 5000 UNIT/ML IJ SOLN
5000.0000 [IU] | Freq: Three times a day (TID) | INTRAMUSCULAR | Status: DC
  Administered 2022-11-02 – 2022-11-22 (×52): 5000 [IU] via SUBCUTANEOUS
  Filled 2022-11-02 (×53): qty 1

## 2022-11-02 MED ORDER — POTASSIUM CHLORIDE CRYS ER 20 MEQ PO TBCR
40.0000 meq | EXTENDED_RELEASE_TABLET | ORAL | Status: AC
  Administered 2022-11-02 (×2): 40 meq via ORAL
  Filled 2022-11-02 (×2): qty 2

## 2022-11-02 NOTE — Progress Notes (Addendum)
PROGRESS NOTE    Fred [name]  [id number] DOB: 1950-11-16 DOA: 11/01/2022 PCP: [facility]   Brief Narrative:  72 year old African-American male history of intellectual disability since birth, history of hypertension, CKD stage IIIa, type 2 diabetes, BPH with urinary obstruction who presents to the ER today with sudden onset of mental status change.  Patient normally goes to adult daycare.  He lives in a group home with his 2 other intellectually disabled brother.  His sister [name] is his legal guardian.  She states that she was told that the patient suddenly slumped over while he was having dinner.  She was told by the adult daycare that the patient was somewhat slower to answer questions.  He has not had a fever at all today.  There is no vomiting or diarrhea.  He is not prone to UTIs.   On arrival temp 98.8 but increased to 100.1.  Heart rate 99 blood pressure 141/91 satting 97% on room air.   Catheterized UA showed large leukocyte esterase, 6-10 white cells, few bacteria.   Lactic acid was normal at 1.5.   COVID-negative, influenza negative, RSV negative   Sodium 140, potassium 3.1, BUN 24, creatinine 1.3   BUN/creatinine from May 2023 showed a BUN of 18, creatinine of 1.31   Chest x-ray was negative for infiltrates/pneumonia.   CT head demonstrates no acute intracranial abnormality.  He did have some mild mucosal thickening maxillary sinuses and ethmoid air cells.   EKG my interpretation showed normal sinus rhythm.   [facility] hospital[facility] contacted for admission.    ED Course: CT head negative. CXR negative. UA + LE  Assessment & Plan:   Principal Problem:   Acute cystitis without hematuria Active Problems:   Acute metabolic encephalopathy   BPH with urinary obstruction   Hypertension associated with diabetes ([place])   Intellectual disability   Type 2 diabetes mellitus without complication, without long-term current use of insulin  (HCC)   Hypokalemia   DNR (do not resuscitate)/DNI(Do Not Intubate)   Severe sepsis (HCC)   AKI (acute kidney injury) ([place])  Severe sepsis secondary to acute cystitis, POA: Patient meets criteria for severe sepsis based on tachycardia, tachypnea, fever as well as leukocytosis and acute encephalopathy and acute kidney injury.  Continue antibiotics and follow culture.  Acute toxic and metabolic encephalopathy Likely in the setting of UTI.  Treat underlying cause.  He is still confused somewhat but better than yesterday.   Stage 3a chronic kidney disease (CKD) (HCC) - baseline SCr 1.3 rule out.  AKI ruled in. Patient's baseline creatinine actually is between 0.8-1.0 as opposed to Scr 1.3 as mentioned in HPI, that rules out CKD and we will send AKI, currently creatinine 1.34 which has been stable since yesterday.  Continue IV fluids.   Type 2 diabetes mellitus without complication, without long-term current use of insulin (HCC) Hold metformin.  Hemoglobin A1c pending.  Continue SSI.   Intellectual disability On multiple psych meds. 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   Hypertension associated with diabetes ([place]) Slightly elevated diastolic blood pressure, continue home regimen which includes amlodipine and Coreg.   BPH with urinary obstruction Foley catheter placed. Sister says pt sees urology q90 days due to prostate issues.   DNR (do not resuscitate)/DNI(Do Not Intubate) Discussed with pt's sister/HCPOA/legal guardian. She has elected DNR/DNI for patient. Pt will need yellow DNR form completed at discharge.   Hypokalemia Low again.  Will replace.  DVT prophylaxis: heparin injection 5,000 Units Start: 11/02/22 0600 SCDs Start: 11/02/22 0551   Code Status: DNR  Family Communication: Sister present at bedside.  Plan of care discussed with patient in length and he/she verbalized understanding and agreed with it.  Status is:  Observation The patient will require care spanning > 2 midnights and should be moved to inpatient because: Patient is septic and will need 1-2 more days of hospitalization.   Estimated body mass index is 20.15 kg/m as calculated from the following:   Height as of this encounter: '5\' 8"'$  (1.727 m).   Weight as of this encounter: 60.1 kg.    Nutritional Assessment: Body mass index is 20.15 kg/m.Marland Kitchen Seen by dietician.  I agree with the assessment and plan as outlined below: Nutrition Status:        . Skin Assessment: I have examined the patient's skin and I agree with the wound assessment as performed by the wound care RN as outlined below:    Consultants:  None  Procedures:  None  Antimicrobials:  Anti-infectives (From admission, onward)    Start     Dose/Rate Route Frequency Ordered Stop   11/02/22 2000  vancomycin (VANCOREADY) IVPB 750 mg/150 mL  Status:  Discontinued        750 mg 150 mL/hr over 60 Minutes Intravenous Every 24 hours 11/01/22 2050 11/02/22 0058   11/02/22 1000  cefTRIAXone (ROCEPHIN) 1 g in sodium chloride 0.9 % 100 mL IVPB        1 g 200 mL/hr over 30 Minutes Intravenous Every 24 hours 11/02/22 0058     11/02/22 0600  ceFEPIme (MAXIPIME) 2 g in sodium chloride 0.9 % 100 mL IVPB  Status:  Discontinued        2 g 200 mL/hr over 30 Minutes Intravenous Every 12 hours 11/01/22 2050 11/02/22 0058   11/01/22 1915  ceFEPIme (MAXIPIME) 2 g in sodium chloride 0.9 % 100 mL IVPB        2 g 200 mL/hr over 30 Minutes Intravenous  Once 11/01/22 1913 11/01/22 2005   11/01/22 1915  metroNIDAZOLE (FLAGYL) IVPB 500 mg        500 mg 100 mL/hr over 60 Minutes Intravenous  Once 11/01/22 1913 11/01/22 2048   11/01/22 1915  vancomycin (VANCOCIN) IVPB 1000 mg/200 mL premix        1,000 mg 200 mL/hr over 60 Minutes Intravenous  Once 11/01/22 1913 11/01/22 2109         Subjective: Seen and examined in the ED.  Sister at the bedside.  Per sister, patient is typically  nonverbal but he can say yes or no to the questions.  When talking with the patient, patient was able to answer yes or no, per sister, he is better but slightly confused and still lethargic.  Objective: Vitals:   11/02/22 0309 11/02/22 0400 11/02/22 0436 11/02/22 0600  BP:  (!) 155/84  (!) 154/100  Pulse:  92  89  Resp:  16  17  Temp: 100.1 F (37.8 C)  98.9 F (37.2 C)   TempSrc: Rectal  Rectal   SpO2:  96%  97%  Weight:      Height:        Intake/Output Summary (Last 24 hours) at 11/02/2022 0839 Last data filed at 11/02/2022 0408 Gross per 24 hour  Intake 2501.67 ml  Output 1100 ml  Net 1401.67 ml   Filed Weights   11/01/22 1920  Weight: 60.1 kg  Examination:  General exam: Appears calm and comfortable  Respiratory system: Clear to auscultation. Respiratory effort normal. Cardiovascular system: S1 & S2 heard, RRR. No JVD, murmurs, rubs, gallops or clicks. No pedal edema. Gastrointestinal system: Abdomen is nondistended, soft and nontender. No organomegaly or masses felt. Normal bowel sounds heard. Central nervous system: Lethargic and not oriented.  No focal deficit.   Data Reviewed: I have personally reviewed following labs and imaging studies  CBC: Recent Labs  Lab 11/01/22 1913 11/02/22 0620  WBC 14.8* 15.8*  NEUTROABS 12.8* 14.2*  HGB 11.3* 11.8*  HCT 34.5* 36.9*  MCV 91.8 93.9  PLT 120* Q000111Q*   Basic Metabolic Panel: Recent Labs  Lab 11/01/22 1913 11/02/22 0620  NA 140 143  K 3.1* 2.9*  CL 103 109  CO2 25 24  GLUCOSE 181* 161*  BUN 24* 24*  CREATININE 1.34* 1.34*  CALCIUM 9.0 8.6*  MG  --  1.7   GFR: Estimated Creatinine Clearance: 42.4 mL/min (A) (by C-G formula based on SCr of 1.34 mg/dL (H)). Liver Function Tests: Recent Labs  Lab 11/01/22 1913 11/02/22 0620  AST 24 41  ALT 22 31  ALKPHOS 53 56  BILITOT 0.8 1.1  PROT 6.5 6.4*  ALBUMIN 3.2* 3.0*   No results for input(s): "LIPASE", "AMYLASE" in the last 168 hours. No results for  input(s): "AMMONIA" in the last 168 hours. Coagulation Profile: Recent Labs  Lab 11/01/22 1913  INR 1.4*   Cardiac Enzymes: No results for input(s): "CKTOTAL", "CKMB", "CKMBINDEX", "TROPONINI" in the last 168 hours. BNP (last 3 results) No results for input(s): "PROBNP" in the last 8760 hours. HbA1C: No results for input(s): "HGBA1C" in the last 72 hours. CBG: Recent Labs  Lab 11/02/22 0808  GLUCAP 139*   Lipid Profile: No results for input(s): "CHOL", "HDL", "LDLCALC", "TRIG", "CHOLHDL", "LDLDIRECT" in the last 72 hours. Thyroid Function Tests: No results for input(s): "TSH", "T4TOTAL", "FREET4", "T3FREE", "THYROIDAB" in the last 72 hours. Anemia Panel: No results for input(s): "VITAMINB12", "FOLATE", "FERRITIN", "TIBC", "IRON", "RETICCTPCT" in the last 72 hours. Sepsis Labs: Recent Labs  Lab 11/01/22 1913 11/01/22 2146  LATICACIDVEN 1.4 1.5    Recent Results (from the past 240 hour(s))  Blood Culture (routine x 2)     Status: None (Preliminary result)   Collection Time: 11/01/22  7:13 PM   Specimen: BLOOD LEFT WRIST  Result Value Ref Range Status   Specimen Description BLOOD LEFT WRIST  Final   Special Requests   Final    BOTTLES DRAWN AEROBIC AND ANAEROBIC Blood Culture results may not be optimal due to an excessive volume of blood received in culture bottles   Culture   Final    NO GROWTH < 12 HOURS Performed at [facility], [place] [address] [place], [place] [address]    Report Status PENDING  Incomplete  MRSA Next Gen by PCR, Nasal     Status: None   Collection Time: 11/01/22  7:27 PM   Specimen: Nasal Mucosa; Nasal Swab  Result Value Ref Range Status   MRSA by PCR Next Gen NOT DETECTED NOT DETECTED Final    Comment: (NOTE) The GeneXpert MRSA Assay (FDA approved for NASAL specimens only), is one component of a comprehensive MRSA colonization surveillance program. It is not intended to diagnose MRSA infection nor to guide or monitor treatment for MRSA  infections. Test performance is not FDA approved in patients less than 75 years old. Performed at [facility], [place]  [address] [place], [place] [address]   Blood Culture (routine x 2)     Status: None (Preliminary result)   Collection Time: 11/01/22  7:39 PM   Specimen: BLOOD  Result Value Ref Range Status   Specimen Description BLOOD BLOOD RIGHT FOREARM  Final   Special Requests   Final    BOTTLES DRAWN AEROBIC AND ANAEROBIC Blood Culture results may not be optimal due to an excessive volume of blood received in culture bottles   Culture   Final    NO GROWTH < 12 HOURS Performed at [facility], [place] [address], [place], [place] [address]    Report Status PENDING  Incomplete  Resp panel by RT-PCR (RSV, Flu A&B, Covid) Anterior Nasal Swab     Status: None   Collection Time: 11/01/22  8:12 PM   Specimen: Anterior Nasal Swab  Result Value Ref Range Status   SARS Coronavirus 2 by RT PCR NEGATIVE NEGATIVE Final   Influenza A by PCR NEGATIVE NEGATIVE Final   Influenza B by PCR NEGATIVE NEGATIVE Final    Comment: (NOTE) The Xpert Xpress SARS-CoV-2/FLU/RSV plus assay is intended as an aid in the diagnosis of influenza from Nasopharyngeal swab specimens and should not be used as a sole basis for treatment. Nasal washings and aspirates are unacceptable for Xpert Xpress SARS-CoV-2/FLU/RSV testing.  Fact Sheet for Patients: EntrepreneurPulse.com.au  Fact Sheet for Healthcare Providers: IncredibleEmployment.be  This test is not yet approved or cleared by the [place] FDA and has been authorized for detection and/or diagnosis of SARS-CoV-2 by FDA under an Emergency Use Authorization (EUA). This EUA will remain in effect (meaning this test can be used) for the duration of the COVID-19 declaration under Section 564(b)(1) of the Act, 21 U.S.C. section 360bbb-3(b)(1), unless the authorization is terminated or revoked.     Resp Syncytial  Virus by PCR NEGATIVE NEGATIVE Final    Comment: (NOTE) Fact Sheet for Patients: EntrepreneurPulse.com.au  Fact Sheet for Healthcare Providers: IncredibleEmployment.be  This test is not yet approved or cleared by the [place] FDA and has been authorized for detection and/or diagnosis of SARS-CoV-2 by FDA under an Emergency Use Authorization (EUA). This EUA will remain in effect (meaning this test can be used) for the duration of the COVID-19 declaration under Section 564(b)(1) of the Act, 21 U.S.C. section 360bbb-3(b)(1), unless the authorization is terminated or revoked.  Performed at [facility], [place] [address] [place], [place] [address]      Radiology Studies: CT Head Wo Contrast  Result Date: 11/01/2022 CLINICAL DATA:  Altered mental status EXAM: CT HEAD WITHOUT CONTRAST TECHNIQUE: Contiguous axial images were obtained from the base of the skull through the vertex without intravenous contrast. RADIATION DOSE REDUCTION: This exam was performed according to the departmental dose-optimization program which includes automated exposure control, adjustment of the mA and/or kV according to patient size and/or use of iterative reconstruction technique. COMPARISON:  04/19/2020 CT head FINDINGS: Brain: No evidence of acute infarction, hemorrhage, mass, mass effect, or midline shift. No hydrocephalus or extra-axial fluid collection. Vascular: No hyperdense vessel. Skull: Negative for fracture or focal lesion. Sinuses/Orbits: Mucous retention cyst in the right maxillary sinus. Mild mucosal thickening in the maxillary sinuses and ethmoid air cells. Dysconjugate gaze. Other: None. IMPRESSION: No acute intracranial process. Electronically Signed   By: Merilyn Baba M.D.   On: 11/01/2022 23:27   DG Chest Port 1 View  Result Date: 11/01/2022 CLINICAL DATA:  Questionable sepsis - evaluate for abnormality EXAM:  PORTABLE CHEST 1 VIEW COMPARISON:  Radiograph  02/27/2012 FINDINGS: Chronic cardiomegaly. Unchanged mediastinal contours. Evidence of acute or focal airspace disease. No pulmonary edema, large pleural effusion or pneumothorax. Stable osseous structures. IMPRESSION: Chronic cardiomegaly.  No acute findings. Electronically Signed   By: Keith Rake M.D.   On: 11/01/2022 19:52    Scheduled Meds:  acetaminophen       alfuzosin  10 mg Oral QPC breakfast   amLODipine  10 mg Oral Daily   aspirin EC  81 mg Oral Daily   atorvastatin  20 mg Oral Daily   benztropine  0.5 mg Oral Daily   carvedilol  12.5 mg Oral BID WC   heparin  5,000 Units Subcutaneous Q8H   insulin aspart  0-5 Units Subcutaneous QHS   insulin aspart  0-9 Units Subcutaneous TID WC   potassium chloride  40 mEq Oral Q4H   risperiDONE  0.5 mg Oral Daily   risperiDONE  3 mg Oral QHS   sertraline  50 mg Oral Daily   Continuous Infusions:  cefTRIAXone (ROCEPHIN)  IV     lactated ringers 125 mL/hr at 11/02/22 0551  Total time spent: 29 minutes   LOS: 0 days   Darliss Cheney, MD [facility]  11/02/2022, 8:39 AM   *Please note that this is a verbal dictation therefore any spelling or grammatical errors are due to the "[name] [facility]" system interpretation.  Please page via [place] and do not message via secure chat for urgent patient care matters. Secure chat can be used for non urgent patient care matters.  How to contact the [facility] Attending or Consulting provider [place] or covering provider during after hours [place], for this patient?  Check the care team in [facility] and look for a) attending/consulting [facility] provider listed and b) the [facility] team listed. Page or secure chat [phone]. Log into [email or web address] and use [place]'s universal password to access. If you do not have the password, please contact the hospital operator. Locate the [facility] provider you are looking for under [facility] and page to a number that you can be directly reached. If you still have difficulty  reaching the provider, please page the [facility] (Director on Call) for the Hospitalists listed on amion for assistance.

## 2022-11-02 NOTE — ED Notes (Signed)
Pt with male external primafit, changed due to leaking.  Red urine noted in canister and around meatus.  Was incontinent of stool.  Cleaned, linen changed.  Repositioned.  Pt favors R side.  Skin to coccyx.

## 2022-11-02 NOTE — Assessment & Plan Note (Signed)
Pt with baseline cognitive dysfunction. Per pt's sister, pt able to answer yes and no. But not form complex sentences. Unclear how much change from his baseline he is currently exhibiting.

## 2022-11-02 NOTE — ED Notes (Signed)
PURPLE DNR bracelet applied to pt's right wrist

## 2022-11-02 NOTE — Assessment & Plan Note (Signed)
On multiple psych meds. 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

## 2022-11-02 NOTE — Assessment & Plan Note (Signed)
Replete with IV kcl

## 2022-11-02 NOTE — Plan of Care (Signed)
  Problem: Safety: Goal: Non-violent Restraint(s) Outcome: Progressing   Problem: Education: Goal: Ability to describe self-care measures that may prevent or decrease complications (Diabetes Survival Skills Education) will improve Outcome: Progressing Goal: Individualized Educational Video(s) Outcome: Progressing   Problem: Coping: Goal: Ability to adjust to condition or change in health will improve Outcome: Progressing   Problem: Fluid Volume: Goal: Ability to maintain a balanced intake and output will improve Outcome: Progressing   Problem: Health Behavior/Discharge Planning: Goal: Ability to identify and utilize available resources and services will improve Outcome: Progressing Goal: Ability to manage health-related needs will improve Outcome: Progressing   Problem: Metabolic: Goal: Ability to maintain appropriate glucose levels will improve Outcome: Progressing   Problem: Nutritional: Goal: Maintenance of adequate nutrition will improve Outcome: Progressing Goal: Progress toward achieving an optimal weight will improve Outcome: Progressing   Problem: Skin Integrity: Goal: Risk for impaired skin integrity will decrease Outcome: Progressing   Problem: Tissue Perfusion: Goal: Adequacy of tissue perfusion will improve Outcome: Progressing   Problem: Education: Goal: Knowledge of General Education information will improve Description: Including pain rating scale, medication(s)/side effects and non-pharmacologic comfort measures Outcome: Progressing   Problem: Health Behavior/Discharge Planning: Goal: Ability to manage health-related needs will improve Outcome: Progressing   Problem: Clinical Measurements: Goal: Ability to maintain clinical measurements within normal limits will improve Outcome: Progressing Goal: Will remain free from infection Outcome: Progressing Goal: Diagnostic test results will improve Outcome: Progressing Goal: Respiratory complications  will improve Outcome: Progressing Goal: Cardiovascular complication will be avoided Outcome: Progressing   Problem: Activity: Goal: Risk for activity intolerance will decrease Outcome: Progressing   Problem: Nutrition: Goal: Adequate nutrition will be maintained Outcome: Progressing   Problem: Coping: Goal: Level of anxiety will decrease Outcome: Progressing   Problem: Elimination: Goal: Will not experience complications related to bowel motility Outcome: Progressing Goal: Will not experience complications related to urinary retention Outcome: Progressing   Problem: Pain Managment: Goal: General experience of comfort will improve Outcome: Progressing   Problem: Safety: Goal: Ability to remain free from injury will improve Outcome: Progressing   Problem: Skin Integrity: Goal: Risk for impaired skin integrity will decrease Outcome: Progressing   

## 2022-11-02 NOTE — Assessment & Plan Note (Signed)
Stable. 

## 2022-11-02 NOTE — Assessment & Plan Note (Signed)
Discussed with pt's sister/HCPOA/legal guardian. She has elected DNR/DNI for patient. Pt will need yellow DNR form completed at discharge.

## 2022-11-02 NOTE — Assessment & Plan Note (Signed)
Foley catheter placed. Sister says pt sees urology q90 days due to prostate issues.

## 2022-11-02 NOTE — Assessment & Plan Note (Signed)
Hold metformin. Add SSI.

## 2022-11-02 NOTE — H&P (Signed)
History and Physical    Fred [name] [id number] DOB: 01-16-1951 DOA: 11/01/2022  DOS: the patient was seen and examined on 11/01/2022  PCP: [facility]   Patient coming from: [place]  I have personally briefly reviewed patient's old medical records in [place]  CC: AMS HPI: 72 year old African-American male history of intellectual disability since birth, history of hypertension, CKD stage IIIa, type 2 diabetes, BPH with urinary obstruction who presents to the ER today with sudden onset of mental status change.  Patient normally goes to adult daycare.  He lives in a group home with his 2 other intellectually disabled brother.  His sister Fred [name] is his legal guardian.  She states that she was told that the patient suddenly slumped over while he was having dinner.  She was told by the adult daycare that the patient was somewhat slower to answer questions.  He has not had a fever at all today.  There is no vomiting or diarrhea.  He is not prone to UTIs.  On arrival temp 98.8 but increased to 100.1.  Heart rate 99 blood pressure 141/91 satting 97% on room air.  Catheterized UA showed large leukocyte esterase, 6-10 white cells, few bacteria.  Lactic acid was normal at 1.5.  COVID-negative, influenza negative, RSV negative  Sodium 140, potassium 3.1, BUN 24, creatinine 1.3  BUN/creatinine from May 2023 showed a BUN of 18, creatinine of 1.31  Chest x-ray was negative for infiltrates/pneumonia.  CT head demonstrates no acute intracranial abnormality.  He did have some mild mucosal thickening maxillary sinuses and ethmoid air cells.  EKG my interpretation showed normal sinus rhythm.  [facility] hospitalist contacted for admission.   ED Course: CT head negative. CXR negative. UA + LE  Review of Systems:  Review of Systems  Unable to perform ROS: Other  Intellectual disability  Past Medical History:  Diagnosis Date    Diabetes mellitus    Hypertension    Mental retardation     History reviewed. No pertinent surgical history.   reports that he quit smoking about 6 years ago. His smoking use included cigarettes. He has never used smokeless tobacco. He reports that he does not drink alcohol and does not use drugs.  No Known Allergies  Family History  Family history unknown: Yes    Prior to Admission medications   Medication Sig Start Date End Date Taking? Authorizing Provider    Physical Exam: Vitals:   11/01/22 2330 11/02/22 0000 11/02/22 0035 11/02/22 0115  BP: (!) 154/79 (!) 169/75 (!) 176/70   Pulse: (!) 122 (!) 124 (!) 120   Resp: (!) 21 (!) 21 (!) 25   Temp:    (!) 102.2 F (39 C)  TempSrc:    Rectal  SpO2: 95% 94% 97%   Weight:      Height:        Physical Exam Vitals and nursing note reviewed.  Constitutional:      Appearance: He is not toxic-appearing or diaphoretic.  HENT:     Head: Normocephalic and atraumatic.     Nose: Nose normal.  Cardiovascular:     Rate and Rhythm: Regular rhythm. Tachycardia present.  Pulmonary:     Effort: Pulmonary effort is normal. No respiratory distress.     Breath sounds: No wheezing.  Abdominal:     General: Bowel sounds are normal. There is no distension.  Musculoskeletal:     Right lower leg: No edema.  Left lower leg: No edema.  Skin:    General: Skin is warm and dry.     Capillary Refill: Capillary refill takes less than 2 seconds.      Labs on Admission: I have personally reviewed following labs and imaging studies  CBC: Recent Labs  Lab 11/01/22 1913  WBC 14.8*  NEUTROABS 12.8*  HGB 11.3*  HCT 34.5*  MCV 91.8  PLT 123456*   Basic Metabolic Panel: Recent Labs  Lab 11/01/22 1913  NA 140  K 3.1*  CL 103  CO2 25  GLUCOSE 181*  BUN 24*  CREATININE 1.34*  CALCIUM 9.0   GFR: Estimated Creatinine Clearance: 42.4 mL/min (A) (by C-G formula based on SCr of 1.34 mg/dL (H)). Liver Function Tests: Recent Labs  Lab  11/01/22 1913  AST 24  ALT 22  ALKPHOS 53  BILITOT 0.8  PROT 6.5  ALBUMIN 3.2*    Coagulation Profile: Recent Labs  Lab 11/01/22 1913  INR 1.4*   Urine analysis:    Component Value Date/Time   COLORURINE STRAW (A) 11/01/2022 2235   APPEARANCEUR CLEAR 11/01/2022 2235   LABSPEC 1.004 (L) 11/01/2022 2235   PHURINE 9.0 (H) 11/01/2022 2235   GLUCOSEU NEGATIVE 11/01/2022 2235   HGBUR LARGE (A) 11/01/2022 2235   BILIRUBINUR NEGATIVE 11/01/2022 2235   KETONESUR NEGATIVE 11/01/2022 2235   PROTEINUR 30 (A) 11/01/2022 2235   UROBILINOGEN 1.0 02/27/2012 1053   NITRITE NEGATIVE 11/01/2022 2235   LEUKOCYTESUR LARGE (A) 11/01/2022 2235    Radiological Exams on Admission: I have personally reviewed images CT Head Wo Contrast  Result Date: 11/01/2022 CLINICAL DATA:  Altered mental status EXAM: CT HEAD WITHOUT CONTRAST TECHNIQUE: Contiguous axial images were obtained from the base of the skull through the vertex without intravenous contrast. RADIATION DOSE REDUCTION: This exam was performed according to the departmental dose-optimization program which includes automated exposure control, adjustment of the mA and/or kV according to patient size and/or use of iterative reconstruction technique. COMPARISON:  04/19/2020 CT head FINDINGS: Brain: No evidence of acute infarction, hemorrhage, mass, mass effect, or midline shift. No hydrocephalus or extra-axial fluid collection. Vascular: No hyperdense vessel. Skull: Negative for fracture or focal lesion. Sinuses/Orbits: Mucous retention cyst in the right maxillary sinus. Mild mucosal thickening in the maxillary sinuses and ethmoid air cells. Dysconjugate gaze. Other: None. IMPRESSION: No acute intracranial process. Electronically Signed   By: Merilyn Baba M.D.   On: 11/01/2022 23:27   DG Chest Port 1 View  Result Date: 11/01/2022 CLINICAL DATA:  Questionable sepsis - evaluate for abnormality EXAM: PORTABLE CHEST 1 VIEW COMPARISON:  Radiograph 02/27/2012  FINDINGS: Chronic cardiomegaly. Unchanged mediastinal contours. Evidence of acute or focal airspace disease. No pulmonary edema, large pleural effusion or pneumothorax. Stable osseous structures. IMPRESSION: Chronic cardiomegaly.  No acute findings. Electronically Signed   By: Keith Rake M.D.   On: 11/01/2022 19:52    EKG: My personal interpretation of EKG shows: NSR    Assessment/Plan Principal Problem:   Acute cystitis without hematuria Active Problems:   Acute metabolic encephalopathy   BPH with urinary obstruction   Hypertension associated with diabetes ([place])   Intellectual disability   Type 2 diabetes mellitus without complication, without long-term current use of insulin (HCC)   Stage 3a chronic kidney disease (CKD) (HCC) - baseline SCr 1.3   Hypokalemia   DNR (do not resuscitate)/DNI(Do Not Intubate)    Assessment and Plan: * Acute cystitis without hematuria Observation med/tele bed. Continue with IVF. Continue with IV Rocephin.  No need for cefepime and vanco. Do not suspect MDRO.  Acute metabolic encephalopathy Pt with baseline cognitive dysfunction. Per pt's sister, pt able to answer yes and no. But not form complex sentences. Unclear how much change from his baseline he is currently exhibiting.  Stage 3a chronic kidney disease (CKD) (HCC) - baseline SCr 1.3 Baseline Scr 1.3. on HCTZ/ACEI. Not sure if this is the best med for him. Will hold for now. Continue IV hydration and recheck BMP.  Type 2 diabetes mellitus without complication, without long-term current use of insulin (HCC) Hold metformin. Add SSI.  Intellectual disability On multiple psych meds. 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  Hypertension associated with diabetes ([place]) Stable.  BPH with urinary obstruction Foley catheter placed. Sister says pt sees urology q90 days due to prostate issues.  DNR (do not resuscitate)/DNI(Do Not Intubate) Discussed  with pt's sister/HCPOA/legal guardian. She has elected DNR/DNI for patient. Pt will need yellow DNR form completed at discharge.  Hypokalemia Replete with IV kcl   DVT prophylaxis: SQ Heparin Code Status: DNR/DNI(Do NOT Intubate). Verified with pt's sister/POA. Fred [name] Family Communication: discussed at bedside with pt's sister/legal guardian Fred [name]  Disposition Plan: return to group home  Consults called: none  Admission status: Observation, Telemetry bed   Kristopher Oppenheim, DO [facility] 11/02/2022, 1:15 AM

## 2022-11-02 NOTE — Assessment & Plan Note (Signed)
Observation med/tele bed. Continue with IVF. Continue with IV Rocephin. No need for cefepime and vanco. Do not suspect MDRO.

## 2022-11-02 NOTE — Assessment & Plan Note (Signed)
Baseline Scr 1.3. on HCTZ/ACEI. Not sure if this is the best med for him. Will hold for now. Continue IV hydration and recheck BMP.

## 2022-11-02 NOTE — Subjective & Objective (Signed)
CC: AMS HPI: 72 year old African-American male history of intellectual disability since birth, history of hypertension, CKD stage IIIa, type 2 diabetes, BPH with urinary obstruction who presents to the ER today with sudden onset of mental status change.  Patient normally goes to adult daycare.  He lives in a group home with his 2 other intellectually disabled brother.  His sister [name] is his legal guardian.  She states that she was told that the patient suddenly slumped over while he was having dinner.  She was told by the adult daycare that the patient was somewhat slower to answer questions.  He has not had a fever at all today.  There is no vomiting or diarrhea.  He is not prone to UTIs.  On arrival temp 98.8 but increased to 100.1.  Heart rate 99 blood pressure 141/91 satting 97% on room air.  Catheterized UA showed large leukocyte esterase, 6-10 white cells, few bacteria.  Lactic acid was normal at 1.5.  COVID-negative, influenza negative, RSV negative  Sodium 140, potassium 3.1, BUN 24, creatinine 1.3  BUN/creatinine from May 2023 showed a BUN of 18, creatinine of 1.31  Chest x-ray was negative for infiltrates/pneumonia.  CT head demonstrates no acute intracranial abnormality.  He did have some mild mucosal thickening maxillary sinuses and ethmoid air cells.  EKG my interpretation showed normal sinus rhythm.  [facility] hospitalist contacted for admission.

## 2022-11-02 NOTE — ED Notes (Signed)
ED TO INPATIENT HANDOFF REPORT  ED Nurse Name and Phone #: Mahdi Frye   [phone]  S Name/Age/Gender Fred [name] 72 y.o. male Room/Bed: 041C/041C  Code Status   Code Status: DNR  Home/SNF/Other Home Patient oriented to: self and place Is this baseline? Yes   Triage Complete: Triage complete  Chief Complaint Acute cystitis without hematuria [N30.00]  Triage Note Pt from [facility] for AMS for the past "couple hours". Lethargic, EMS reports GCS 3, hot to touch, unknown temp, VSS per EMS. No recent known infection/UTI per group home. 400 ml bolus NS in route.   Non-verbal at baseline, can answer yes & no   Allergies No Known Allergies  Level of Care/Admitting Diagnosis ED Disposition     ED Disposition  Admit   Condition  --   Comment  Hospital Area: [facility] [[id number]]  Level of Care: Telemetry Medical [104]  May place patient in observation at [facility] or [place] if equivalent level of care is available:: No  Covid Evaluation: Asymptomatic - no recent exposure (last 10 days) testing not required  Diagnosis: Acute cystitis without hematuria [id number]  Admitting Physician: Bridgett Larsson [place]  Attending Physician: Bridgett Larsson, [place]          B Medical/Surgery History Past Medical History:  Diagnosis Date   Diabetes mellitus    Hypertension    Mental retardation    History reviewed. No pertinent surgical history.   A IV Location/Drains/Wounds Patient Lines/Drains/Airways Status     Active Line/Drains/Airways     Name Placement date Placement time Site Days   Peripheral IV 11/01/22 18 G Left;Posterior Forearm 11/01/22  1904  Forearm  1   Peripheral IV 11/01/22 18 G Right Antecubital 11/01/22  1915  Antecubital  1            Intake/Output Last 24 hours  Intake/Output Summary (Last 24 hours) at 11/02/2022 1139 Last data filed at 11/02/2022 0408 Gross per 24 hour  Intake 2501.67 ml  Output 1100 ml  Net 1401.67 ml     Labs/Imaging Results for orders placed or performed during the hospital encounter of 11/01/22 (from the past 48 hour(s))  Lactic acid, plasma     Status: None   Collection Time: 11/01/22  7:13 PM  Result Value Ref Range   Lactic Acid, Venous 1.4 0.5 - 1.9 mmol/L    Comment: Performed at [facility], [address], [place], [place] [address]  Comprehensive metabolic panel     Status: Abnormal   Collection Time: 11/01/22  7:13 PM  Result Value Ref Range   Sodium 140 135 - 145 mmol/L   Potassium 3.1 (L) 3.5 - 5.1 mmol/L   Chloride 103 98 - 111 mmol/L   CO2 25 22 - 32 mmol/L   Glucose, Bld 181 (H) 70 - 99 mg/dL    Comment: Glucose reference range applies only to samples taken after fasting for at least 8 hours.   BUN 24 (H) 8 - 23 mg/dL   Creatinine, Ser 1.34 (H) 0.61 - 1.24 mg/dL   Calcium 9.0 8.9 - 10.3 mg/dL   Total Protein 6.5 6.5 - 8.1 g/dL   Albumin 3.2 (L) 3.5 - 5.0 g/dL   AST 24 15 - 41 U/L   ALT 22 0 - 44 U/L   Alkaline Phosphatase 53 38 - 126 U/L   Total Bilirubin 0.8 0.3 - 1.2 mg/dL   GFR, Estimated 56 (L) >60 mL/min  Comment: (NOTE) Calculated using the CKD-EPI Creatinine Equation (2021)    Anion gap 12 5 - 15    Comment: Performed at [facility], [place] [address], [place], [place] [address]  CBC with Differential     Status: Abnormal   Collection Time: 11/01/22  7:13 PM  Result Value Ref Range   WBC 14.8 (H) 4.0 - 10.5 K/uL   RBC 3.76 (L) 4.22 - 5.81 MIL/uL   Hemoglobin 11.3 (L) 13.0 - 17.0 g/dL   HCT 34.5 (L) 39.0 - 52.0 %   MCV 91.8 80.0 - 100.0 fL   MCH 30.1 26.0 - 34.0 pg   MCHC 32.8 30.0 - 36.0 g/dL   RDW 13.6 11.5 - 15.5 %   Platelets 120 (L) 150 - 400 K/uL    Comment: SPECIMEN CHECKED FOR CLOTS REPEATED TO VERIFY    nRBC 0.0 0.0 - 0.2 %   Neutrophils Relative % 87 %   Neutro Abs 12.8 (H) 1.7 - 7.7 K/uL   Lymphocytes Relative 7 %   Lymphs Abs 1.0 0.7 - 4.0 K/uL   Monocytes Relative 6 %   Monocytes Absolute 0.9 0.1 - 1.0 K/uL    Eosinophils Relative 0 %   Eosinophils Absolute 0.0 0.0 - 0.5 K/uL   Basophils Relative 0 %   Basophils Absolute 0.0 0.0 - 0.1 K/uL   Immature Granulocytes 0 %   Abs Immature Granulocytes 0.04 0.00 - 0.07 K/uL    Comment: Performed at [facility], [place] [address], [place], [place] [address]  Protime-INR     Status: Abnormal   Collection Time: 11/01/22  7:13 PM  Result Value Ref Range   Prothrombin Time 17.5 (H) 11.4 - 15.2 seconds   INR 1.4 (H) 0.8 - 1.2    Comment: (NOTE) INR goal varies based on device and disease states. Performed at [facility], [place] [address], [place], [place] [address]   APTT     Status: Abnormal   Collection Time: 11/01/22  7:13 PM  Result Value Ref Range   aPTT 37 (H) 24 - 36 seconds    Comment:        IF BASELINE aPTT IS ELEVATED, SUGGEST PATIENT RISK ASSESSMENT BE USED TO DETERMINE APPROPRIATE ANTICOAGULANT THERAPY. Performed at [facility], [place] [address], [place], [place] [address]   Blood Culture (routine x 2)     Status: None (Preliminary result)   Collection Time: 11/01/22  7:13 PM   Specimen: BLOOD LEFT WRIST  Result Value Ref Range   Specimen Description BLOOD LEFT WRIST    Special Requests      BOTTLES DRAWN AEROBIC AND ANAEROBIC Blood Culture results may not be optimal due to an excessive volume of blood received in culture bottles   Culture      NO GROWTH < 12 HOURS Performed at [facility] [address] [place], [place] [address]    Report Status PENDING   MRSA Next Gen by PCR, Nasal     Status: None   Collection Time: 11/01/22  7:27 PM   Specimen: Nasal Mucosa; Nasal Swab  Result Value Ref Range   MRSA by PCR Next Gen NOT DETECTED NOT DETECTED    Comment: (NOTE) The GeneXpert MRSA Assay (FDA approved for NASAL specimens only), is one component of a comprehensive MRSA colonization surveillance program. It is not intended to diagnose MRSA infection nor to guide or monitor treatment for MRSA  infections. Test performance is  not FDA approved in patients less than 73 years old. Performed at [facility], [place] [address], [place], [place] [address]   Blood Culture (routine x 2)     Status: None (Preliminary result)   Collection Time: 11/01/22  7:39 PM   Specimen: BLOOD  Result Value Ref Range   Specimen Description BLOOD BLOOD RIGHT FOREARM    Special Requests      BOTTLES DRAWN AEROBIC AND ANAEROBIC Blood Culture results may not be optimal due to an excessive volume of blood received in culture bottles   Culture      NO GROWTH < 12 HOURS Performed at [facility] [address], [place], [place] [address]    Report Status PENDING   Resp panel by RT-PCR (RSV, Flu A&B, Covid) Anterior Nasal Swab     Status: None   Collection Time: 11/01/22  8:12 PM   Specimen: Anterior Nasal Swab  Result Value Ref Range   SARS Coronavirus 2 by RT PCR NEGATIVE NEGATIVE   Influenza A by PCR NEGATIVE NEGATIVE   Influenza B by PCR NEGATIVE NEGATIVE    Comment: (NOTE) The Xpert Xpress SARS-CoV-2/FLU/RSV plus assay is intended as an aid in the diagnosis of influenza from Nasopharyngeal swab specimens and should not be used as a sole basis for treatment. Nasal washings and aspirates are unacceptable for Xpert Xpress SARS-CoV-2/FLU/RSV testing.  Fact Sheet for Patients: EntrepreneurPulse.com.au  Fact Sheet for Healthcare Providers: IncredibleEmployment.be  This test is not yet approved or cleared by the [place] FDA and has been authorized for detection and/or diagnosis of SARS-CoV-2 by FDA under an Emergency Use Authorization (EUA). This EUA will remain in effect (meaning this test can be used) for the duration of the COVID-19 declaration under Section 564(b)(1) of the Act, 21 U.S.C. section 360bbb-3(b)(1), unless the authorization is terminated or revoked.     Resp Syncytial Virus by PCR NEGATIVE NEGATIVE    Comment: (NOTE) Fact  Sheet for Patients: EntrepreneurPulse.com.au  Fact Sheet for Healthcare Providers: IncredibleEmployment.be  This test is not yet approved or cleared by the [place] FDA and has been authorized for detection and/or diagnosis of SARS-CoV-2 by FDA under an Emergency Use Authorization (EUA). This EUA will remain in effect (meaning this test can be used) for the duration of the COVID-19 declaration under Section 564(b)(1) of the Act, 21 U.S.C. section 360bbb-3(b)(1), unless the authorization is terminated or revoked.  Performed at [facility], [place] [address], [place], [place] [address]   Lactic acid, plasma     Status: None   Collection Time: 11/01/22  9:46 PM  Result Value Ref Range   Lactic Acid, Venous 1.5 0.5 - 1.9 mmol/L    Comment: Performed at [facility] [address] [place], [place] [address]  Urinalysis, w/ Reflex to Culture (Infection Suspected) -Urine, Clean Catch     Status: Abnormal   Collection Time: 11/01/22 10:35 PM  Result Value Ref Range   Specimen Source URINE, CLEAN CATCH    Color, Urine STRAW (A) YELLOW   APPearance CLEAR CLEAR   Specific Gravity, Urine 1.004 (L) 1.005 - 1.030   pH 9.0 (H) 5.0 - 8.0   Glucose, UA NEGATIVE NEGATIVE mg/dL   Hgb urine dipstick LARGE (A) NEGATIVE   Bilirubin Urine NEGATIVE NEGATIVE   Ketones, ur NEGATIVE NEGATIVE mg/dL   Protein, ur 30 (A) NEGATIVE mg/dL   Nitrite NEGATIVE NEGATIVE   Leukocytes,Ua LARGE (A) NEGATIVE   RBC /  HPF >50 0 - 5 RBC/hpf   WBC, UA 6-10 0 - 5 WBC/hpf    Comment:        Reflex urine culture not performed if WBC <=10, OR if Squamous epithelial cells >5. If Squamous epithelial cells >5 suggest recollection.    Bacteria, UA FEW (A) NONE SEEN   Squamous Epithelial / HPF 0-5 0 - 5 /HPF    Comment: Performed at [facility], [place] [address], [place], [place] [address]  Comprehensive metabolic panel     Status: Abnormal   Collection Time:  11/02/22  6:20 AM  Result Value Ref Range   Sodium 143 135 - 145 mmol/L   Potassium 2.9 (L) 3.5 - 5.1 mmol/L   Chloride 109 98 - 111 mmol/L   CO2 24 22 - 32 mmol/L   Glucose, Bld 161 (H) 70 - 99 mg/dL    Comment: Glucose reference range applies only to samples taken after fasting for at least 8 hours.   BUN 24 (H) 8 - 23 mg/dL   Creatinine, Ser 1.34 (H) 0.61 - 1.24 mg/dL   Calcium 8.6 (L) 8.9 - 10.3 mg/dL   Total Protein 6.4 (L) 6.5 - 8.1 g/dL   Albumin 3.0 (L) 3.5 - 5.0 g/dL   AST 41 15 - 41 U/L   ALT 31 0 - 44 U/L   Alkaline Phosphatase 56 38 - 126 U/L   Total Bilirubin 1.1 0.3 - 1.2 mg/dL   GFR, Estimated 56 (L) >60 mL/min    Comment: (NOTE) Calculated using the CKD-EPI Creatinine Equation (2021)    Anion gap 10 5 - 15    Comment: Performed at [facility], [place] [address], [place], [place] [address]  CBC with Differential/Platelet     Status: Abnormal   Collection Time: 11/02/22  6:20 AM  Result Value Ref Range   WBC 15.8 (H) 4.0 - 10.5 K/uL   RBC 3.93 (L) 4.22 - 5.81 MIL/uL   Hemoglobin 11.8 (L) 13.0 - 17.0 g/dL   HCT 36.9 (L) 39.0 - 52.0 %   MCV 93.9 80.0 - 100.0 fL   MCH 30.0 26.0 - 34.0 pg   MCHC 32.0 30.0 - 36.0 g/dL   RDW 13.7 11.5 - 15.5 %   Platelets 134 (L) 150 - 400 K/uL   nRBC 0.0 0.0 - 0.2 %   Neutrophils Relative % 89 %   Neutro Abs 14.2 (H) 1.7 - 7.7 K/uL   Lymphocytes Relative 4 %   Lymphs Abs 0.6 (L) 0.7 - 4.0 K/uL   Monocytes Relative 6 %   Monocytes Absolute 0.9 0.1 - 1.0 K/uL   Eosinophils Relative 0 %   Eosinophils Absolute 0.0 0.0 - 0.5 K/uL   Basophils Relative 0 %   Basophils Absolute 0.0 0.0 - 0.1 K/uL   Immature Granulocytes 1 %   Abs Immature Granulocytes 0.12 (H) 0.00 - 0.07 K/uL    Comment: Performed at [facility] [address], [place], [place] [address]  Magnesium     Status: None   Collection Time: 11/02/22  6:20 AM  Result Value Ref Range   Magnesium 1.7 1.7 - 2.4 mg/dL    Comment: Performed at [facility] [address] [place], [place] [address]  CBG monitoring, ED     Status: Abnormal   Collection Time: 11/02/22  8:08 AM  Result Value Ref Range   Glucose-Capillary 139 (H) 70 - 99 mg/dL    Comment:  Glucose reference range applies only to samples taken after fasting for at least 8 hours.   CT Head Wo Contrast  Result Date: 11/01/2022 CLINICAL DATA:  Altered mental status EXAM: CT HEAD WITHOUT CONTRAST TECHNIQUE: Contiguous axial images were obtained from the base of the skull through the vertex without intravenous contrast. RADIATION DOSE REDUCTION: This exam was performed according to the departmental dose-optimization program which includes automated exposure control, adjustment of the mA and/or kV according to patient size and/or use of iterative reconstruction technique. COMPARISON:  04/19/2020 CT head FINDINGS: Brain: No evidence of acute infarction, hemorrhage, mass, mass effect, or midline shift. No hydrocephalus or extra-axial fluid collection. Vascular: No hyperdense vessel. Skull: Negative for fracture or focal lesion. Sinuses/Orbits: Mucous retention cyst in the right maxillary sinus. Mild mucosal thickening in the maxillary sinuses and ethmoid air cells. Dysconjugate gaze. Other: None. IMPRESSION: No acute intracranial process. Electronically Signed   By: Merilyn Baba M.D.   On: 11/01/2022 23:27   DG Chest Port 1 View  Result Date: 11/01/2022 CLINICAL DATA:  Questionable sepsis - evaluate for abnormality EXAM: PORTABLE CHEST 1 VIEW COMPARISON:  Radiograph 02/27/2012 FINDINGS: Chronic cardiomegaly. Unchanged mediastinal contours. Evidence of acute or focal airspace disease. No pulmonary edema, large pleural effusion or pneumothorax. Stable osseous structures. IMPRESSION: Chronic cardiomegaly.  No acute findings. Electronically Signed   By: Keith Rake M.D.   On: 11/01/2022 19:52    Pending Labs Unresulted Labs (From admission, onward)     Start     Ordered   11/03/22 0500  CBC with  Differential/Platelet  Tomorrow morning,   R        11/02/22 0946   11/03/22 [id number]  Basic metabolic panel  Tomorrow morning,   R        11/02/22 0946   11/02/22 0551  Hemoglobin A1c  Add-on,   AD       Comments: To assess prior glycemic control    11/02/22 0550   11/02/22 0113  Urine Culture (for pregnant, neutropenic or urologic patients or patients with an indwelling urinary catheter)  (Urine Labs)  Add-on,   AD       Question:  Indication  Answer:  Altered mental status (if no other cause identified)   11/02/22 0112            Vitals/Pain Today's Vitals   11/02/22 0730 11/02/22 0843 11/02/22 0900 11/02/22 1015  BP:  (!) 158/93    Pulse:  93    Resp:  18    Temp:  98.7 F (37.1 C)    TempSrc:  Oral    SpO2:  98%    Weight:      Height:      PainSc: 0-No pain 0-No pain 0-No pain 0-No pain    Isolation Precautions No active isolations  Medications Medications  cefTRIAXone (ROCEPHIN) 1 g in sodium chloride 0.9 % 100 mL IVPB (0 g Intravenous Stopped 11/02/22 1020)  lactated ringers infusion ( Intravenous Rate/Dose Change 11/02/22 0551)  carvedilol (COREG) tablet 12.5 mg (12.5 mg Oral Given 11/02/22 0843)  risperiDONE (RISPERDAL) tablet 0.5 mg (0.5 mg Oral Given 11/02/22 0948)  risperiDONE (RISPERDAL) tablet 3 mg (has no administration in time range)  sertraline (ZOLOFT) tablet 50 mg (50 mg Oral Given 11/02/22 0948)  alfuzosin (UROXATRAL) 24 hr tablet 10 mg (10 mg Oral Given 11/02/22 0843)  benztropine (COGENTIN) tablet 0.5 mg (0.5 mg Oral Given 11/02/22 0948)  amLODipine (NORVASC) tablet 10 mg (10 mg Oral Given 11/02/22  [id number])  aspirin EC tablet 81 mg (81 mg Oral Given 11/02/22 0948)  atorvastatin (LIPITOR) tablet 20 mg (20 mg Oral Given 11/02/22 0948)  insulin aspart (novoLOG) injection 0-9 Units (1 Units Subcutaneous Given 11/02/22 0843)  insulin aspart (novoLOG) injection 0-5 Units (has no administration in time range)  heparin injection 5,000 Units (5,000 Units Subcutaneous Given 11/02/22  [id number])  acetaminophen (TYLENOL) tablet 650 mg (has no administration in time range)    Or  acetaminophen (TYLENOL) suppository 650 mg (has no administration in time range)  ondansetron (ZOFRAN) tablet 4 mg (has no administration in time range)    Or  ondansetron (ZOFRAN) injection 4 mg (has no administration in time range)  acetaminophen (TYLENOL) 650 MG suppository (  Not Given 11/02/22 0127)  potassium chloride SA (KLOR-CON M) CR tablet 40 mEq (40 mEq Oral Given 11/02/22 0947)  sodium chloride 0.9 % bolus 1,000 mL (0 mLs Intravenous Stopped 11/01/22 2031)    And  sodium chloride 0.9 % bolus 1,000 mL (0 mLs Intravenous Stopped 11/01/22 2039)  ceFEPIme (MAXIPIME) 2 g in sodium chloride 0.9 % 100 mL IVPB (0 g Intravenous Stopped 11/01/22 2005)  metroNIDAZOLE (FLAGYL) IVPB 500 mg (0 mg Intravenous Stopped 11/01/22 2048)  vancomycin (VANCOCIN) IVPB 1000 mg/200 mL premix (0 mg Intravenous Stopped 11/01/22 2109)  potassium chloride 10 mEq in 100 mL IVPB (0 mEq Intravenous Stopped 11/02/22 0408)  acetaminophen (TYLENOL) suppository 975 mg (975 mg Rectal Given 11/02/22 0124)    Mobility unknown     Focused Assessments gu   R Recommendations: See Admitting Provider Note  Report given to:   Additional Notes:  repositioning,  skin intact,  favors R side, primafit with red urine.  Sister will be returning to help with meals.

## 2022-11-03 DIAGNOSIS — N3000 Acute cystitis without hematuria: Secondary | ICD-10-CM | POA: Diagnosis not present

## 2022-11-03 LAB — BASIC METABOLIC PANEL
Anion gap: 9 (ref 5–15)
BUN: 36 mg/dL — ABNORMAL HIGH (ref 8–23)
CO2: 22 mmol/L (ref 22–32)
Calcium: 8.7 mg/dL — ABNORMAL LOW (ref 8.9–10.3)
Chloride: 113 mmol/L — ABNORMAL HIGH (ref 98–111)
Creatinine, Ser: 1.98 mg/dL — ABNORMAL HIGH (ref 0.61–1.24)
GFR, Estimated: 35 mL/min — ABNORMAL LOW (ref >60)
Glucose, Bld: 162 mg/dL — ABNORMAL HIGH (ref 70–99)
Potassium: 3.6 mmol/L (ref 3.5–5.1)
Sodium: 144 mmol/L (ref 135–145)

## 2022-11-03 LAB — CBC WITH DIFFERENTIAL/PLATELET
Abs Immature Granulocytes: 0.09 10*3/uL — ABNORMAL HIGH (ref 0.00–0.07)
Basophils Absolute: 0 10*3/uL (ref 0.0–0.1)
Basophils Relative: 0 %
Eosinophils Absolute: 0 10*3/uL (ref 0.0–0.5)
Eosinophils Relative: 0 %
HCT: 33.9 % — ABNORMAL LOW (ref 39.0–52.0)
Hemoglobin: 10.8 g/dL — ABNORMAL LOW (ref 13.0–17.0)
Immature Granulocytes: 1 %
Lymphocytes Relative: 5 %
Lymphs Abs: 0.6 10*3/uL — ABNORMAL LOW (ref 0.7–4.0)
MCH: 29.3 pg (ref 26.0–34.0)
MCHC: 31.9 g/dL (ref 30.0–36.0)
MCV: 91.9 fL (ref 80.0–100.0)
Monocytes Absolute: 0.7 10*3/uL (ref 0.1–1.0)
Monocytes Relative: 6 %
Neutro Abs: 10.7 10*3/uL — ABNORMAL HIGH (ref 1.7–7.7)
Neutrophils Relative %: 88 %
Platelets: 136 10*3/uL — ABNORMAL LOW (ref 150–400)
RBC: 3.69 MIL/uL — ABNORMAL LOW (ref 4.22–5.81)
RDW: 14.1 % (ref 11.5–15.5)
WBC: 12.2 10*3/uL — ABNORMAL HIGH (ref 4.0–10.5)
nRBC: 0 % (ref 0.0–0.2)

## 2022-11-03 LAB — GLUCOSE, CAPILLARY
Glucose-Capillary: 178 mg/dL — ABNORMAL HIGH (ref 70–99)
Glucose-Capillary: 226 mg/dL — ABNORMAL HIGH (ref 70–99)
Glucose-Capillary: 251 mg/dL — ABNORMAL HIGH (ref 70–99)
Glucose-Capillary: 237 mg/dL — ABNORMAL HIGH (ref 70–99)

## 2022-11-03 LAB — HEMOGLOBIN A1C
Hgb A1c MFr Bld: 5.5 % (ref 4.8–5.6)
Mean Plasma Glucose: 111 mg/dL

## 2022-11-03 MED ORDER — SODIUM CHLORIDE 0.9 % IV SOLN: INTRAVENOUS | Status: AC

## 2022-11-03 MED ORDER — TAMSULOSIN HCL 0.4 MG PO CAPS
0.4000 mg | ORAL_CAPSULE | Freq: Every day | ORAL | Status: DC
  Administered 2022-11-03 – 2022-11-08 (×6): 0.4 mg via ORAL
  Filled 2022-11-03 (×7): qty 1

## 2022-11-03 NOTE — TOC Initial Note (Signed)
Transition of Care [facility]) - Initial/Assessment Note    Patient Details  Name: Fred [name] MRN: [id number] Date of Birth: [birth year]  Transition of Care [facility]) CM/SW Contact:    Levonne Lapping, RN Phone Number: 11/03/2022, 3:16 PM  Clinical Narrative:              Transition of Care [facility]) Screening Note   Patient Details  Name: Fred [name] Date of Birth: Feb 28, 1951   Transition of Care [facility]) CM/SW Contact:    Levonne Lapping, RN Phone Number: 11/03/2022, 3:17 PM    Transition of Care Department [facility]) has reviewed patient chart. We will continue to monitor patient advancement through interdisciplinary progression rounds. If new patient transition needs arise, please place a TOC consult.                 Patient Goals and CMS Choice            Expected Discharge Plan and Services                                              Prior Living Arrangements/Services                       Activities of Daily Living Home Assistive Devices/Equipment: None ADL Screening (condition at time of admission) Patient's cognitive ability adequate to safely complete daily activities?: No Is the patient deaf or have difficulty hearing?: No Does the patient have difficulty seeing, even when wearing glasses/contacts?: No Does the patient have difficulty concentrating, remembering, or making decisions?: Yes Patient able to express need for assistance with ADLs?: Yes Does the patient have difficulty dressing or bathing?: Yes Independently performs ADLs?: Yes (appropriate for developmental age) Does the patient have difficulty walking or climbing stairs?: No Weakness of Legs: None Weakness of Arms/Hands: None  Permission Sought/Granted                  Emotional Assessment              Admission diagnosis:  Acute cystitis without hematuria [N30.00] Patient Active Problem List   Diagnosis Date Noted   Acute cystitis without hematuria [id number]    Acute metabolic encephalopathy [id number]   Stage 3a chronic kidney disease (CKD) ([place]) - baseline SCr 1.3 11/02/2022   Hypokalemia 11/02/2022   DNR (do not resuscitate)/DNI(Do Not Intubate) 11/02/2022   Severe sepsis ([place]) 11/02/2022   AKI (acute kidney injury) ([place]) 11/02/2022   Pain due to onychomycosis of toenails of both feet 02/08/2019   Aortic ectasia, abdominal ([place]) 04/23/2018   Anemia 08/06/2017   Elevated prostate specific antigen (PSA) 06/29/2014   Incomplete emptying of bladder 06/29/2014   Urinary incontinence 06/29/2014   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

## 2022-11-03 NOTE — Progress Notes (Signed)
PROGRESS NOTE    Fred [name]  [id number] DOB: 1950-10-15 DOA: 11/01/2022 PCP: [facility]   Brief Narrative:  72 year old African-American male history of intellectual disability since birth, history of hypertension, CKD stage IIIa, type 2 diabetes, BPH with urinary obstruction who presents to the ER today with sudden onset of mental status change.  Patient normally goes to adult daycare.  He lives in a group home with his 2 other intellectually disabled brother.  His sister [name] is his legal guardian.  She states that she was told that the patient suddenly slumped over while he was having dinner.  She was told by the adult daycare that the patient was somewhat slower to answer questions.  He has not had a fever at all today.  There is no vomiting or diarrhea.  He is not prone to UTIs.   On arrival temp 98.8 but increased to 100.1.  Heart rate 99 blood pressure 141/91 satting 97% on room air.   Catheterized UA showed large leukocyte esterase, 6-10 white cells, few bacteria.   Lactic acid was normal at 1.5.   COVID-negative, influenza negative, RSV negative   Sodium 140, potassium 3.1, BUN 24, creatinine 1.3   BUN/creatinine from May 2023 showed a BUN of 18, creatinine of 1.31   Chest x-ray was negative for infiltrates/pneumonia.   CT head demonstrates no acute intracranial abnormality.  He did have some mild mucosal thickening maxillary sinuses and ethmoid air cells.   EKG my interpretation showed normal sinus rhythm.   [facility] hospitalist contacted for admission.    ED Course: CT head negative. CXR negative. UA + LE  Assessment & Plan:   Principal Problem:   Acute cystitis without hematuria Active Problems:   Acute metabolic encephalopathy   BPH with urinary obstruction   Hypertension associated with diabetes ([place])   Intellectual disability   Type 2 diabetes mellitus without complication, without long-term current use of insulin  (HCC)   Hypokalemia   DNR (do not resuscitate)/DNI(Do Not Intubate)   Severe sepsis (HCC)   AKI (acute kidney injury) ([place])  Severe sepsis secondary to acute cystitis, POA: Patient meets criteria for severe sepsis based on tachycardia, tachypnea, fever as well as leukocytosis and acute encephalopathy and acute kidney injury.  Continue antibiotics.  Unfortunately, urine was never sent for culture.  He is afebrile.  Leukocytosis improving.  Acute toxic and metabolic encephalopathy Likely in the setting of UTI.  Treat underlying cause.  He is back at his baseline.   Stage 3a chronic kidney disease (CKD) (HCC) - baseline SCr 1.3 rule out.  AKI ruled in. Patient's baseline creatinine actually is between 0.8-1.0 as opposed to Scr 1.3 as mentioned in HPI, that rules out CKD and we will send AKI, creatinine rising, 1.98 today.  Will resume IV fluids at 125 cc/h.   Type 2 diabetes mellitus without complication, without long-term current use of insulin (HCC) Hold metformin.  Hemoglobin A1c pending.  Continue SSI.   Intellectual disability On multiple psych meds. 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   Hypertension associated with diabetes ([place]) Slightly elevated diastolic blood pressure, continue home regimen which includes amlodipine and Coreg.  Continue to hold lisinopril and hydrochlorothiazide due to rising creatinine.   BPH with urinary obstruction/gross hematuria Foley catheter placed.  Patient has gross hematuria which is improving.  Sister says pt sees urology q90 days due to prostate issues.  Continue home dose  of alfuzosin.   DNR (do not resuscitate)/DNI(Do Not Intubate) Discussed with pt's sister/HCPOA/legal guardian. She has elected DNR/DNI for patient. Pt will need yellow DNR form completed at discharge.   Hypokalemia Resolved.  DVT prophylaxis: heparin injection 5,000 Units Start: 11/02/22 0600 SCDs Start: 11/02/22 0551   Code  Status: DNR  Family Communication: None present at bedside.   Status is: Observation  Status is: Inpatient Remains inpatient appropriate because: Still with hematuria.     Estimated body mass index is 20.15 kg/m as calculated from the following:   Height as of this encounter: '5\' 8"'$  (1.727 m).   Weight as of this encounter: 60.1 kg.    Nutritional Assessment: Body mass index is 20.15 kg/m.Marland Kitchen Seen by dietician.  I agree with the assessment and plan as outlined below: Nutrition Status:        . Skin Assessment: I have examined the patient's skin and I agree with the wound assessment as performed by the wound care RN as outlined below:    Consultants:  None  Procedures:  None  Antimicrobials:  Anti-infectives (From admission, onward)    Start     Dose/Rate Route Frequency Ordered Stop   11/02/22 2000  vancomycin (VANCOREADY) IVPB 750 mg/150 mL  Status:  Discontinued        750 mg 150 mL/hr over 60 Minutes Intravenous Every 24 hours 11/01/22 2050 11/02/22 0058   11/02/22 1000  cefTRIAXone (ROCEPHIN) 1 g in sodium chloride 0.9 % 100 mL IVPB        1 g 200 mL/hr over 30 Minutes Intravenous Every 24 hours 11/02/22 0058     11/02/22 0600  ceFEPIme (MAXIPIME) 2 g in sodium chloride 0.9 % 100 mL IVPB  Status:  Discontinued        2 g 200 mL/hr over 30 Minutes Intravenous Every 12 hours 11/01/22 2050 11/02/22 0058   11/01/22 1915  ceFEPIme (MAXIPIME) 2 g in sodium chloride 0.9 % 100 mL IVPB        2 g 200 mL/hr over 30 Minutes Intravenous  Once 11/01/22 1913 11/01/22 2005   11/01/22 1915  metroNIDAZOLE (FLAGYL) IVPB 500 mg        500 mg 100 mL/hr over 60 Minutes Intravenous  Once 11/01/22 1913 11/01/22 2048   11/01/22 1915  vancomycin (VANCOCIN) IVPB 1000 mg/200 mL premix        1,000 mg 200 mL/hr over 60 Minutes Intravenous  Once 11/01/22 1913 11/01/22 2109         Subjective:  Seen and examined.  Patient nonverbal.  At baseline.  Objective: Vitals:   11/02/22  2000 11/02/22 2339 11/03/22 0324 11/03/22 0800  BP: 139/82 126/83 (!) 145/80 (!) 141/90  Pulse: 80 79 76 88  Resp: '17 15 13   '$ Temp: 98.4 F (36.9 C) 98.6 F (37 C) 98.2 F (36.8 C) 98.6 F (37 C)  TempSrc: Oral Axillary Axillary Oral  SpO2: 94% 94% 94% 91%  Weight:      Height:        Intake/Output Summary (Last 24 hours) at 11/03/2022 1138 Last data filed at 11/03/2022 0916 Gross per 24 hour  Intake 1863.33 ml  Output 150 ml  Net 1713.33 ml    Filed Weights   11/01/22 1920  Weight: 60.1 kg    Examination:  General exam: Appears calm and comfortable, nonverbal at baseline. Respiratory system: Clear to auscultation. Respiratory effort normal. Cardiovascular system: S1 & S2 heard, RRR. No JVD, murmurs, rubs, gallops or  clicks. No pedal edema. Gastrointestinal system: Abdomen is nondistended, soft and nontender. No organomegaly or masses felt. Normal bowel sounds heard. Central nervous system: Alert.  At baseline.  Central nervous system: Lethargic and not oriented.  No focal deficit.   Data Reviewed: I have personally reviewed following labs and imaging studies  CBC: Recent Labs  Lab 11/01/22 1913 11/02/22 0620 11/03/22 0610  WBC 14.8* 15.8* 12.2*  NEUTROABS 12.8* 14.2* 10.7*  HGB 11.3* 11.8* 10.8*  HCT 34.5* 36.9* 33.9*  MCV 91.8 93.9 91.9  PLT 120* 134* 136*    Basic Metabolic Panel: Recent Labs  Lab 11/01/22 1913 11/02/22 0620 11/03/22 0610  NA 140 143 144  K 3.1* 2.9* 3.6  CL 103 109 113*  CO2 '25 24 22  '$ GLUCOSE 181* 161* 162*  BUN 24* 24* 36*  CREATININE 1.34* 1.34* 1.98*  CALCIUM 9.0 8.6* 8.7*  MG  --  1.7  --     GFR: Estimated Creatinine Clearance: 28.7 mL/min (A) (by C-G formula based on SCr of 1.98 mg/dL (H)). Liver Function Tests: Recent Labs  Lab 11/01/22 1913 11/02/22 0620  AST 24 41  ALT 22 31  ALKPHOS 53 56  BILITOT 0.8 1.1  PROT 6.5 6.4*  ALBUMIN 3.2* 3.0*    No results for input(s): "LIPASE", "AMYLASE" in the last 168  hours. No results for input(s): "AMMONIA" in the last 168 hours. Coagulation Profile: Recent Labs  Lab 11/01/22 1913  INR 1.4*    Cardiac Enzymes: No results for input(s): "CKTOTAL", "CKMB", "CKMBINDEX", "TROPONINI" in the last 168 hours. BNP (last 3 results) No results for input(s): "PROBNP" in the last 8760 hours. HbA1C: Recent Labs    11/02/22 0620  HGBA1C 5.5   CBG: Recent Labs  Lab 11/02/22 0808 11/02/22 1246 11/02/22 2217 11/03/22 0819  GLUCAP 139* 156* 197* 178*    Lipid Profile: No results for input(s): "CHOL", "HDL", "LDLCALC", "TRIG", "CHOLHDL", "LDLDIRECT" in the last 72 hours. Thyroid Function Tests: No results for input(s): "TSH", "T4TOTAL", "FREET4", "T3FREE", "THYROIDAB" in the last 72 hours. Anemia Panel: No results for input(s): "VITAMINB12", "FOLATE", "FERRITIN", "TIBC", "IRON", "RETICCTPCT" in the last 72 hours. Sepsis Labs: Recent Labs  Lab 11/01/22 1913 11/01/22 2146  LATICACIDVEN 1.4 1.5     Recent Results (from the past 240 hour(s))  Blood Culture (routine x 2)     Status: None (Preliminary result)   Collection Time: 11/01/22  7:13 PM   Specimen: BLOOD LEFT WRIST  Result Value Ref Range Status   Specimen Description BLOOD LEFT WRIST  Final   Special Requests   Final    BOTTLES DRAWN AEROBIC AND ANAEROBIC Blood Culture results may not be optimal due to an excessive volume of blood received in culture bottles   Culture   Final    NO GROWTH 2 DAYS Performed at [facility], [place] [address] [place], [place] [address]    Report Status PENDING  Incomplete  MRSA Next Gen by PCR, Nasal     Status: None   Collection Time: 11/01/22  7:27 PM   Specimen: Nasal Mucosa; Nasal Swab  Result Value Ref Range Status   MRSA by PCR Next Gen NOT DETECTED NOT DETECTED Final    Comment: (NOTE) The GeneXpert MRSA Assay (FDA approved for NASAL specimens only), is one component of a comprehensive MRSA colonization surveillance program. It is not  intended to diagnose MRSA infection nor to guide or monitor treatment for MRSA infections. Test performance is not FDA approved in  patients less than 26 years old. Performed at [facility], [place] [address], [place], [place] [address]   Blood Culture (routine x 2)     Status: None (Preliminary result)   Collection Time: 11/01/22  7:39 PM   Specimen: BLOOD  Result Value Ref Range Status   Specimen Description BLOOD BLOOD RIGHT FOREARM  Final   Special Requests   Final    BOTTLES DRAWN AEROBIC AND ANAEROBIC Blood Culture results may not be optimal due to an excessive volume of blood received in culture bottles   Culture   Final    NO GROWTH 2 DAYS Performed at [facility], [place] [address] [place], [place] [address]    Report Status PENDING  Incomplete  Resp panel by RT-PCR (RSV, Flu A&B, Covid) Anterior Nasal Swab     Status: None   Collection Time: 11/01/22  8:12 PM   Specimen: Anterior Nasal Swab  Result Value Ref Range Status   SARS Coronavirus 2 by RT PCR NEGATIVE NEGATIVE Final   Influenza A by PCR NEGATIVE NEGATIVE Final   Influenza B by PCR NEGATIVE NEGATIVE Final    Comment: (NOTE) The Xpert Xpress SARS-CoV-2/FLU/RSV plus assay is intended as an aid in the diagnosis of influenza from Nasopharyngeal swab specimens and should not be used as a sole basis for treatment. Nasal washings and aspirates are unacceptable for Xpert Xpress SARS-CoV-2/FLU/RSV testing.  Fact Sheet for Patients: EntrepreneurPulse.com.au  Fact Sheet for Healthcare Providers: IncredibleEmployment.be  This test is not yet approved or cleared by the [place] FDA and has been authorized for detection and/or diagnosis of SARS-CoV-2 by FDA under an Emergency Use Authorization (EUA). This EUA will remain in effect (meaning this test can be used) for the duration of the COVID-19 declaration under Section 564(b)(1) of the Act, 21 U.S.C. section  360bbb-3(b)(1), unless the authorization is terminated or revoked.     Resp Syncytial Virus by PCR NEGATIVE NEGATIVE Final    Comment: (NOTE) Fact Sheet for Patients: EntrepreneurPulse.com.au  Fact Sheet for Healthcare Providers: IncredibleEmployment.be  This test is not yet approved or cleared by the [place] FDA and has been authorized for detection and/or diagnosis of SARS-CoV-2 by FDA under an Emergency Use Authorization (EUA). This EUA will remain in effect (meaning this test can be used) for the duration of the COVID-19 declaration under Section 564(b)(1) of the Act, 21 U.S.C. section 360bbb-3(b)(1), unless the authorization is terminated or revoked.  Performed at [facility], [place] [address], [place], [place] [address]      Radiology Studies: CT Head Wo Contrast  Result Date: 11/01/2022 CLINICAL DATA:  Altered mental status EXAM: CT HEAD WITHOUT CONTRAST TECHNIQUE: Contiguous axial images were obtained from the base of the skull through the vertex without intravenous contrast. RADIATION DOSE REDUCTION: This exam was performed according to the departmental dose-optimization program which includes automated exposure control, adjustment of the mA and/or kV according to patient size and/or use of iterative reconstruction technique. COMPARISON:  04/19/2020 CT head FINDINGS: Brain: No evidence of acute infarction, hemorrhage, mass, mass effect, or midline shift. No hydrocephalus or extra-axial fluid collection. Vascular: No hyperdense vessel. Skull: Negative for fracture or focal lesion. Sinuses/Orbits: Mucous retention cyst in the right maxillary sinus. Mild mucosal thickening in the maxillary sinuses and ethmoid air cells. Dysconjugate gaze. Other: None. IMPRESSION: No acute intracranial process. Electronically Signed   By: Merilyn Baba M.D.   On: 11/01/2022 23:27   DG Chest [facility] [address]  Result Date: 11/01/2022 CLINICAL DATA:   Questionable sepsis - evaluate for abnormality EXAM: PORTABLE CHEST 1 VIEW COMPARISON:  Radiograph 02/27/2012 FINDINGS: Chronic cardiomegaly. Unchanged mediastinal contours. Evidence of acute or focal airspace disease. No pulmonary edema, large pleural effusion or pneumothorax. Stable osseous structures. IMPRESSION: Chronic cardiomegaly.  No acute findings. Electronically Signed   By: Keith Rake M.D.   On: 11/01/2022 19:52    Scheduled Meds:  alfuzosin  10 mg Oral QPC breakfast   amLODipine  10 mg Oral Daily   aspirin EC  81 mg Oral Daily   atorvastatin  20 mg Oral Daily   benztropine  0.5 mg Oral Daily   carvedilol  12.5 mg Oral BID WC   heparin  5,000 Units Subcutaneous Q8H   insulin aspart  0-5 Units Subcutaneous QHS   insulin aspart  0-9 Units Subcutaneous TID WC   risperiDONE  0.5 mg Oral Daily   risperiDONE  3 mg Oral QHS   sertraline  50 mg Oral Daily   Continuous Infusions:  sodium chloride 125 mL/hr at 11/03/22 0915   cefTRIAXone (ROCEPHIN)  IV 1 g (11/03/22 0916)  Total time spent: 29 minutes   LOS: 1 day   Darliss Cheney, MD [facility]  11/03/2022, 11:38 AM   *Please note that this is a verbal dictation therefore any spelling or grammatical errors are due to the "[place] [employer]" system interpretation.  Please page via [place] and do not message via secure chat for urgent patient care matters. Secure chat can be used for non urgent patient care matters.  How to contact the [facility] Attending or Consulting provider [place] or covering provider during after hours [place], for this patient?  Check the care team in [facility] and look for a) attending/consulting [facility] provider listed and b) the [facility] team listed. Page or secure chat [phone]. Log into [email or web address] and use [place]'s universal password to access. If you do not have the password, please contact the hospital operator. Locate the [facility] provider you are looking for under [facility] and page to a number that you  can be directly reached. If you still have difficulty reaching the provider, please page the [facility] (Director on Call) for the Hospitalists listed on amion for assistance.

## 2022-11-04 DIAGNOSIS — N3000 Acute cystitis without hematuria: Secondary | ICD-10-CM | POA: Diagnosis not present

## 2022-11-04 LAB — CBC WITH DIFFERENTIAL/PLATELET
Abs Immature Granulocytes: 0.21 10*3/uL — ABNORMAL HIGH (ref 0.00–0.07)
Basophils Absolute: 0 10*3/uL (ref 0.0–0.1)
Basophils Relative: 0 %
Eosinophils Absolute: 0.1 10*3/uL (ref 0.0–0.5)
Eosinophils Relative: 1 %
HCT: 35.7 % — ABNORMAL LOW (ref 39.0–52.0)
Hemoglobin: 11.6 g/dL — ABNORMAL LOW (ref 13.0–17.0)
Immature Granulocytes: 2 %
Lymphocytes Relative: 7 %
Lymphs Abs: 0.7 10*3/uL (ref 0.7–4.0)
MCH: 29.8 pg (ref 26.0–34.0)
MCHC: 32.5 g/dL (ref 30.0–36.0)
MCV: 91.8 fL (ref 80.0–100.0)
Monocytes Absolute: 0.6 10*3/uL (ref 0.1–1.0)
Monocytes Relative: 6 %
Neutro Abs: 7.9 10*3/uL — ABNORMAL HIGH (ref 1.7–7.7)
Neutrophils Relative %: 84 %
Platelets: 167 10*3/uL (ref 150–400)
RBC: 3.89 MIL/uL — ABNORMAL LOW (ref 4.22–5.81)
RDW: 14.1 % (ref 11.5–15.5)
WBC: 9.5 10*3/uL (ref 4.0–10.5)
nRBC: 0 % (ref 0.0–0.2)

## 2022-11-04 LAB — BASIC METABOLIC PANEL
Anion gap: 8 (ref 5–15)
BUN: 36 mg/dL — ABNORMAL HIGH (ref 8–23)
CO2: 23 mmol/L (ref 22–32)
Calcium: 8.3 mg/dL — ABNORMAL LOW (ref 8.9–10.3)
Chloride: 113 mmol/L — ABNORMAL HIGH (ref 98–111)
Creatinine, Ser: 2.03 mg/dL — ABNORMAL HIGH (ref 0.61–1.24)
GFR, Estimated: 34 mL/min — ABNORMAL LOW (ref >60)
Glucose, Bld: 158 mg/dL — ABNORMAL HIGH (ref 70–99)
Potassium: 3.2 mmol/L — ABNORMAL LOW (ref 3.5–5.1)
Sodium: 144 mmol/L (ref 135–145)

## 2022-11-04 LAB — GLUCOSE, CAPILLARY
Glucose-Capillary: 134 mg/dL — ABNORMAL HIGH (ref 70–99)
Glucose-Capillary: 216 mg/dL — ABNORMAL HIGH (ref 70–99)
Glucose-Capillary: 222 mg/dL — ABNORMAL HIGH (ref 70–99)
Glucose-Capillary: 181 mg/dL — ABNORMAL HIGH (ref 70–99)
Glucose-Capillary: 185 mg/dL — ABNORMAL HIGH (ref 70–99)
Glucose-Capillary: 106 mg/dL — ABNORMAL HIGH (ref 70–99)

## 2022-11-04 MED ORDER — SODIUM CHLORIDE 0.9 % IV SOLN: INTRAVENOUS | Status: AC

## 2022-11-04 MED ORDER — POTASSIUM CHLORIDE CRYS ER 20 MEQ PO TBCR
40.0000 meq | EXTENDED_RELEASE_TABLET | Freq: Once | ORAL | Status: AC
  Administered 2022-11-04: 40 meq via ORAL
  Filled 2022-11-04: qty 2

## 2022-11-04 NOTE — Progress Notes (Signed)
PROGRESS NOTE    Fred [name]  [id number] DOB: [birth year] DOA: 11/01/2022 PCP: [facility]   Brief Narrative:  72 year old African-American male history of intellectual disability since birth, history of hypertension, CKD stage IIIa, type 2 diabetes, BPH with urinary obstruction who presented to the ER with sudden onset of mental status change.  Patient normally goes to adult daycare.  He lives in a group home with his 2 other intellectually disabled brother.  His sister [name] is his legal guardian.  She states that she was told that the patient suddenly slumped over while he was having dinner.  She was told by the adult daycare that the patient was somewhat slower to answer questions.  No reports of fever, chills, nausea or vomiting.   On arrival temp 98.8 but increased to 100.1.  Heart rate 99 blood pressure 141/91 satting 97% on room air.  Catheterized UA showed large leukocyte esterase, 6-10 white cells, few bacteria.  Lactic acid was normal at 1.5.  COVID, influenza and RSV negative.  Patient was admitted with acute metabolic encephalopathy secondary to UTI.  Patient is now having hematuria.  Assessment & Plan:   Principal Problem:   Acute cystitis without hematuria Active Problems:   Acute metabolic encephalopathy   BPH with urinary obstruction   Hypertension associated with diabetes ([place])   Intellectual disability   Type 2 diabetes mellitus without complication, without long-term current use of insulin (HCC)   Hypokalemia   DNR (do not resuscitate)/DNI(Do Not Intubate)   Severe sepsis (HCC)   AKI (acute kidney injury) ([place])  Severe sepsis secondary to acute cystitis, POA: Patient meets criteria for severe sepsis based on tachycardia, tachypnea, fever as well as leukocytosis and acute encephalopathy and acute kidney injury.  Continue antibiotics.  Unfortunately, urine was never sent for culture.  He is afebrile.  Leukocytosis  resolved.  Acute toxic and metabolic encephalopathy Likely in the setting of UTI.  Treat underlying cause.  He is back at his baseline.  Typically he just sometimes nods for yes and no.   Stage 3a chronic kidney disease (CKD) (HCC) - baseline SCr 1.3 rule out.  AKI ruled in. Patient's baseline creatinine actually is between 0.8-1.0 as opposed to Scr 1.3 as mentioned in HPI, that rules out CKD and we will send AKI, creatinine rising, 2.03 today which is very close to what he was yesterday but higher than his baseline.  He received 24 hours of fluid yesterday, I will resume fluids for another 24 hours today and recheck labs in the morning.  Hypokalemia: Will replace.   Type 2 diabetes mellitus without complication, without long-term current use of insulin (HCC) Hold metformin.  Interestingly his hemoglobin A1c is 5.5 but he is intermittently hyperglycemic here.  Continue SSI for now.   Intellectual disability On multiple psych meds. 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   Hypertension associated with diabetes ([place]) Slightly elevated diastolic blood pressure at times, continue home regimen which includes amlodipine and Coreg.  Continue to hold lisinopril and hydrochlorothiazide due to rising creatinine.   BPH with urinary obstruction/gross hematuria He was catheterized in the ED for urine sample.  Likely caused trauma and now patient has gross hematuria which is improving.  Sister says pt sees urology q90 days due to prostate issues.  Continue home dose of alfuzosin.   DNR (do not resuscitate)/DNI(Do Not Intubate) Discussed with pt's sister/HCPOA/legal guardian. She has elected DNR/DNI  for patient. Pt will need yellow DNR form completed at discharge.  DVT prophylaxis: heparin injection 5,000 Units Start: 11/02/22 0600 SCDs Start: 11/02/22 0551   Code Status: DNR  Family Communication: None present at bedside.  Called and discussed with sister  [name]. Status is: Observation  Status is: Inpatient Remains inpatient appropriate because: Still with hematuria.     Estimated body mass index is 20.15 kg/m as calculated from the following:   Height as of this encounter: '5\' 8"'$  (1.727 m).   Weight as of this encounter: 60.1 kg.    Nutritional Assessment: Body mass index is 20.15 kg/m.Marland Kitchen Seen by dietician.  I agree with the assessment and plan as outlined below: Nutrition Status:        . Skin Assessment: I have examined the patient's skin and I agree with the wound assessment as performed by the wound care RN as outlined below:    Consultants:  None  Procedures:  None  Antimicrobials:  Anti-infectives (From admission, onward)    Start     Dose/Rate Route Frequency Ordered Stop   11/02/22 2000  vancomycin (VANCOREADY) IVPB 750 mg/150 mL  Status:  Discontinued        750 mg 150 mL/hr over 60 Minutes Intravenous Every 24 hours 11/01/22 2050 11/02/22 0058   11/02/22 1000  cefTRIAXone (ROCEPHIN) 1 g in sodium chloride 0.9 % 100 mL IVPB        1 g 200 mL/hr over 30 Minutes Intravenous Every 24 hours 11/02/22 0058     11/02/22 0600  ceFEPIme (MAXIPIME) 2 g in sodium chloride 0.9 % 100 mL IVPB  Status:  Discontinued        2 g 200 mL/hr over 30 Minutes Intravenous Every 12 hours 11/01/22 2050 11/02/22 0058   11/01/22 1915  ceFEPIme (MAXIPIME) 2 g in sodium chloride 0.9 % 100 mL IVPB        2 g 200 mL/hr over 30 Minutes Intravenous  Once 11/01/22 1913 11/01/22 2005   11/01/22 1915  metroNIDAZOLE (FLAGYL) IVPB 500 mg        500 mg 100 mL/hr over 60 Minutes Intravenous  Once 11/01/22 1913 11/01/22 2048   11/01/22 1915  vancomycin (VANCOCIN) IVPB 1000 mg/200 mL premix        1,000 mg 200 mL/hr over 60 Minutes Intravenous  Once 11/01/22 1913 11/01/22 2109         Subjective:  Patient seen and examined.  He is nonverbal.  Appears to be at his baseline.  Objective: Vitals:   11/04/22 0033 11/04/22 0443  11/04/22 0800 11/04/22 1118  BP: (!) 164/89 (!) 156/87 (!) 171/99 117/72  Pulse: 93 84 93 92  Resp: '16 16 18 18  '$ Temp: 97.7 F (36.5 C) 97.9 F (36.6 C) 97.8 F (36.6 C) (!) 97.2 F (36.2 C)  TempSrc: Oral Axillary Oral Axillary  SpO2:      Weight:      Height:        Intake/Output Summary (Last 24 hours) at 11/04/2022 1317 Last data filed at 11/04/2022 0900 Gross per 24 hour  Intake 3223.24 ml  Output 400 ml  Net 2823.24 ml    Filed Weights   11/01/22 1920  Weight: 60.1 kg    Examination:  General exam: Appears calm and comfortable  Respiratory system: Clear to auscultation. Respiratory effort normal. Cardiovascular system: S1 & S2 heard, RRR. No JVD, murmurs, rubs, gallops or clicks. No pedal edema. Gastrointestinal system: Abdomen is nondistended, soft and  nontender. No organomegaly or masses felt. Normal bowel sounds heard. Central nervous system: Alert   Central nervous system: Lethargic and not oriented.  No focal deficit.   Data Reviewed: I have personally reviewed following labs and imaging studies  CBC: Recent Labs  Lab 11/01/22 1913 11/02/22 0620 11/03/22 0610 11/04/22 0834  WBC 14.8* 15.8* 12.2* 9.5  NEUTROABS 12.8* 14.2* 10.7* 7.9*  HGB 11.3* 11.8* 10.8* 11.6*  HCT 34.5* 36.9* 33.9* 35.7*  MCV 91.8 93.9 91.9 91.8  PLT 120* 134* 136* [id number]    Basic Metabolic Panel: Recent Labs  Lab 11/01/22 1913 11/02/22 0620 11/03/22 0610 11/04/22 0834  NA 140 143 144 144  K 3.1* 2.9* 3.6 3.2*  CL 103 109 113* 113*  CO2 '25 24 22 23  '$ GLUCOSE 181* 161* 162* 158*  BUN 24* 24* 36* 36*  CREATININE 1.34* 1.34* 1.98* 2.03*  CALCIUM 9.0 8.6* 8.7* 8.3*  MG  --  1.7  --   --     GFR: Estimated Creatinine Clearance: 28 mL/min (A) (by C-G formula based on SCr of 2.03 mg/dL (H)). Liver Function Tests: Recent Labs  Lab 11/01/22 1913 11/02/22 0620  AST 24 41  ALT 22 31  ALKPHOS 53 56  BILITOT 0.8 1.1  PROT 6.5 6.4*  ALBUMIN 3.2* 3.0*    No results for  input(s): "LIPASE", "AMYLASE" in the last 168 hours. No results for input(s): "AMMONIA" in the last 168 hours. Coagulation Profile: Recent Labs  Lab 11/01/22 1913  INR 1.4*    Cardiac Enzymes: No results for input(s): "CKTOTAL", "CKMB", "CKMBINDEX", "TROPONINI" in the last 168 hours. BNP (last 3 results) No results for input(s): "PROBNP" in the last 8760 hours. HbA1C: Recent Labs    11/02/22 0620  HGBA1C 5.5    CBG: Recent Labs  Lab 11/03/22 1206 11/03/22 1642 11/03/22 2213 11/04/22 0757 11/04/22 1225  GLUCAP 226* 251* 237* 134* 222*    Lipid Profile: No results for input(s): "CHOL", "HDL", "LDLCALC", "TRIG", "CHOLHDL", "LDLDIRECT" in the last 72 hours. Thyroid Function Tests: No results for input(s): "TSH", "T4TOTAL", "FREET4", "T3FREE", "THYROIDAB" in the last 72 hours. Anemia Panel: No results for input(s): "VITAMINB12", "FOLATE", "FERRITIN", "TIBC", "IRON", "RETICCTPCT" in the last 72 hours. Sepsis Labs: Recent Labs  Lab 11/01/22 1913 11/01/22 2146  LATICACIDVEN 1.4 1.5     Recent Results (from the past 240 hour(s))  Blood Culture (routine x 2)     Status: None (Preliminary result)   Collection Time: 11/01/22  7:13 PM   Specimen: BLOOD LEFT WRIST  Result Value Ref Range Status   Specimen Description BLOOD LEFT WRIST  Final   Special Requests   Final    BOTTLES DRAWN AEROBIC AND ANAEROBIC Blood Culture results may not be optimal due to an excessive volume of blood received in culture bottles   Culture   Final    NO GROWTH 3 DAYS Performed at [facility], [place] [address], [place], [place] [address]    Report Status PENDING  Incomplete  MRSA Next Gen by PCR, Nasal     Status: None   Collection Time: 11/01/22  7:27 PM   Specimen: Nasal Mucosa; Nasal Swab  Result Value Ref Range Status   MRSA by PCR Next Gen NOT DETECTED NOT DETECTED Final    Comment: (NOTE) The GeneXpert MRSA Assay (FDA approved for NASAL specimens only), is one component of a  comprehensive MRSA colonization surveillance program. It is not intended to diagnose MRSA infection nor to guide or  monitor treatment for MRSA infections. Test performance is not FDA approved in patients less than 62 years old. Performed at [facility], [place] [address] [place], [place] [address]   Blood Culture (routine x 2)     Status: None (Preliminary result)   Collection Time: 11/01/22  7:39 PM   Specimen: BLOOD  Result Value Ref Range Status   Specimen Description BLOOD BLOOD RIGHT FOREARM  Final   Special Requests   Final    BOTTLES DRAWN AEROBIC AND ANAEROBIC Blood Culture results may not be optimal due to an excessive volume of blood received in culture bottles   Culture   Final    NO GROWTH 3 DAYS Performed at [facility], [place] [address] [place], [place] [address]    Report Status PENDING  Incomplete  Resp panel by RT-PCR (RSV, Flu A&B, Covid) Anterior Nasal Swab     Status: None   Collection Time: 11/01/22  8:12 PM   Specimen: Anterior Nasal Swab  Result Value Ref Range Status   SARS Coronavirus 2 by RT PCR NEGATIVE NEGATIVE Final   Influenza A by PCR NEGATIVE NEGATIVE Final   Influenza B by PCR NEGATIVE NEGATIVE Final    Comment: (NOTE) The Xpert Xpress SARS-CoV-2/FLU/RSV plus assay is intended as an aid in the diagnosis of influenza from Nasopharyngeal swab specimens and should not be used as a sole basis for treatment. Nasal washings and aspirates are unacceptable for Xpert Xpress SARS-CoV-2/FLU/RSV testing.  Fact Sheet for Patients: EntrepreneurPulse.com.au  Fact Sheet for Healthcare Providers: IncredibleEmployment.be  This test is not yet approved or cleared by the [place] FDA and has been authorized for detection and/or diagnosis of SARS-CoV-2 by FDA under an Emergency Use Authorization (EUA). This EUA will remain in effect (meaning this test can be used) for the duration of the COVID-19 declaration  under Section 564(b)(1) of the Act, 21 U.S.C. section 360bbb-3(b)(1), unless the authorization is terminated or revoked.     Resp Syncytial Virus by PCR NEGATIVE NEGATIVE Final    Comment: (NOTE) Fact Sheet for Patients: EntrepreneurPulse.com.au  Fact Sheet for Healthcare Providers: IncredibleEmployment.be  This test is not yet approved or cleared by the [place] FDA and has been authorized for detection and/or diagnosis of SARS-CoV-2 by FDA under an Emergency Use Authorization (EUA). This EUA will remain in effect (meaning this test can be used) for the duration of the COVID-19 declaration under Section 564(b)(1) of the Act, 21 U.S.C. section 360bbb-3(b)(1), unless the authorization is terminated or revoked.  Performed at [facility], [place] [address], [place], [place] [address]      Radiology Studies: No results found.  Scheduled Meds:  alfuzosin  10 mg Oral QPC breakfast   amLODipine  10 mg Oral Daily   aspirin EC  81 mg Oral Daily   atorvastatin  20 mg Oral Daily   benztropine  0.5 mg Oral Daily   carvedilol  12.5 mg Oral BID WC   heparin  5,000 Units Subcutaneous Q8H   insulin aspart  0-5 Units Subcutaneous QHS   insulin aspart  0-9 Units Subcutaneous TID WC   risperiDONE  0.5 mg Oral Daily   risperiDONE  3 mg Oral QHS   sertraline  50 mg Oral Daily   tamsulosin  0.4 mg Oral QPC breakfast   Continuous Infusions:  cefTRIAXone (ROCEPHIN)  IV 1 g (11/04/22 0917)  Total time spent: 29 minutes   LOS: 2 days   Darliss Cheney, MD  [facility]  11/04/2022, 1:17 PM   *Please note that this is a verbal dictation therefore any spelling or grammatical errors are due to the "[employer] [employer]" system interpretation.  Please page via [place] and do not message via secure chat for urgent patient care matters. Secure chat can be used for non urgent patient care matters.  How to contact the [facility] Attending or Consulting  provider [place] or covering provider during after hours [place], for this patient?  Check the care team in [facility] and look for a) attending/consulting [facility] provider listed and b) the [facility] team listed. Page or secure chat [phone]. Log into [email or web address] and use [place]'s universal password to access. If you do not have the password, please contact the hospital operator. Locate the [facility] provider you are looking for under [facility] and page to a number that you can be directly reached. If you still have difficulty reaching the provider, please page the [facility] (Director on Call) for the Hospitalists listed on amion for assistance.

## 2022-11-04 NOTE — Care Management Important Message (Signed)
Important Message  Patient Details  Name: Fred [name] MRN: [id number] Date of Birth: 08-09-1951   Medicare Important Message Given:  Yes     [name] 11/04/2022, 12:30 PM

## 2022-11-05 DIAGNOSIS — N3000 Acute cystitis without hematuria: Secondary | ICD-10-CM | POA: Diagnosis not present

## 2022-11-05 LAB — GLUCOSE, CAPILLARY
Glucose-Capillary: 333 mg/dL — ABNORMAL HIGH (ref 70–99)
Glucose-Capillary: 160 mg/dL — ABNORMAL HIGH (ref 70–99)

## 2022-11-05 NOTE — Progress Notes (Signed)
PROGRESS NOTE    Fred [name]  [id number] DOB: 07-08-1951 DOA: 11/01/2022 PCP: [facility]   Brief Narrative:  72 year old African-American male history of intellectual disability since birth, history of hypertension, CKD stage IIIa, type 2 diabetes, BPH with urinary obstruction who presented to the ER with sudden onset of mental status change.  Patient normally goes to adult daycare.  He lives in a group home with his 2 other intellectually disabled brother.  His sister [name] is his legal guardian.  She states that she was told that the patient suddenly slumped over while he was having dinner.  She was told by the adult daycare that the patient was somewhat slower to answer questions.  No reports of fever, chills, nausea or vomiting.   On arrival temp 98.8 but increased to 100.1.  Heart rate 99 blood pressure 141/91 satting 97% on room air.  Catheterized UA showed large leukocyte esterase, 6-10 white cells, few bacteria.  Lactic acid was normal at 1.5.  COVID, influenza and RSV negative.  Patient was admitted with acute metabolic encephalopathy secondary to UTI.  Patient is now having hematuria.  Assessment & Plan:   Principal Problem:   Acute cystitis without hematuria Active Problems:   BPH with urinary obstruction   Hypertension associated with diabetes ([place])   Intellectual disability   Type 2 diabetes mellitus without complication, without long-term current use of insulin (HCC)   Acute metabolic encephalopathy   Hypokalemia   DNR (do not resuscitate)/DNI(Do Not Intubate)   Severe sepsis (HCC)   AKI (acute kidney injury) ([place])  Severe sepsis secondary to acute cystitis, POA: Patient meets criteria for severe sepsis based on tachycardia, tachypnea, fever as well as leukocytosis and acute encephalopathy and acute kidney injury.  Continue antibiotics.  Unfortunately, urine was never sent for culture.  He is afebrile.  Leukocytosis  resolved.  Acute toxic and metabolic encephalopathy Likely in the setting of UTI.  Treat underlying cause.  He is back at his baseline.  Typically he just sometimes nods for yes and no.   Stage 3a chronic kidney disease (CKD) (HCC) - baseline SCr 1.3 rule out.  AKI ruled in. Patient's baseline creatinine actually is between 0.8-1.0 as opposed to Scr 1.3 as mentioned in HPI, that rules out CKD and we will send AKI, creatinine rising, 2.03 today which is very close to what he was yesterday but higher than his baseline.  He received 24 hours of fluid yesterday, I will resume fluids for another 24 hours today and recheck labs in the morning.  Hypokalemia: Will replace.   Type 2 diabetes mellitus without complication, without long-term current use of insulin (HCC) Hold metformin.  Interestingly his hemoglobin A1c is 5.5 but he is intermittently hyperglycemic here.  Continue SSI for now.   Intellectual disability On multiple psych meds. 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   Hypertension associated with diabetes ([place]) Slightly elevated diastolic blood pressure at times, continue home regimen which includes amlodipine and Coreg.  Continue to hold lisinopril and hydrochlorothiazide due to rising creatinine.   BPH with urinary obstruction/gross hematuria He was catheterized in the ED for urine sample.  Likely caused trauma and now patient has gross hematuria which is improving.  Sister says pt sees urology q90 days due to prostate issues.  Continue home dose of alfuzosin.   DNR (do not resuscitate)/DNI(Do Not Intubate) Discussed with pt's sister/HCPOA/legal guardian. She has elected DNR/DNI  for patient. Pt will need yellow DNR form completed at discharge.  DVT prophylaxis: heparin injection 5,000 Units Start: 11/02/22 0600 SCDs Start: 11/02/22 0551   Code Status: DNR  Family Communication: None present at bedside.  Called and discussed with sister  [name]. Status is: Observation  Status is: Inpatient Remains inpatient appropriate because: Still with hematuria.     Estimated body mass index is 21.52 kg/m as calculated from the following:   Height as of this encounter: '5\' 8"'$  (1.727 m).   Weight as of this encounter: 64.2 kg.    Nutritional Assessment: Body mass index is 21.52 kg/m.Marland Kitchen Seen by dietician.  I agree with the assessment and plan as outlined below: Nutrition Status:        . Skin Assessment: I have examined the patient's skin and I agree with the wound assessment as performed by the wound care RN as outlined below:    Consultants:  None  Procedures:  None  Antimicrobials:  Anti-infectives (From admission, onward)    Start     Dose/Rate Route Frequency Ordered Stop   11/02/22 2000  vancomycin (VANCOREADY) IVPB 750 mg/150 mL  Status:  Discontinued        750 mg 150 mL/hr over 60 Minutes Intravenous Every 24 hours 11/01/22 2050 11/02/22 0058   11/02/22 1000  cefTRIAXone (ROCEPHIN) 1 g in sodium chloride 0.9 % 100 mL IVPB        1 g 200 mL/hr over 30 Minutes Intravenous Every 24 hours 11/02/22 0058     11/02/22 0600  ceFEPIme (MAXIPIME) 2 g in sodium chloride 0.9 % 100 mL IVPB  Status:  Discontinued        2 g 200 mL/hr over 30 Minutes Intravenous Every 12 hours 11/01/22 2050 11/02/22 0058   11/01/22 1915  ceFEPIme (MAXIPIME) 2 g in sodium chloride 0.9 % 100 mL IVPB        2 g 200 mL/hr over 30 Minutes Intravenous  Once 11/01/22 1913 11/01/22 2005   11/01/22 1915  metroNIDAZOLE (FLAGYL) IVPB 500 mg        500 mg 100 mL/hr over 60 Minutes Intravenous  Once 11/01/22 1913 11/01/22 2048   11/01/22 1915  vancomycin (VANCOCIN) IVPB 1000 mg/200 mL premix        1,000 mg 200 mL/hr over 60 Minutes Intravenous  Once 11/01/22 1913 11/01/22 2109         Subjective:  Patient seen and examined.  He is nonverbal.  Appears to be at his baseline.  Objective: Vitals:   11/05/22 0309 11/05/22 0500  11/05/22 0726 11/05/22 1138  BP: (!) 156/87  (!) 160/91   Pulse: 95  97   Resp: '18  20 18  '$ Temp: 97.9 F (36.6 C)  97.8 F (36.6 C) 98 F (36.7 C)  TempSrc: Axillary  Axillary Axillary  SpO2: 95%  97% 95%  Weight:  64.2 kg    Height:        Intake/Output Summary (Last 24 hours) at 11/05/2022 1145 Last data filed at 11/05/2022 0944 Gross per 24 hour  Intake 809.6 ml  Output 3350 ml  Net -2540.4 ml    Filed Weights   11/01/22 1920 11/05/22 0500  Weight: 60.1 kg 64.2 kg    Examination:  General exam: Appears calm and comfortable  Respiratory system: Clear to auscultation. Respiratory effort normal. Cardiovascular system: S1 & S2 heard, RRR. No JVD, murmurs, rubs, gallops or clicks. No pedal edema. Gastrointestinal system: Abdomen is nondistended, soft and  nontender. No organomegaly or masses felt. Normal bowel sounds heard. Central nervous system: Alert   Central nervous system: Lethargic and not oriented.  No focal deficit.   Data Reviewed: I have personally reviewed following labs and imaging studies  CBC: Recent Labs  Lab 11/01/22 1913 11/02/22 0620 11/03/22 0610 11/04/22 0834  WBC 14.8* 15.8* 12.2* 9.5  NEUTROABS 12.8* 14.2* 10.7* 7.9*  HGB 11.3* 11.8* 10.8* 11.6*  HCT 34.5* 36.9* 33.9* 35.7*  MCV 91.8 93.9 91.9 91.8  PLT 120* 134* 136* [id number]    Basic Metabolic Panel: Recent Labs  Lab 11/01/22 1913 11/02/22 0620 11/03/22 0610 11/04/22 0834  NA 140 143 144 144  K 3.1* 2.9* 3.6 3.2*  CL 103 109 113* 113*  CO2 '25 24 22 23  '$ GLUCOSE 181* 161* 162* 158*  BUN 24* 24* 36* 36*  CREATININE 1.34* 1.34* 1.98* 2.03*  CALCIUM 9.0 8.6* 8.7* 8.3*  MG  --  1.7  --   --     GFR: Estimated Creatinine Clearance: 29.9 mL/min (A) (by C-G formula based on SCr of 2.03 mg/dL (H)). Liver Function Tests: Recent Labs  Lab 11/01/22 1913 11/02/22 0620  AST 24 41  ALT 22 31  ALKPHOS 53 56  BILITOT 0.8 1.1  PROT 6.5 6.4*  ALBUMIN 3.2* 3.0*    No results for  input(s): "LIPASE", "AMYLASE" in the last 168 hours. No results for input(s): "AMMONIA" in the last 168 hours. Coagulation Profile: Recent Labs  Lab 11/01/22 1913  INR 1.4*    Cardiac Enzymes: No results for input(s): "CKTOTAL", "CKMB", "CKMBINDEX", "TROPONINI" in the last 168 hours. BNP (last 3 results) No results for input(s): "PROBNP" in the last 8760 hours. HbA1C: No results for input(s): "HGBA1C" in the last 72 hours.  CBG: Recent Labs  Lab 11/04/22 1117 11/04/22 1225 11/04/22 1606 11/04/22 1623 11/04/22 2131  GLUCAP 216* 222* 181* 185* 106*    Lipid Profile: No results for input(s): "CHOL", "HDL", "LDLCALC", "TRIG", "CHOLHDL", "LDLDIRECT" in the last 72 hours. Thyroid Function Tests: No results for input(s): "TSH", "T4TOTAL", "FREET4", "T3FREE", "THYROIDAB" in the last 72 hours. Anemia Panel: No results for input(s): "VITAMINB12", "FOLATE", "FERRITIN", "TIBC", "IRON", "RETICCTPCT" in the last 72 hours. Sepsis Labs: Recent Labs  Lab 11/01/22 1913 11/01/22 2146  LATICACIDVEN 1.4 1.5     Recent Results (from the past 240 hour(s))  Blood Culture (routine x 2)     Status: None (Preliminary result)   Collection Time: 11/01/22  7:13 PM   Specimen: BLOOD LEFT WRIST  Result Value Ref Range Status   Specimen Description BLOOD LEFT WRIST  Final   Special Requests   Final    BOTTLES DRAWN AEROBIC AND ANAEROBIC Blood Culture results may not be optimal due to an excessive volume of blood received in culture bottles   Culture   Final    NO GROWTH 4 DAYS Performed at [facility], [place] [address], [place], [place] [address]    Report Status PENDING  Incomplete  MRSA Next Gen by PCR, Nasal     Status: None   Collection Time: 11/01/22  7:27 PM   Specimen: Nasal Mucosa; Nasal Swab  Result Value Ref Range Status   MRSA by PCR Next Gen NOT DETECTED NOT DETECTED Final    Comment: (NOTE) The GeneXpert MRSA Assay (FDA approved for NASAL specimens only), is one  component of a comprehensive MRSA colonization surveillance program. It is not intended to diagnose MRSA infection nor to guide or monitor treatment  for MRSA infections. Test performance is not FDA approved in patients less than 42 years old. Performed at [facility], [place] [address], [place], [place] [address]   Blood Culture (routine x 2)     Status: None (Preliminary result)   Collection Time: 11/01/22  7:39 PM   Specimen: BLOOD  Result Value Ref Range Status   Specimen Description BLOOD BLOOD RIGHT FOREARM  Final   Special Requests   Final    BOTTLES DRAWN AEROBIC AND ANAEROBIC Blood Culture results may not be optimal due to an excessive volume of blood received in culture bottles   Culture   Final    NO GROWTH 4 DAYS Performed at [facility], [place] [address], [place], [place] [address]    Report Status PENDING  Incomplete  Resp panel by RT-PCR (RSV, Flu A&B, Covid) Anterior Nasal Swab     Status: None   Collection Time: 11/01/22  8:12 PM   Specimen: Anterior Nasal Swab  Result Value Ref Range Status   SARS Coronavirus 2 by RT PCR NEGATIVE NEGATIVE Final   Influenza A by PCR NEGATIVE NEGATIVE Final   Influenza B by PCR NEGATIVE NEGATIVE Final    Comment: (NOTE) The Xpert Xpress SARS-CoV-2/FLU/RSV plus assay is intended as an aid in the diagnosis of influenza from Nasopharyngeal swab specimens and should not be used as a sole basis for treatment. Nasal washings and aspirates are unacceptable for Xpert Xpress SARS-CoV-2/FLU/RSV testing.  Fact Sheet for Patients: EntrepreneurPulse.com.au  Fact Sheet for Healthcare Providers: IncredibleEmployment.be  This test is not yet approved or cleared by the [place] FDA and has been authorized for detection and/or diagnosis of SARS-CoV-2 by FDA under an Emergency Use Authorization (EUA). This EUA will remain in effect (meaning this test can be used) for the duration of the COVID-19  declaration under Section 564(b)(1) of the Act, 21 U.S.C. section 360bbb-3(b)(1), unless the authorization is terminated or revoked.     Resp Syncytial Virus by PCR NEGATIVE NEGATIVE Final    Comment: (NOTE) Fact Sheet for Patients: EntrepreneurPulse.com.au  Fact Sheet for Healthcare Providers: IncredibleEmployment.be  This test is not yet approved or cleared by the [place] FDA and has been authorized for detection and/or diagnosis of SARS-CoV-2 by FDA under an Emergency Use Authorization (EUA). This EUA will remain in effect (meaning this test can be used) for the duration of the COVID-19 declaration under Section 564(b)(1) of the Act, 21 U.S.C. section 360bbb-3(b)(1), unless the authorization is terminated or revoked.  Performed at [facility], [place] [address] [place], [place] [address]      Radiology Studies: No results found.  Scheduled Meds:  alfuzosin  10 mg Oral QPC breakfast   amLODipine  10 mg Oral Daily   aspirin EC  81 mg Oral Daily   atorvastatin  20 mg Oral Daily   benztropine  0.5 mg Oral Daily   carvedilol  12.5 mg Oral BID WC   heparin  5,000 Units Subcutaneous Q8H   insulin aspart  0-5 Units Subcutaneous QHS   insulin aspart  0-9 Units Subcutaneous TID WC   risperiDONE  0.5 mg Oral Daily   risperiDONE  3 mg Oral QHS   sertraline  50 mg Oral Daily   tamsulosin  0.4 mg Oral QPC breakfast   Continuous Infusions:  sodium chloride 125 mL/hr at 11/05/22 0510   cefTRIAXone (ROCEPHIN)  IV 1 g (11/05/22 0855)  Total time spent: 29 minutes   LOS:  3 days   Bonnell Public, MD [facility]  11/05/2022, 11:45 AM   *Please note that this is a verbal dictation therefore any spelling or grammatical errors are due to the "[employer]" system interpretation.  Please page via [place] and do not message via secure chat for urgent patient care matters. Secure chat can be used for non urgent patient care  matters.  How to contact the [facility] Attending or Consulting provider [place] or covering provider during after hours [place], for this patient?  Check the care team in [facility] and look for a) attending/consulting [facility] provider listed and b) the [facility] team listed. Page or secure chat [phone]. Log into [email or web address] and use [place]'s universal password to access. If you do not have the password, please contact the hospital operator. Locate the [facility] provider you are looking for under [facility] and page to a number that you can be directly reached. If you still have difficulty reaching the provider, please page the [facility] (Director on Call) for the Hospitalists listed on amion for assistance.

## 2022-11-05 NOTE — Progress Notes (Signed)
Received patient with bladder distention, swelling of the penis and scrotum noted. Also, foley is leaking. As per morning nurse, after she drained out the bag in the afternoon she noticed some leaking and tried to deflate and inflate the balloon. Also, no urine output since then via foley. Bladder scan now is >761m. T. Opyd, MD made aware. Ordered to replace new foley. New foley inserted along with the charge nurse on duty. Initial output is 16059mto a tea-colored to a blood-tinged urine. Will continue to monitor patient for any signs of urinary obstruction.

## 2022-11-05 NOTE — Progress Notes (Signed)
Patient is observed to have a distended bladder. Did a bladder scan: >993m. T. Opyd, MD informed and ordered for foley insertion. Foley catheter fr.16 inserted together with Nurse JDewitt Rota Securing device in place. No dependent loops, foley bag not touching the floor. Initial output is 15053mto a dark red urine.

## 2022-11-06 ENCOUNTER — Inpatient Hospital Stay (HOSPITAL_COMMUNITY): Payer: Medicare Other

## 2022-11-06 DIAGNOSIS — N3000 Acute cystitis without hematuria: Secondary | ICD-10-CM | POA: Diagnosis not present

## 2022-11-06 LAB — CBC WITH DIFFERENTIAL/PLATELET
Abs Immature Granulocytes: 0.2 10*3/uL — ABNORMAL HIGH (ref 0.00–0.07)
Basophils Absolute: 0 10*3/uL (ref 0.0–0.1)
Basophils Relative: 1 %
Eosinophils Absolute: 0.3 10*3/uL (ref 0.0–0.5)
Eosinophils Relative: 3 %
HCT: 32.8 % — ABNORMAL LOW (ref 39.0–52.0)
Hemoglobin: 10.7 g/dL — ABNORMAL LOW (ref 13.0–17.0)
Immature Granulocytes: 3 %
Lymphocytes Relative: 13 %
Lymphs Abs: 1 10*3/uL (ref 0.7–4.0)
MCH: 30 pg (ref 26.0–34.0)
MCHC: 32.6 g/dL (ref 30.0–36.0)
MCV: 91.9 fL (ref 80.0–100.0)
Monocytes Absolute: 0.6 10*3/uL (ref 0.1–1.0)
Monocytes Relative: 8 %
Neutro Abs: 5.7 10*3/uL (ref 1.7–7.7)
Neutrophils Relative %: 72 %
Platelets: 184 10*3/uL (ref 150–400)
RBC: 3.57 MIL/uL — ABNORMAL LOW (ref 4.22–5.81)
RDW: 14.2 % (ref 11.5–15.5)
WBC: 7.9 10*3/uL (ref 4.0–10.5)
nRBC: 0 % (ref 0.0–0.2)

## 2022-11-06 LAB — RENAL FUNCTION PANEL
Albumin: 2.6 g/dL — ABNORMAL LOW (ref 3.5–5.0)
Anion gap: 9 (ref 5–15)
BUN: 28 mg/dL — ABNORMAL HIGH (ref 8–23)
CO2: 21 mmol/L — ABNORMAL LOW (ref 22–32)
Calcium: 8.8 mg/dL — ABNORMAL LOW (ref 8.9–10.3)
Chloride: 118 mmol/L — ABNORMAL HIGH (ref 98–111)
Creatinine, Ser: 1.48 mg/dL — ABNORMAL HIGH (ref 0.61–1.24)
GFR, Estimated: 50 mL/min — ABNORMAL LOW (ref >60)
Glucose, Bld: 114 mg/dL — ABNORMAL HIGH (ref 70–99)
Phosphorus: 3.4 mg/dL (ref 2.5–4.6)
Potassium: 3.3 mmol/L — ABNORMAL LOW (ref 3.5–5.1)
Sodium: 148 mmol/L — ABNORMAL HIGH (ref 135–145)

## 2022-11-06 LAB — GLUCOSE, CAPILLARY
Glucose-Capillary: 111 mg/dL — ABNORMAL HIGH (ref 70–99)
Glucose-Capillary: 224 mg/dL — ABNORMAL HIGH (ref 70–99)
Glucose-Capillary: 130 mg/dL — ABNORMAL HIGH (ref 70–99)
Glucose-Capillary: 110 mg/dL — ABNORMAL HIGH (ref 70–99)

## 2022-11-06 LAB — CULTURE, BLOOD (ROUTINE X 2)
Culture: NO GROWTH
Culture: NO GROWTH

## 2022-11-06 LAB — URINALYSIS, ROUTINE W REFLEX MICROSCOPIC
Bilirubin Urine: NEGATIVE
Glucose, UA: NEGATIVE mg/dL
Ketones, ur: NEGATIVE mg/dL
Leukocytes,Ua: NEGATIVE
Nitrite: NEGATIVE
Protein, ur: 30 mg/dL — AB
Specific Gravity, Urine: 1.005 (ref 1.005–1.030)
pH: 5 (ref 5.0–8.0)

## 2022-11-06 LAB — MAGNESIUM: Magnesium: 2.1 mg/dL (ref 1.7–2.4)

## 2022-11-06 MED ORDER — POTASSIUM CHLORIDE IN NACL 20-0.9 MEQ/L-% IV SOLN
INTRAVENOUS | Status: DC
  Filled 2022-11-06 (×2): qty 1000

## 2022-11-06 MED ORDER — CHLORHEXIDINE GLUCONATE CLOTH 2 % EX PADS
6.0000 | MEDICATED_PAD | Freq: Every day | CUTANEOUS | Status: DC
  Administered 2022-11-06 – 2022-11-22 (×17): 6 via TOPICAL

## 2022-11-06 NOTE — Progress Notes (Signed)
PROGRESS NOTE    Fred [name]  [id number] DOB: 1951/03/16 DOA: 11/01/2022 PCP: [facility]   Brief Narrative:  72 year old African-American male history of intellectual disability since birth, history of hypertension, CKD stage IIIa, type 2 diabetes, BPH with urinary obstruction who presented to the ER with sudden onset of mental status change.  Patient normally goes to adult daycare.  He lives in a group home with his 2 other intellectually disabled brother.  His sister Fred [name] is his legal guardian.  She states that she was told that the patient suddenly slumped over while he was having dinner.  She was told by the adult daycare that the patient was somewhat slower to answer questions.  No reports of fever, chills, nausea or vomiting.   On arrival temp 98.8 but increased to 100.1.  Heart rate 99 blood pressure 141/91 satting 97% on room air.  Catheterized UA showed large leukocyte esterase, 6-10 white cells, few bacteria.  Lactic acid was normal at 1.5.  COVID, influenza and RSV negative.  Patient was admitted with acute metabolic encephalopathy secondary to UTI.  Patient is now having hematuria.  11/05/2022: Patient seen alongside patient's sister (patient's legal guardian).  Patient's sister was updated extensively.  Will repeat BMP.  Will monitor patient for possible urinary retention.  Complete IV antibiotics tomorrow.  Repeat urinalysis.  Unfortunately, urine culture was never sent.  11/06/2022: Patient seen alongside patient's sister and nurse.  Repeat urinalysis continues to reveal hematuria.  Hematuria was initially explained to be secondary to traumatic Foley catheterization.  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  Radiology has advised repeating renal ultrasound in 6 months.  No hydronephrosis.  Swelling of the penile shaft and the glans noted.  Patient has indwelling Foley catheter.   Indwelling Foley catheter is in the bladder.  Patient is known to a urologist.  Will elevate the scrotum and the penile shaft.  If swelling persists by tomorrow, consider consulting urology team.  Patient will complete antibiotics today.  Possible discharge tomorrow.  Patient may have to be discharged with Foley catheter in place.  Patient can follow-up with urology on discharge as well.  Assessment & Plan:   Principal Problem:   Acute cystitis without hematuria Active Problems:   BPH with urinary obstruction   Hypertension associated with diabetes ([place])   Intellectual disability   Type 2 diabetes mellitus without complication, without long-term current use of insulin (HCC)   Acute metabolic encephalopathy   Hypokalemia   DNR (do not resuscitate)/DNI(Do Not Intubate)   Severe sepsis (HCC)   AKI (acute kidney injury) ([place])  Severe sepsis secondary to acute cystitis, POA: Patient meets criteria for severe sepsis based on tachycardia, tachypnea, fever as well as leukocytosis and acute encephalopathy and acute kidney injury.  Continue antibiotics.  Unfortunately, urine was never sent for culture.  He is afebrile.  Leukocytosis resolved. 11/05/2022: Sepsis physiology has resolved.  Complete course of antibiotics.  Repeat urinalysis tomorrow. 11/06/2022: Sepsis physiology has resolved.  Complete course of antibiotics today.  Repeat urinalysis.  Urine culture was never done.  Acute toxic and metabolic encephalopathy Likely in the setting of UTI.  Treat underlying cause.  He is back at his baseline.  Typically he just sometimes nods for yes and no. 11/05/2022: Resolved significantly.  Seems to be back to baseline.   Acute kidney injury on CKD 3A:  Patient's baseline creatinine actually is between 0.8-1.0 as opposed to  Scr 1.3 as mentioned in HPI, that rules out CKD and we will send AKI, creatinine rising, 2.03 today which is very close to what he was yesterday but higher than his baseline.  He received  24 hours of fluid yesterday, I will resume fluids for another 24 hours today and recheck labs in the morning. 11/05/2022: AKI in the setting of UTI/possible sepsis.  Repeat renal panel. 11/06/2022: Acute kidney injury is resolving.  Serum creatinine is down to 1.48.  Sodium of 148 and potassium of 3.3 noted.  Will start patient on IV fluid normal saline +20 of KCl at a rate of 75 cc/h.  Repeat renal panel and electrolytes in a.m.  Hypokalemia:  -Potassium today was 3.3. -Will replace.      Type 2 diabetes mellitus without complication, without long-term current use of insulin (HCC) Hold metformin.  Interestingly his hemoglobin A1c is 5.5 but he is intermittently hyperglycemic here.  Continue SSI for now.   Intellectual disability On multiple psych meds. 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   Hypertension associated with diabetes ([place]) Slightly elevated diastolic blood pressure at times, continue home regimen which includes amlodipine and Coreg.  Continue to hold lisinopril and hydrochlorothiazide due to rising creatinine.   BPH with urinary obstruction/gross hematuria He was catheterized in the ED for urine sample.  Likely caused trauma and now patient has gross hematuria which is improving.  Sister says pt sees urology q90 days due to prostate issues.  Continue home dose of alfuzosin.   DNR (do not resuscitate)/DNI(Do Not Intubate) Discussed with pt's sister/HCPOA/legal guardian. She has elected DNR/DNI for patient. Pt will need yellow DNR form completed at discharge.  DVT prophylaxis: heparin injection 5,000 Units Start: 11/02/22 0600 SCDs Start: 11/02/22 0551   Code Status: DNR  Family Communication: Sister. Status is: Inpatient Remains inpatient appropriate because: Still with hematuria.     Estimated body mass index is 21.15 kg/m as calculated from the following:   Height as of this encounter: '5\' 8"'$  (1.727 m).   Weight as of this  encounter: 63.1 kg.    Nutritional Assessment: Body mass index is 21.15 kg/m.Marland Kitchen Seen by dietician.  I agree with the assessment and plan as outlined below: Nutrition Status:        . Skin Assessment: I have examined the patient's skin and I agree with the wound assessment as performed by the wound care RN as outlined below:    Consultants:  None  Procedures:  None  Antimicrobials:  Anti-infectives (From admission, onward)    Start     Dose/Rate Route Frequency Ordered Stop   11/02/22 2000  vancomycin (VANCOREADY) IVPB 750 mg/150 mL  Status:  Discontinued        750 mg 150 mL/hr over 60 Minutes Intravenous Every 24 hours 11/01/22 2050 11/02/22 0058   11/02/22 1000  cefTRIAXone (ROCEPHIN) 1 g in sodium chloride 0.9 % 100 mL IVPB        1 g 200 mL/hr over 30 Minutes Intravenous Every 24 hours 11/02/22 0058     11/02/22 0600  ceFEPIme (MAXIPIME) 2 g in sodium chloride 0.9 % 100 mL IVPB  Status:  Discontinued        2 g 200 mL/hr over 30 Minutes Intravenous Every 12 hours 11/01/22 2050 11/02/22 0058   11/01/22 1915  ceFEPIme (MAXIPIME) 2 g in sodium chloride 0.9 % 100 mL IVPB        2  g 200 mL/hr over 30 Minutes Intravenous  Once 11/01/22 1913 11/01/22 2005   11/01/22 1915  metroNIDAZOLE (FLAGYL) IVPB 500 mg        500 mg 100 mL/hr over 60 Minutes Intravenous  Once 11/01/22 1913 11/01/22 2048   11/01/22 1915  vancomycin (VANCOCIN) IVPB 1000 mg/200 mL premix        1,000 mg 200 mL/hr over 60 Minutes Intravenous  Once 11/01/22 1913 11/01/22 2109         Subjective: No significant history from patient. Swelling of the penile shaft and glans penis.  Objective: Vitals:   11/06/22 0500 11/06/22 0800 11/06/22 1128 11/06/22 1540  BP:  (!) 153/77 128/73 139/81  Pulse:  86 83   Resp:  17    Temp:  97.9 F (36.6 C) 98.3 F (36.8 C) 97.6 F (36.4 C)  TempSrc:  Oral Axillary Axillary  SpO2:      Weight: 63.1 kg     Height:        Intake/Output Summary (Last 24  hours) at 11/06/2022 1752 Last data filed at 11/06/2022 1541 Gross per 24 hour  Intake 970.86 ml  Output 7000 ml  Net -6029.14 ml    Filed Weights   11/01/22 1920 11/05/22 0500 11/06/22 0500  Weight: 60.1 kg 64.2 kg 63.1 kg    Examination:  General exam: Appears calm and comfortable  Respiratory system: Clear to auscultation. Respiratory effort normal. Cardiovascular system: S1 & S2 heard, RRR. No JVD, murmurs, rubs, gallops or clicks. No pedal edema. Gastrointestinal system: Abdomen is nondistended, soft and nontender. No organomegaly or masses felt. Normal bowel sounds heard. Central nervous system: Alert  Urological: Foley catheter in place.  Swelling of the glans penis and penile shaft.  Central nervous system: Awake and alert.  Moves all extremities.   Data Reviewed: I have personally reviewed following labs and imaging studies  CBC: Recent Labs  Lab 11/01/22 1913 11/02/22 0620 11/03/22 0610 11/04/22 0834 11/06/22 0558  WBC 14.8* 15.8* 12.2* 9.5 7.9  NEUTROABS 12.8* 14.2* 10.7* 7.9* 5.7  HGB 11.3* 11.8* 10.8* 11.6* 10.7*  HCT 34.5* 36.9* 33.9* 35.7* 32.8*  MCV 91.8 93.9 91.9 91.8 91.9  PLT 120* 134* 136* 167 [id number]    Basic Metabolic Panel: Recent Labs  Lab 11/01/22 1913 11/02/22 0620 11/03/22 0610 11/04/22 0834 11/06/22 0558  NA 140 143 144 144 148*  K 3.1* 2.9* 3.6 3.2* 3.3*  CL 103 109 113* 113* 118*  CO2 '25 24 22 23 '$ 21*  GLUCOSE 181* 161* 162* 158* 114*  BUN 24* 24* 36* 36* 28*  CREATININE 1.34* 1.34* 1.98* 2.03* 1.48*  CALCIUM 9.0 8.6* 8.7* 8.3* 8.8*  MG  --  1.7  --   --  2.1  PHOS  --   --   --   --  3.4    GFR: Estimated Creatinine Clearance: 40.3 mL/min (A) (by C-G formula based on SCr of 1.48 mg/dL (H)). Liver Function Tests: Recent Labs  Lab 11/01/22 1913 11/02/22 0620 11/06/22 0558  AST 24 41  --   ALT 22 31  --   ALKPHOS 53 56  --   BILITOT 0.8 1.1  --   PROT 6.5 6.4*  --   ALBUMIN 3.2* 3.0* 2.6*    No results for input(s):  "LIPASE", "AMYLASE" in the last 168 hours. No results for input(s): "AMMONIA" in the last 168 hours. Coagulation Profile: Recent Labs  Lab 11/01/22 1913  INR 1.4*    Cardiac Enzymes: No  results for input(s): "CKTOTAL", "CKMB", "CKMBINDEX", "TROPONINI" in the last 168 hours. BNP (last 3 results) No results for input(s): "PROBNP" in the last 8760 hours. HbA1C: No results for input(s): "HGBA1C" in the last 72 hours.  CBG: Recent Labs  Lab 11/05/22 1147 11/05/22 2257 11/06/22 0822 11/06/22 1125 11/06/22 1547  GLUCAP 333* 160* 111* 224* 130*    Lipid Profile: No results for input(s): "CHOL", "HDL", "LDLCALC", "TRIG", "CHOLHDL", "LDLDIRECT" in the last 72 hours. Thyroid Function Tests: No results for input(s): "TSH", "T4TOTAL", "FREET4", "T3FREE", "THYROIDAB" in the last 72 hours. Anemia Panel: No results for input(s): "VITAMINB12", "FOLATE", "FERRITIN", "TIBC", "IRON", "RETICCTPCT" in the last 72 hours. Sepsis Labs: Recent Labs  Lab 11/01/22 1913 11/01/22 2146  LATICACIDVEN 1.4 1.5     Recent Results (from the past 240 hour(s))  Blood Culture (routine x 2)     Status: None   Collection Time: 11/01/22  7:13 PM   Specimen: BLOOD LEFT WRIST  Result Value Ref Range Status   Specimen Description BLOOD LEFT WRIST  Final   Special Requests   Final    BOTTLES DRAWN AEROBIC AND ANAEROBIC Blood Culture results may not be optimal due to an excessive volume of blood received in culture bottles   Culture   Final    NO GROWTH 5 DAYS Performed at [facility], [place] [address], [place], [place] [address]    Report Status 11/06/2022 FINAL  Final  MRSA Next Gen by PCR, Nasal     Status: None   Collection Time: 11/01/22  7:27 PM   Specimen: Nasal Mucosa; Nasal Swab  Result Value Ref Range Status   MRSA by PCR Next Gen NOT DETECTED NOT DETECTED Final    Comment: (NOTE) The GeneXpert MRSA Assay (FDA approved for NASAL specimens only), is one component of a comprehensive MRSA  colonization surveillance program. It is not intended to diagnose MRSA infection nor to guide or monitor treatment for MRSA infections. Test performance is not FDA approved in patients less than 79 years old. Performed at [facility], [place] [address] [place], [place] [address]   Blood Culture (routine x 2)     Status: None   Collection Time: 11/01/22  7:39 PM   Specimen: BLOOD  Result Value Ref Range Status   Specimen Description BLOOD BLOOD RIGHT FOREARM  Final   Special Requests   Final    BOTTLES DRAWN AEROBIC AND ANAEROBIC Blood Culture results may not be optimal due to an excessive volume of blood received in culture bottles   Culture   Final    NO GROWTH 5 DAYS Performed at [facility], [place] [address], [place], [place] [address]    Report Status 11/06/2022 FINAL  Final  Resp panel by RT-PCR (RSV, Flu A&B, Covid) Anterior Nasal Swab     Status: None   Collection Time: 11/01/22  8:12 PM   Specimen: Anterior Nasal Swab  Result Value Ref Range Status   SARS Coronavirus 2 by RT PCR NEGATIVE NEGATIVE Final   Influenza A by PCR NEGATIVE NEGATIVE Final   Influenza B by PCR NEGATIVE NEGATIVE Final    Comment: (NOTE) The Xpert Xpress SARS-CoV-2/FLU/RSV plus assay is intended as an aid in the diagnosis of influenza from Nasopharyngeal swab specimens and should not be used as a sole basis for treatment. Nasal washings and aspirates are unacceptable for Xpert Xpress SARS-CoV-2/FLU/RSV testing.  Fact Sheet for Patients: EntrepreneurPulse.com.au  Fact Sheet for Healthcare Providers: IncredibleEmployment.be  This test is not yet approved or cleared by the [place] and has been authorized for detection and/or diagnosis of SARS-CoV-2 by FDA under an Emergency Use Authorization (EUA). This EUA will remain in effect (meaning this test can be used) for the duration of the COVID-19 declaration under Section 564(b)(1) of the Act, 21  U.S.C. section 360bbb-3(b)(1), unless the authorization is terminated or revoked.     Resp Syncytial Virus by PCR NEGATIVE NEGATIVE Final    Comment: (NOTE) Fact Sheet for Patients: EntrepreneurPulse.com.au  Fact Sheet for Healthcare Providers: IncredibleEmployment.be  This test is not yet approved or cleared by the [place] FDA and has been authorized for detection and/or diagnosis of SARS-CoV-2 by FDA under an Emergency Use Authorization (EUA). This EUA will remain in effect (meaning this test can be used) for the duration of the COVID-19 declaration under Section 564(b)(1) of the Act, 21 U.S.C. section 360bbb-3(b)(1), unless the authorization is terminated or revoked.  Performed at [facility], [place] [address] [place], [place] [address]      Radiology Studies: US RENAL  Result Date: 11/06/2022 CLINICAL DATA:  Hematuria EXAM: RENAL / URINARY TRACT ULTRASOUND COMPLETE COMPARISON:  CT abdomen pelvis 02/27/2012 FINDINGS: Right Kidney: Renal measurements: 11.4 x 5.6 x 5.5 cm = volume: 183 mL. Normal renal cortical thickness and echogenicity. No hydronephrosis. Multiple renal cysts including a 2.5 x 2.4 cm cyst superior pole and a 0.9 x 0.9 cm cyst superior pole. Additionally there is a 0.9 x 0.9 cm echogenic mass, too small to characterize superior pole right kidney. No imaging follow-up needed for the simple cysts. Left Kidney: Renal measurements: 12.2 x 6.2 x 4.4 cm = volume: 170.7 mL. Normal renal cortical thickness and echogenicity. No hydronephrosis. Within the superior pole there is a 3.7 x 2.8 x 2.1 cm cyst. There is an additional 3.0 x 2.8 cm cyst within the left kidney. No imaging follow-up needed for the simple cysts. Bladder: Decompressed with Foley catheter. Other: None. IMPRESSION: 1. No hydronephrosis. 2. There is a 9 mm echogenic mass within the superior pole of the right kidney, too small to characterize. 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 Consider follow-up renal ultrasound in 6 months to assess for stability. Electronically Signed   By: Lovey Newcomer M.D.   On: 11/06/2022 11:50   DG CHEST PORT 1 VIEW  Result Date: 11/06/2022 CLINICAL DATA:  Altered mental status. EXAM: PORTABLE CHEST 1 VIEW COMPARISON:  11/01/2022. FINDINGS: New small right pleural effusion with adjacent atelectasis in the right lung base. Stable cardiomegaly and mediastinal contours. No pneumothorax. IMPRESSION: New small right pleural effusion with adjacent atelectasis in the right lung base. Electronically Signed   By: Emmit Alexanders M.D.   On: 11/06/2022 10:29    Scheduled Meds:  alfuzosin  10 mg Oral QPC breakfast   amLODipine  10 mg Oral Daily   aspirin EC  81 mg Oral Daily   atorvastatin  20 mg Oral Daily   benztropine  0.5 mg Oral Daily   carvedilol  12.5 mg Oral BID WC   Chlorhexidine Gluconate Cloth  6 each Topical Daily   heparin  5,000 Units Subcutaneous Q8H   insulin aspart  0-5 Units Subcutaneous QHS   insulin aspart  0-9 Units Subcutaneous TID WC   risperiDONE  0.5 mg Oral Daily   risperiDONE  3 mg Oral QHS   sertraline  50 mg Oral Daily   tamsulosin  0.4 mg Oral QPC breakfast  Continuous Infusions:  0.9 % NaCl with KCl 20 mEq / L 75 mL/hr at 11/06/22 1541   cefTRIAXone (ROCEPHIN)  IV Stopped (11/06/22 0959)  Total time spent: 29 minutes   LOS: 4 days   Bonnell Public, MD [facility]  11/06/2022, 5:52 PM   *Please note that this is a verbal dictation therefore any spelling or grammatical errors are due to the "[place] [facility]" system interpretation.  Please page via [place] and do not message via secure chat for urgent patient care matters. Secure chat can be used for non urgent patient care matters.  How to contact the [facility] Attending or Consulting provider [place] or covering provider during after hours [place], for this patient?  Check the care team in [facility] and look for a) attending/consulting TR[facility]  provider listed and b) the [facility] team listed. Page or secure chat [phone]. Log into [email or web address] and use [place]'s universal password to access. If you do not have the password, please contact the hospital operator. Locate the [facility] provider you are looking for under [facility] and page to a number that you can be directly reached. If you still have difficulty reaching the provider, please page the [facility] (Director on Call) for the Hospitalists listed on amion for assistance.

## 2022-11-06 NOTE — Progress Notes (Signed)
  On assessment,patient's scrotum still swollen. Increased swelling noted on the penis. MD  Dana Allan) informed.  MD and nurse assessed pt. MD instructs elevation of the affected area. Patients penis and scrotum elevated with a towel and bed pad.

## 2022-11-07 DIAGNOSIS — N3000 Acute cystitis without hematuria: Secondary | ICD-10-CM | POA: Diagnosis not present

## 2022-11-07 LAB — GLUCOSE, CAPILLARY
Glucose-Capillary: 124 mg/dL — ABNORMAL HIGH (ref 70–99)
Glucose-Capillary: 60 mg/dL — ABNORMAL LOW (ref 70–99)
Glucose-Capillary: 90 mg/dL (ref 70–99)
Glucose-Capillary: 169 mg/dL — ABNORMAL HIGH (ref 70–99)
Glucose-Capillary: 152 mg/dL — ABNORMAL HIGH (ref 70–99)
Glucose-Capillary: 162 mg/dL — ABNORMAL HIGH (ref 70–99)
Glucose-Capillary: 52 mg/dL — ABNORMAL LOW (ref 70–99)
Glucose-Capillary: 95 mg/dL (ref 70–99)
Glucose-Capillary: 246 mg/dL — ABNORMAL HIGH (ref 70–99)

## 2022-11-07 LAB — BASIC METABOLIC PANEL
Anion gap: 9 (ref 5–15)
BUN: 18 mg/dL (ref 8–23)
CO2: 24 mmol/L (ref 22–32)
Calcium: 8.7 mg/dL — ABNORMAL LOW (ref 8.9–10.3)
Chloride: 113 mmol/L — ABNORMAL HIGH (ref 98–111)
Creatinine, Ser: 1.14 mg/dL (ref 0.61–1.24)
GFR, Estimated: >60 mL/min (ref >60)
Glucose, Bld: 122 mg/dL — ABNORMAL HIGH (ref 70–99)
Potassium: 3.3 mmol/L — ABNORMAL LOW (ref 3.5–5.1)
Sodium: 146 mmol/L — ABNORMAL HIGH (ref 135–145)

## 2022-11-07 LAB — MAGNESIUM: Magnesium: 1.7 mg/dL (ref 1.7–2.4)

## 2022-11-07 MED ORDER — MAGNESIUM SULFATE 2 GM/50ML IV SOLN
2.0000 g | Freq: Once | INTRAVENOUS | Status: AC
  Administered 2022-11-07: 2 g via INTRAVENOUS
  Filled 2022-11-07: qty 50

## 2022-11-07 MED ORDER — FINASTERIDE 5 MG PO TABS: 5.0000 mg | ORAL_TABLET | Freq: Every day | ORAL | 2 refills | Status: DC

## 2022-11-07 MED ORDER — POTASSIUM CHLORIDE 20 MEQ PO PACK
40.0000 meq | PACK | ORAL | Status: AC
  Administered 2022-11-07 (×2): 40 meq via ORAL
  Filled 2022-11-07 (×2): qty 2

## 2022-11-07 NOTE — Discharge Summary (Signed)
Physician Discharge Summary  Fred [name] [id number] DOB: [birth year] DOA: 11/01/2022  PCP: [facility] [place] date: 11/01/2022 Discharge date: 11/08/2022  Admitted From: Group home Disposition: Group home  Recommendations for Outpatient Follow-up:  Follow up with PCP in 1-2 weeks Foley catheter in place for obstructive uropathy, started on finasteride.  Continues on alfuzosin.  Will need follow-up with his urologist for voiding trial versus additional treatment. Will need repeat renal ultrasound 6 months for follow-up of renal lesion Please obtain BMP in one week  Home Health: RN Equipment/Devices: Foley cath better  Discharge Condition: Stable CODE STATUS: DNR Diet recommendation: Heart healthy/consistent carbohydrate diet  History of present illness:  Fred [name] is a 72 year old male with past medical history significant for intellectual disability, HTN, CKD stage IIIa, type 2 diabetes mellitus, BPH with urinary obstruction who presented to [facility] ED on 3/5 with sudden onset mental status change/confusion.  Patient typically goes to adult daycare and currently resides in a group home.  Patient reportedly suddenly slumped over while having dinner and was slow to answer questions.  No reports of fever, chills, nausea/vomiting.  In the ED, temperature 100.1 F rectally, BP 177/74, HR 107, RR 23, SpO2 100% on room air.  Sodium 140, potassium 3.1, chloride 103, CO2 25, glucose 181, BUN 24, creatinine 1.34.  AST 24, ALT 22, total bilirubin 0.8.  Lactic acid 1.4.  WBC 14.8, hemoglobin 11.3, platelet count 120.  COVID-19/influenza/RSV PCR negative.  Urinalysis with large leukocytes, negative nitrite, few bacteria, 6-10 WBCs.  Chest x-ray with small right pleural effusion with adjacent atelectasis right lung base.  CT head without contrast with no acute intracranial abnormality.  EKG with normal sinus rhythm.  EDP started on LR infusion,  vancomycin/cefepime-metronidazole, given potassium replacement.  TRH consulted for admission for further evaluation management of acute metabolic encephalopathy, concern for UTI.  Hospital course:  Acute metabolic encephalopathy Urinary tract infection Patient presenting to ED from a group home after being found confused.  Patient with slightly elevated temperature of 100.1 F rectally, elevated WBC count of 14.8.  COVID/influenza/RSV PCR negative.  Urinalysis with large leukocytes, negative nitrite, few bacteria, and and 6-10 WBCs.  Chest x-ray negative for focal consolidation.  No urine culture was performed.  Blood cultures x 2 showed no growth x 5 days.  MRSA PCR negative.  Patient was initially started on vancomycin, cefepime, metronidazole and transition to ceftriaxone in which he completed a total course of antibiotics of 7 days.  Patient's confusion has now resolved and appears to be at his typical baseline.  Outpatient follow-up with PCP.  Obstructive uropathy BPH Patient during his hospitalization with several episodes of urinary retention required In-N-Out catheterizations.  Patient has a history of BPH, followed by urology at [facility].  Patient had Foley catheter placed.  Patient was continued on his home alfluzosn.  Started on finasteride 5 mg p.o. daily.  Will need outpatient follow-up with urology for voiding trial versus further treatment if needed.  Renal lesion Renal ultrasound with 9 mm echogenic mass superior pole right kidney with recommendation to follow-up renal ultrasound in 6 months.  Outpatient follow-up with urology/PCP.  Hypokalemia Hypomagnesemia Repleted during hospitalization.  Recommend repeat BMP 1 week.  Essential hypertension Continue amlodipine 10 mg p.o. daily, lisinopril 20 mg p.o. daily, HCTZ 25 mg p.o. daily.  Continue aspirin and statin.  Recommend repeat BMP 1 week to ensure sodium remains within normal limits.  If has recurrent  hyponatremia, may need  to consider alternative from HCTZ.  Type 2 diabetes mellitus Continue metformin 5 mg p.o. twice daily  Hyperlipidemia Continue atorvastatin 20 mg p.o. daily  CKD stage IIIa Creatinine 1.14, stable at time of discharge.  Intellectual disability Can you risperidone 0.5 mg p.o. daily, 3 mg p.o. nightly.  Zoloft 50 mg p.o. daily, benztropine 0.5 mg p.o. daily.   Discharge Diagnoses:  Principal Problem:   Acute cystitis without hematuria Active Problems:   Acute metabolic encephalopathy   BPH with urinary obstruction   Hypertension associated with diabetes ([place])   Intellectual disability   Type 2 diabetes mellitus without complication, without long-term current use of insulin (HCC)   Hypokalemia   DNR (do not resuscitate)/DNI(Do Not Intubate)   Severe sepsis (HCC)   AKI (acute kidney injury) [facility])    Discharge Instructions  Discharge Instructions     Call MD for:  difficulty breathing, headache or visual disturbances   Complete by: As directed    Call MD for:  extreme fatigue   Complete by: As directed    Call MD for:  persistant dizziness or light-headedness   Complete by: As directed    Call MD for:  persistant nausea and vomiting   Complete by: As directed    Call MD for:  severe uncontrolled pain   Complete by: As directed    Call MD for:  temperature >100.4   Complete by: As directed    Diet - low sodium heart healthy   Complete by: As directed    Increase activity slowly   Complete by: As directed    Increase activity slowly   Complete by: As directed       Allergies as of 11/08/2022   No Known Allergies      Medication List     TAKE these medications    Accu-Chek Softclix Lancet Dev Kit by Does not apply route.   alfuzosin 10 MG 24 hr tablet Commonly known as: UROXATRAL Take 10 mg by mouth daily after breakfast.   Allergy Relief 10 MG tablet Generic drug: loratadine Take 10 mg by mouth daily.   amLODipine 10 MG  tablet Commonly known as: NORVASC Take 10 mg by mouth daily.   aspirin EC 81 MG tablet Take 81 mg by mouth daily.   atorvastatin 20 MG tablet Commonly known as: LIPITOR Take 20 mg by mouth daily.   benztropine 0.5 MG tablet Commonly known as: COGENTIN Take 0.5 mg by mouth daily.   carvedilol 12.5 MG tablet Commonly known as: COREG Take 12.5 mg by mouth 2 (two) times daily with a meal.   ferrous sulfate 325 (65 FE) MG tablet Take 325 mg by mouth every Monday, Wednesday, and Friday.   finasteride 5 MG tablet Commonly known as: Proscar Take 1 tablet (5 mg total) by mouth daily.   glucose monitoring kit monitoring kit Please dispense whichever glucometer is covered by pt's insurance with lancets and strips also; check blood sugar as directed   ketoconazole 2 % shampoo Commonly known as: NIZORAL Apply 1 Application topically 2 (two) times a week.   lisinopril-hydrochlorothiazide 20-25 MG tablet Commonly known as: ZESTORETIC Take 2 tablets by mouth daily.   metFORMIN 500 MG tablet Commonly known as: GLUCOPHAGE Take 500 mg by mouth 2 (two) times daily.   potassium chloride 10 MEQ tablet Commonly known as: KLOR-CON Take 10 mEq by mouth daily.   risperiDONE 0.5 MG tablet Commonly known as: RISPERDAL Take 0.5 mg by mouth daily.   risperiDONE 3  MG tablet Commonly known as: RISPERDAL Take 3 mg by mouth at bedtime.   sertraline 50 MG tablet Commonly known as: ZOLOFT Take 50 mg by mouth daily.        Follow-up Information     [facility] [place]. Schedule an appointment as soon as possible for a visit in 1 week(s).   Specialty: Family Medicine Contact information: [address] [place] [address] [phone]         Arma Heading, MD. Schedule an appointment as soon as possible for a visit in 1 week(s).   Specialty: Urology Contact information: [address] [place] [address] [phone]          Charlynne Pander, PA-C Follow up.   Specialty: General Surgery Why: TIME : 1:30 PM DATE : [facility] Contact information: [address] [address] [phone]                No Known Allergies  Consultations: None   Procedures/Studies: US RENAL  Result Date: 11/06/2022 CLINICAL DATA:  Hematuria EXAM: RENAL / URINARY TRACT ULTRASOUND COMPLETE COMPARISON:  CT abdomen pelvis 02/27/2012 FINDINGS: Right Kidney: Renal measurements: 11.4 x 5.6 x 5.5 cm = volume: 183 mL. Normal renal cortical thickness and echogenicity. No hydronephrosis. Multiple renal cysts including a 2.5 x 2.4 cm cyst superior pole and a 0.9 x 0.9 cm cyst superior pole. Additionally there is a 0.9 x 0.9 cm echogenic mass, too small to characterize superior pole right kidney. No imaging follow-up needed for the simple cysts. Left Kidney: Renal measurements: 12.2 x 6.2 x 4.4 cm = volume: 170.7 mL. Normal renal cortical thickness and echogenicity. No hydronephrosis. Within the superior pole there is a 3.7 x 2.8 x 2.1 cm cyst. There is an additional 3.0 x 2.8 cm cyst within the left kidney. No imaging follow-up needed for the simple cysts. Bladder: Decompressed with Foley catheter. Other: None. IMPRESSION: 1. No hydronephrosis. 2. There is a 9 mm echogenic mass within the superior pole of the right kidney, too small to characterize. 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 Consider follow-up renal ultrasound in 6 months to assess for stability. Electronically Signed   By: Lovey Newcomer M.D.   On: 11/06/2022 11:50   DG CHEST PORT 1 VIEW  Result Date: 11/06/2022 CLINICAL DATA:  Altered mental status. EXAM: PORTABLE CHEST 1 VIEW COMPARISON:  11/01/2022. FINDINGS: New small right pleural effusion with adjacent atelectasis in the right lung base. Stable cardiomegaly and mediastinal contours. No pneumothorax. IMPRESSION: New small right pleural effusion with adjacent atelectasis in the right lung base.  Electronically Signed   By: Emmit Alexanders M.D.   On: 11/06/2022 10:29   CT Head Wo Contrast  Result Date: 11/01/2022 CLINICAL DATA:  Altered mental status EXAM: CT HEAD WITHOUT CONTRAST TECHNIQUE: Contiguous axial images were obtained from the base of the skull through the vertex without intravenous contrast. RADIATION DOSE REDUCTION: This exam was performed according to the departmental dose-optimization program which includes automated exposure control, adjustment of the mA and/or kV according to patient size and/or use of iterative reconstruction technique. COMPARISON:  04/19/2020 CT head FINDINGS: Brain: No evidence of acute infarction, hemorrhage, mass, mass effect, or midline shift. No hydrocephalus or extra-axial fluid collection. Vascular: No hyperdense vessel. Skull: Negative for fracture or focal lesion. Sinuses/Orbits: Mucous retention cyst in the right maxillary sinus. Mild mucosal thickening in the maxillary sinuses and ethmoid air cells. Dysconjugate gaze. Other:  None. IMPRESSION: No acute intracranial process. Electronically Signed   By: Merilyn Baba M.D.   On: 11/01/2022 23:27   DG Chest Port 1 View  Result Date: 11/01/2022 CLINICAL DATA:  Questionable sepsis - evaluate for abnormality EXAM: PORTABLE CHEST 1 VIEW COMPARISON:  Radiograph 02/27/2012 FINDINGS: Chronic cardiomegaly. Unchanged mediastinal contours. Evidence of acute or focal airspace disease. No pulmonary edema, large pleural effusion or pneumothorax. Stable osseous structures. IMPRESSION: Chronic cardiomegaly.  No acute findings. Electronically Signed   By: Keith Rake M.D.   On: 11/01/2022 19:52     Subjective: Patient seen examined at bedside, resting comfortably.  Sitting at edge of bed.  Foley catheter placed due to recurrent urinary obstruction.  No other specific complaints or concerns at this time.  Appears his mental status is at baseline.  Completed 7-day course of antibiotics today.  Discharging back to group  home with Foley catheter in place with recommendations of outpatient follow-up with urology.  Denies headache, no chest pain, no shortness of breath, no abdominal pain.  No acute events overnight per nursing staff.  Discharge Exam: Vitals:   11/08/22 0330 11/08/22 0800  BP: (!) 140/78 130/78  Pulse: 73 80  Resp: 19 18  Temp: 97.6 F (36.4 C) 98.8 F (37.1 C)  SpO2:     Vitals:   11/08/22 0000 11/08/22 0330 11/08/22 0605 11/08/22 0800  BP: 133/78 (!) 140/78  130/78  Pulse: 75 73  80  Resp: '18 19  18  '$ Temp: 97.8 F (36.6 C) 97.6 F (36.4 C)  98.8 F (37.1 C)  TempSrc: Oral Oral  Axillary  SpO2:      Weight:   63.9 kg   Height:        Physical Exam: GEN: NAD, alert  HEENT: NCAT, PERRL, EOMI, sclera clear, MMM PULM: CTAB w/o wheezes/crackles, normal respiratory effort, on room air CV: RRR w/o M/G/R GI: abd soft, NTND, NABS, no R/G/M GU: Foley catheter noted in place, draining clear yellow urine in the collection canister MSK: no peripheral edema, moves all extremities independently    The results of significant diagnostics from this hospitalization (including imaging, microbiology, ancillary and laboratory) are listed below for reference.     Microbiology: Recent Results (from the past 240 hour(s))  Blood Culture (routine x 2)     Status: None   Collection Time: 11/01/22  7:13 PM   Specimen: BLOOD LEFT WRIST  Result Value Ref Range Status   Specimen Description BLOOD LEFT WRIST  Final   Special Requests   Final    BOTTLES DRAWN AEROBIC AND ANAEROBIC Blood Culture results may not be optimal due to an excessive volume of blood received in culture bottles   Culture   Final    NO GROWTH 5 DAYS Performed at [facility], [place] [address], [place], [place] [address]    Report Status 11/06/2022 FINAL  Final  MRSA Next Gen by PCR, Nasal     Status: None   Collection Time: 11/01/22  7:27 PM   Specimen: Nasal Mucosa; Nasal Swab  Result Value Ref Range Status   MRSA  by PCR Next Gen NOT DETECTED NOT DETECTED Final    Comment: (NOTE) The GeneXpert MRSA Assay (FDA approved for NASAL specimens only), is one component of a comprehensive MRSA colonization surveillance program. It is not intended to diagnose MRSA infection nor to guide or monitor treatment for MRSA infections. Test performance is not FDA approved in patients less than 50 years old. Performed  at [facility], [place] [address] [place], [place] [address]   Blood Culture (routine x 2)     Status: None   Collection Time: 11/01/22  7:39 PM   Specimen: BLOOD  Result Value Ref Range Status   Specimen Description BLOOD BLOOD RIGHT FOREARM  Final   Special Requests   Final    BOTTLES DRAWN AEROBIC AND ANAEROBIC Blood Culture results may not be optimal due to an excessive volume of blood received in culture bottles   Culture   Final    NO GROWTH 5 DAYS Performed at [facility], [place] [address], [place], [place] [address]    Report Status 11/06/2022 FINAL  Final  Resp panel by RT-PCR (RSV, Flu A&B, Covid) Anterior Nasal Swab     Status: None   Collection Time: 11/01/22  8:12 PM   Specimen: Anterior Nasal Swab  Result Value Ref Range Status   SARS Coronavirus 2 by RT PCR NEGATIVE NEGATIVE Final   Influenza A by PCR NEGATIVE NEGATIVE Final   Influenza B by PCR NEGATIVE NEGATIVE Final    Comment: (NOTE) The Xpert Xpress SARS-CoV-2/FLU/RSV plus assay is intended as an aid in the diagnosis of influenza from Nasopharyngeal swab specimens and should not be used as a sole basis for treatment. Nasal washings and aspirates are unacceptable for Xpert Xpress SARS-CoV-2/FLU/RSV testing.  Fact Sheet for Patients: EntrepreneurPulse.com.au  Fact Sheet for Healthcare Providers: IncredibleEmployment.be  This test is not yet approved or cleared by the [place] FDA and has been authorized for detection and/or diagnosis of SARS-CoV-2 by FDA under an  Emergency Use Authorization (EUA). This EUA will remain in effect (meaning this test can be used) for the duration of the COVID-19 declaration under Section 564(b)(1) of the Act, 21 U.S.C. section 360bbb-3(b)(1), unless the authorization is terminated or revoked.     Resp Syncytial Virus by PCR NEGATIVE NEGATIVE Final    Comment: (NOTE) Fact Sheet for Patients: EntrepreneurPulse.com.au  Fact Sheet for Healthcare Providers: IncredibleEmployment.be  This test is not yet approved or cleared by the [place] FDA and has been authorized for detection and/or diagnosis of SARS-CoV-2 by FDA under an Emergency Use Authorization (EUA). This EUA will remain in effect (meaning this test can be used) for the duration of the COVID-19 declaration under Section 564(b)(1) of the Act, 21 U.S.C. section 360bbb-3(b)(1), unless the authorization is terminated or revoked.  Performed at [facility], [place] [address], [place],  [address]      Labs: BNP (last 3 results) No results for input(s): "BNP" in the last 8760 hours. Basic Metabolic Panel: Recent Labs  Lab 11/02/22 0620 11/03/22 0610 11/04/22 0834 11/06/22 0558 11/07/22 0745  NA 143 144 144 148* 146*  K 2.9* 3.6 3.2* 3.3* 3.3*  CL 109 113* 113* 118* 113*  CO2 '24 22 23 '$ 21* 24  GLUCOSE 161* 162* 158* 114* 122*  BUN 24* 36* 36* 28* 18  CREATININE 1.34* 1.98* 2.03* 1.48* 1.14  CALCIUM 8.6* 8.7* 8.3* 8.8* 8.7*  MG 1.7  --   --  2.1 1.7  PHOS  --   --   --  3.4  --    Liver Function Tests: Recent Labs  Lab 11/01/22 1913 11/02/22 0620 11/06/22 0558  AST 24 41  --   ALT 22 31  --   ALKPHOS 53 56  --   BILITOT 0.8 1.1  --   PROT 6.5 6.4*  --   ALBUMIN 3.2*  3.0* 2.6*   No results for input(s): "LIPASE", "AMYLASE" in the last 168 hours. No results for input(s): "AMMONIA" in the last 168 hours. CBC: Recent Labs  Lab 11/01/22 1913 11/02/22 0620 11/03/22 0610 11/04/22 0834  11/06/22 0558  WBC 14.8* 15.8* 12.2* 9.5 7.9  NEUTROABS 12.8* 14.2* 10.7* 7.9* 5.7  HGB 11.3* 11.8* 10.8* 11.6* 10.7*  HCT 34.5* 36.9* 33.9* 35.7* 32.8*  MCV 91.8 93.9 91.9 91.8 91.9  PLT 120* 134* 136* 167 184   Cardiac Enzymes: No results for input(s): "CKTOTAL", "CKMB", "CKMBINDEX", "TROPONINI" in the last 168 hours. BNP: Invalid input(s): "POCBNP" CBG: Recent Labs  Lab 11/07/22 1229 11/07/22 1624 11/07/22 1701 11/07/22 2101 11/08/22 0816  GLUCAP 162* 52* 95 246* 178*   D-Dimer No results for input(s): "DDIMER" in the last 72 hours. Hgb A1c No results for input(s): "HGBA1C" in the last 72 hours. Lipid Profile No results for input(s): "CHOL", "HDL", "LDLCALC", "TRIG", "CHOLHDL", "LDLDIRECT" in the last 72 hours. Thyroid function studies No results for input(s): "TSH", "T4TOTAL", "T3FREE", "THYROIDAB" in the last 72 hours.  Invalid input(s): "FREET3" Anemia work up No results for input(s): "VITAMINB12", "FOLATE", "FERRITIN", "TIBC", "IRON", "RETICCTPCT" in the last 72 hours. Urinalysis    Component Value Date/Time   COLORURINE STRAW (A) 11/06/2022 0644   APPEARANCEUR CLEAR 11/06/2022 0644   LABSPEC 1.005 11/06/2022 0644   PHURINE 5.0 11/06/2022 0644   GLUCOSEU NEGATIVE 11/06/2022 0644   HGBUR LARGE (A) 11/06/2022 0644   BILIRUBINUR NEGATIVE 11/06/2022 0644   KETONESUR NEGATIVE 11/06/2022 0644   PROTEINUR 30 (A) 11/06/2022 0644   UROBILINOGEN 1.0 02/27/2012 1053   NITRITE NEGATIVE 11/06/2022 0644   LEUKOCYTESUR NEGATIVE 11/06/2022 0644   Sepsis Labs Recent Labs  Lab 11/02/22 0620 11/03/22 0610 11/04/22 0834 11/06/22 0558  WBC 15.8* 12.2* 9.5 7.9   Microbiology Recent Results (from the past 240 hour(s))  Blood Culture (routine x 2)     Status: None   Collection Time: 11/01/22  7:13 PM   Specimen: BLOOD LEFT WRIST  Result Value Ref Range Status   Specimen Description BLOOD LEFT WRIST  Final   Special Requests   Final    BOTTLES DRAWN AEROBIC AND  ANAEROBIC Blood Culture results may not be optimal due to an excessive volume of blood received in culture bottles   Culture   Final    NO GROWTH 5 DAYS Performed at [facility], [place] [address], [place], [place] [address]    Report Status 11/06/2022 FINAL  Final  MRSA Next Gen by PCR, Nasal     Status: None   Collection Time: 11/01/22  7:27 PM   Specimen: Nasal Mucosa; Nasal Swab  Result Value Ref Range Status   MRSA by PCR Next Gen NOT DETECTED NOT DETECTED Final    Comment: (NOTE) The GeneXpert MRSA Assay (FDA approved for NASAL specimens only), is one component of a comprehensive MRSA colonization surveillance program. It is not intended to diagnose MRSA infection nor to guide or monitor treatment for MRSA infections. Test performance is not FDA approved in patients less than 28 years old. Performed at [facility], [place] [address], [place], [place] [address]   Blood Culture (routine x 2)     Status: None   Collection Time: 11/01/22  7:39 PM   Specimen: BLOOD  Result Value Ref Range Status   Specimen Description BLOOD BLOOD RIGHT FOREARM  Final   Special Requests   Final    BOTTLES DRAWN AEROBIC AND ANAEROBIC  Blood Culture results may not be optimal due to an excessive volume of blood received in culture bottles   Culture   Final    NO GROWTH 5 DAYS Performed at [place] [address], [place], [place] [address]    Report Status 11/06/2022 FINAL  Final  Resp panel by RT-PCR (RSV, Flu A&B, Covid) Anterior Nasal Swab     Status: None   Collection Time: 11/01/22  8:12 PM   Specimen: Anterior Nasal Swab  Result Value Ref Range Status   SARS Coronavirus 2 by RT PCR NEGATIVE NEGATIVE Final   Influenza A by PCR NEGATIVE NEGATIVE Final   Influenza B by PCR NEGATIVE NEGATIVE Final    Comment: (NOTE) The Xpert Xpress SARS-CoV-2/FLU/RSV plus assay is intended as an aid in the diagnosis of influenza from Nasopharyngeal swab specimens and should not be used as  a sole basis for treatment. Nasal washings and aspirates are unacceptable for Xpert Xpress SARS-CoV-2/FLU/RSV testing.  Fact Sheet for Patients: EntrepreneurPulse.com.au  Fact Sheet for Healthcare Providers: IncredibleEmployment.be  This test is not yet approved or cleared by the [place] FDA and has been authorized for detection and/or diagnosis of SARS-CoV-2 by FDA under an Emergency Use Authorization (EUA). This EUA will remain in effect (meaning this test can be used) for the duration of the COVID-19 declaration under Section 564(b)(1) of the Act, 21 U.S.C. section 360bbb-3(b)(1), unless the authorization is terminated or revoked.     Resp Syncytial Virus by PCR NEGATIVE NEGATIVE Final    Comment: (NOTE) Fact Sheet for Patients: EntrepreneurPulse.com.au  Fact Sheet for Healthcare Providers: IncredibleEmployment.be  This test is not yet approved or cleared by the [place] FDA and has been authorized for detection and/or diagnosis of SARS-CoV-2 by FDA under an Emergency Use Authorization (EUA). This EUA will remain in effect (meaning this test can be used) for the duration of the COVID-19 declaration under Section 564(b)(1) of the Act, 21 U.S.C. section 360bbb-3(b)(1), unless the authorization is terminated or revoked.  Performed at [facility], [place] [address], [place], [place] [address]      Time coordinating discharge: Over 30 minutes  SIGNED:   Sostenes Kauffmann J [place] DO  [facility] 11/08/2022, 10:14 AM

## 2022-11-07 NOTE — TOC Transition Note (Signed)
Transition of Care [facility]) - CM/SW Discharge Note   Patient Details  Name: Eathin Kirkby MRN: [id number] Date of Birth: Jun 13, 1951  [facility] CM/SW Contact:  Levonne Lapping, RN Phone Number: 11/07/2022, 3:02 PM   Clinical Narrative:     CM called and left VM for Sister (Legal Guardian) to call back.  Will need choice for [facility] for post hospitalization and foley care    TOC will continue to follow patient for any additional discharge needs            Patient Goals and CMS Choice      Discharge Placement                         Discharge Plan and Services Additional resources added to the After Visit Summary for                                       Social Determinants of Health (SDOH) Interventions SDOH Screenings   Tobacco Use: Medium Risk (11/01/2022)     Readmission Risk Interventions     No data to display

## 2022-11-07 NOTE — Care Management (Signed)
Case Manager acknowledges "Consult to Case Management" for Home Health needs

## 2022-11-07 NOTE — Evaluation (Signed)
Physical Therapy Evaluation Patient Details Name: Fred [name] MRN: [id number] DOB: [birth year] Today's Date: 11/07/2022  History of Present Illness  72 y.o. male presents to [facility] hospital on 11/01/2022 with AMS, found to have UTI. PMH includes HTN, CKD III, DMII, BPH, intellectual disability.  Clinical Impression  Pt presents to PT with deficits in gait, balance, cognition, endurance, however based on report from listed contact Wonda Horner, the patient may be close to his baseline. Pt is able to ambulate for household distances, often with short shuffling steps in a duck-like waddle. Pt does not lose balance during session, PT providing assistance for safety and direction. Pt will benefit from frequent mobilization in an effort to improve endurance and to further reduce falls risk. PT recommends discharge home to group home when medically ready.       Recommendations for follow up therapy are one component of a multi-disciplinary discharge planning process, led by the attending physician.  Recommendations may be updated based on patient status, additional functional criteria and insurance authorization.  Follow Up Recommendations No PT follow up      Assistance Recommended at Discharge Frequent or constant Supervision/Assistance  Patient can return home with the following  A little help with bathing/dressing/bathroom;Assistance with cooking/housework;Assistance with feeding;Direct supervision/assist for medications management;Direct supervision/assist for financial management;Assist for transportation;Help with stairs or ramp for entrance    Equipment Recommendations None recommended by PT  Recommendations for Other Services       Functional Status Assessment Patient has had a recent decline in their functional status and demonstrates the ability to make significant improvements in function in a reasonable and predictable amount of time.     Precautions / Restrictions  Precautions Precautions: Fall Restrictions Weight Bearing Restrictions: No      Mobility  Bed Mobility Overal bed mobility: Modified Independent             General bed mobility comments: increased time    Transfers Overall transfer level: Needs assistance Equipment used: None Transfers: Sit to/from Stand Sit to Stand: Min guard           General transfer comment: minG for safety    Ambulation/Gait Ambulation/Gait assistance: Min guard Gait Distance (Feet): 200 Feet Assistive device: None Gait Pattern/deviations: Step-to pattern, Wide base of support, Shuffle Gait velocity: reduced Gait velocity interpretation: <1.8 ft/sec, indicate of risk for recurrent falls   General Gait Details: pt with short shuffling steps with widened BOS, waddling appearance  Stairs            Wheelchair Mobility    Modified Rankin (Stroke Patients Only)       Balance Overall balance assessment: Needs assistance Sitting-balance support: No upper extremity supported, Feet supported Sitting balance-Leahy Scale: Good     Standing balance support: No upper extremity supported, During functional activity Standing balance-Leahy Scale: Fair                               Pertinent Vitals/Pain Pain Assessment Pain Assessment: No/denies pain    Home Living Family/patient expects to be discharged to:: Group home                        Prior Function Prior Level of Function : Needs assist             Mobility Comments: ambulatory without DME, requires intermittent assistance for ADLs       Hand Dominance  Extremity/Trunk Assessment   Upper Extremity Assessment Upper Extremity Assessment: Overall WFL for tasks assessed    Lower Extremity Assessment Lower Extremity Assessment: Overall WFL for tasks assessed (difficult to formally assess due to cognitive deficits)    Cervical / Trunk Assessment Cervical / Trunk Assessment: Normal   Communication   Communication: Expressive difficulties;Receptive difficulties (expressive deficits appear more significant than receptive)  Cognition Arousal/Alertness: Awake/alert Behavior During Therapy: WFL for tasks assessed/performed Overall Cognitive Status: History of cognitive impairments - at baseline                                 General Comments: intellectual disability at baseline, pt inconsistently answers yes/no questions, is able to state his name.        General Comments General comments (skin integrity, edema, etc.): VSS on RA, sats at 91% when ambulating, difficult to assess symptoms due to expressive deficits although pt does report "no" when asked if he is tired/short of breath    Exercises     Assessment/Plan    PT Assessment Patient needs continued PT services  PT Problem List Decreased strength;Decreased activity tolerance;Decreased balance;Decreased mobility;Decreased cognition;Decreased safety awareness;Decreased knowledge of precautions       PT Treatment Interventions DME instruction;Gait training;Functional mobility training;Therapeutic activities;Therapeutic exercise;Stair training;Balance training;Neuromuscular re-education;Patient/family education    PT Goals (Current goals can be found in the Care Plan section)  Acute Rehab PT Goals Patient Stated Goal: patient unable to state, PT goal to return to group home PT Goal Formulation: Patient unable to participate in goal setting Time For Goal Achievement: 11/21/22 Potential to Achieve Goals: Good Additional Goals Additional Goal #1: Pt will score >19/24 on the DGI to indicate a reduced risk for falls    Frequency Min 3X/week     Co-evaluation               AM-PAC PT "6 Clicks" Mobility  Outcome Measure Help needed turning from your back to your side while in a flat bed without using bedrails?: None Help needed moving from lying on your back to sitting on the side of a  flat bed without using bedrails?: None Help needed moving to and from a bed to a chair (including a wheelchair)?: A Little Help needed standing up from a chair using your arms (e.g., wheelchair or bedside chair)?: A Little Help needed to walk in hospital room?: A Little Help needed climbing 3-5 steps with a railing? : A Lot 6 Click Score: 19    End of Session Equipment Utilized During Treatment: Gait belt Activity Tolerance: Patient tolerated treatment well Patient left: in bed;with call bell/phone within reach;with bed alarm set Nurse Communication: Mobility status PT Visit Diagnosis: Other abnormalities of gait and mobility (R26.89)    Time: [id number] PT Time Calculation (min) (ACUTE ONLY): 17 min   Charges:   PT Evaluation $PT Eval Low Complexity: [place], PT, DPT Acute Rehabilitation Office [phone]   Zenaida Niece 11/07/2022, 1:08 PM

## 2022-11-07 NOTE — TOC Progression Note (Addendum)
Transition of Care [facility]) - Progression Note    Patient Details  Name: Fred [name] MRN: [id number] Date of Birth: 08/05/1951  [facility]) CM/SW Contact  Levonne Lapping, RN Phone Number: 11/07/2022, 2:05 PM  Clinical Narrative:     UPDATE 2:59 PM Group home returned call. They will check with their RN to see if they can accept patient re:  Foley care.  HHSN has been ordered and accepted by [facility]  Urology appointment to be scheduled prior to DC   CM called group home and spoke with Manager Wonda Horner [phone].  Patient will need to dc with Foley until urology follow up. Manager stated he will call his boss to see if they can accept patient back to group home with a FC. CM did state that [facility] will be set up to assist with foley care. Waiting to see if they can accept patient back to group home .    Expected Discharge Plan and Services                                               Social Determinants of Health (SDOH) Interventions SDOH Screenings   Tobacco Use: Medium Risk (11/01/2022)    Readmission Risk Interventions     No data to display

## 2022-11-07 NOTE — Progress Notes (Signed)
Mobility Specialist Progress Note   11/07/22 1501  Mobility  Activity Ambulated with assistance in hallway  Level of Assistance Contact guard assist, steadying assist  Assistive Device Front wheel walker  Distance Ambulated (ft) 180 ft  Activity Response Tolerated well  Mobility Referral Yes  $Mobility charge 1 Mobility   Session limited to deficits in pts cognition and gait. Received pt in bed inclined to ambulating but unable to articulate there status. Able to get EOB and stand w/ minG and used minA during ambulation for safety, cues give throughout. Returned back to room w/o fault, left in bed w/ call bell in reach and bed alarm on.    Holland Falling Mobility Specialist Please contact via SecureChat or  Rehab office at [phone]

## 2022-11-07 NOTE — Progress Notes (Signed)
Discharge delayed due to pending group home acceptance per Case Management.

## 2022-11-08 DIAGNOSIS — N3000 Acute cystitis without hematuria: Secondary | ICD-10-CM | POA: Diagnosis not present

## 2022-11-08 LAB — GLUCOSE, CAPILLARY
Glucose-Capillary: 178 mg/dL — ABNORMAL HIGH (ref 70–99)
Glucose-Capillary: 121 mg/dL — ABNORMAL HIGH (ref 70–99)
Glucose-Capillary: 61 mg/dL — ABNORMAL LOW (ref 70–99)
Glucose-Capillary: 326 mg/dL — ABNORMAL HIGH (ref 70–99)
Glucose-Capillary: 274 mg/dL — ABNORMAL HIGH (ref 70–99)

## 2022-11-08 MED ORDER — FINASTERIDE 5 MG PO TABS
5.0000 mg | ORAL_TABLET | Freq: Every day | ORAL | Status: DC
  Administered 2022-11-08 – 2022-11-22 (×15): 5 mg via ORAL
  Filled 2022-11-08 (×15): qty 1

## 2022-11-08 NOTE — Progress Notes (Signed)
PROGRESS NOTE    Fred [name]  [id number] DOB: 1951/08/08 DOA: 11/01/2022 PCP: [facility]    Brief Narrative:   Fred [name] is a 72 year old male with past medical history significant for intellectual disability, HTN, CKD stage IIIa, type 2 diabetes mellitus, BPH with urinary obstruction who presented to [facility] ED on 3/5 with sudden onset mental status change/confusion.  Patient typically goes to adult daycare and currently resides in a group home.  Patient reportedly suddenly slumped over while having dinner and was slow to answer questions.  No reports of fever, chills, nausea/vomiting.   In the ED, temperature 100.1 F rectally, BP 177/74, HR 107, RR 23, SpO2 100% on room air.  Sodium 140, potassium 3.1, chloride 103, CO2 25, glucose 181, BUN 24, creatinine 1.34.  AST 24, ALT 22, total bilirubin 0.8.  Lactic acid 1.4.  WBC 14.8, hemoglobin 11.3, platelet count 120. COVID-19/influenza/RSV PCR negative.  Urinalysis with large leukocytes, negative nitrite, few bacteria, 6-10 WBCs.  Chest x-ray with small right pleural effusion with adjacent atelectasis right lung base.  CT head without contrast with no acute intracranial abnormality.  EKG with normal sinus rhythm.  EDP started on LR infusion, vancomycin/cefepime-metronidazole, given potassium replacement.  TRH consulted for admission for further evaluation management of acute metabolic encephalopathy, concern for UTI.  Assessment & Plan:   Acute metabolic encephalopathy Urinary tract infection Patient presenting to ED from a group home after being found confused.  Patient with slightly elevated temperature of 100.1 F rectally, elevated WBC count of 14.8.  COVID/influenza/RSV PCR negative.  Urinalysis with large leukocytes, negative nitrite, few bacteria, and and 6-10 WBCs.  Chest x-ray negative for focal consolidation.  No urine culture was performed.  Blood cultures x 2 showed no growth x 5 days.  MRSA PCR  negative.  Patient was initially started on vancomycin, cefepime, metronidazole and transition to ceftriaxone in which he completed a total course of antibiotics of 7 days.  Patient's confusion has now resolved and appears to be at his typical baseline.  Outpatient follow-up with PCP.   Obstructive uropathy BPH Patient during his hospitalization with several episodes of urinary retention required In-N-Out catheterizations.  Patient has a history of BPH, followed by urology at [facility] health.  Patient had Foley catheter placed. Patient was continued on his home alfluzosin.  Started on finasteride 5 mg p.o. daily.  Will need outpatient follow-up with urology for voiding trial versus further treatment if needed.   Renal lesion Renal ultrasound with 9 mm echogenic mass superior pole right kidney with recommendation to follow-up renal ultrasound in 6 months.  Outpatient follow-up with urology/PCP.   Hypokalemia Hypomagnesemia Repleted during hospitalization.  Recommend repeat BMP 1 week.   Essential hypertension Continue amlodipine 10 mg p.o. daily, lisinopril 20 mg p.o. daily, HCTZ 25 mg p.o. daily.  Continue aspirin and statin.  Recommend repeat BMP 1 week to ensure sodium remains within normal limits.  If has recurrent hyponatremia, may need to consider alternative from HCTZ.   Type 2 diabetes mellitus Continue metformin 5 mg p.o. twice daily   Hyperlipidemia Continue atorvastatin 20 mg p.o. daily   CKD stage IIIa Creatinine 1.14, stable at time of discharge.   Intellectual disability Can you risperidone 0.5 mg p.o. daily, 3 mg p.o. nightly.  Zoloft 50 mg p.o. daily, benztropine 0.5 mg p.o. daily.   DVT prophylaxis: heparin injection 5,000 Units Start: 11/02/22 0600 SCDs Start: 11/02/22 0551    Code Status: DNR Family Communication:  No family present at bedside  Disposition Plan:  Level of care: Med-Surg Status is: Inpatient Remains inpatient appropriate because:  Medically stable for discharge back to group home, discharge delayed due to awaiting acceptance from group home now that patient has Foley catheter in place.    Consultants:  None  Procedures:  Foley catheter  Antimicrobials:  Vancomycin 3/5 - 3/5 Cefepime 3/5 - 3/5 3/5 - 3/5 Ceftriaxone 3/6 - 3/11   Subjective: Patient seen and examined at bedside, resting comfortably.  Sleeping but easy arousable.  Plan discharge yesterday, held due to awaiting approval from group home given patient now has Foley catheter in place.  Home health RN has been set up.  Medically stable for discharge once TOC and group home come to agreement.  Denies headache, no chest pain, no shortness of breath, no abdominal pain.  No acute events overnight per nursing staff.  Objective: Vitals:   11/08/22 0000 11/08/22 0330 11/08/22 0605 11/08/22 0800  BP: 133/78 (!) 140/78  130/78  Pulse: 75 73  80  Resp: '18 19  18  '$ Temp: 97.8 F (36.6 C) 97.6 F (36.4 C)  98.8 F (37.1 C)  TempSrc: Oral Oral  Axillary  SpO2:      Weight:   63.9 kg   Height:        Intake/Output Summary (Last 24 hours) at 11/08/2022 1009 Last data filed at 11/08/2022 0941 Gross per 24 hour  Intake 50 ml  Output 3800 ml  Net -3750 ml   Filed Weights   11/06/22 0500 11/07/22 0500 11/08/22 0605  Weight: 63.1 kg 64.4 kg 63.9 kg    Examination:  Physical Exam: GEN: NAD, alert HEENT: NCAT, PERRL, EOMI, sclera clear, MMM PULM: CTAB w/o wheezes/crackles, normal respiratory effort, on room air CV: RRR w/o M/G/R GI: abd soft, NTND, NABS, no R/G/M GU: Foley catheter noted in place, draining clear yellow urine in the collection canister MSK: no peripheral edema, moves all extremities independently    Data Reviewed: I have personally reviewed following labs and imaging studies  CBC: Recent Labs  Lab 11/01/22 1913 11/02/22 0620 11/03/22 0610 11/04/22 0834 11/06/22 0558  WBC 14.8* 15.8* 12.2* 9.5 7.9  NEUTROABS 12.8* 14.2*  10.7* 7.9* 5.7  HGB 11.3* 11.8* 10.8* 11.6* 10.7*  HCT 34.5* 36.9* 33.9* 35.7* 32.8*  MCV 91.8 93.9 91.9 91.8 91.9  PLT 120* 134* 136* 167 [id number]   Basic Metabolic Panel: Recent Labs  Lab 11/02/22 0620 11/03/22 0610 11/04/22 0834 11/06/22 0558 11/07/22 0745  NA 143 144 144 148* 146*  K 2.9* 3.6 3.2* 3.3* 3.3*  CL 109 113* 113* 118* 113*  CO2 '24 22 23 '$ 21* 24  GLUCOSE 161* 162* 158* 114* 122*  BUN 24* 36* 36* 28* 18  CREATININE 1.34* 1.98* 2.03* 1.48* 1.14  CALCIUM 8.6* 8.7* 8.3* 8.8* 8.7*  MG 1.7  --   --  2.1 1.7  PHOS  --   --   --  3.4  --    GFR: Estimated Creatinine Clearance: 52.9 mL/min (by C-G formula based on SCr of 1.14 mg/dL). Liver Function Tests: Recent Labs  Lab 11/01/22 1913 11/02/22 0620 11/06/22 0558  AST 24 41  --   ALT 22 31  --   ALKPHOS 53 56  --   BILITOT 0.8 1.1  --   PROT 6.5 6.4*  --   ALBUMIN 3.2* 3.0* 2.6*   No results for input(s): "LIPASE", "AMYLASE" in the last 168 hours. No results for input(s): "AMMONIA"  in the last 168 hours. Coagulation Profile: Recent Labs  Lab 11/01/22 1913  INR 1.4*   Cardiac Enzymes: No results for input(s): "CKTOTAL", "CKMB", "CKMBINDEX", "TROPONINI" in the last 168 hours. BNP (last 3 results) No results for input(s): "PROBNP" in the last 8760 hours. HbA1C: No results for input(s): "HGBA1C" in the last 72 hours. CBG: Recent Labs  Lab 11/07/22 1229 11/07/22 1624 11/07/22 1701 11/07/22 2101 11/08/22 0816  GLUCAP 162* 52* 95 246* 178*   Lipid Profile: No results for input(s): "CHOL", "HDL", "LDLCALC", "TRIG", "CHOLHDL", "LDLDIRECT" in the last 72 hours. Thyroid Function Tests: No results for input(s): "TSH", "T4TOTAL", "FREET4", "T3FREE", "THYROIDAB" in the last 72 hours. Anemia Panel: No results for input(s): "VITAMINB12", "FOLATE", "FERRITIN", "TIBC", "IRON", "RETICCTPCT" in the last 72 hours. Sepsis Labs: Recent Labs  Lab 11/01/22 1913 11/01/22 2146  LATICACIDVEN 1.4 1.5    Recent Results  (from the past 240 hour(s))  Blood Culture (routine x 2)     Status: None   Collection Time: 11/01/22  7:13 PM   Specimen: BLOOD LEFT WRIST  Result Value Ref Range Status   Specimen Description BLOOD LEFT WRIST  Final   Special Requests   Final    BOTTLES DRAWN AEROBIC AND ANAEROBIC Blood Culture results may not be optimal due to an excessive volume of blood received in culture bottles   Culture   Final    NO GROWTH 5 DAYS Performed at [facility], [place] [address], [place], [place] [address]    Report Status 11/06/2022 FINAL  Final  MRSA Next Gen by PCR, Nasal     Status: None   Collection Time: 11/01/22  7:27 PM   Specimen: Nasal Mucosa; Nasal Swab  Result Value Ref Range Status   MRSA by PCR Next Gen NOT DETECTED NOT DETECTED Final    Comment: (NOTE) The GeneXpert MRSA Assay (FDA approved for NASAL specimens only), is one component of a comprehensive MRSA colonization surveillance program. It is not intended to diagnose MRSA infection nor to guide or monitor treatment for MRSA infections. Test performance is not FDA approved in patients less than 22 years old. Performed at [facility], [place] [address], [place], [place] [address]   Blood Culture (routine x 2)     Status: None   Collection Time: 11/01/22  7:39 PM   Specimen: BLOOD  Result Value Ref Range Status   Specimen Description BLOOD BLOOD RIGHT FOREARM  Final   Special Requests   Final    BOTTLES DRAWN AEROBIC AND ANAEROBIC Blood Culture results may not be optimal due to an excessive volume of blood received in culture bottles   Culture   Final    NO GROWTH 5 DAYS Performed at [facility], [place] [address], [place], [place] [address]    Report Status 11/06/2022 FINAL  Final  Resp panel by RT-PCR (RSV, Flu A&B, Covid) Anterior Nasal Swab     Status: None   Collection Time: 11/01/22  8:12 PM   Specimen: Anterior Nasal Swab  Result Value Ref Range Status   SARS Coronavirus 2 by RT PCR NEGATIVE NEGATIVE  Final   Influenza A by PCR NEGATIVE NEGATIVE Final   Influenza B by PCR NEGATIVE NEGATIVE Final    Comment: (NOTE) The Xpert Xpress SARS-CoV-2/FLU/RSV plus assay is intended as an aid in the diagnosis of influenza from Nasopharyngeal swab specimens and should not be used as a sole basis for treatment. Nasal washings and aspirates are unacceptable  for Xpert Xpress SARS-CoV-2/FLU/RSV testing.  Fact Sheet for Patients: EntrepreneurPulse.com.au  Fact Sheet for Healthcare Providers: IncredibleEmployment.be  This test is not yet approved or cleared by the [place] FDA and has been authorized for detection and/or diagnosis of SARS-CoV-2 by FDA under an Emergency Use Authorization (EUA). This EUA will remain in effect (meaning this test can be used) for the duration of the COVID-19 declaration under Section 564(b)(1) of the Act, 21 U.S.C. section 360bbb-3(b)(1), unless the authorization is terminated or revoked.     Resp Syncytial Virus by PCR NEGATIVE NEGATIVE Final    Comment: (NOTE) Fact Sheet for Patients: EntrepreneurPulse.com.au  Fact Sheet for Healthcare Providers: IncredibleEmployment.be  This test is not yet approved or cleared by the [place] FDA and has been authorized for detection and/or diagnosis of SARS-CoV-2 by FDA under an Emergency Use Authorization (EUA). This EUA will remain in effect (meaning this test can be used) for the duration of the COVID-19 declaration under Section 564(b)(1) of the Act, 21 U.S.C. section 360bbb-3(b)(1), unless the authorization is terminated or revoked.  Performed at [facility], [place] [address] [place], [place] [address]          Radiology Studies: US RENAL  Result Date: 11/06/2022 CLINICAL DATA:  Hematuria EXAM: RENAL / URINARY TRACT ULTRASOUND COMPLETE COMPARISON:  CT abdomen pelvis 02/27/2012 FINDINGS: Right Kidney: Renal measurements:  11.4 x 5.6 x 5.5 cm = volume: 183 mL. Normal renal cortical thickness and echogenicity. No hydronephrosis. Multiple renal cysts including a 2.5 x 2.4 cm cyst superior pole and a 0.9 x 0.9 cm cyst superior pole. Additionally there is a 0.9 x 0.9 cm echogenic mass, too small to characterize superior pole right kidney. No imaging follow-up needed for the simple cysts. Left Kidney: Renal measurements: 12.2 x 6.2 x 4.4 cm = volume: 170.7 mL. Normal renal cortical thickness and echogenicity. No hydronephrosis. Within the superior pole there is a 3.7 x 2.8 x 2.1 cm cyst. There is an additional 3.0 x 2.8 cm cyst within the left kidney. No imaging follow-up needed for the simple cysts. Bladder: Decompressed with Foley catheter. Other: None. IMPRESSION: 1. No hydronephrosis. 2. There is a 9 mm echogenic mass within the superior pole of the right kidney, too small to characterize. 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 Consider follow-up renal ultrasound in 6 months to assess for stability. Electronically Signed   By: Lovey Newcomer M.D.   On: 11/06/2022 11:50   DG CHEST PORT 1 VIEW  Result Date: 11/06/2022 CLINICAL DATA:  Altered mental status. EXAM: PORTABLE CHEST 1 VIEW COMPARISON:  11/01/2022. FINDINGS: New small right pleural effusion with adjacent atelectasis in the right lung base. Stable cardiomegaly and mediastinal contours. No pneumothorax. IMPRESSION: New small right pleural effusion with adjacent atelectasis in the right lung base. Electronically Signed   By: Emmit Alexanders M.D.   On: 11/06/2022 10:29        Scheduled Meds:  alfuzosin  10 mg Oral QPC breakfast   amLODipine  10 mg Oral Daily   aspirin EC  81 mg Oral Daily   atorvastatin  20 mg Oral Daily   benztropine  0.5 mg Oral Daily   carvedilol  12.5 mg Oral BID WC   Chlorhexidine Gluconate Cloth  6 each Topical Daily   heparin  5,000 Units Subcutaneous Q8H   insulin aspart  0-5 Units Subcutaneous QHS   insulin aspart  0-9 Units  Subcutaneous TID WC   risperiDONE  0.5 mg Oral Daily  risperiDONE  3 mg Oral QHS   sertraline  50 mg Oral Daily   tamsulosin  0.4 mg Oral QPC breakfast   Continuous Infusions:   LOS: 6 days    Time spent: 49 minutes spent on chart review, discussion with nursing staff, consultants, updating family and interview/physical exam; more than 50% of that time was spent in counseling and/or coordination of care.    Meah Jiron J [place] DO [facility] Available via Epic secure chat 7am-7pm After these hours, please refer to coverage provider listed on [email or web address] 11/08/2022, 10:09 AM

## 2022-11-08 NOTE — TOC Progression Note (Signed)
Transition of Care [facility]) - Progression Note    Patient Details  Name: Fred [name] MRN: [id number] Date of Birth: 1950-12-09  Transition [facility]) CM/SW Contact  Levonne Lapping, RN Phone Number: 11/08/2022, 11:17 AM  Clinical Narrative:     Damaris Schooner with [facility] at [place]   They cannot accept him back with catheter. Just don't have the staff to support that care.  He may return when the catheter is removed.          Expected Discharge Plan and Services         Expected Discharge Date: 11/08/22                                     Social Determinants of Health (SDOH) Interventions SDOH Screenings   Tobacco Use: Medium Risk (11/01/2022)    Readmission Risk Interventions     No data to display

## 2022-11-08 NOTE — Progress Notes (Signed)
Mobility Specialist - Progress Note   11/08/22 1149  Mobility  Activity Ambulated with assistance in hallway  Level of Assistance Contact guard assist, steadying assist  Assistive Device Front wheel walker  Distance Ambulated (ft) 180 ft  Activity Response Tolerated well  Mobility Referral Yes  $Mobility charge 1 Mobility   Pt was received in bed and agreeable. Pt demonstrated poor cognition thoughout session. Pt required verba cues throughout session. Pt MinA throughout session. Pt was returned to bed with all needs met.   Franki Monte  Mobility Specialist Please contact via Solicitor or Rehab office at [phone]

## 2022-11-08 NOTE — Progress Notes (Signed)
PROGRESS NOTE    Fred [name]  [id number] DOB: 22-May-1951 DOA: 11/01/2022 PCP: [facility]    Brief Narrative:   Fred [name] is a 72 year old male with past medical history significant for intellectual disability, HTN, CKD stage IIIa, type 2 diabetes mellitus, BPH with urinary obstruction who presented to [facility] ED on 3/5 with sudden onset mental status change/confusion.  Patient typically goes to adult daycare and currently resides in a group home.  Patient reportedly suddenly slumped over while having dinner and was slow to answer questions.  No reports of fever, chills, nausea/vomiting.   In the ED, temperature 100.1 F rectally, BP 177/74, HR 107, RR 23, SpO2 100% on room air.  Sodium 140, potassium 3.1, chloride 103, CO2 25, glucose 181, BUN 24, creatinine 1.34.  AST 24, ALT 22, total bilirubin 0.8.  Lactic acid 1.4.  WBC 14.8, hemoglobin 11.3, platelet count 120. COVID-19/influenza/RSV PCR negative.  Urinalysis with large leukocytes, negative nitrite, few bacteria, 6-10 WBCs.  Chest x-ray with small right pleural effusion with adjacent atelectasis right lung base.  CT head without contrast with no acute intracranial abnormality.  EKG with normal sinus rhythm.  EDP started on LR infusion, vancomycin/cefepime-metronidazole, given potassium replacement.  TRH consulted for admission for further evaluation management of acute metabolic encephalopathy, concern for UTI.  Assessment & Plan:   Acute metabolic encephalopathy Urinary tract infection Patient presenting to ED from a group home after being found confused.  Patient with slightly elevated temperature of 100.1 F rectally, elevated WBC count of 14.8.  COVID/influenza/RSV PCR negative.  Urinalysis with large leukocytes, negative nitrite, few bacteria, and and 6-10 WBCs.  Chest x-ray negative for focal consolidation.  No urine culture was performed.  Blood cultures x 2 showed no growth x 5 days.  MRSA PCR  negative.  Patient was initially started on vancomycin, cefepime, metronidazole and transition to ceftriaxone in which he completed a total course of antibiotics of 7 days.  Patient's confusion has now resolved and appears to be at his typical baseline.  Outpatient follow-up with PCP.   Obstructive uropathy BPH Patient during his hospitalization with several episodes of urinary retention required In-N-Out catheterizations.  Patient has a history of BPH, followed by urology at [facility].  Patient had Foley catheter placed. Patient was continued on his home alfluzosin.  Started on finasteride 5 mg p.o. daily.   -- Will need voiding trial in the next few days as patient's group home will not accept him back with a Foley catheter. 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  Outpatient follow-up with urology/PCP.   Hypokalemia Hypomagnesemia Repleted during hospitalization.     Essential hypertension -- Continue amlodipine 10 mg p.o. daily, lisinopril 20 mg p.o. daily -- Home HCTZ for hyponatremia on admission --Continue aspirin and statin   Type 2 diabetes mellitus Holding metformin while inpatient.   Hyperlipidemia Continue atorvastatin 20 mg p.o. daily   CKD stage IIIa Creatinine 1.14, stable    Intellectual disability Continue risperidone 0.5 mg p.o. daily, 3 mg p.o. nightly.  Zoloft 50 mg p.o. daily, benztropine 0.5 mg p.o. daily.   DVT prophylaxis: heparin injection 5,000 Units Start: 11/02/22 0600 SCDs Start: 11/02/22 0551    Code Status: DNR Family Communication: No family present at bedside  Disposition Plan:  Level of care: Med-Surg Status is: Inpatient Remains inpatient  appropriate because: Medically stable for discharge back to group home, discharge delayed due to group home unwilling to accept  patient back with a Foley catheter.  Will need repeat voiding trial inpatient over the next few days with hopeful plan discharge back to group home and outpatient follow-up with his primary urologist at [place].    Consultants:  None  Procedures:  Foley catheter  Antimicrobials:  Vancomycin 3/5 - 3/5 Cefepime 3/5 - 3/5 3/5 - 3/5 Ceftriaxone 3/6 - 3/11   Subjective: Patient seen and examined at bedside, resting comfortably.  Sleeping but easy arousable.  Again planned discharge today but TOC noticed that group home will not accept him back with a Foley catheter.  Will need to reattempt voiding trial inpatient now started on finasteride.  Patient with no other specific complaints or concerns at this time. Denies headache, no chest pain, no shortness of breath, no abdominal pain.  No acute events overnight per nursing staff.  Objective: Vitals:   11/08/22 0000 11/08/22 0330 11/08/22 0605 11/08/22 0800  BP: 133/78 (!) 140/78  130/78  Pulse: 75 73  80  Resp: '18 19  18  '$ Temp: 97.8 F (36.6 C) 97.6 F (36.4 C)  98.8 F (37.1 C)  TempSrc: Oral Oral  Axillary  SpO2:      Weight:   63.9 kg   Height:        Intake/Output Summary (Last 24 hours) at 11/08/2022 1150 Last data filed at 11/08/2022 0941 Gross per 24 hour  Intake 470 ml  Output 3800 ml  Net -3330 ml   Filed Weights   11/06/22 0500 11/07/22 0500 11/08/22 0605  Weight: 63.1 kg 64.4 kg 63.9 kg    Examination:  Physical Exam: GEN: NAD, alert HEENT: NCAT, PERRL, EOMI, sclera clear, MMM PULM: CTAB w/o wheezes/crackles, normal respiratory effort, on room air CV: RRR w/o M/G/R GI: abd soft, NTND, NABS, no R/G/M GU: Foley catheter noted in place, draining clear yellow urine in the collection canister MSK: no peripheral edema, moves all extremities independently    Data Reviewed: I have personally reviewed following labs and imaging studies  CBC: Recent Labs  Lab 11/01/22 1913 11/02/22 0620  11/03/22 0610 11/04/22 0834 11/06/22 0558  WBC 14.8* 15.8* 12.2* 9.5 7.9  NEUTROABS 12.8* 14.2* 10.7* 7.9* 5.7  HGB 11.3* 11.8* 10.8* 11.6* 10.7*  HCT 34.5* 36.9* 33.9* 35.7* 32.8*  MCV 91.8 93.9 91.9 91.8 91.9  PLT 120* 134* 136* 167 [id number]   Basic Metabolic Panel: Recent Labs  Lab 11/02/22 0620 11/03/22 0610 11/04/22 0834 11/06/22 0558 11/07/22 0745  NA 143 144 144 148* 146*  K 2.9* 3.6 3.2* 3.3* 3.3*  CL 109 113* 113* 118* 113*  CO2 '24 22 23 '$ 21* 24  GLUCOSE 161* 162* 158* 114* 122*  BUN 24* 36* 36* 28* 18  CREATININE 1.34* 1.98* 2.03* 1.48* 1.14  CALCIUM 8.6* 8.7* 8.3* 8.8* 8.7*  MG 1.7  --   --  2.1 1.7  PHOS  --   --   --  3.4  --    GFR: Estimated Creatinine Clearance: 52.9 mL/min (by C-G formula based on SCr of 1.14 mg/dL). Liver Function Tests: Recent Labs  Lab 11/01/22 1913 11/02/22 0620 11/06/22 0558  AST 24 41  --   ALT 22 31  --   ALKPHOS 53 56  --   BILITOT 0.8 1.1  --   PROT 6.5 6.4*  --   ALBUMIN 3.2* 3.0* 2.6*   No results  for input(s): "LIPASE", "AMYLASE" in the last 168 hours. No results for input(s): "AMMONIA" in the last 168 hours. Coagulation Profile: Recent Labs  Lab 11/01/22 1913  INR 1.4*   Cardiac Enzymes: No results for input(s): "CKTOTAL", "CKMB", "CKMBINDEX", "TROPONINI" in the last 168 hours. BNP (last 3 results) No results for input(s): "PROBNP" in the last 8760 hours. HbA1C: No results for input(s): "HGBA1C" in the last 72 hours. CBG: Recent Labs  Lab 11/07/22 1229 11/07/22 1624 11/07/22 1701 11/07/22 2101 11/08/22 0816  GLUCAP 162* 52* 95 246* 178*   Lipid Profile: No results for input(s): "CHOL", "HDL", "LDLCALC", "TRIG", "CHOLHDL", "LDLDIRECT" in the last 72 hours. Thyroid Function Tests: No results for input(s): "TSH", "T4TOTAL", "FREET4", "T3FREE", "THYROIDAB" in the last 72 hours. Anemia Panel: No results for input(s): "VITAMINB12", "FOLATE", "FERRITIN", "TIBC", "IRON", "RETICCTPCT" in the last 72  hours. Sepsis Labs: Recent Labs  Lab 11/01/22 1913 11/01/22 2146  LATICACIDVEN 1.4 1.5    Recent Results (from the past 240 hour(s))  Blood Culture (routine x 2)     Status: None   Collection Time: 11/01/22  7:13 PM   Specimen: BLOOD LEFT WRIST  Result Value Ref Range Status   Specimen Description BLOOD LEFT WRIST  Final   Special Requests   Final    BOTTLES DRAWN AEROBIC AND ANAEROBIC Blood Culture results may not be optimal due to an excessive volume of blood received in culture bottles   Culture   Final    NO GROWTH 5 DAYS Performed at [facility], [place] [address], [place], [place] [address]    Report Status 11/06/2022 FINAL  Final  MRSA Next Gen by PCR, Nasal     Status: None   Collection Time: 11/01/22  7:27 PM   Specimen: Nasal Mucosa; Nasal Swab  Result Value Ref Range Status   MRSA by PCR Next Gen NOT DETECTED NOT DETECTED Final    Comment: (NOTE) The GeneXpert MRSA Assay (FDA approved for NASAL specimens only), is one component of a comprehensive MRSA colonization surveillance program. It is not intended to diagnose MRSA infection nor to guide or monitor treatment for MRSA infections. Test performance is not FDA approved in patients less than 30 years old. Performed at [facility], [place] [address], [place], [place] [address]   Blood Culture (routine x 2)     Status: None   Collection Time: 11/01/22  7:39 PM   Specimen: BLOOD  Result Value Ref Range Status   Specimen Description BLOOD BLOOD RIGHT FOREARM  Final   Special Requests   Final    BOTTLES DRAWN AEROBIC AND ANAEROBIC Blood Culture results may not be optimal due to an excessive volume of blood received in culture bottles   Culture   Final    NO GROWTH 5 DAYS Performed at [facility], [place] [address], [place],  [address]    Report Status 11/06/2022 FINAL  Final  Resp panel by RT-PCR (RSV, Flu A&B, Covid) Anterior Nasal Swab     Status: None   Collection Time: 11/01/22  8:12 PM    Specimen: Anterior Nasal Swab  Result Value Ref Range Status   SARS Coronavirus 2 by RT PCR NEGATIVE NEGATIVE Final   Influenza A by PCR NEGATIVE NEGATIVE Final   Influenza B by PCR NEGATIVE NEGATIVE Final    Comment: (NOTE) The Xpert Xpress SARS-CoV-2/FLU/RSV plus assay is intended as an aid in the diagnosis of influenza from Nasopharyngeal swab specimens and should not  be used as a sole basis for treatment. Nasal washings and aspirates are unacceptable for Xpert Xpress SARS-CoV-2/FLU/RSV testing.  Fact Sheet for Patients: EntrepreneurPulse.com.au  Fact Sheet for Healthcare Providers: IncredibleEmployment.be  This test is not yet approved or cleared by the [place] FDA and has been authorized for detection and/or diagnosis of SARS-CoV-2 by FDA under an Emergency Use Authorization (EUA). This EUA will remain in effect (meaning this test can be used) for the duration of the COVID-19 declaration under Section 564(b)(1) of the Act, 21 U.S.C. section 360bbb-3(b)(1), unless the authorization is terminated or revoked.     Resp Syncytial Virus by PCR NEGATIVE NEGATIVE Final    Comment: (NOTE) Fact Sheet for Patients: EntrepreneurPulse.com.au  Fact Sheet for Healthcare Providers: IncredibleEmployment.be  This test is not yet approved or cleared by the [place] FDA and has been authorized for detection and/or diagnosis of SARS-CoV-2 by FDA under an Emergency Use Authorization (EUA). This EUA will remain in effect (meaning this test can be used) for the duration of the COVID-19 declaration under Section 564(b)(1) of the Act, 21 U.S.C. section 360bbb-3(b)(1), unless the authorization is terminated or revoked.  Performed at [facility], [place] [address], [place], [place] [address]          Radiology Studies: No results found.      Scheduled Meds:  alfuzosin  10 mg Oral QPC breakfast    amLODipine  10 mg Oral Daily   aspirin EC  81 mg Oral Daily   atorvastatin  20 mg Oral Daily   benztropine  0.5 mg Oral Daily   carvedilol  12.5 mg Oral BID WC   Chlorhexidine Gluconate Cloth  6 each Topical Daily   finasteride  5 mg Oral Daily   heparin  5,000 Units Subcutaneous Q8H   insulin aspart  0-5 Units Subcutaneous QHS   insulin aspart  0-9 Units Subcutaneous TID WC   risperiDONE  0.5 mg Oral Daily   risperiDONE  3 mg Oral QHS   sertraline  50 mg Oral Daily   Continuous Infusions:   LOS: 6 days    Time spent: 49 minutes spent on chart review, discussion with nursing staff, consultants, updating family and interview/physical exam; more than 50% of that time was spent in counseling and/or coordination of care.    Seana Underwood J [place] DO [facility] Available via Epic secure chat 7am-7pm After these hours, please refer to coverage provider listed on [email or web address] 11/08/2022, 11:50 AM

## 2022-11-09 DIAGNOSIS — N179 Acute kidney failure, unspecified: Secondary | ICD-10-CM

## 2022-11-09 DIAGNOSIS — N3000 Acute cystitis without hematuria: Secondary | ICD-10-CM | POA: Diagnosis not present

## 2022-11-09 DIAGNOSIS — E119 Type 2 diabetes mellitus without complications: Secondary | ICD-10-CM | POA: Diagnosis not present

## 2022-11-09 DIAGNOSIS — E1159 Type 2 diabetes mellitus with other circulatory complications: Secondary | ICD-10-CM | POA: Diagnosis not present

## 2022-11-09 LAB — GLUCOSE, CAPILLARY
Glucose-Capillary: 204 mg/dL — ABNORMAL HIGH (ref 70–99)
Glucose-Capillary: 94 mg/dL (ref 70–99)
Glucose-Capillary: 198 mg/dL — ABNORMAL HIGH (ref 70–99)
Glucose-Capillary: 288 mg/dL — ABNORMAL HIGH (ref 70–99)

## 2022-11-09 LAB — BASIC METABOLIC PANEL
Anion gap: 8 (ref 5–15)
BUN: 24 mg/dL — ABNORMAL HIGH (ref 8–23)
CO2: 24 mmol/L (ref 22–32)
Calcium: 8.4 mg/dL — ABNORMAL LOW (ref 8.9–10.3)
Chloride: 110 mmol/L (ref 98–111)
Creatinine, Ser: 0.99 mg/dL (ref 0.61–1.24)
GFR, Estimated: >60 mL/min (ref >60)
Glucose, Bld: 86 mg/dL (ref 70–99)
Potassium: 3.6 mmol/L (ref 3.5–5.1)
Sodium: 142 mmol/L (ref 135–145)

## 2022-11-09 LAB — MAGNESIUM: Magnesium: 1.8 mg/dL (ref 1.7–2.4)

## 2022-11-09 NOTE — Progress Notes (Signed)
Physical Therapy Treatment Patient Details Name: Fred [name] MRN: [id number] DOB: July 22, 1951 Today's Date: 11/09/2022   History of Present Illness 72 y.o. male presents to [facility] hospital on 11/01/2022 with AMS, found to have UTI. PMH includes HTN, CKD III, DMII, BPH, intellectual disability.    PT Comments    Pt seen for PT tx with pt agreeable. Pt received in bed, able to complete bed mobility with mod I.  Pt transfers to standing with CGA and takes a few steps with CGA with pt turning in direction of walker. Provided pt with RW & pt ambulates with supervision with impaired, but likely baseline, gait pattern. Pt follows simple commands with extra time during session. Pt returns to supine at end. Recommend RW upon d/c for increased balance & safety with mobility.   Recommendations for follow up therapy are one component of a multi-disciplinary discharge planning process, led by the attending physician.  Recommendations may be updated based on patient status, additional functional criteria and insurance authorization.  Follow Up Recommendations  No PT follow up     Assistance Recommended at Discharge Frequent or constant Supervision/Assistance  Patient can return home with the following A little help with bathing/dressing/bathroom;Assistance with cooking/housework;Assistance with feeding;Direct supervision/assist for medications management;Direct supervision/assist for financial management;Assist for transportation;Help with stairs or ramp for entrance   Equipment Recommendations  Rolling walker (2 wheels)    Recommendations for Other Services       Precautions / Restrictions Precautions Precautions: Fall Restrictions Weight Bearing Restrictions: No     Mobility  Bed Mobility Overal bed mobility: Modified Independent             General bed mobility comments: supine<>sit    Transfers Overall transfer level: Needs assistance Equipment used: None Transfers: Sit to/from  Stand Sit to Stand: Min guard                Ambulation/Gait Ambulation/Gait assistance: Supervision Gait Distance (Feet): 200 Feet Assistive device: Rolling walker (2 wheels) Gait Pattern/deviations: Step-to pattern, Wide base of support, Shuffle Gait velocity: decreased     General Gait Details: Anterior lean, pushes RW out in front of him, decreased heel strike BLE, decreased dorsiflexion BLE, BLE feet turned out   Chief Strategy Officer    Modified Rankin (Stroke Patients Only)       Balance Overall balance assessment: Needs assistance Sitting-balance support: No upper extremity supported, Feet supported Sitting balance-Leahy Scale: Good     Standing balance support: During functional activity, Bilateral upper extremity supported Standing balance-Leahy Scale: Fair                              Cognition Arousal/Alertness: Awake/alert Behavior During Therapy: WFL for tasks assessed/performed Overall Cognitive Status: History of cognitive impairments - at baseline                                 General Comments: Pt requires verbal/visual cuing at times throughout session but follows simple commands inconsistently with extra time, pleasant throughout.        Exercises      General Comments        Pertinent Vitals/Pain Pain Assessment Pain Assessment: Faces Faces Pain Scale: No hurt    Home Living  Prior Function            PT Goals (current goals can now be found in the care plan section) Acute Rehab PT Goals Patient Stated Goal: patient unable to state, PT goal to return to group home PT Goal Formulation: Patient unable to participate in goal setting Time For Goal Achievement: 11/21/22 Potential to Achieve Goals: Good Additional Goals Additional Goal #1: Pt will score >19/24 on the DGI to indicate a reduced risk for falls Progress towards PT goals:  Progressing toward goals    Frequency    Min 3X/week      PT Plan Current plan remains appropriate;Equipment recommendations need to be updated    Co-evaluation              AM-PAC PT "6 Clicks" Mobility   Outcome Measure  Help needed turning from your back to your side while in a flat bed without using bedrails?: None Help needed moving from lying on your back to sitting on the side of a flat bed without using bedrails?: None Help needed moving to and from a bed to a chair (including a wheelchair)?: A Little Help needed standing up from a chair using your arms (e.g., wheelchair or bedside chair)?: A Little Help needed to walk in hospital room?: A Little Help needed climbing 3-5 steps with a railing? : A Lot 6 Click Score: 19    End of Session   Activity Tolerance: Patient tolerated treatment well Patient left: in bed;with call bell/phone within reach;with bed alarm set   PT Visit Diagnosis: Other abnormalities of gait and mobility (R26.89)     Time: [id number] PT Time Calculation (min) (ACUTE ONLY): 9 min  Charges:  $Therapeutic Activity: 8-22 mins                     Lavone Nian, PT, DPT 11/09/22, 9:45 AM   Waunita Schooner 11/09/2022, 9:43 AM

## 2022-11-09 NOTE — Inpatient Diabetes Management (Signed)
Inpatient Diabetes Program Recommendations  AACE/ADA: New Consensus Statement on Inpatient Glycemic Control (2015)  Target Ranges:  Prepandial:   less than 140 mg/dL      Peak postprandial:   less than 180 mg/dL (1-2 hours)      Critically ill patients:  140 - 180 mg/dL   Lab Results  Component Value Date   GLUCAP 204 (H) 11/09/2022   HGBA1C 5.5 11/02/2022    Review of Glycemic Control  Latest Reference Range & Units 11/08/22 15:23 11/08/22 18:34 11/08/22 20:11 11/09/22 08:33  Glucose-Capillary 70 - 99 mg/dL 61 (L) 326 (H) 274 (H) 204 (H)  (L): Data is abnormally low (H): Data is abnormally high Diabetes history: None noted Outpatient Diabetes medications: none Current orders for Inpatient glycemic control: Novolog 0-9 units TID & HS  Inpatient Diabetes Program Recommendations:    Noted mild hypoglycemia of 61 mg/dL yesterday following correction Novolog. Consider reducing correction to Novolog 0-6 units TID.  Thanks, Bronson Curb, MSN, RNC-OB Diabetes Coordinator [phone] (8a-5p)

## 2022-11-09 NOTE — Progress Notes (Signed)
PROGRESS NOTE        PATIENT DETAILS Name: Fred [name] Age: 72 y.o. Sex: male Date of Birth: 1950/09/03 Admit Date: 11/01/2022 Admitting Physician Kristopher Oppenheim, DO [id number], [facility]  Brief Summary: Patient is a 72 y.o.  male with history of intellectual disability, CKD stage IIIa, HTN, DM-2, BPH who presented with acute metabolic encephalopathy in the setting of UTI, and acute urinary retention.  Significant events: 3/5>> admit to [facility]  Significant studies: 3/5>> CXR: No acute findings 3/5>> CT head: No acute intracranial process 3/10>> renal ultrasound: No hydronephrosis  Significant microbiology data: 3/5>> COVID/influenza/RSV PCR: Negative 3/5>> blood culture: No growth  Procedures: None  Consults: None  Subjective: Lying comfortably in bed-denies any chest pain or shortness of breath.  Objective: Vitals: Blood pressure 125/80, pulse 84, temperature (!) 97.2 F (36.2 C), temperature source Oral, resp. rate 17, height '5\' 8"'$  (1.727 m), weight 65.2 kg, SpO2 95 %.   Exam: Gen Exam:not in any distress HEENT:atraumatic, normocephalic Chest: B/L clear to auscultation anteriorly CVS:S1S2 regular Abdomen:soft non tender, non distended Extremities:no edema Neurology: Non focal Skin: no rash  Pertinent Labs/Radiology:    Latest Ref Rng & Units 11/06/2022    5:58 AM 11/04/2022    8:34 AM 11/03/2022    6:10 AM  CBC  WBC 4.0 - 10.5 K/uL 7.9  9.5  12.2   Hemoglobin 13.0 - 17.0 g/dL 10.7  11.6  10.8   Hematocrit 39.0 - 52.0 % 32.8  35.7  33.9   Platelets 150 - 400 K/uL 184  167  136     Lab Results  Component Value Date   NA 142 11/09/2022   K 3.6 11/09/2022   CL 110 11/09/2022   CO2 24 11/09/2022      Assessment/Plan: Acute metabolic encephalopathy secondary to UTI Encephalopathy improved-back to baseline Completed a course of 7 days of IV Rocephin  Acute urinary retention Known history of BPH Foley  catheter placed this hospitalization as patient continued to have urinary retention in spite of multiple in/out catheterization Continue alfuzosin and finasteride Follows with urology at [facility] Discussed with patient's caretaker and sister over the phone-will attempt a voiding trial on 3/14 (both aware that it may fail requiring reinsertion of Foley catheter).  If catheter needs to be reinserted, caretaker will take patient back to group home with Foley catheter.  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  BMI: Estimated body mass index is 21.86 kg/m as calculated from the following:   Height as of this encounter: '5\' 8"'$  (1.727 m).   Weight as of this encounter: 65.2 kg.   Code status:   Code Status: DNR   DVT Prophylaxis: heparin injection 5,000 Units Start: 11/02/22 0600 SCDs Start: 11/02/22 0551    Family Communication: Adela Glimpse [phone], Sister-Cheryl Owens Shark  [phone] updated over the phone 3/13   Disposition Plan: Status is: Inpatient Remains inpatient appropriate because: Severity of illness   Planned Discharge Destination: [place]  Diet: Diet Order             Diet - low sodium heart healthy           Diet heart healthy/carb modified Room service appropriate? No; Fluid consistency: Thin  Diet effective now                     Antimicrobial agents: Anti-infectives (From admission, onward)    Start     Dose/Rate Route Frequency Ordered Stop   11/02/22 2000  vancomycin (VANCOREADY) IVPB 750 mg/150 mL  Status:   Discontinued        750 mg 150 mL/hr over 60 Minutes Intravenous Every 24 hours 11/01/22 2050 11/02/22 0058   11/02/22 1000  cefTRIAXone (ROCEPHIN) 1 g in sodium chloride 0.9 % 100 mL IVPB  Status:  Discontinued        1 g 200 mL/hr over 30 Minutes Intravenous Every 24 hours 11/02/22 0058 11/07/22 1600   11/02/22 0600  ceFEPIme (MAXIPIME) 2 g in sodium chloride 0.9 % 100 mL IVPB  Status:  Discontinued        2 g 200 mL/hr over 30 Minutes Intravenous Every 12 hours 11/01/22 2050 11/02/22 0058   11/01/22 1915  ceFEPIme (MAXIPIME) 2 g in sodium chloride 0.9 % 100 mL IVPB        2 g 200 mL/hr over 30 Minutes Intravenous  Once 11/01/22 1913 11/01/22 2005   11/01/22 1915  metroNIDAZOLE (FLAGYL) IVPB 500 mg        500 mg 100 mL/hr over 60 Minutes Intravenous  Once 11/01/22 1913 11/01/22 2048   11/01/22 1915  vancomycin (VANCOCIN) IVPB 1000 mg/200 mL premix        1,000 mg 200 mL/hr over 60 Minutes Intravenous  Once 11/01/22 1913 11/01/22 2109        MEDICATIONS: Scheduled Meds:  alfuzosin  10 mg Oral QPC breakfast   amLODipine  10 mg Oral Daily   aspirin EC  81 mg Oral Daily   atorvastatin  20 mg Oral Daily   benztropine  0.5 mg Oral Daily   carvedilol  12.5 mg Oral BID WC   Chlorhexidine Gluconate Cloth  6 each Topical Daily   finasteride  5 mg Oral Daily   heparin  5,000 Units Subcutaneous Q8H   insulin aspart  0-5 Units Subcutaneous QHS   insulin aspart  0-9 Units Subcutaneous TID WC   risperiDONE  0.5 mg Oral Daily   risperiDONE  3 mg Oral QHS   sertraline  50 mg Oral Daily   Continuous Infusions: PRN Meds:.acetaminophen **OR** acetaminophen, ondansetron **OR** ondansetron (ZOFRAN) IV   I have personally reviewed following labs and imaging studies  LABORATORY DATA: CBC: Recent Labs  Lab 11/03/22 0610 11/04/22 0834 11/06/22 0558  WBC 12.2* 9.5 7.9  NEUTROABS 10.7* 7.9* 5.7  HGB 10.8* 11.6* 10.7*  HCT 33.9* 35.7* 32.8*  MCV 91.9 91.8 91.9  PLT 136* 167 184     Basic Metabolic Panel: Recent Labs  Lab 11/03/22 0610 11/04/22 0834 11/06/22 0558 11/07/22 0745 11/09/22 0319  NA 144 144 148* 146* 142  K 3.6 3.2* 3.3* 3.3* 3.6  CL 113* 113* 118* 113* 110  CO2 22 23 21* 24 24  GLUCOSE 162* 158* 114* 122* 86  BUN 36* 36* 28* 18 24*  CREATININE 1.98* 2.03* 1.48* 1.14 0.99  CALCIUM 8.7* 8.3* 8.8* 8.7* 8.4*  MG  --   --  2.1 1.7 1.8  PHOS  --   --  3.4  --   --     GFR: Estimated Creatinine Clearance: 62.2 mL/min (by C-G formula based on SCr of 0.99 mg/dL).  Liver Function Tests: Recent Labs  Lab 11/06/22 0558  ALBUMIN 2.6*   No results for input(s): "LIPASE", "AMYLASE" in the last 168 hours. No results for input(s): "AMMONIA" in the last 168 hours.  Coagulation Profile: No results for input(s): "INR", "PROTIME" in the last 168 hours.  Cardiac Enzymes: No results for input(s): "CKTOTAL", "CKMB", "CKMBINDEX", "TROPONINI" in the last 168 hours.  BNP (last 3 results) No results for input(s): "PROBNP" in the last 8760 hours.  Lipid Profile: No results for input(s): "CHOL", "HDL", "LDLCALC", "TRIG", "CHOLHDL", "LDLDIRECT" in the last 72 hours.  Thyroid Function Tests: No results for input(s): "TSH", "T4TOTAL", "FREET4", "T3FREE", "THYROIDAB" in the last 72 hours.  Anemia Panel: No results for input(s): "VITAMINB12", "FOLATE", "FERRITIN", "TIBC", "IRON", "RETICCTPCT" in the last 72 hours.  Urine analysis:    Component Value Date/Time   COLORURINE STRAW (A) 11/06/2022 0644   APPEARANCEUR CLEAR 11/06/2022 0644   LABSPEC 1.005 11/06/2022 0644   PHURINE 5.0 11/06/2022 0644   GLUCOSEU NEGATIVE 11/06/2022 0644   HGBUR LARGE (A) 11/06/2022 0644   BILIRUBINUR NEGATIVE 11/06/2022 0644   KETONESUR NEGATIVE 11/06/2022 0644   PROTEINUR 30 (A) 11/06/2022 0644   UROBILINOGEN 1.0 02/27/2012 1053   NITRITE NEGATIVE 11/06/2022 0644   LEUKOCYTESUR NEGATIVE 11/06/2022 0644    Sepsis Labs: Lactic Acid, Venous    Component Value  Date/Time   LATICACIDVEN 1.5 11/01/2022 2146    MICROBIOLOGY: Recent Results (from the past 240 hour(s))  Blood Culture (routine x 2)     Status: None   Collection Time: 11/01/22  7:13 PM   Specimen: BLOOD LEFT WRIST  Result Value Ref Range Status   Specimen Description BLOOD LEFT WRIST  Final   Special Requests   Final    BOTTLES DRAWN AEROBIC AND ANAEROBIC Blood Culture results may not be optimal due to an excessive volume of blood received in culture bottles   Culture   Final    NO GROWTH 5 DAYS Performed at [facility], [place] [address] [place], [place] [address]    Report Status 11/06/2022 FINAL  Final  MRSA Next Gen by PCR, Nasal     Status: None   Collection Time: 11/01/22  7:27 PM   Specimen: Nasal Mucosa; Nasal Swab  Result Value Ref Range Status   MRSA by PCR Next Gen NOT DETECTED NOT DETECTED Final    Comment: (NOTE) The GeneXpert MRSA Assay (FDA approved for NASAL specimens only), is one component of a comprehensive MRSA colonization surveillance program. It is not intended to diagnose MRSA infection nor to guide or monitor treatment for MRSA infections. Test performance is not FDA approved in patients less than 91 years old. Performed at [facility], [place] [address], [place], [place] [address]   Blood Culture (routine x 2)     Status: None   Collection Time: 11/01/22  7:39 PM   Specimen: BLOOD  Result Value Ref Range Status   Specimen Description BLOOD BLOOD RIGHT FOREARM  Final   Special Requests   Final    BOTTLES DRAWN AEROBIC AND ANAEROBIC Blood Culture results may not be optimal due to an excessive volume of blood received in culture bottles   Culture   Final    NO GROWTH 5 DAYS Performed at [facility], [place] [address] [place], [place] [address]  Report Status 11/06/2022 FINAL  Final  Resp panel by RT-PCR (RSV, Flu A&B, Covid) Anterior Nasal Swab     Status: None   Collection Time: 11/01/22  8:12 PM   Specimen: Anterior Nasal Swab   Result Value Ref Range Status   SARS Coronavirus 2 by RT PCR NEGATIVE NEGATIVE Final   Influenza A by PCR NEGATIVE NEGATIVE Final   Influenza B by PCR NEGATIVE NEGATIVE Final    Comment: (NOTE) The Xpert Xpress SARS-CoV-2/FLU/RSV plus assay is intended as an aid in the diagnosis of influenza from Nasopharyngeal swab specimens and should not be used as a sole basis for treatment. Nasal washings and aspirates are unacceptable for Xpert Xpress SARS-CoV-2/FLU/RSV testing.  Fact Sheet for Patients: EntrepreneurPulse.com.au  Fact Sheet for Healthcare Providers: IncredibleEmployment.be  This test is not yet approved or cleared by the [place] FDA and has been authorized for detection and/or diagnosis of SARS-CoV-2 by FDA under an Emergency Use Authorization (EUA). This EUA will remain in effect (meaning this test can be used) for the duration of the COVID-19 declaration under Section 564(b)(1) of the Act, 21 U.S.C. section 360bbb-3(b)(1), unless the authorization is terminated or revoked.     Resp Syncytial Virus by PCR NEGATIVE NEGATIVE Final    Comment: (NOTE) Fact Sheet for Patients: EntrepreneurPulse.com.au  Fact Sheet for Healthcare Providers: IncredibleEmployment.be  This test is not yet approved or cleared by the [place] FDA and has been authorized for detection and/or diagnosis of SARS-CoV-2 by FDA under an Emergency Use Authorization (EUA). This EUA will remain in effect (meaning this test can be used) for the duration of the COVID-19 declaration under Section 564(b)(1) of the Act, 21 U.S.C. section 360bbb-3(b)(1), unless the authorization is terminated or revoked.  Performed at [facility], [place] [address] [place], [place] [address]     RADIOLOGY STUDIES/RESULTS: No results found.   LOS: 7 days   Oren Binet, MD  [facility]    To contact the attending  provider between 7A-7P or the covering provider during after hours 7P-7A, please log into the web site [email or web address] and access using universal [place] password for that web site. If you do not have the password, please call the hospital operator.  11/09/2022, 12:35 PM

## 2022-11-10 ENCOUNTER — Other Ambulatory Visit (HOSPITAL_COMMUNITY): Payer: Self-pay

## 2022-11-10 DIAGNOSIS — N401 Enlarged prostate with lower urinary tract symptoms: Secondary | ICD-10-CM | POA: Diagnosis not present

## 2022-11-10 DIAGNOSIS — E119 Type 2 diabetes mellitus without complications: Secondary | ICD-10-CM | POA: Diagnosis not present

## 2022-11-10 DIAGNOSIS — F79 Unspecified intellectual disabilities: Secondary | ICD-10-CM | POA: Diagnosis not present

## 2022-11-10 DIAGNOSIS — N3000 Acute cystitis without hematuria: Secondary | ICD-10-CM | POA: Diagnosis not present

## 2022-11-10 LAB — GLUCOSE, CAPILLARY
Glucose-Capillary: 108 mg/dL — ABNORMAL HIGH (ref 70–99)
Glucose-Capillary: 190 mg/dL — ABNORMAL HIGH (ref 70–99)
Glucose-Capillary: 103 mg/dL — ABNORMAL HIGH (ref 70–99)
Glucose-Capillary: 223 mg/dL — ABNORMAL HIGH (ref 70–99)

## 2022-11-10 MED ORDER — FINASTERIDE 5 MG PO TABS
5.0000 mg | ORAL_TABLET | Freq: Every day | ORAL | 2 refills | Status: AC
  Filled 2022-11-10: qty 30, 30d supply, fill #0

## 2022-11-10 NOTE — Discharge Summary (Addendum)
PATIENT DETAILS Name: Fred [name] Age: 72 y.o. Sex: male Date of Birth: [birth year] MRN: [id number]. Admitting Physician: Kristopher Oppenheim, DO [id number], [facility]  Admit Date: 11/01/2022 Discharge date: 11/22/2022  Recommendations for Outpatient Follow-up:  Follow up with PCP in 1-2 weeks Please obtain CMP/CBC in one week Please ensure follow up urology within a week of discharge. Incidental finding-echogenic mass superior pole of right kidney-radiology recommending repeat ultrasound in 6 months.  Defer to PCP.  Admitted From:  Group home  Disposition: Group home   Discharge Condition: fair  CODE STATUS:   Code Status: DNR   Diet recommendation:  Diet Order             Diet - low sodium heart healthy           Diet heart healthy/carb modified Room service appropriate? No; Fluid consistency: Thin  Diet effective now                    Brief Summary: Acute metabolic encephalopathy secondary to UTI Encephalopathy improved-back to baseline Completed a course of 7 days of IV Rocephin   Acute urinary retention Known history of BPH Foley catheter placed this hospitalization as patient continued to have urinary retention in spite of multiple in/out catheterization, failed Foley catheter removal, will be discharged with Foley catheter.  Continue alfuzosin and finasteride, post discharge follow-up with his urologist at [facility] within a week.   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  To follow-up with his urologist as well.   BMI: Estimated body mass index is 21.86 kg/m as calculated from the following:   Height as of this  encounter: 5\' 8"  (1.727 m).   Weight as of this encounter: 65.2 kg.   Discharge Diagnoses:  Principal Problem:   Acute cystitis without hematuria Active Problems:   Acute metabolic encephalopathy   BPH with urinary obstruction   Hypertension associated with diabetes ([place])   Intellectual disability   Type 2 diabetes mellitus without complication, without long-term current use of insulin (HCC)   Hypokalemia   DNR (do not resuscitate)/DNI(Do Not Intubate)   Severe sepsis ([place])   AKI (acute kidney injury) [facility])   Discharge Instructions:  Activity:  As tolerated with Full fall precautions use walker/cane & assistance as needed   Discharge Instructions     Call MD for:  difficulty breathing, headache or visual disturbances   Complete by: As directed    Call MD for:  extreme fatigue   Complete by: As directed    Call MD for:  persistant dizziness or light-headedness   Complete by: As directed    Call MD for:  persistant nausea and vomiting   Complete by: As directed    Call MD for:  severe uncontrolled pain   Complete by: As directed    Call MD for:  temperature >100.4   Complete by: As directed    Diet - low sodium heart healthy   Complete by: As directed    Discharge instructions   Complete by: As directed    [facility] [place] in 7 days   Get CBC, CMP  -  checked next visit with  your primary MD    Activity: As tolerated with Full fall precautions use walker/cane & assistance as needed  Disposition group home  Diet: Heart Healthy    Special Instructions: If you have smoked or chewed Tobacco  in the last 2 yrs please stop smoking, stop any regular Alcohol  and or any Recreational drug use.  On your next visit with your primary care physician please Get Medicines reviewed and adjusted.  Please request your Prim.MD to go over all Hospital Tests and Procedure/Radiological results at the follow up, please get all Hospital  records sent to your Prim MD by signing hospital release before you go home.  If you experience worsening of your admission symptoms, develop shortness of breath, life threatening emergency, suicidal or homicidal thoughts you must seek medical attention immediately by calling 911 or calling your MD immediately  if symptoms less severe.  You Must read complete instructions/literature along with all the possible adverse reactions/side effects for all the Medicines you take and that have been prescribed to you. Take any new Medicines after you have completely understood and accpet all the possible adverse reactions/side effects.   Increase activity slowly   Complete by: As directed    Increase activity slowly   Complete by: As directed    Increase activity slowly   Complete by: As directed       Allergies as of 11/22/2022   No Known Allergies      Medication List     STOP taking these medications    lisinopril-hydrochlorothiazide 20-25 MG tablet Commonly known as: ZESTORETIC   potassium chloride 10 MEQ tablet Commonly known as: KLOR-CON       TAKE these medications    Accu-Chek Softclix Lancet Dev Kit by Does not apply route.   alfuzosin 10 MG 24 hr tablet Commonly known as: UROXATRAL Take 10 mg by mouth daily after breakfast.   Allergy Relief 10 MG tablet Generic drug: loratadine Take 10 mg by mouth daily.   amLODipine 10 MG tablet Commonly known as: NORVASC Take 10 mg by mouth daily.   aspirin EC 81 MG tablet Take 81 mg by mouth daily.   atorvastatin 20 MG tablet Commonly known as: LIPITOR Take 20 mg by mouth daily.   benztropine 0.5 MG tablet Commonly known as: COGENTIN Take 0.5 mg by mouth daily.   carvedilol 12.5 MG tablet Commonly known as: COREG Take 12.5 mg by mouth 2 (two) times daily with a meal.   ferrous sulfate 325 (65 FE) MG tablet Take 325 mg by mouth every Monday, Wednesday, and Friday.   finasteride 5 MG tablet Commonly known as:  Proscar Take 1 tablet (5 mg total) by mouth daily.   glucose monitoring kit monitoring kit Please dispense whichever glucometer is covered by pt's insurance with lancets and strips also; check blood sugar as directed   ketoconazole 2 % shampoo Commonly known as: NIZORAL Apply 1 Application topically 2 (two) times a week.   metFORMIN 500 MG tablet Commonly known as: GLUCOPHAGE Take 500 mg by mouth 2 (two) times daily.   risperiDONE 0.5 MG tablet Commonly known as: RISPERDAL Take 0.5 mg by mouth daily.   risperiDONE 3 MG tablet Commonly known as: RISPERDAL Take 3 mg by mouth at bedtime.   sertraline 50 MG tablet Commonly known as: ZOLOFT Take 50 mg by mouth daily.               Durable Medical Equipment  (From admission, onward)  Start     Ordered   11/14/22 1024  For home use only DME Walker rolling  Once       Question Answer Comment  Walker: With 5 Inch Wheels   Patient needs a walker to treat with the following condition Chronic kidney disease (CKD)   Patient needs a walker to treat with the following condition Intellectual disability      11/14/22 1025   11/09/22 0946  For home use only DME Walker rolling  Once       Question Answer Comment  Walker: With [place]   Patient needs a walker to treat with the following condition General weakness      11/09/22 0945            Follow-up Information     [facility] [place]. Schedule an appointment as soon as possible for a visit in 1 week(s).   Specialty: Family Medicine Contact information: [address] [place] [address] [phone]         Arma Heading, MD. Schedule an appointment as soon as possible for a visit in 1 week(s).   Specialty: Urology Contact information: [address] [place] [address] [phone]         Charlynne Pander, PA-C Follow up.   Specialty: General Surgery Why: TIME : 1:30 PM DATE  : [facility] Contact information: [address] [address] [phone]         [facility] Follow up.   Specialty: Home Health Services Why: [facility] will be providing Skilled nursing services and will start within 48 hours of DC Contact information: [address] [place] [address] [phone]         [employer] Follow up.   Why: [employer] has provided your rolling walker Contact information: [address]  [id number]  [place], [place]  [address]  [phone]               No Known Allergies   Other Procedures/Studies: US RENAL  Result Date: 11/06/2022 CLINICAL DATA:  Hematuria EXAM: RENAL / URINARY TRACT ULTRASOUND COMPLETE COMPARISON:  CT abdomen pelvis 02/27/2012 FINDINGS: Right Kidney: Renal measurements: 11.4 x 5.6 x 5.5 cm = volume: 183 mL. Normal renal cortical thickness and echogenicity. No hydronephrosis. Multiple renal cysts including a 2.5 x 2.4 cm cyst superior pole and a 0.9 x 0.9 cm cyst superior pole. Additionally there is a 0.9 x 0.9 cm echogenic mass, too small to characterize superior pole right kidney. No imaging follow-up needed for the simple cysts. Left Kidney: Renal measurements: 12.2 x 6.2 x 4.4 cm = volume: 170.7 mL. Normal renal cortical thickness and echogenicity. No hydronephrosis. Within the superior pole there is a 3.7 x 2.8 x 2.1 cm cyst. There is an additional 3.0 x 2.8 cm cyst within the left kidney. No imaging follow-up needed for the simple cysts. Bladder: Decompressed with Foley catheter. Other: None. IMPRESSION: 1. No hydronephrosis. 2. There is a 9 mm echogenic mass within the superior pole of the right kidney, too small to characterize. 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 Consider follow-up renal ultrasound in 6 months to assess for stability. Electronically Signed   By: Lovey Newcomer M.D.   On: 11/06/2022 11:50   DG CHEST PORT 1 VIEW  Result Date: 11/06/2022 CLINICAL DATA:  Altered  mental status. EXAM: PORTABLE CHEST 1 VIEW COMPARISON:  11/01/2022. FINDINGS: New  small right pleural effusion with adjacent atelectasis in the right lung base. Stable cardiomegaly and mediastinal contours. No pneumothorax. IMPRESSION: New small right pleural effusion with adjacent atelectasis in the right lung base. Electronically Signed   By: Emmit Alexanders M.D.   On: 11/06/2022 10:29   CT Head Wo Contrast  Result Date: 11/01/2022 CLINICAL DATA:  Altered mental status EXAM: CT HEAD WITHOUT CONTRAST TECHNIQUE: Contiguous axial images were obtained from the base of the skull through the vertex without intravenous contrast. RADIATION DOSE REDUCTION: This exam was performed according to the departmental dose-optimization program which includes automated exposure control, adjustment of the mA and/or kV according to patient size and/or use of iterative reconstruction technique. COMPARISON:  04/19/2020 CT head FINDINGS: Brain: No evidence of acute infarction, hemorrhage, mass, mass effect, or midline shift. No hydrocephalus or extra-axial fluid collection. Vascular: No hyperdense vessel. Skull: Negative for fracture or focal lesion. Sinuses/Orbits: Mucous retention cyst in the right maxillary sinus. Mild mucosal thickening in the maxillary sinuses and ethmoid air cells. Dysconjugate gaze. Other: None. IMPRESSION: No acute intracranial process. Electronically Signed   By: Merilyn Baba M.D.   On: 11/01/2022 23:27   DG Chest Port 1 View  Result Date: 11/01/2022 CLINICAL DATA:  Questionable sepsis - evaluate for abnormality EXAM: PORTABLE CHEST 1 VIEW COMPARISON:  Radiograph 02/27/2012 FINDINGS: Chronic cardiomegaly. Unchanged mediastinal contours. Evidence of acute or focal airspace disease. No pulmonary edema, large pleural effusion or pneumothorax. Stable osseous structures. IMPRESSION: Chronic cardiomegaly.  No acute findings. Electronically Signed   By: Keith Rake M.D.   On: 11/01/2022 19:52      TODAY-DAY OF DISCHARGE:  Subjective:   Raji Heitner today has no headache,no chest abdominal pain,no new weakness tingling or numbness, feels much better wants to go home today.   Objective:   Blood pressure 119/65, pulse 78, temperature (!) 97.4 F (36.3 C), temperature source Axillary, resp. rate 17, height 5\' 8"  (1.727 m), weight 58.4 kg, SpO2 94 %.  Intake/Output Summary (Last 24 hours) at 11/22/2022 0906 Last data filed at 11/22/2022 0336 Gross per 24 hour  Intake --  Output 3350 ml  Net -3350 ml   Filed Weights   11/17/22 0300 11/20/22 0500 11/21/22 0500  Weight: 62.3 kg 58.6 kg 58.4 kg    Exam: Awake Alert, Oriented *3, No new F.N deficits, Normal affect [place].AT,PERRAL Supple Neck,No JVD, No cervical lymphadenopathy appriciated.  Symmetrical Chest wall movement, Good air movement bilaterally, CTAB RRR,No Gallops,Rubs or new Murmurs, No Parasternal Heave +ve B.Sounds, Abd Soft, Non tender, No organomegaly appriciated, No rebound -guarding or rigidity. No Cyanosis, Clubbing or edema, No new Rash or bruise   PERTINENT RADIOLOGIC STUDIES: No results found.   PERTINENT LAB RESULTS: CBC: No results for input(s): "WBC", "HGB", "HCT", "PLT" in the last 72 hours. CMET CMP     Component Value Date/Time   NA 142 11/18/2022 0624   K 4.0 11/18/2022 0624   CL 108 11/18/2022 0624   CO2 26 11/18/2022 0624   GLUCOSE 85 11/18/2022 0624   BUN 28 (H) 11/18/2022 0624   CREATININE 1.00 11/18/2022 0624   CALCIUM 8.9 11/18/2022 0624   PROT 6.4 (L) 11/02/2022 0620   ALBUMIN 2.6 (L) 11/06/2022 0558   AST 41 11/02/2022 0620   ALT 31 11/02/2022 0620   ALKPHOS 56 11/02/2022 0620   BILITOT 1.1 11/02/2022 0620   GFRNONAA >60 11/18/2022 0624   GFRAA >60 04/19/2020 2008    GFR Estimated Creatinine Clearance: 55.2 mL/min (by C-G  formula based on SCr of 1 mg/dL). No results for input(s): "LIPASE", "AMYLASE" in the last 72 hours. No results for input(s): "CKTOTAL", "CKMB",  "CKMBINDEX", "TROPONINI" in the last 72 hours. Invalid input(s): "POCBNP" No results for input(s): "DDIMER" in the last 72 hours. No results for input(s): "HGBA1C" in the last 72 hours. No results for input(s): "CHOL", "HDL", "LDLCALC", "TRIG", "CHOLHDL", "LDLDIRECT" in the last 72 hours. No results for input(s): "TSH", "T4TOTAL", "T3FREE", "THYROIDAB" in the last 72 hours.  Invalid input(s): "FREET3" No results for input(s): "VITAMINB12", "FOLATE", "FERRITIN", "TIBC", "IRON", "RETICCTPCT" in the last 72 hours. Coags: No results for input(s): "INR" in the last 72 hours.  Invalid input(s): "PT" Microbiology: No results found for this or any previous visit (from the past 240 hour(s)).   FURTHER DISCHARGE INSTRUCTIONS:  Get Medicines reviewed and adjusted: Please take all your medications with you for your next visit with your Primary MD  Laboratory/radiological data: Please request your Primary MD to go over all hospital tests and procedure/radiological results at the follow up, please ask your Primary MD to get all Hospital records sent to his/her office.  In some cases, they will be blood work, cultures and biopsy results pending at the time of your discharge. Please request that your primary care M.D. goes through all the records of your hospital data and follows up on these results.  Also Note the following: If you experience worsening of your admission symptoms, develop shortness of breath, life threatening emergency, suicidal or homicidal thoughts you must seek medical attention immediately by calling 911 or calling your MD immediately  if symptoms less severe.  You must read complete instructions/literature along with all the possible adverse reactions/side effects for all the Medicines you take and that have been prescribed to you. Take any new Medicines after you have completely understood and accpet all the possible adverse reactions/side effects.   Do not drive when taking  Pain medications or sleeping medications (Benzodaizepines)  Do not take more than prescribed Pain, Sleep and Anxiety Medications. It is not advisable to combine anxiety,sleep and pain medications without talking with your primary care practitioner  Special Instructions: If you have smoked or chewed Tobacco  in the last 2 yrs please stop smoking, stop any regular Alcohol  and or any Recreational drug use.  Wear Seat belts while driving.  Please note: You were cared for by a hospitalist during your hospital stay. Once you are discharged, your primary care physician will handle any further medical issues. Please note that NO REFILLS for any discharge medications will be authorized once you are discharged, as it is imperative that you return to your primary care physician (or establish a relationship with a primary care physician if you do not have one) for your post hospital discharge needs so that they can reassess your need for medications and monitor your lab values.  Total Time spent coordinating discharge including counseling, education and face to face time equals greater than 30 minutes.  Signed: Lala Lund 11/22/2022 9:06 AM

## 2022-11-10 NOTE — Progress Notes (Signed)
Mobility Specialist Progress Note   11/10/22 1615  Mobility  Activity Ambulated with assistance in hallway  Level of Assistance Minimal assist, patient does 75% or more  Assistive Device Front wheel walker  Distance Ambulated (ft) 180 ft  Activity Response Tolerated well  Mobility Referral Yes  $Mobility charge 1 Mobility   Received in bed without complaint and agreeable. Pt requiring multimodal cues throughout session d/t poor cognition, CGA for steadiness during ambulation. Returned to bd w/o fault and bed alarm on.  Holland Falling Mobility Specialist Please contact via SecureChat or  Rehab office at [phone]

## 2022-11-10 NOTE — Care Management Important Message (Signed)
Important Message  Patient Details  Name: Fred [name] MRN: [id number] Date of Birth: December 19, 1950   Medicare Important Message Given:  Yes     [name] 11/10/2022, 2:55 PM

## 2022-11-10 NOTE — TOC Transition Note (Addendum)
Transition of Care [facility]) - CM/SW Discharge Note   Patient Details  Name: Aubrey Powelson MRN: [id number] Date of Birth: 1951/05/03  [facility] CM/SW Contact:  Levonne Lapping, RN Phone Number: 11/10/2022, 11:30 AM  2:35 PM  Update CM spoke with Supervisor. Supervisor explained that "Group Homes" do not take patients needing Foley catheters and that "Family care homes" are at their [place] discretion whether they accept a foley resident or not. If they do not have the ability/appropriate training,etc, they have the right to refuse.   CM contacted the patient's Legal Guardian, his Sister Malachy Mood and suggested she refer to the Medicare letter she received on the 7th of the month to submit an appeal of discharge. Malachy Mood remembered receiving the letter and stated she will call to appeal after work . \\Provider notified- DC order will remain , per Provider     12:13 PM update  CM received Call from [facility]- owner of Family care home where patient resides.  Demetrius Charity  [phone] stated again that they are not equipped top handle a foley. CM explained that the caregiver picking up the patient is going to be bedside at 3:30 and will receive all DC instructions and foley care instructions per their conversation and seemed to have no concerns.  CM also explained that [facility] will be provided and they will come out for [facility] within 48 hours. Demetrius Charity was insistent again that he not be dc'd. CM offered to have Provider contact her Forde Dandy ended  CM asked Provider to contact her.  (Provider had contacted the Family care home yesterday and spoke with Caretaker as well as Patients Sister and explained that a foley is not a reason to keep someone who is medically stable in the hospital. That was in response to [facility] stating he couldn't come back two days ago when we wanted to DC him then) Provider told caretaker and Sister they were doing a trial to remove foley yesterday and he  would be ready for DC today with or without the foley. Provider stated that both Caretaker and Sister were on board.  Unfortunately Patient ended up having to have foley reinserted.  Provider did call [employer] and met with same resistance  Provider stated that patient is medically stable and DC will not be removed. Provider contacted CM to escalate to [place]. Supervisor contacted.  TOC will follow       Clinical Narrative:     Patient will DC back to [facility]. Caretaker, Wonda Horner will be picking patient up at the room and will receive DC instructions including Foley care.  Home Health skilled nursing has been ordered and will be provided by [facility].  No DDME has been recommended .   No additional TOC needs identified      Barriers to Discharge: Other (must enter comment) (Family Group home unable to accept with catheter.  May return when catheter is removed)   Patient Goals and CMS Choice      Discharge Placement                         Discharge Plan and Services Additional resources added to the After Visit Summary for                                       Social Determinants of  Health (SDOH) Interventions SDOH Screenings   Tobacco Use: Medium Risk (11/01/2022)     Readmission Risk Interventions     No data to display

## 2022-11-11 DIAGNOSIS — N401 Enlarged prostate with lower urinary tract symptoms: Secondary | ICD-10-CM | POA: Diagnosis not present

## 2022-11-11 DIAGNOSIS — N3000 Acute cystitis without hematuria: Secondary | ICD-10-CM | POA: Diagnosis not present

## 2022-11-11 DIAGNOSIS — N138 Other obstructive and reflux uropathy: Secondary | ICD-10-CM | POA: Diagnosis not present

## 2022-11-11 LAB — GLUCOSE, CAPILLARY
Glucose-Capillary: 174 mg/dL — ABNORMAL HIGH (ref 70–99)
Glucose-Capillary: 141 mg/dL — ABNORMAL HIGH (ref 70–99)
Glucose-Capillary: 105 mg/dL — ABNORMAL HIGH (ref 70–99)
Glucose-Capillary: 152 mg/dL — ABNORMAL HIGH (ref 70–99)

## 2022-11-11 NOTE — Progress Notes (Signed)
PROGRESS NOTE        PATIENT DETAILS Name: Fred [name] Age: 72 y.o. Sex: male Date of Birth: [birth year] Admit Date: 11/01/2022 Admitting Physician Kristopher Oppenheim, DO [id number], [facility]  Brief Summary: Patient is a 72 y.o.  male with history of intellectual disability, CKD stage IIIa, HTN, DM-2, BPH who presented with acute metabolic encephalopathy in the setting of UTI, and acute urinary retention.  Significant events: 3/5>> admit to [facility] 3/14>> voiding trial failed-400 cc of urine on bladder ultrasound-patient dribbling-Foley placed-1000 cc urine drained.  Discharged to group home but family filed appeal.  Significant studies: 3/5>> CXR: No acute findings 3/5>> CT head: No acute intracranial process 3/10>> renal ultrasound: No hydronephrosis  Significant microbiology data: 3/5>> COVID/influenza/RSV PCR: Negative 3/5>> blood culture: No growth  Procedures: None  Consults: None  Subjective: No major issues overnight.  Sitting up in bed-just finished breakfast.  Objective: Vitals: Blood pressure (!) 135/98, pulse 88, temperature 98.2 F (36.8 C), temperature source Oral, resp. rate 17, height 5\' 8"  (1.727 m), weight 62.4 kg, SpO2 99 %.   Exam: Gen Exam:not in any distress HEENT:atraumatic, normocephalic Chest: B/L clear to auscultation anteriorly CVS:S1S2 regular Abdomen:soft non tender, non distended Extremities:no edema Neurology: Non focal Skin: no rash  Pertinent Labs/Radiology:    Latest Ref Rng & Units 11/06/2022    5:58 AM 11/04/2022    8:34 AM 11/03/2022    6:10 AM  CBC  WBC 4.0 - 10.5 K/uL 7.9  9.5  12.2   Hemoglobin 13.0 - 17.0 g/dL 10.7  11.6  10.8   Hematocrit 39.0 - 52.0 % 32.8  35.7  33.9   Platelets 150 - 400 K/uL 184  167  136     Lab Results  Component Value Date   NA 142 11/09/2022   K 3.6 11/09/2022   CL 110 11/09/2022   CO2 24 11/09/2022      Assessment/Plan: Acute metabolic  encephalopathy secondary to UTI Encephalopathy improved-back to baseline Completed a course of 7 days of IV Rocephin  Acute urinary retention Known history of BPH Failed voiding trial on 3/14-Foley reinserted-1000 cc of urine drained immediately Continue alfuzosin/finasteride Will need outpatient voiding trial-follow[facility] with urology at [facility]  CKD stage IIIa At baseline  HTN BP stable Continue amlodipine/Coreg  HLD Statin  DM-2 Resume metformin on discharge CBG stable with SSI  Recent Labs    11/10/22 1545 11/10/22 2135 11/11/22 0754  GLUCAP 103* 223* 174*     Intellectual disability At baseline Continue risperidone/benztropine/Zoloft  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  BMI: Estimated body mass index is 20.92 kg/m as calculated from the following:   Height as of this encounter: 5\' 8"  (1.727 m).   Weight as of this encounter: 62.4 kg.   Code status:   Code Status: DNR   DVT Prophylaxis: heparin injection 5,000 Units Start: 11/02/22 0600 SCDs Start: 11/02/22 0551    Family Communication: Adela Glimpse [phone], Sister-Cheryl Owens Shark  [phone] updated over the phone 3/13   Disposition Plan: Status is: Inpatient Remains inpatient appropriate because: Remains medically stable for discharge-family has filed appeal.   Planned Discharge Destination: Group Home   Diet: Diet Order             Diet - low sodium heart  healthy           Diet heart healthy/carb modified Room service appropriate? No; Fluid consistency: Thin  Diet effective now                     Antimicrobial agents: Anti-infectives (From admission, onward)    Start     Dose/Rate Route Frequency Ordered Stop   11/02/22 2000  vancomycin (VANCOREADY) IVPB 750 mg/150 mL  Status:  Discontinued        750 mg 150 mL/hr over 60 Minutes Intravenous Every 24 hours 11/01/22 2050  11/02/22 0058   11/02/22 1000  cefTRIAXone (ROCEPHIN) 1 g in sodium chloride 0.9 % 100 mL IVPB  Status:  Discontinued        1 g 200 mL/hr over 30 Minutes Intravenous Every 24 hours 11/02/22 0058 11/07/22 1600   11/02/22 0600  ceFEPIme (MAXIPIME) 2 g in sodium chloride 0.9 % 100 mL IVPB  Status:  Discontinued        2 g 200 mL/hr over 30 Minutes Intravenous Every 12 hours 11/01/22 2050 11/02/22 0058   11/01/22 1915  ceFEPIme (MAXIPIME) 2 g in sodium chloride 0.9 % 100 mL IVPB        2 g 200 mL/hr over 30 Minutes Intravenous  Once 11/01/22 1913 11/01/22 2005   11/01/22 1915  metroNIDAZOLE (FLAGYL) IVPB 500 mg        500 mg 100 mL/hr over 60 Minutes Intravenous  Once 11/01/22 1913 11/01/22 2048   11/01/22 1915  vancomycin (VANCOCIN) IVPB 1000 mg/200 mL premix        1,000 mg 200 mL/hr over 60 Minutes Intravenous  Once 11/01/22 1913 11/01/22 2109        MEDICATIONS: Scheduled Meds:  alfuzosin  10 mg Oral QPC breakfast   amLODipine  10 mg Oral Daily   aspirin EC  81 mg Oral Daily   atorvastatin  20 mg Oral Daily   benztropine  0.5 mg Oral Daily   carvedilol  12.5 mg Oral BID WC   Chlorhexidine Gluconate Cloth  6 each Topical Daily   finasteride  5 mg Oral Daily   heparin  5,000 Units Subcutaneous Q8H   insulin aspart  0-5 Units Subcutaneous QHS   insulin aspart  0-9 Units Subcutaneous TID WC   risperiDONE  0.5 mg Oral Daily   risperiDONE  3 mg Oral QHS   sertraline  50 mg Oral Daily   Continuous Infusions: PRN Meds:.acetaminophen **OR** acetaminophen, ondansetron **OR** ondansetron (ZOFRAN) IV   I have personally reviewed following labs and imaging studies  LABORATORY DATA: CBC: Recent Labs  Lab 11/06/22 0558  WBC 7.9  NEUTROABS 5.7  HGB 10.7*  HCT 32.8*  MCV 91.9  PLT 184     Basic Metabolic Panel: Recent Labs  Lab 11/06/22 0558 11/07/22 0745 11/09/22 0319  NA 148* 146* 142  K 3.3* 3.3* 3.6  CL 118* 113* 110  CO2 21* 24 24  GLUCOSE 114* 122* 86  BUN  28* 18 24*  CREATININE 1.48* 1.14 0.99  CALCIUM 8.8* 8.7* 8.4*  MG 2.1 1.7 1.8  PHOS 3.4  --   --      GFR: Estimated Creatinine Clearance: 59.5 mL/min (by C-G formula based on SCr of 0.99 mg/dL).  Liver Function Tests: Recent Labs  Lab 11/06/22 0558  ALBUMIN 2.6*    No results for input(s): "LIPASE", "AMYLASE" in the last 168 hours. No results for input(s): "AMMONIA" in the last 168 hours.  Coagulation Profile: No results for input(s): "INR", "PROTIME" in the last 168 hours.  Cardiac Enzymes: No results for input(s): "CKTOTAL", "CKMB", "CKMBINDEX", "TROPONINI" in the last 168 hours.  BNP (last 3 results) No results for input(s): "PROBNP" in the last 8760 hours.  Lipid Profile: No results for input(s): "CHOL", "HDL", "LDLCALC", "TRIG", "CHOLHDL", "LDLDIRECT" in the last 72 hours.  Thyroid Function Tests: No results for input(s): "TSH", "T4TOTAL", "FREET4", "T3FREE", "THYROIDAB" in the last 72 hours.  Anemia Panel: No results for input(s): "VITAMINB12", "FOLATE", "FERRITIN", "TIBC", "IRON", "RETICCTPCT" in the last 72 hours.  Urine analysis:    Component Value Date/Time   COLORURINE STRAW (A) 11/06/2022 0644   APPEARANCEUR CLEAR 11/06/2022 0644   LABSPEC 1.005 11/06/2022 0644   PHURINE 5.0 11/06/2022 0644   GLUCOSEU NEGATIVE 11/06/2022 0644   HGBUR LARGE (A) 11/06/2022 0644   BILIRUBINUR NEGATIVE 11/06/2022 0644   KETONESUR NEGATIVE 11/06/2022 0644   PROTEINUR 30 (A) 11/06/2022 0644   UROBILINOGEN 1.0 02/27/2012 1053   NITRITE NEGATIVE 11/06/2022 0644   LEUKOCYTESUR NEGATIVE 11/06/2022 0644    Sepsis Labs: Lactic Acid, Venous    Component Value Date/Time   LATICACIDVEN 1.5 11/01/2022 2146    MICROBIOLOGY: Recent Results (from the past 240 hour(s))  Blood Culture (routine x 2)     Status: None   Collection Time: 11/01/22  7:13 PM   Specimen: BLOOD LEFT WRIST  Result Value Ref Range Status   Specimen Description BLOOD LEFT WRIST  Final   Special  Requests   Final    BOTTLES DRAWN AEROBIC AND ANAEROBIC Blood Culture results may not be optimal due to an excessive volume of blood received in culture bottles   Culture   Final    NO GROWTH 5 DAYS Performed at [facility], [place] [address] [place], [place] [address]    Report Status 11/06/2022 FINAL  Final  MRSA Next Gen by PCR, Nasal     Status: None   Collection Time: 11/01/22  7:27 PM   Specimen: Nasal Mucosa; Nasal Swab  Result Value Ref Range Status   MRSA by PCR Next Gen NOT DETECTED NOT DETECTED Final    Comment: (NOTE) The GeneXpert MRSA Assay (FDA approved for NASAL specimens only), is one component of a comprehensive MRSA colonization surveillance program. It is not intended to diagnose MRSA infection nor to guide or monitor treatment for MRSA infections. Test performance is not FDA approved in patients less than 38 years old. Performed at [facility], [place] [address] [place], [place] [address]   Blood Culture (routine x 2)     Status: None   Collection Time: 11/01/22  7:39 PM   Specimen: BLOOD  Result Value Ref Range Status   Specimen Description BLOOD BLOOD RIGHT FOREARM  Final   Special Requests   Final    BOTTLES DRAWN AEROBIC AND ANAEROBIC Blood Culture results may not be optimal due to an excessive volume of blood received in culture bottles   Culture   Final    NO GROWTH 5 DAYS Performed at [facility], [place] [address], [place], [place] [address]    Report Status 11/06/2022 FINAL  Final  Resp panel by RT-PCR (RSV, Flu A&B, Covid) Anterior Nasal Swab     Status: None   Collection Time: 11/01/22  8:12 PM   Specimen: Anterior Nasal Swab  Result Value Ref Range Status   SARS Coronavirus 2 by RT PCR NEGATIVE NEGATIVE Final   Influenza A by  PCR NEGATIVE NEGATIVE Final   Influenza B by PCR NEGATIVE NEGATIVE Final    Comment: (NOTE) The Xpert Xpress SARS-CoV-2/FLU/RSV plus assay is intended as an aid in the diagnosis of influenza from  Nasopharyngeal swab specimens and should not be used as a sole basis for treatment. Nasal washings and aspirates are unacceptable for Xpert Xpress SARS-CoV-2/FLU/RSV testing.  Fact Sheet for Patients: EntrepreneurPulse.com.au  Fact Sheet for Healthcare Providers: IncredibleEmployment.be  This test is not yet approved or cleared by the [place] FDA and has been authorized for detection and/or diagnosis of SARS-CoV-2 by FDA under an Emergency Use Authorization (EUA). This EUA will remain in effect (meaning this test can be used) for the duration of the COVID-19 declaration under Section 564(b)(1) of the Act, 21 U.S.C. section 360bbb-3(b)(1), unless the authorization is terminated or revoked.     Resp Syncytial Virus by PCR NEGATIVE NEGATIVE Final    Comment: (NOTE) Fact Sheet for Patients: EntrepreneurPulse.com.au  Fact Sheet for Healthcare Providers: IncredibleEmployment.be  This test is not yet approved or cleared by the [place] FDA and has been authorized for detection and/or diagnosis of SARS-CoV-2 by FDA under an Emergency Use Authorization (EUA). This EUA will remain in effect (meaning this test can be used) for the duration of the COVID-19 declaration under Section 564(b)(1) of the Act, 21 U.S.C. section 360bbb-3(b)(1), unless the authorization is terminated or revoked.  Performed at [facility], [place] [address], [place], [place] [address]     RADIOLOGY STUDIES/RESULTS: No results found.   LOS: 9 days   Oren Binet, MD  [facility]    To contact the attending provider between 7A-7P or the covering provider during after hours 7P-7A, please log into the web site [email or web address] and access using universal [place] password for that web site. If you do not have the password, please call the hospital operator.  11/11/2022, 9:41 AM

## 2022-11-11 NOTE — TOC Progression Note (Signed)
Transition of Care [facility]) - Progression Note    Patient Details  Name: Fred [name] MRN: [id number] Date of Birth: 1951-06-29  [facility] CM/SW Contact  Levonne Lapping, RN Phone Number: 11/11/2022, 8:11 AM  Clinical Narrative:     Patient DC order to remain per Provider. Sister was to appeal thru Medicare. Family home unable to care for Foley at this time. Will remain until foley removed or family home able to accept and care for [facility]. TOC to follow up with [employer] at [facility] .     Expected Discharge Plan: Group Home Barriers to Discharge: Other (must enter comment) (Family Group home unable to accept with catheter.  May return when catheter is removed)  Expected Discharge Plan and Services         Expected Discharge Date: 11/10/22                                     Social Determinants of Health (SDOH) Interventions SDOH Screenings   Tobacco Use: Medium Risk (11/01/2022)    Readmission Risk Interventions     No data to display

## 2022-11-11 NOTE — Progress Notes (Signed)
Physical Therapy Treatment Patient Details Name: Fred [name] MRN: [id number] DOB: [birth year] Today's Date: 11/11/2022   History of Present Illness 72 y.o. male presents to [facility] hospital on 11/01/2022 with AMS, found to have UTI. PMH includes HTN, CKD III, DMII, BPH, intellectual disability.    PT Comments    Pt tolerated today's session well. Ambulating with minG and RW, correcting gait with verbal cues but going back to his gait pattern which is likely his baseline. Returned for 10 minutes after pt ate lunch for stair trial, time added on to the end of the session's time. Pt performing well with stair trial, utilizing B rails ascending, and 1 rail descending, step-to and intermittent alternating pattern with descent, minA provided for balance but pt without LOB. Pt provided seated rest break after stair trial due to reports of mild fatigue, but recovering well. Acute PT will continue to follow up with pt, discharge plan remains appropriate.    Recommendations for follow up therapy are one component of a multi-disciplinary discharge planning process, led by the attending physician.  Recommendations may be updated based on patient status, additional functional criteria and insurance authorization.  Follow Up Recommendations  No PT follow up     Assistance Recommended at Discharge Frequent or constant Supervision/Assistance  Patient can return home with the following A little help with bathing/dressing/bathroom;Assistance with cooking/housework;Assistance with feeding;Direct supervision/assist for medications management;Direct supervision/assist for financial management;Assist for transportation;Help with stairs or ramp for entrance   Equipment Recommendations  Rolling walker (2 wheels)    Recommendations for Other Services       Precautions / Restrictions Precautions Precautions: Fall Restrictions Weight Bearing Restrictions: No     Mobility  Bed Mobility Overal bed mobility:  Modified Independent             General bed mobility comments: pt long sitting in bed but pivoting in/out of bed with modI and use of bed rail    Transfers Overall transfer level: Needs assistance Equipment used: Rolling walker (2 wheels) Transfers: Sit to/from Stand Sit to Stand: Min guard           General transfer comment: minG for safety    Ambulation/Gait Ambulation/Gait assistance: Min guard Gait Distance (Feet): 200 Feet (x200') Assistive device: Rolling walker (2 wheels) Gait Pattern/deviations: Trunk flexed, Drifts right/left, Wide base of support, Shuffle, Step-through pattern Gait velocity: decreased     General Gait Details: increased trunk flexion and ambualting at an increased distance behind RW, pt able to correct with cues but returns to same gait pattern, most likely baseline. Cueing provided for pt to check in for fatigue, brief standing rest break during first trial. Second trial after pt ate lunch for stair assessment   Stairs Stairs: Yes Stairs assistance: Min assist, Min guard Stair Management: Two rails, One rail Right, Alternating pattern, Step to pattern, Forwards Number of Stairs: 12 General stair comments: pt ascending with alternating pattern and B rails with minG, pt only utilizing one rail to descend steps varying between step-to and alternating pattern with minA provided for balance. Attempted to get pt to utilize second rail but pt preferring just one   Wheelchair Mobility    Modified Rankin (Stroke Patients Only)       Balance Overall balance assessment: Needs assistance Sitting-balance support: No upper extremity supported, Feet supported Sitting balance-Leahy Scale: Good     Standing balance support: During functional activity, Bilateral upper extremity supported Standing balance-Leahy Scale: Fair  Cognition Arousal/Alertness: Awake/alert Behavior During Therapy: WFL for tasks  assessed/performed Overall Cognitive Status: History of cognitive impairments - at baseline                                 General Comments: Pt pleasant throughout session and follows simple commands and verbal cues        Exercises Other Exercises Other Exercises: sit<>stand x5 reps    General Comments General comments (skin integrity, edema, etc.): VSS on room air, pt with seated rest breaks before and after stair trial, pt reporting mild fatigue but recovers well      Pertinent Vitals/Pain Pain Assessment Pain Assessment: Faces Faces Pain Scale: No hurt    Home Living                          Prior Function            PT Goals (current goals can now be found in the care plan section) Acute Rehab PT Goals Patient Stated Goal: patient unable to state, PT goal to return to group home PT Goal Formulation: Patient unable to participate in goal setting Time For Goal Achievement: 11/21/22 Potential to Achieve Goals: Good Progress towards PT goals: Progressing toward goals    Frequency    Min 3X/week      PT Plan Current plan remains appropriate    Co-evaluation              AM-PAC PT "6 Clicks" Mobility   Outcome Measure  Help needed turning from your back to your side while in a flat bed without using bedrails?: None Help needed moving from lying on your back to sitting on the side of a flat bed without using bedrails?: None Help needed moving to and from a bed to a chair (including a wheelchair)?: A Little Help needed standing up from a chair using your arms (e.g., wheelchair or bedside chair)?: A Little Help needed to walk in hospital room?: A Little Help needed climbing 3-5 steps with a railing? : A Little 6 Click Score: 20    End of Session Equipment Utilized During Treatment: Gait belt Activity Tolerance: Patient tolerated treatment well Patient left: in bed;with call bell/phone within reach;with bed alarm set Nurse  Communication: Mobility status PT Visit Diagnosis: Other abnormalities of gait and mobility (R26.89)     Time: [id number] PT Time Calculation (min) (ACUTE ONLY): 21 min  Charges:  $Gait Training: 8-22 mins                     Charlynne Cousins, PT DPT Acute Rehabilitation Services Office [phone]    Luvenia Heller 11/11/2022, 4:11 PM

## 2022-11-11 NOTE — TOC Transition Note (Signed)
Discharge medications (1) are being stored in the main pharmacy on the ground floor until patient is ready for discharge.   

## 2022-11-12 DIAGNOSIS — N3000 Acute cystitis without hematuria: Secondary | ICD-10-CM | POA: Diagnosis not present

## 2022-11-12 DIAGNOSIS — N138 Other obstructive and reflux uropathy: Secondary | ICD-10-CM | POA: Diagnosis not present

## 2022-11-12 DIAGNOSIS — N401 Enlarged prostate with lower urinary tract symptoms: Secondary | ICD-10-CM | POA: Diagnosis not present

## 2022-11-12 LAB — GLUCOSE, CAPILLARY
Glucose-Capillary: 112 mg/dL — ABNORMAL HIGH (ref 70–99)
Glucose-Capillary: 148 mg/dL — ABNORMAL HIGH (ref 70–99)
Glucose-Capillary: 147 mg/dL — ABNORMAL HIGH (ref 70–99)
Glucose-Capillary: 273 mg/dL — ABNORMAL HIGH (ref 70–99)

## 2022-11-12 NOTE — Progress Notes (Signed)
PROGRESS NOTE        PATIENT DETAILS Name: Fred [name] Age: 72 y.o. Sex: male Date of Birth: May 25, 1951 Admit Date: 11/01/2022 Admitting Physician Kristopher Oppenheim, DO [id number], [facility]  Brief Summary: Patient is a 72 y.o.  male with history of intellectual disability, CKD stage IIIa, HTN, DM-2, BPH who presented with acute metabolic encephalopathy in the setting of UTI, and acute urinary retention.  Significant events: 3/5>> admit to [facility] 3/14>> voiding trial failed-400 cc of urine on bladder ultrasound-patient dribbling-Foley placed-1000 cc urine drained.  Discharged to group home but family filed appeal.  Significant studies: 3/5>> CXR: No acute findings 3/5>> CT head: No acute intracranial process 3/10>> renal ultrasound: No hydronephrosis  Significant microbiology data: 3/5>> COVID/influenza/RSV PCR: Negative 3/5>> blood culture: No growth  Procedures: None  Consults: None  Subjective: No major issues-lying comfortably in bed.  Remains medically stable for discharge-no complaints this morning.  Objective: Vitals: Blood pressure 132/77, pulse 85, temperature 98.4 F (36.9 C), temperature source Axillary, resp. rate 18, height 5\' 8"  (1.727 m), weight 62.4 kg, SpO2 96 %.   Exam: Gen Exam:not in any distress HEENT:atraumatic, normocephalic Chest: B/L clear to auscultation anteriorly CVS:S1S2 regular Abdomen:soft non tender, non distended Extremities:no edema Neurology: Non focal Skin: no rash  Pertinent Labs/Radiology:    Latest Ref Rng & Units 11/06/2022    5:58 AM 11/04/2022    8:34 AM 11/03/2022    6:10 AM  CBC  WBC 4.0 - 10.5 K/uL 7.9  9.5  12.2   Hemoglobin 13.0 - 17.0 g/dL 10.7  11.6  10.8   Hematocrit 39.0 - 52.0 % 32.8  35.7  33.9   Platelets 150 - 400 K/uL 184  167  136     Lab Results  Component Value Date   NA 142 11/09/2022   K 3.6 11/09/2022   CL 110 11/09/2022   CO2 24 11/09/2022       Assessment/Plan: Acute metabolic encephalopathy secondary to UTI Encephalopathy improved-back to baseline Completed a course of 7 days of IV Rocephin  Acute urinary retention Known history of BPH Failed voiding trial on 3/14-Foley reinserted-1000 cc of urine drained immediately Continue alfuzosin/finasteride Will need outpatient voiding trial-follows with urology at [facility]  CKD stage IIIa At baseline  HTN BP stable Continue amlodipine/Coreg  HLD Statin  DM-2 Resume metformin on discharge CBG stable with SSI  Recent Labs    11/11/22 1545 11/11/22 2115 11/12/22 0729  GLUCAP 105* 152* 112*     Intellectual disability At baseline Continue risperidone/benztropine/Zoloft  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  BMI: Estimated body mass index is 20.92 kg/m as calculated from the following:   Height as of this encounter: 5\' 8"  (1.727 m).   Weight as of this encounter: 62.4 kg.   Code status:   Code Status: DNR   DVT Prophylaxis: heparin injection 5,000 Units Start: 11/02/22 0600 SCDs Start: 11/02/22 0551    Family Communication: Adela Glimpse [phone], Sister-Cheryl Owens Shark  [phone] updated over the phone 3/13   Disposition Plan: Status is: Inpatient Remains inpatient appropriate because: Remains medically stable for discharge-family has filed appeal.   Planned Discharge Destination: Group Home   Diet: Diet Order             Diet -  low sodium heart healthy           Diet heart healthy/carb modified Room service appropriate? No; Fluid consistency: Thin  Diet effective now                     Antimicrobial agents: Anti-infectives (From admission, onward)    Start     Dose/Rate Route Frequency Ordered Stop   11/02/22 2000  vancomycin (VANCOREADY) IVPB 750 mg/150 mL  Status:  Discontinued        750 mg 150 mL/hr over 60 Minutes  Intravenous Every 24 hours 11/01/22 2050 11/02/22 0058   11/02/22 1000  cefTRIAXone (ROCEPHIN) 1 g in sodium chloride 0.9 % 100 mL IVPB  Status:  Discontinued        1 g 200 mL/hr over 30 Minutes Intravenous Every 24 hours 11/02/22 0058 11/07/22 1600   11/02/22 0600  ceFEPIme (MAXIPIME) 2 g in sodium chloride 0.9 % 100 mL IVPB  Status:  Discontinued        2 g 200 mL/hr over 30 Minutes Intravenous Every 12 hours 11/01/22 2050 11/02/22 0058   11/01/22 1915  ceFEPIme (MAXIPIME) 2 g in sodium chloride 0.9 % 100 mL IVPB        2 g 200 mL/hr over 30 Minutes Intravenous  Once 11/01/22 1913 11/01/22 2005   11/01/22 1915  metroNIDAZOLE (FLAGYL) IVPB 500 mg        500 mg 100 mL/hr over 60 Minutes Intravenous  Once 11/01/22 1913 11/01/22 2048   11/01/22 1915  vancomycin (VANCOCIN) IVPB 1000 mg/200 mL premix        1,000 mg 200 mL/hr over 60 Minutes Intravenous  Once 11/01/22 1913 11/01/22 2109        MEDICATIONS: Scheduled Meds:  alfuzosin  10 mg Oral QPC breakfast   amLODipine  10 mg Oral Daily   aspirin EC  81 mg Oral Daily   atorvastatin  20 mg Oral Daily   benztropine  0.5 mg Oral Daily   carvedilol  12.5 mg Oral BID WC   Chlorhexidine Gluconate Cloth  6 each Topical Daily   finasteride  5 mg Oral Daily   heparin  5,000 Units Subcutaneous Q8H   insulin aspart  0-5 Units Subcutaneous QHS   insulin aspart  0-9 Units Subcutaneous TID WC   risperiDONE  0.5 mg Oral Daily   risperiDONE  3 mg Oral QHS   sertraline  50 mg Oral Daily   Continuous Infusions: PRN Meds:.acetaminophen **OR** acetaminophen, ondansetron **OR** ondansetron (ZOFRAN) IV   I have personally reviewed following labs and imaging studies  LABORATORY DATA: CBC: Recent Labs  Lab 11/06/22 0558  WBC 7.9  NEUTROABS 5.7  HGB 10.7*  HCT 32.8*  MCV 91.9  PLT 184     Basic Metabolic Panel: Recent Labs  Lab 11/06/22 0558 11/07/22 0745 11/09/22 0319  NA 148* 146* 142  K 3.3* 3.3* 3.6  CL 118* 113* 110  CO2  21* 24 24  GLUCOSE 114* 122* 86  BUN 28* 18 24*  CREATININE 1.48* 1.14 0.99  CALCIUM 8.8* 8.7* 8.4*  MG 2.1 1.7 1.8  PHOS 3.4  --   --      GFR: Estimated Creatinine Clearance: 59.5 mL/min (by C-G formula based on SCr of 0.99 mg/dL).  Liver Function Tests: Recent Labs  Lab 11/06/22 0558  ALBUMIN 2.6*    No results for input(s): "LIPASE", "AMYLASE" in the last 168 hours. No results for input(s): "AMMONIA" in the  last 168 hours.  Coagulation Profile: No results for input(s): "INR", "PROTIME" in the last 168 hours.  Cardiac Enzymes: No results for input(s): "CKTOTAL", "CKMB", "CKMBINDEX", "TROPONINI" in the last 168 hours.  BNP (last 3 results) No results for input(s): "PROBNP" in the last 8760 hours.  Lipid Profile: No results for input(s): "CHOL", "HDL", "LDLCALC", "TRIG", "CHOLHDL", "LDLDIRECT" in the last 72 hours.  Thyroid Function Tests: No results for input(s): "TSH", "T4TOTAL", "FREET4", "T3FREE", "THYROIDAB" in the last 72 hours.  Anemia Panel: No results for input(s): "VITAMINB12", "FOLATE", "FERRITIN", "TIBC", "IRON", "RETICCTPCT" in the last 72 hours.  Urine analysis:    Component Value Date/Time   COLORURINE STRAW (A) 11/06/2022 0644   APPEARANCEUR CLEAR 11/06/2022 0644   LABSPEC 1.005 11/06/2022 0644   PHURINE 5.0 11/06/2022 0644   GLUCOSEU NEGATIVE 11/06/2022 0644   HGBUR LARGE (A) 11/06/2022 0644   BILIRUBINUR NEGATIVE 11/06/2022 0644   KETONESUR NEGATIVE 11/06/2022 0644   PROTEINUR 30 (A) 11/06/2022 0644   UROBILINOGEN 1.0 02/27/2012 1053   NITRITE NEGATIVE 11/06/2022 0644   LEUKOCYTESUR NEGATIVE 11/06/2022 0644    Sepsis Labs: Lactic Acid, Venous    Component Value Date/Time   LATICACIDVEN 1.5 11/01/2022 2146    MICROBIOLOGY: No results found for this or any previous visit (from the past 240 hour(s)).   RADIOLOGY STUDIES/RESULTS: No results found.   LOS: 10 days   Oren Binet, MD  [facility]    To contact the  attending provider between 7A-7P or the covering provider during after hours 7P-7A, please log into the web site [email or web address] and access using universal [place] password for that web site. If you do not have the password, please call the hospital operator.  11/12/2022, 10:58 AM

## 2022-11-12 NOTE — TOC Progression Note (Addendum)
Transition of Care [facility]) - Progression Note    Patient Details  Name: Fred [name] MRN: [id number] Date of Birth: [birth year]  Transition of Care [facility]) CM/SW Contact  Carles Collet, RN Phone Number: 11/12/2022, 1:33 PM  Clinical Narrative:     13:30 Follow up on appeal for DC order written 11/10/22.  Per TOC handoff 3/14 "2:46 PM Sister (Guardian) is appealing DC" No documentation for appeal charted. Appeal must be placed on day DC order is written. It does not appear that appeal was either initiated by family, or followed up on by TOC. One cannot be filed at this time.   Barrier to DC remains that the [facility] will not accept patient back with a foley catheter.   13:40 Vaughan Sine [id number] comes across [place]. I spoke w Judithann Graves advisor who states that although appeal was not filed on day of DC, we must submit all [employer] forms regardless.  14:25 Reviewed DND and HINN 12 with patient's sister [name] at bedside.  Uploaded appeal to [place] site: CONFIRMATION # [id number]    Expected Discharge Plan: Group Home Barriers to Discharge: Other (must enter comment) (Family Group home unable to accept with catheter.  May return when catheter is removed)  Expected Discharge Plan and Services         Expected Discharge Date: 11/10/22                                     Social Determinants of Health (SDOH) Interventions SDOH Screenings   Tobacco Use: Medium Risk (11/01/2022)    Readmission Risk Interventions     No data to display

## 2022-11-13 DIAGNOSIS — N3000 Acute cystitis without hematuria: Secondary | ICD-10-CM | POA: Diagnosis not present

## 2022-11-13 DIAGNOSIS — N138 Other obstructive and reflux uropathy: Secondary | ICD-10-CM | POA: Diagnosis not present

## 2022-11-13 DIAGNOSIS — N401 Enlarged prostate with lower urinary tract symptoms: Secondary | ICD-10-CM | POA: Diagnosis not present

## 2022-11-13 LAB — GLUCOSE, CAPILLARY
Glucose-Capillary: 129 mg/dL — ABNORMAL HIGH (ref 70–99)
Glucose-Capillary: 130 mg/dL — ABNORMAL HIGH (ref 70–99)
Glucose-Capillary: 200 mg/dL — ABNORMAL HIGH (ref 70–99)
Glucose-Capillary: 159 mg/dL — ABNORMAL HIGH (ref 70–99)

## 2022-11-13 NOTE — Progress Notes (Signed)
PROGRESS NOTE        PATIENT DETAILS Name: Fred [name] Age: 72 y.o. Sex: male Date of Birth: December 11, 1950 Admit Date: 11/01/2022 Admitting Physician Kristopher Oppenheim, DO [id number], [facility]  Brief Summary: Patient is a 72 y.o.  male with history of intellectual disability, CKD stage IIIa, HTN, DM-2, BPH who presented with acute metabolic encephalopathy in the setting of UTI, and acute urinary retention.  Significant events: 3/5>> admit to [facility] 3/14>> voiding trial failed-400 cc of urine on bladder ultrasound-patient dribbling-Foley placed-1000 cc urine drained.  Discharged to group home but family filed appeal-Home unable to take patient with Foley as staff is not trained.  Significant studies: 3/5>> CXR: No acute findings 3/5>> CT head: No acute intracranial process 3/10>> renal ultrasound: No hydronephrosis  Significant microbiology data: 3/5>> COVID/influenza/RSV PCR: Negative 3/5>> blood culture: No growth  Procedures: None  Consults: None  Subjective: No major issues overnight-lying comfortably in bed.  Objective: Vitals: Blood pressure (!) 145/87, pulse 72, temperature 97.6 F (36.4 C), temperature source Oral, resp. rate 18, height 5\' 8"  (1.727 m), weight 61.8 kg, SpO2 98 %.   Exam: Gen Exam:not in any distress HEENT:atraumatic, normocephalic Chest: B/L clear to auscultation anteriorly CVS:S1S2 regular Abdomen:soft non tender, non distended Extremities:no edema Neurology: Non focal Skin: no rash  Pertinent Labs/Radiology:    Latest Ref Rng & Units 11/06/2022    5:58 AM 11/04/2022    8:34 AM 11/03/2022    6:10 AM  CBC  WBC 4.0 - 10.5 K/uL 7.9  9.5  12.2   Hemoglobin 13.0 - 17.0 g/dL 10.7  11.6  10.8   Hematocrit 39.0 - 52.0 % 32.8  35.7  33.9   Platelets 150 - 400 K/uL 184  167  136     Lab Results  Component Value Date   NA 142 11/09/2022   K 3.6 11/09/2022   CL 110 11/09/2022   CO2 24 11/09/2022       Assessment/Plan: Acute metabolic encephalopathy secondary to UTI Encephalopathy improved-back to baseline Completed a course of 7 days of IV Rocephin  Acute urinary retention Known history of BPH Failed voiding trial on 3/14-Foley reinserted-1000 cc of urine drained immediately Continue alfuzosin/finasteride Will need outpatient voiding trial-follows with urology at [facility]  CKD stage IIIa At baseline  HTN BP stable Continue amlodipine/Coreg  HLD Statin  DM-2 Resume metformin on discharge CBG stable with SSI  Recent Labs    11/12/22 1557 11/12/22 1940 11/13/22 0729  GLUCAP 147* 273* 129*     Intellectual disability At baseline Continue risperidone/benztropine/Zoloft  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  BMI: Estimated body mass index is 20.72 kg/m as calculated from the following:   Height as of this encounter: 5\' 8"  (1.727 m).   Weight as of this encounter: 61.8 kg.   Code status:   Code Status: DNR   DVT Prophylaxis: heparin injection 5,000 Units Start: 11/02/22 0600 SCDs Start: 11/02/22 0551    Family Communication: Adela Glimpse [phone], Sister-Cheryl Owens Shark  [phone] updated over the phone 3/13   Disposition Plan: Status is: Inpatient Remains inpatient appropriate because: Remains medically stable for discharge-family has filed appeal.   Planned Discharge Destination: Group Home   Diet: Diet Order  Diet - low sodium heart healthy           Diet heart healthy/carb modified Room service appropriate? No; Fluid consistency: Thin  Diet effective now                     Antimicrobial agents: Anti-infectives (From admission, onward)    Start     Dose/Rate Route Frequency Ordered Stop   11/02/22 2000  vancomycin (VANCOREADY) IVPB 750 mg/150 mL  Status:  Discontinued        750 mg 150 mL/hr over 60 Minutes  Intravenous Every 24 hours 11/01/22 2050 11/02/22 0058   11/02/22 1000  cefTRIAXone (ROCEPHIN) 1 g in sodium chloride 0.9 % 100 mL IVPB  Status:  Discontinued        1 g 200 mL/hr over 30 Minutes Intravenous Every 24 hours 11/02/22 0058 11/07/22 1600   11/02/22 0600  ceFEPIme (MAXIPIME) 2 g in sodium chloride 0.9 % 100 mL IVPB  Status:  Discontinued        2 g 200 mL/hr over 30 Minutes Intravenous Every 12 hours 11/01/22 2050 11/02/22 0058   11/01/22 1915  ceFEPIme (MAXIPIME) 2 g in sodium chloride 0.9 % 100 mL IVPB        2 g 200 mL/hr over 30 Minutes Intravenous  Once 11/01/22 1913 11/01/22 2005   11/01/22 1915  metroNIDAZOLE (FLAGYL) IVPB 500 mg        500 mg 100 mL/hr over 60 Minutes Intravenous  Once 11/01/22 1913 11/01/22 2048   11/01/22 1915  vancomycin (VANCOCIN) IVPB 1000 mg/200 mL premix        1,000 mg 200 mL/hr over 60 Minutes Intravenous  Once 11/01/22 1913 11/01/22 2109        MEDICATIONS: Scheduled Meds:  alfuzosin  10 mg Oral QPC breakfast   amLODipine  10 mg Oral Daily   aspirin EC  81 mg Oral Daily   atorvastatin  20 mg Oral Daily   benztropine  0.5 mg Oral Daily   carvedilol  12.5 mg Oral BID WC   Chlorhexidine Gluconate Cloth  6 each Topical Daily   finasteride  5 mg Oral Daily   heparin  5,000 Units Subcutaneous Q8H   insulin aspart  0-5 Units Subcutaneous QHS   insulin aspart  0-9 Units Subcutaneous TID WC   risperiDONE  0.5 mg Oral Daily   risperiDONE  3 mg Oral QHS   sertraline  50 mg Oral Daily   Continuous Infusions: PRN Meds:.acetaminophen **OR** acetaminophen, ondansetron **OR** ondansetron (ZOFRAN) IV   I have personally reviewed following labs and imaging studies  LABORATORY DATA: CBC: No results for input(s): "WBC", "NEUTROABS", "HGB", "HCT", "MCV", "PLT" in the last 168 hours.   Basic Metabolic Panel: Recent Labs  Lab 11/07/22 0745 11/09/22 0319  NA 146* 142  K 3.3* 3.6  CL 113* 110  CO2 24 24  GLUCOSE 122* 86  BUN 18 24*   CREATININE 1.14 0.99  CALCIUM 8.7* 8.4*  MG 1.7 1.8     GFR: Estimated Creatinine Clearance: 59 mL/min (by C-G formula based on SCr of 0.99 mg/dL).  Liver Function Tests: No results for input(s): "AST", "ALT", "ALKPHOS", "BILITOT", "PROT", "ALBUMIN" in the last 168 hours.  No results for input(s): "LIPASE", "AMYLASE" in the last 168 hours. No results for input(s): "AMMONIA" in the last 168 hours.  Coagulation Profile: No results for input(s): "INR", "PROTIME" in the last 168 hours.  Cardiac Enzymes: No results for input(s): "CKTOTAL", "  CKMB", "CKMBINDEX", "TROPONINI" in the last 168 hours.  BNP (last 3 results) No results for input(s): "PROBNP" in the last 8760 hours.  Lipid Profile: No results for input(s): "CHOL", "HDL", "LDLCALC", "TRIG", "CHOLHDL", "LDLDIRECT" in the last 72 hours.  Thyroid Function Tests: No results for input(s): "TSH", "T4TOTAL", "FREET4", "T3FREE", "THYROIDAB" in the last 72 hours.  Anemia Panel: No results for input(s): "VITAMINB12", "FOLATE", "FERRITIN", "TIBC", "IRON", "RETICCTPCT" in the last 72 hours.  Urine analysis:    Component Value Date/Time   COLORURINE STRAW (A) 11/06/2022 0644   APPEARANCEUR CLEAR 11/06/2022 0644   LABSPEC 1.005 11/06/2022 0644   PHURINE 5.0 11/06/2022 0644   GLUCOSEU NEGATIVE 11/06/2022 0644   HGBUR LARGE (A) 11/06/2022 0644   BILIRUBINUR NEGATIVE 11/06/2022 0644   KETONESUR NEGATIVE 11/06/2022 0644   PROTEINUR 30 (A) 11/06/2022 0644   UROBILINOGEN 1.0 02/27/2012 1053   NITRITE NEGATIVE 11/06/2022 0644   LEUKOCYTESUR NEGATIVE 11/06/2022 0644    Sepsis Labs: Lactic Acid, Venous    Component Value Date/Time   LATICACIDVEN 1.5 11/01/2022 2146    MICROBIOLOGY: No results found for this or any previous visit (from the past 240 hour(s)).   RADIOLOGY STUDIES/RESULTS: No results found.   LOS: 11 days   Oren Binet, MD  [facility]    To contact the attending provider between 7A-7P or the  covering provider during after hours 7P-7A, please log into the web site [email or web address] and access using universal [place] password for that web site. If you do not have the password, please call the hospital operator.  11/13/2022, 9:42 AM

## 2022-11-13 NOTE — Care Management (Addendum)
From [employer], Patient will assume financial liability starting Monday 3/18 at noon.  Attanding, Yehuda Savannah, notified. Sister, [name], notified by phone.    Overall Status The Physician has made a decision on the notice.  1. Discharge Appeal Started 11/12/2022 11:08:18  2. Medical Record Requested 11/12/2022 13:40:51  3. Documents Received 11/12/2022 14:50:14  4. In Clinical Review 11/12/2022 16:55:05  5. Physician Review Completed 11/13/2022 11:44:41  6. Financial Liability Determined The Physician agreed with the notice. Beneficiary Liability Starts on 11/14/2022.

## 2022-11-14 DIAGNOSIS — N3000 Acute cystitis without hematuria: Secondary | ICD-10-CM | POA: Diagnosis not present

## 2022-11-14 DIAGNOSIS — N401 Enlarged prostate with lower urinary tract symptoms: Secondary | ICD-10-CM | POA: Diagnosis not present

## 2022-11-14 DIAGNOSIS — N138 Other obstructive and reflux uropathy: Secondary | ICD-10-CM | POA: Diagnosis not present

## 2022-11-14 LAB — GLUCOSE, CAPILLARY
Glucose-Capillary: 150 mg/dL — ABNORMAL HIGH (ref 70–99)
Glucose-Capillary: 75 mg/dL (ref 70–99)
Glucose-Capillary: 82 mg/dL (ref 70–99)
Glucose-Capillary: 177 mg/dL — ABNORMAL HIGH (ref 70–99)

## 2022-11-14 NOTE — TOC Progression Note (Signed)
Transition of Care [facility]) - Progression Note    Patient Details  Name: Fred [name] MRN: [id number] Date of Birth: [birth year]  [facility] CM/SW Contact  Levonne Lapping, RN Phone Number: 11/14/2022, 10:30 AM  Clinical Narrative:     Rolling walker ordered per recommendation     Expected Discharge Plan: Group Home Barriers to Discharge: Other (must enter comment) (Family Group home unable to accept with catheter.  May return when catheter is removed)  Expected Discharge Plan and Services         Expected Discharge Date: 11/10/22                                     Social Determinants of Health (SDOH) Interventions SDOH Screenings   Tobacco Use: Medium Risk (11/01/2022)    Readmission Risk Interventions     No data to display

## 2022-11-14 NOTE — Progress Notes (Signed)
PROGRESS NOTE        PATIENT DETAILS Name: Fred [name] Age: 72 y.o. Sex: male Date of Birth: [birth year] Admit Date: 11/01/2022 Admitting Physician Kristopher Oppenheim, DO [id number], [facility]  Brief Summary: Patient is a 72 y.o.  male with history of intellectual disability, CKD stage IIIa, HTN, DM-2, BPH who presented with acute metabolic encephalopathy in the setting of UTI, and acute urinary retention.  Significant events: 3/5>> admit to [facility] 3/14>> voiding trial failed-400 cc of urine on bladder ultrasound-patient dribbling-Foley placed-1000 cc urine drained.  Discharged to group home but family filed appeal-Home unable to take patient with Foley as staff is not trained.  Significant studies: 3/5>> CXR: No acute findings 3/5>> CT head: No acute intracranial process 3/10>> renal ultrasound: No hydronephrosis  Significant microbiology data: 3/5>> COVID/influenza/RSV PCR: Negative 3/5>> blood culture: No growth  Procedures: None  Consults: None  Subjective: No major issues overnight  Objective: Vitals: Blood pressure 132/77, pulse 89, temperature 97.8 F (36.6 C), temperature source Oral, resp. rate 18, height 5\' 8"  (1.727 m), weight 61.8 kg, SpO2 95 %.   Exam: Gen Exam:not in any distress HEENT:atraumatic, normocephalic Chest: B/L clear to auscultation anteriorly CVS:S1S2 regular Abdomen:soft non tender, non distended Extremities:no edema Neurology: Non focal Skin: no rash  Pertinent Labs/Radiology:    Latest Ref Rng & Units 11/06/2022    5:58 AM 11/04/2022    8:34 AM 11/03/2022    6:10 AM  CBC  WBC 4.0 - 10.5 K/uL 7.9  9.5  12.2   Hemoglobin 13.0 - 17.0 g/dL 10.7  11.6  10.8   Hematocrit 39.0 - 52.0 % 32.8  35.7  33.9   Platelets 150 - 400 K/uL 184  167  136     Lab Results  Component Value Date   NA 142 11/09/2022   K 3.6 11/09/2022   CL 110 11/09/2022   CO2 24 11/09/2022      Assessment/Plan: Acute  metabolic encephalopathy secondary to UTI Encephalopathy improved-back to baseline Completed a course of 7 days of IV Rocephin  Acute urinary retention Known history of BPH Failed voiding trial on 3/14-Foley reinserted-1000 cc of urine drained immediately Continue alfuzosin/finasteride Will need outpatient voiding trial-follows with urology at [facility]  CKD stage IIIa At baseline  HTN BP stable Continue amlodipine/Coreg  HLD Statin  DM-2 Resume metformin on discharge CBG stable with SSI  Recent Labs    11/13/22 1605 11/13/22 2042 11/14/22 0827  GLUCAP 200* 159* 150*     Intellectual disability At baseline Continue risperidone/benztropine/Zoloft  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  BMI: Estimated body mass index is 20.72 kg/m as calculated from the following:   Height as of this encounter: 5\' 8"  (1.727 m).   Weight as of this encounter: 61.8 kg.   Code status:   Code Status: DNR   DVT Prophylaxis: heparin injection 5,000 Units Start: 11/02/22 0600 SCDs Start: 11/02/22 0551    Family Communication: Adela Glimpse [phone], Sister-Cheryl Owens Shark  [phone] updated over the phone 3/13   Disposition Plan: Status is: Inpatient Remains inpatient appropriate because: Remains medically stable for discharge-once group home able to take patient back with Foley catheter.   Planned Discharge Destination: Group Home   Diet: Diet Order  Diet - low sodium heart healthy           Diet heart healthy/carb modified Room service appropriate? No; Fluid consistency: Thin  Diet effective now                     Antimicrobial agents: Anti-infectives (From admission, onward)    Start     Dose/Rate Route Frequency Ordered Stop   11/02/22 2000  vancomycin (VANCOREADY) IVPB 750 mg/150 mL  Status:  Discontinued        750 mg 150 mL/hr over 60 Minutes  Intravenous Every 24 hours 11/01/22 2050 11/02/22 0058   11/02/22 1000  cefTRIAXone (ROCEPHIN) 1 g in sodium chloride 0.9 % 100 mL IVPB  Status:  Discontinued        1 g 200 mL/hr over 30 Minutes Intravenous Every 24 hours 11/02/22 0058 11/07/22 1600   11/02/22 0600  ceFEPIme (MAXIPIME) 2 g in sodium chloride 0.9 % 100 mL IVPB  Status:  Discontinued        2 g 200 mL/hr over 30 Minutes Intravenous Every 12 hours 11/01/22 2050 11/02/22 0058   11/01/22 1915  ceFEPIme (MAXIPIME) 2 g in sodium chloride 0.9 % 100 mL IVPB        2 g 200 mL/hr over 30 Minutes Intravenous  Once 11/01/22 1913 11/01/22 2005   11/01/22 1915  metroNIDAZOLE (FLAGYL) IVPB 500 mg        500 mg 100 mL/hr over 60 Minutes Intravenous  Once 11/01/22 1913 11/01/22 2048   11/01/22 1915  vancomycin (VANCOCIN) IVPB 1000 mg/200 mL premix        1,000 mg 200 mL/hr over 60 Minutes Intravenous  Once 11/01/22 1913 11/01/22 2109        MEDICATIONS: Scheduled Meds:  alfuzosin  10 mg Oral QPC breakfast   amLODipine  10 mg Oral Daily   aspirin EC  81 mg Oral Daily   atorvastatin  20 mg Oral Daily   benztropine  0.5 mg Oral Daily   carvedilol  12.5 mg Oral BID WC   Chlorhexidine Gluconate Cloth  6 each Topical Daily   finasteride  5 mg Oral Daily   heparin  5,000 Units Subcutaneous Q8H   insulin aspart  0-5 Units Subcutaneous QHS   insulin aspart  0-9 Units Subcutaneous TID WC   risperiDONE  0.5 mg Oral Daily   risperiDONE  3 mg Oral QHS   sertraline  50 mg Oral Daily   Continuous Infusions: PRN Meds:.acetaminophen **OR** acetaminophen, ondansetron **OR** ondansetron (ZOFRAN) IV   I have personally reviewed following labs and imaging studies  LABORATORY DATA: CBC: No results for input(s): "WBC", "NEUTROABS", "HGB", "HCT", "MCV", "PLT" in the last 168 hours.   Basic Metabolic Panel: Recent Labs  Lab 11/09/22 0319  NA 142  K 3.6  CL 110  CO2 24  GLUCOSE 86  BUN 24*  CREATININE 0.99  CALCIUM 8.4*  MG 1.8      GFR: Estimated Creatinine Clearance: 59 mL/min (by C-G formula based on SCr of 0.99 mg/dL).  Liver Function Tests: No results for input(s): "AST", "ALT", "ALKPHOS", "BILITOT", "PROT", "ALBUMIN" in the last 168 hours.  No results for input(s): "LIPASE", "AMYLASE" in the last 168 hours. No results for input(s): "AMMONIA" in the last 168 hours.  Coagulation Profile: No results for input(s): "INR", "PROTIME" in the last 168 hours.  Cardiac Enzymes: No results for input(s): "CKTOTAL", "CKMB", "CKMBINDEX", "TROPONINI" in the last 168 hours.  BNP (last  3 results) No results for input(s): "PROBNP" in the last 8760 hours.  Lipid Profile: No results for input(s): "CHOL", "HDL", "LDLCALC", "TRIG", "CHOLHDL", "LDLDIRECT" in the last 72 hours.  Thyroid Function Tests: No results for input(s): "TSH", "T4TOTAL", "FREET4", "T3FREE", "THYROIDAB" in the last 72 hours.  Anemia Panel: No results for input(s): "VITAMINB12", "FOLATE", "FERRITIN", "TIBC", "IRON", "RETICCTPCT" in the last 72 hours.  Urine analysis:    Component Value Date/Time   COLORURINE STRAW (A) 11/06/2022 0644   APPEARANCEUR CLEAR 11/06/2022 0644   LABSPEC 1.005 11/06/2022 0644   PHURINE 5.0 11/06/2022 0644   GLUCOSEU NEGATIVE 11/06/2022 0644   HGBUR LARGE (A) 11/06/2022 0644   BILIRUBINUR NEGATIVE 11/06/2022 0644   KETONESUR NEGATIVE 11/06/2022 0644   PROTEINUR 30 (A) 11/06/2022 0644   UROBILINOGEN 1.0 02/27/2012 1053   NITRITE NEGATIVE 11/06/2022 0644   LEUKOCYTESUR NEGATIVE 11/06/2022 0644    Sepsis Labs: Lactic Acid, Venous    Component Value Date/Time   LATICACIDVEN 1.5 11/01/2022 2146    MICROBIOLOGY: No results found for this or any previous visit (from the past 240 hour(s)).   RADIOLOGY STUDIES/RESULTS: No results found.   LOS: 12 days   Oren Binet, MD  [facility]    To contact the attending provider between 7A-7P or the covering provider during after hours 7P-7A, please log  into the web site [email or web address] and access using universal [place] password for that web site. If you do not have the password, please call the hospital operator.  11/14/2022, 9:10 AM

## 2022-11-14 NOTE — TOC Transition Note (Signed)
Discharge medications (1) have been retrieved from Main Pharmacy weekend lockup and are now being stored in the Transitions of Care (TOC) Pharmacy on the second floor until patient is ready for discharge.   

## 2022-11-14 NOTE — Progress Notes (Signed)
Physical Therapy Treatment Patient Details Name: Fred [name] MRN: [id number] DOB: Oct 20, 1950 Today's Date: 11/14/2022   History of Present Illness 72 y.o. male presents to [facility] on 11/01/2022 with AMS, found to have UTI. PMH includes HTN, CKD III, DMII, BPH, intellectual disability.    PT Comments    Pt received in bed, soiled with stool. Assisted to bathroom for further BM and pericare prior to amb in hallway. Pt demo mod I bed mobility. He required min guard assist transfers and amb 200' with RW. Pt returned to bed at end of session.    Recommendations for follow up therapy are one component of a multi-disciplinary discharge planning process, led by the attending physician.  Recommendations may be updated based on patient status, additional functional criteria and insurance authorization.  Follow Up Recommendations  No PT follow up     Assistance Recommended at Discharge Frequent or constant Supervision/Assistance  Patient can return home with the following A little help with bathing/dressing/bathroom;Assistance with cooking/housework;Assistance with feeding;Direct supervision/assist for medications management;Direct supervision/assist for financial management;Assist for transportation;Help with stairs or ramp for entrance   Equipment Recommendations  Rolling walker (2 wheels)    Recommendations for Other Services       Precautions / Restrictions Precautions Precautions: Fall     Mobility  Bed Mobility Overal bed mobility: Modified Independent                  Transfers Overall transfer level: Needs assistance Equipment used: Rolling walker (2 wheels) Transfers: Sit to/from Stand Sit to Stand: Min guard                Ambulation/Gait Ambulation/Gait assistance: Min guard Gait Distance (Feet): 200 Feet Assistive device: Rolling walker (2 wheels) Gait Pattern/deviations: Trunk flexed, Drifts right/left, Wide base of support, Shuffle, Step-through  pattern Gait velocity: decreased Gait velocity interpretation: <1.31 ft/sec, indicative of household ambulator   General Gait Details: cues to stay close to [employer]    Modified Rankin (Stroke Patients Only)       Balance Overall balance assessment: Needs assistance Sitting-balance support: No upper extremity supported, Feet supported Sitting balance-Leahy Scale: Good     Standing balance support: During functional activity, Bilateral upper extremity supported Standing balance-Leahy Scale: Fair Standing balance comment: RW for amb increased distances                            Cognition Arousal/Alertness: Awake/alert Behavior During Therapy: WFL for tasks assessed/performed, Flat affect Overall Cognitive Status: History of cognitive impairments - at baseline                                 General Comments: Pleasant and follows simple commands. Minimal verbalizations, primarily mumbling        Exercises      General Comments        Pertinent Vitals/Pain Pain Assessment Pain Assessment: Faces Faces Pain Scale: No hurt    Home Living                          Prior Function            PT Goals (current goals can now be found in the care plan section) Acute Rehab PT Goals Patient Stated Goal:  not stated Progress towards PT goals: Progressing toward goals    Frequency    Min 3X/week      PT Plan Current plan remains appropriate    Co-evaluation              AM-PAC PT "6 Clicks" Mobility   Outcome Measure  Help needed turning from your back to your side while in a flat bed without using bedrails?: None Help needed moving from lying on your back to sitting on the side of a flat bed without using bedrails?: None Help needed moving to and from a bed to a chair (including a wheelchair)?: A Little Help needed standing up from a chair using your arms (e.g., wheelchair  or bedside chair)?: A Little Help needed to walk in hospital room?: A Little Help needed climbing 3-5 steps with a railing? : A Little 6 Click Score: 20    End of Session Equipment Utilized During Treatment: Gait belt Activity Tolerance: Patient tolerated treatment well Patient left: in bed;with bed alarm set;with call bell/phone within reach Nurse Communication: Mobility status PT Visit Diagnosis: Other abnormalities of gait and mobility (R26.89)     Time: [id number] PT Time Calculation (min) (ACUTE ONLY): 24 min  Charges:  $Gait Training: 8-22 mins $Therapeutic Activity: 8-22 mins                     Gloriann Loan., PT  Office # [phone]    Lorriane Shire 11/14/2022, 9:12 AM

## 2022-11-14 NOTE — TOC Progression Note (Signed)
Transition of Care [facility]) - Progression Note    Patient Details  Name: Fred [name] MRN: [id number] Date of Birth: 04-10-1951  Transition of Care [facility]) CM/SW Contact  Levonne Lapping, RN Phone Number: 11/14/2022, 9:48 AM  Clinical Narrative:     Chart review this am. Judithann Graves appeal noted.  CM left VM for [employer] at [facility] to return call. CM following up on readiness of staff at [facility] home to care for Foley required for patient.  TOC will continue to follow patient for any additional discharge needs      Expected Discharge Plan: Group Home Barriers to Discharge: Other (must enter comment) (Family Group home unable to accept with catheter.  May return when catheter is removed)  Expected Discharge Plan and Services         Expected Discharge Date: 11/10/22                                     Social Determinants of Health (SDOH) Interventions SDOH Screenings   Tobacco Use: Medium Risk (11/01/2022)    Readmission Risk Interventions     No data to display

## 2022-11-14 NOTE — TOC Progression Note (Signed)
Transition of Care [facility]) - Progression Note    Patient Details  Name: Fred [name] MRN: [id number] Date of Birth: 12-Mar-1951  Transition of Care [facility]) CM/SW Contact  Levonne Lapping, RN Phone Number: 11/14/2022, 4:24 PM  Clinical Narrative:     CM received call from [employer] stating that Judithann Graves is "reconsidering" appeal that was originally declined.       Expected Discharge Plan: Group Home Barriers to Discharge: Other (must enter comment) (Family Group home unable to accept with catheter.  May return when catheter is removed)  Expected Discharge Plan and Services         Expected Discharge Date: 11/10/22                                     Social Determinants of Health (SDOH) Interventions SDOH Screenings   Tobacco Use: Medium Risk (11/01/2022)    Readmission Risk Interventions     No data to display

## 2022-11-15 DIAGNOSIS — N138 Other obstructive and reflux uropathy: Secondary | ICD-10-CM | POA: Diagnosis not present

## 2022-11-15 DIAGNOSIS — N401 Enlarged prostate with lower urinary tract symptoms: Secondary | ICD-10-CM | POA: Diagnosis not present

## 2022-11-15 DIAGNOSIS — N3000 Acute cystitis without hematuria: Secondary | ICD-10-CM | POA: Diagnosis not present

## 2022-11-15 LAB — GLUCOSE, CAPILLARY
Glucose-Capillary: 164 mg/dL — ABNORMAL HIGH (ref 70–99)
Glucose-Capillary: 90 mg/dL (ref 70–99)
Glucose-Capillary: 120 mg/dL — ABNORMAL HIGH (ref 70–99)
Glucose-Capillary: 135 mg/dL — ABNORMAL HIGH (ref 70–99)

## 2022-11-15 NOTE — Progress Notes (Signed)
PROGRESS NOTE        PATIENT DETAILS Name: Fred [name] Age: 72 y.o. Sex: male Date of Birth: 1950/09/22 Admit Date: 11/01/2022 Admitting Physician Kristopher Oppenheim, DO [id number], [facility]  Brief Summary: Patient is a 72 y.o.  male with history of intellectual disability, CKD stage IIIa, HTN, DM-2, BPH who presented with acute metabolic encephalopathy in the setting of UTI, and acute urinary retention.  Significant events: 3/5>> admit to [facility] 3/14>> voiding trial failed-400 cc of urine on bladder ultrasound-patient dribbling-Foley placed-1000 cc urine drained.  Discharged to group home but family filed appeal-Home unable to take patient with Foley as staff is not trained.  Significant studies: 3/5>> CXR: No acute findings 3/5>> CT head: No acute intracranial process 3/10>> renal ultrasound: No hydronephrosis  Significant microbiology data: 3/5>> COVID/influenza/RSV PCR: Negative 3/5>> blood culture: No growth  Procedures: None  Consults: None  Subjective: No major issues overnight.  Foley catheter remains in place.  Awaiting discharge back to group home.  Objective: Vitals: Blood pressure 130/81, pulse 86, temperature 97.9 F (36.6 C), temperature source Oral, resp. rate 18, height 5\' 8"  (1.727 m), weight 61.8 kg, SpO2 96 %.   Exam: Gen Exam:not in any distress HEENT:atraumatic, normocephalic Chest: B/L clear to auscultation anteriorly CVS:S1S2 regular Abdomen:soft non tender, non distended Extremities:no edema Neurology: Non focal Skin: no rash  Pertinent Labs/Radiology:    Latest Ref Rng & Units 11/06/2022    5:58 AM 11/04/2022    8:34 AM 11/03/2022    6:10 AM  CBC  WBC 4.0 - 10.5 K/uL 7.9  9.5  12.2   Hemoglobin 13.0 - 17.0 g/dL 10.7  11.6  10.8   Hematocrit 39.0 - 52.0 % 32.8  35.7  33.9   Platelets 150 - 400 K/uL 184  167  136     Lab Results  Component Value Date   NA 142 11/09/2022   K 3.6 11/09/2022    CL 110 11/09/2022   CO2 24 11/09/2022      Assessment/Plan: Acute metabolic encephalopathy secondary to UTI Encephalopathy improved-back to baseline Completed a course of 7 days of IV Rocephin  Acute urinary retention Known history of BPH Failed voiding trial on 3/14-Foley reinserted-1000 cc of urine drained immediately Continue alfuzosin/finasteride Will need outpatient voiding trial-follows with urology at [facility]  CKD stage IIIa At baseline  HTN BP stable Continue amlodipine/Coreg  HLD Statin  DM-2 Resume metformin on discharge CBG stable with SSI  Recent Labs    11/14/22 1622 11/14/22 2153 11/15/22 0808  GLUCAP 82 177* 164*     Intellectual disability At baseline Continue risperidone/benztropine/Zoloft  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  BMI: Estimated body mass index is 20.72 kg/m as calculated from the following:   Height as of this encounter: 5\' 8"  (1.727 m).   Weight as of this encounter: 61.8 kg.   Code status:   Code Status: DNR   DVT Prophylaxis: heparin injection 5,000 Units Start: 11/02/22 0600 SCDs Start: 11/02/22 0551    Family Communication: Adela Glimpse [phone], Sister-Cheryl Owens Shark  [phone] updated over the phone 3/13   Disposition Plan: Status is: Inpatient Remains inpatient appropriate because: Remains medically stable for discharge-once group home able to take patient back with Foley catheter.   Planned Discharge Destination: Group  Home   Diet: Diet Order             Diet - low sodium heart healthy           Diet heart healthy/carb modified Room service appropriate? No; Fluid consistency: Thin  Diet effective now                     Antimicrobial agents: Anti-infectives (From admission, onward)    Start     Dose/Rate Route Frequency Ordered Stop   11/02/22 2000  vancomycin (VANCOREADY) IVPB 750  mg/150 mL  Status:  Discontinued        750 mg 150 mL/hr over 60 Minutes Intravenous Every 24 hours 11/01/22 2050 11/02/22 0058   11/02/22 1000  cefTRIAXone (ROCEPHIN) 1 g in sodium chloride 0.9 % 100 mL IVPB  Status:  Discontinued        1 g 200 mL/hr over 30 Minutes Intravenous Every 24 hours 11/02/22 0058 11/07/22 1600   11/02/22 0600  ceFEPIme (MAXIPIME) 2 g in sodium chloride 0.9 % 100 mL IVPB  Status:  Discontinued        2 g 200 mL/hr over 30 Minutes Intravenous Every 12 hours 11/01/22 2050 11/02/22 0058   11/01/22 1915  ceFEPIme (MAXIPIME) 2 g in sodium chloride 0.9 % 100 mL IVPB        2 g 200 mL/hr over 30 Minutes Intravenous  Once 11/01/22 1913 11/01/22 2005   11/01/22 1915  metroNIDAZOLE (FLAGYL) IVPB 500 mg        500 mg 100 mL/hr over 60 Minutes Intravenous  Once 11/01/22 1913 11/01/22 2048   11/01/22 1915  vancomycin (VANCOCIN) IVPB 1000 mg/200 mL premix        1,000 mg 200 mL/hr over 60 Minutes Intravenous  Once 11/01/22 1913 11/01/22 2109        MEDICATIONS: Scheduled Meds:  alfuzosin  10 mg Oral QPC breakfast   amLODipine  10 mg Oral Daily   aspirin EC  81 mg Oral Daily   atorvastatin  20 mg Oral Daily   benztropine  0.5 mg Oral Daily   carvedilol  12.5 mg Oral BID WC   Chlorhexidine Gluconate Cloth  6 each Topical Daily   finasteride  5 mg Oral Daily   heparin  5,000 Units Subcutaneous Q8H   insulin aspart  0-5 Units Subcutaneous QHS   insulin aspart  0-9 Units Subcutaneous TID WC   risperiDONE  0.5 mg Oral Daily   risperiDONE  3 mg Oral QHS   sertraline  50 mg Oral Daily   Continuous Infusions: PRN Meds:.acetaminophen **OR** acetaminophen, ondansetron **OR** ondansetron (ZOFRAN) IV   I have personally reviewed following labs and imaging studies  LABORATORY DATA: CBC: No results for input(s): "WBC", "NEUTROABS", "HGB", "HCT", "MCV", "PLT" in the last 168 hours.   Basic Metabolic Panel: Recent Labs  Lab 11/09/22 0319  NA 142  K 3.6  CL 110   CO2 24  GLUCOSE 86  BUN 24*  CREATININE 0.99  CALCIUM 8.4*  MG 1.8     GFR: Estimated Creatinine Clearance: 59 mL/min (by C-G formula based on SCr of 0.99 mg/dL).  Liver Function Tests: No results for input(s): "AST", "ALT", "ALKPHOS", "BILITOT", "PROT", "ALBUMIN" in the last 168 hours.  No results for input(s): "LIPASE", "AMYLASE" in the last 168 hours. No results for input(s): "AMMONIA" in the last 168 hours.  Coagulation Profile: No results for input(s): "INR", "PROTIME" in the last 168 hours.  Cardiac Enzymes: No results for input(s): "CKTOTAL", "CKMB", "CKMBINDEX", "TROPONINI" in the last 168 hours.  BNP (last 3 results) No results for input(s): "PROBNP" in the last 8760 hours.  Lipid Profile: No results for input(s): "CHOL", "HDL", "LDLCALC", "TRIG", "CHOLHDL", "LDLDIRECT" in the last 72 hours.  Thyroid Function Tests: No results for input(s): "TSH", "T4TOTAL", "FREET4", "T3FREE", "THYROIDAB" in the last 72 hours.  Anemia Panel: No results for input(s): "VITAMINB12", "FOLATE", "FERRITIN", "TIBC", "IRON", "RETICCTPCT" in the last 72 hours.  Urine analysis:    Component Value Date/Time   COLORURINE STRAW (A) 11/06/2022 0644   APPEARANCEUR CLEAR 11/06/2022 0644   LABSPEC 1.005 11/06/2022 0644   PHURINE 5.0 11/06/2022 0644   GLUCOSEU NEGATIVE 11/06/2022 0644   HGBUR LARGE (A) 11/06/2022 0644   BILIRUBINUR NEGATIVE 11/06/2022 0644   KETONESUR NEGATIVE 11/06/2022 0644   PROTEINUR 30 (A) 11/06/2022 0644   UROBILINOGEN 1.0 02/27/2012 1053   NITRITE NEGATIVE 11/06/2022 0644   LEUKOCYTESUR NEGATIVE 11/06/2022 0644    Sepsis Labs: Lactic Acid, Venous    Component Value Date/Time   LATICACIDVEN 1.5 11/01/2022 2146    MICROBIOLOGY: No results found for this or any previous visit (from the past 240 hour(s)).   RADIOLOGY STUDIES/RESULTS: No results found.   LOS: 13 days   Oren Binet, MD  [facility]    To contact the attending provider  between 7A-7P or the covering provider during after hours 7P-7A, please log into the web site [email or web address] and access using universal [place] password for that web site. If you do not have the password, please call the hospital operator.  11/15/2022, 11:17 AM

## 2022-11-16 DIAGNOSIS — N3000 Acute cystitis without hematuria: Secondary | ICD-10-CM | POA: Diagnosis not present

## 2022-11-16 DIAGNOSIS — N401 Enlarged prostate with lower urinary tract symptoms: Secondary | ICD-10-CM | POA: Diagnosis not present

## 2022-11-16 DIAGNOSIS — N138 Other obstructive and reflux uropathy: Secondary | ICD-10-CM | POA: Diagnosis not present

## 2022-11-16 LAB — GLUCOSE, CAPILLARY
Glucose-Capillary: 222 mg/dL — ABNORMAL HIGH (ref 70–99)
Glucose-Capillary: 55 mg/dL — ABNORMAL LOW (ref 70–99)
Glucose-Capillary: 313 mg/dL — ABNORMAL HIGH (ref 70–99)
Glucose-Capillary: 246 mg/dL — ABNORMAL HIGH (ref 70–99)
Glucose-Capillary: 284 mg/dL — ABNORMAL HIGH (ref 70–99)

## 2022-11-16 NOTE — Progress Notes (Signed)
Mobility Specialist Progress Note   11/16/22 1123  Mobility  Activity Ambulated with assistance in hallway  Level of Assistance Contact guard assist, steadying assist  Assistive Device Front wheel walker  Distance Ambulated (ft) 180 ft  Activity Response Tolerated well  Mobility Referral Yes  $Mobility charge 1 Mobility   Received in bed without complaint and agreeable. Pt requiring directional cues throughout session + verbal cues on gait speed, CGA for steadiness during ambulation. Returned to bed w/o fault and bed alarm on.   Holland Falling Mobility Specialist Please contact via SecureChat or  Rehab office at [phone]

## 2022-11-16 NOTE — Progress Notes (Signed)
Physical Therapy Treatment Patient Details Name: Fred [name] MRN: [id number] DOB: 09/22/1950 Today's Date: 11/16/2022   History of Present Illness 72 y.o. male presents to [facility] on 11/01/2022 with AMS, found to have UTI. PMH includes HTN, CKD III, DMII, BPH, intellectual disability.    PT Comments    Pt tolerated today's session well, performing stair trial and ambulation without AD. Pt's visitor reports pt ambulating independently at baseline, reporting gait pattern without RW is similar to his baseline. With attempt without AD, pt ambulating to RW to use, cued to continue without and pt able to complete ambulation but recommend RW at discharge for comfort/preference. Pt with minG-minA for stair trial with use of rails, pt tolerated well with brief seated rest break after trial but no reports of fatigue. Acute PT will continue to follow up with pt during admission to maximize mobility prior to discharge, recommendations remain appropriate.     Recommendations for follow up therapy are one component of a multi-disciplinary discharge planning process, led by the attending physician.  Recommendations may be updated based on patient status, additional functional criteria and insurance authorization.  Follow Up Recommendations  No PT follow up     Assistance Recommended at Discharge Frequent or constant Supervision/Assistance  Patient can return home with the following A little help with bathing/dressing/bathroom;Assistance with cooking/housework;Assistance with feeding;Direct supervision/assist for medications management;Direct supervision/assist for financial management;Assist for transportation;Help with stairs or ramp for entrance   Equipment Recommendations  Rolling walker (2 wheels)    Recommendations for Other Services       Precautions / Restrictions Precautions Precautions: Fall Restrictions Weight Bearing Restrictions: No     Mobility  Bed Mobility Overal bed mobility:  Modified Independent             General bed mobility comments: pt long sitting in bed but pivoting in/out of bed with modI    Transfers Overall transfer level: Needs assistance Equipment used: Rolling walker (2 wheels) Transfers: Sit to/from Stand Sit to Stand: Min guard           General transfer comment: minG for safety    Ambulation/Gait Ambulation/Gait assistance: Min guard Gait Distance (Feet): 250 Feet (x50 feet without AD) Assistive device: Rolling walker (2 wheels) Gait Pattern/deviations: Trunk flexed, Drifts right/left, Wide base of support, Shuffle, Step-through pattern Gait velocity: WFL     General Gait Details: cues to stay close to RW and upright posture but pt without carryover, toed out gait and wide BOS with waddle appearance. Pt's guest reports no AD at baseline, second ambulation trial performed without AD and similar gait pattern which guest reports is his baseline   Stairs Stairs: Yes Stairs assistance: Min assist, Min guard Stair Management: Two rails, One rail Right, Alternating pattern, Step to pattern, Forwards Number of Stairs: 12 General stair comments: pt ascending with alternating pattern and B rails with minG, cued for use of one rail but pt not implementing. Descending steps pt with use of one rail and step-to pattern with minA provided for balance and controlled descent   Wheelchair Mobility    Modified Rankin (Stroke Patients Only)       Balance Overall balance assessment: Needs assistance Sitting-balance support: No upper extremity supported, Feet supported Sitting balance-Leahy Scale: Good     Standing balance support: During functional activity, Bilateral upper extremity supported Standing balance-Leahy Scale: Fair Standing balance comment: RW for amb increased distances  Cognition Arousal/Alertness: Awake/alert Behavior During Therapy: WFL for tasks assessed/performed, Flat  affect Overall Cognitive Status: History of cognitive impairments - at baseline                                 General Comments: Pleasant and follows simple commands. Minimal verbalizations, primarily mumbling, guest at bedside        Exercises Other Exercises Other Exercises: sit<>stand x5 reps    General Comments General comments (skin integrity, edema, etc.): VSS on room air      Pertinent Vitals/Pain Pain Assessment Pain Assessment: Faces Faces Pain Scale: No hurt    Home Living                          Prior Function            PT Goals (current goals can now be found in the care plan section) Acute Rehab PT Goals Patient Stated Goal: not stated PT Goal Formulation: Patient unable to participate in goal setting Time For Goal Achievement: 11/21/22 Potential to Achieve Goals: Good Progress towards PT goals: Progressing toward goals    Frequency    Min 3X/week      PT Plan Current plan remains appropriate    Co-evaluation              AM-PAC PT "6 Clicks" Mobility   Outcome Measure  Help needed turning from your back to your side while in a flat bed without using bedrails?: None Help needed moving from lying on your back to sitting on the side of a flat bed without using bedrails?: None Help needed moving to and from a bed to a chair (including a wheelchair)?: A Little Help needed standing up from a chair using your arms (e.g., wheelchair or bedside chair)?: A Little Help needed to walk in hospital room?: A Little Help needed climbing 3-5 steps with a railing? : A Little 6 Click Score: 20    End of Session Equipment Utilized During Treatment: Gait belt Activity Tolerance: Patient tolerated treatment well Patient left: in bed;with bed alarm set;with call bell/phone within reach;with family/visitor present Nurse Communication: Mobility status PT Visit Diagnosis: Other abnormalities of gait and mobility (R26.89)      Time: [id number] PT Time Calculation (min) (ACUTE ONLY): 15 min  Charges:  $Gait Training: 8-22 mins                     Charlynne Cousins, PT DPT Acute Rehabilitation Services Office [phone]    Luvenia Heller 11/16/2022, 3:38 PM

## 2022-11-16 NOTE — Progress Notes (Signed)
PROGRESS NOTE        PATIENT DETAILS Name: Fred [name] Age: 72 y.o. Sex: male Date of Birth: [birth year] Admit Date: 11/01/2022 Admitting Physician Kristopher Oppenheim, DO [id number], [facility]  Brief Summary: Patient is a 72 y.o.  male with history of intellectual disability, CKD stage IIIa, HTN, DM-2, BPH who presented with acute metabolic encephalopathy in the setting of UTI, and acute urinary retention.  Significant events: 3/5>> admit to [facility] 3/14>> voiding trial failed-400 cc of urine on bladder ultrasound-patient dribbling-Foley placed-1000 cc urine drained.  Discharged to group home but family filed appeal-Home unable to take patient with Foley as staff is not trained.  Significant studies: 3/5>> CXR: No acute findings 3/5>> CT head: No acute intracranial process 3/10>> renal ultrasound: No hydronephrosis  Significant microbiology data: 3/5>> COVID/influenza/RSV PCR: Negative 3/5>> blood culture: No growth  Procedures: None  Consults: None  Subjective: No major issues overnight-stable for discharge to group home.  Objective: Vitals: Blood pressure (!) 144/75, pulse 78, temperature (!) 97.2 F (36.2 C), temperature source Oral, resp. rate 18, height 5\' 8"  (1.727 m), weight 61.8 kg, SpO2 96 %.   Exam: Gen Exam:not in any distress HEENT:atraumatic, normocephalic Chest: B/L clear to auscultation anteriorly CVS:S1S2 regular Abdomen:soft non tender, non distended Extremities:no edema Neurology: Non focal Skin: no rash  Pertinent Labs/Radiology:    Latest Ref Rng & Units 11/06/2022    5:58 AM 11/04/2022    8:34 AM 11/03/2022    6:10 AM  CBC  WBC 4.0 - 10.5 K/uL 7.9  9.5  12.2   Hemoglobin 13.0 - 17.0 g/dL 10.7  11.6  10.8   Hematocrit 39.0 - 52.0 % 32.8  35.7  33.9   Platelets 150 - 400 K/uL 184  167  136     Lab Results  Component Value Date   NA 142 11/09/2022   K 3.6 11/09/2022   CL 110 11/09/2022   CO2 24  11/09/2022      Assessment/Plan: Acute metabolic encephalopathy secondary to UTI Encephalopathy improved-back to baseline Completed a course of 7 days of IV Rocephin  Acute urinary retention Known history of BPH Failed voiding trial on 3/14-Foley reinserted-1000 cc of urine drained immediately Continue alfuzosin/finasteride Will need outpatient voiding trial-follows with urology at [facility]  CKD stage IIIa At baseline  HTN BP stable Continue amlodipine/Coreg  HLD Statin  DM-2 Resume metformin on discharge CBG stable with SSI  Recent Labs    11/15/22 2125 11/16/22 0816 11/16/22 1132  GLUCAP 135* 222* 55*     Intellectual disability At baseline Continue risperidone/benztropine/Zoloft  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  BMI: Estimated body mass index is 20.72 kg/m as calculated from the following:   Height as of this encounter: 5\' 8"  (1.727 m).   Weight as of this encounter: 61.8 kg.   Code status:   Code Status: DNR   DVT Prophylaxis: heparin injection 5,000 Units Start: 11/02/22 0600 SCDs Start: 11/02/22 0551    Family Communication: Adela Glimpse [phone], Sister-Cheryl Owens Shark  [phone] updated over the phone 3/13   Disposition Plan: Status is: Inpatient Remains inpatient appropriate because: Remains medically stable for discharge-once group home able to take patient back with Foley catheter.   Planned Discharge Destination: Group Home   Diet: Diet Order  Diet - low sodium heart healthy           Diet heart healthy/carb modified Room service appropriate? No; Fluid consistency: Thin  Diet effective now                     Antimicrobial agents: Anti-infectives (From admission, onward)    Start     Dose/Rate Route Frequency Ordered Stop   11/02/22 2000  vancomycin (VANCOREADY) IVPB 750 mg/150 mL  Status:   Discontinued        750 mg 150 mL/hr over 60 Minutes Intravenous Every 24 hours 11/01/22 2050 11/02/22 0058   11/02/22 1000  cefTRIAXone (ROCEPHIN) 1 g in sodium chloride 0.9 % 100 mL IVPB  Status:  Discontinued        1 g 200 mL/hr over 30 Minutes Intravenous Every 24 hours 11/02/22 0058 11/07/22 1600   11/02/22 0600  ceFEPIme (MAXIPIME) 2 g in sodium chloride 0.9 % 100 mL IVPB  Status:  Discontinued        2 g 200 mL/hr over 30 Minutes Intravenous Every 12 hours 11/01/22 2050 11/02/22 0058   11/01/22 1915  ceFEPIme (MAXIPIME) 2 g in sodium chloride 0.9 % 100 mL IVPB        2 g 200 mL/hr over 30 Minutes Intravenous  Once 11/01/22 1913 11/01/22 2005   11/01/22 1915  metroNIDAZOLE (FLAGYL) IVPB 500 mg        500 mg 100 mL/hr over 60 Minutes Intravenous  Once 11/01/22 1913 11/01/22 2048   11/01/22 1915  vancomycin (VANCOCIN) IVPB 1000 mg/200 mL premix        1,000 mg 200 mL/hr over 60 Minutes Intravenous  Once 11/01/22 1913 11/01/22 2109        MEDICATIONS: Scheduled Meds:  alfuzosin  10 mg Oral QPC breakfast   amLODipine  10 mg Oral Daily   aspirin EC  81 mg Oral Daily   atorvastatin  20 mg Oral Daily   benztropine  0.5 mg Oral Daily   carvedilol  12.5 mg Oral BID WC   Chlorhexidine Gluconate Cloth  6 each Topical Daily   finasteride  5 mg Oral Daily   heparin  5,000 Units Subcutaneous Q8H   insulin aspart  0-5 Units Subcutaneous QHS   insulin aspart  0-9 Units Subcutaneous TID WC   risperiDONE  0.5 mg Oral Daily   risperiDONE  3 mg Oral QHS   sertraline  50 mg Oral Daily   Continuous Infusions: PRN Meds:.acetaminophen **OR** acetaminophen, ondansetron **OR** ondansetron (ZOFRAN) IV   I have personally reviewed following labs and imaging studies  LABORATORY DATA: CBC: No results for input(s): "WBC", "NEUTROABS", "HGB", "HCT", "MCV", "PLT" in the last 168 hours.   Basic Metabolic Panel: No results for input(s): "NA", "K", "CL", "CO2", "GLUCOSE", "BUN", "CREATININE",  "CALCIUM", "MG", "PHOS" in the last 168 hours.   GFR: Estimated Creatinine Clearance: 59 mL/min (by C-G formula based on SCr of 0.99 mg/dL).  Liver Function Tests: No results for input(s): "AST", "ALT", "ALKPHOS", "BILITOT", "PROT", "ALBUMIN" in the last 168 hours.  No results for input(s): "LIPASE", "AMYLASE" in the last 168 hours. No results for input(s): "AMMONIA" in the last 168 hours.  Coagulation Profile: No results for input(s): "INR", "PROTIME" in the last 168 hours.  Cardiac Enzymes: No results for input(s): "CKTOTAL", "CKMB", "CKMBINDEX", "TROPONINI" in the last 168 hours.  BNP (last 3 results) No results for input(s): "PROBNP" in the last 8760 hours.  Lipid Profile: No  results for input(s): "CHOL", "HDL", "LDLCALC", "TRIG", "CHOLHDL", "LDLDIRECT" in the last 72 hours.  Thyroid Function Tests: No results for input(s): "TSH", "T4TOTAL", "FREET4", "T3FREE", "THYROIDAB" in the last 72 hours.  Anemia Panel: No results for input(s): "VITAMINB12", "FOLATE", "FERRITIN", "TIBC", "IRON", "RETICCTPCT" in the last 72 hours.  Urine analysis:    Component Value Date/Time   COLORURINE STRAW (A) 11/06/2022 0644   APPEARANCEUR CLEAR 11/06/2022 0644   LABSPEC 1.005 11/06/2022 0644   PHURINE 5.0 11/06/2022 0644   GLUCOSEU NEGATIVE 11/06/2022 0644   HGBUR LARGE (A) 11/06/2022 0644   BILIRUBINUR NEGATIVE 11/06/2022 0644   KETONESUR NEGATIVE 11/06/2022 0644   PROTEINUR 30 (A) 11/06/2022 0644   UROBILINOGEN 1.0 02/27/2012 1053   NITRITE NEGATIVE 11/06/2022 0644   LEUKOCYTESUR NEGATIVE 11/06/2022 0644    Sepsis Labs: Lactic Acid, Venous    Component Value Date/Time   LATICACIDVEN 1.5 11/01/2022 2146    MICROBIOLOGY: No results found for this or any previous visit (from the past 240 hour(s)).   RADIOLOGY STUDIES/RESULTS: No results found.   LOS: 14 days   Oren Binet, MD  [facility]    To contact the attending provider between 7A-7P or the covering  provider during after hours 7P-7A, please log into the web site [email or web address] and access using universal [place] password for that web site. If you do not have the password, please call the hospital operator.  11/16/2022, 11:46 AM

## 2022-11-17 DIAGNOSIS — N3000 Acute cystitis without hematuria: Secondary | ICD-10-CM | POA: Diagnosis not present

## 2022-11-17 DIAGNOSIS — N401 Enlarged prostate with lower urinary tract symptoms: Secondary | ICD-10-CM | POA: Diagnosis not present

## 2022-11-17 DIAGNOSIS — N138 Other obstructive and reflux uropathy: Secondary | ICD-10-CM | POA: Diagnosis not present

## 2022-11-17 LAB — GLUCOSE, CAPILLARY
Glucose-Capillary: 152 mg/dL — ABNORMAL HIGH (ref 70–99)
Glucose-Capillary: 180 mg/dL — ABNORMAL HIGH (ref 70–99)
Glucose-Capillary: 149 mg/dL — ABNORMAL HIGH (ref 70–99)

## 2022-11-17 NOTE — Progress Notes (Signed)
PROGRESS NOTE        PATIENT DETAILS Name: Fred [name] Age: 72 y.o. Sex: male Date of Birth: 06/13/1951 Admit Date: 11/01/2022 Admitting Physician Kristopher Oppenheim, DO [id number], [facility]  Brief Summary: Patient is a 72 y.o.  male with history of intellectual disability, CKD stage IIIa, HTN, DM-2, BPH who presented with acute metabolic encephalopathy in the setting of UTI, and acute urinary retention.  Significant events: 3/5>> admit to [facility] 3/14>> voiding trial failed-400 cc of urine on bladder ultrasound-patient dribbling-Foley placed-1000 cc urine drained.  Discharged to group home but family filed appeal-Home unable to take patient with Foley as staff is not trained.  Significant studies: 3/5>> CXR: No acute findings 3/5>> CT head: No acute intracranial process 3/10>> renal ultrasound: No hydronephrosis  Significant microbiology data: 3/5>> COVID/influenza/RSV PCR: Negative 3/5>> blood culture: No growth  Procedures: None  Consults: None  Subjective: No major issues-lying comfortably in bed.  Awaiting safe disposition-back to group home.  Objective: Vitals: Blood pressure (!) 140/79, pulse 77, temperature 97.9 F (36.6 C), temperature source Tympanic, resp. rate 18, height 5\' 8"  (1.727 m), weight 62.3 kg, SpO2 94 %.   Exam: Gen Exam:not in any distress HEENT:atraumatic, normocephalic Chest: B/L clear to auscultation anteriorly CVS:S1S2 regular Abdomen:soft non tender, non distended Extremities:no edema Neurology: Non focal Skin: no rash  Pertinent Labs/Radiology:    Latest Ref Rng & Units 11/06/2022    5:58 AM 11/04/2022    8:34 AM 11/03/2022    6:10 AM  CBC  WBC 4.0 - 10.5 K/uL 7.9  9.5  12.2   Hemoglobin 13.0 - 17.0 g/dL 10.7  11.6  10.8   Hematocrit 39.0 - 52.0 % 32.8  35.7  33.9   Platelets 150 - 400 K/uL 184  167  136     Lab Results  Component Value Date   NA 142 11/09/2022   K 3.6 11/09/2022   CL  110 11/09/2022   CO2 24 11/09/2022      Assessment/Plan: Acute metabolic encephalopathy secondary to UTI Encephalopathy improved-back to baseline Completed a course of 7 days of IV Rocephin  Acute urinary retention Known history of BPH Failed voiding trial on 3/14-Foley reinserted-1000 cc of urine drained immediately Continue alfuzosin/finasteride Will need outpatient voiding trial-follows with urology at [facility]  CKD stage IIIa At baseline  HTN BP stable Continue amlodipine/Coreg  HLD Statin  DM-2 Resume metformin on discharge CBG stable with SSI  Recent Labs    11/16/22 1505 11/16/22 2024 11/17/22 0819  GLUCAP 246* 284* 152*     Intellectual disability At baseline Continue risperidone/benztropine/Zoloft  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  BMI: Estimated body mass index is 20.88 kg/m as calculated from the following:   Height as of this encounter: 5\' 8"  (1.727 m).   Weight as of this encounter: 62.3 kg.   Code status:   Code Status: DNR   DVT Prophylaxis: heparin injection 5,000 Units Start: 11/02/22 0600 SCDs Start: 11/02/22 0551    Family Communication: Adela Glimpse [phone], Sister-Cheryl Owens Shark  [phone] updated over the phone 3/13   Disposition Plan: Status is: Inpatient Remains inpatient appropriate because: Remains medically stable for discharge-once group home able to take patient back with Foley catheter.   Planned Discharge Destination: Group Home  Diet: Diet Order             Diet - low sodium heart healthy           Diet heart healthy/carb modified Room service appropriate? No; Fluid consistency: Thin  Diet effective now                     Antimicrobial agents: Anti-infectives (From admission, onward)    Start     Dose/Rate Route Frequency Ordered Stop   11/02/22 2000  vancomycin (VANCOREADY) IVPB 750  mg/150 mL  Status:  Discontinued        750 mg 150 mL/hr over 60 Minutes Intravenous Every 24 hours 11/01/22 2050 11/02/22 0058   11/02/22 1000  cefTRIAXone (ROCEPHIN) 1 g in sodium chloride 0.9 % 100 mL IVPB  Status:  Discontinued        1 g 200 mL/hr over 30 Minutes Intravenous Every 24 hours 11/02/22 0058 11/07/22 1600   11/02/22 0600  ceFEPIme (MAXIPIME) 2 g in sodium chloride 0.9 % 100 mL IVPB  Status:  Discontinued        2 g 200 mL/hr over 30 Minutes Intravenous Every 12 hours 11/01/22 2050 11/02/22 0058   11/01/22 1915  ceFEPIme (MAXIPIME) 2 g in sodium chloride 0.9 % 100 mL IVPB        2 g 200 mL/hr over 30 Minutes Intravenous  Once 11/01/22 1913 11/01/22 2005   11/01/22 1915  metroNIDAZOLE (FLAGYL) IVPB 500 mg        500 mg 100 mL/hr over 60 Minutes Intravenous  Once 11/01/22 1913 11/01/22 2048   11/01/22 1915  vancomycin (VANCOCIN) IVPB 1000 mg/200 mL premix        1,000 mg 200 mL/hr over 60 Minutes Intravenous  Once 11/01/22 1913 11/01/22 2109        MEDICATIONS: Scheduled Meds:  alfuzosin  10 mg Oral QPC breakfast   amLODipine  10 mg Oral Daily   aspirin EC  81 mg Oral Daily   atorvastatin  20 mg Oral Daily   benztropine  0.5 mg Oral Daily   carvedilol  12.5 mg Oral BID WC   Chlorhexidine Gluconate Cloth  6 each Topical Daily   finasteride  5 mg Oral Daily   heparin  5,000 Units Subcutaneous Q8H   insulin aspart  0-5 Units Subcutaneous QHS   insulin aspart  0-9 Units Subcutaneous TID WC   risperiDONE  0.5 mg Oral Daily   risperiDONE  3 mg Oral QHS   sertraline  50 mg Oral Daily   Continuous Infusions: PRN Meds:.acetaminophen **OR** acetaminophen, ondansetron **OR** ondansetron (ZOFRAN) IV   I have personally reviewed following labs and imaging studies  LABORATORY DATA: CBC: No results for input(s): "WBC", "NEUTROABS", "HGB", "HCT", "MCV", "PLT" in the last 168 hours.   Basic Metabolic Panel: No results for input(s): "NA", "K", "CL", "CO2", "GLUCOSE",  "BUN", "CREATININE", "CALCIUM", "MG", "PHOS" in the last 168 hours.   GFR: Estimated Creatinine Clearance: 59.4 mL/min (by C-G formula based on SCr of 0.99 mg/dL).  Liver Function Tests: No results for input(s): "AST", "ALT", "ALKPHOS", "BILITOT", "PROT", "ALBUMIN" in the last 168 hours.  No results for input(s): "LIPASE", "AMYLASE" in the last 168 hours. No results for input(s): "AMMONIA" in the last 168 hours.  Coagulation Profile: No results for input(s): "INR", "PROTIME" in the last 168 hours.  Cardiac Enzymes: No results for input(s): "CKTOTAL", "CKMB", "CKMBINDEX", "TROPONINI" in the last 168 hours.  BNP (last 3  results) No results for input(s): "PROBNP" in the last 8760 hours.  Lipid Profile: No results for input(s): "CHOL", "HDL", "LDLCALC", "TRIG", "CHOLHDL", "LDLDIRECT" in the last 72 hours.  Thyroid Function Tests: No results for input(s): "TSH", "T4TOTAL", "FREET4", "T3FREE", "THYROIDAB" in the last 72 hours.  Anemia Panel: No results for input(s): "VITAMINB12", "FOLATE", "FERRITIN", "TIBC", "IRON", "RETICCTPCT" in the last 72 hours.  Urine analysis:    Component Value Date/Time   COLORURINE STRAW (A) 11/06/2022 0644   APPEARANCEUR CLEAR 11/06/2022 0644   LABSPEC 1.005 11/06/2022 0644   PHURINE 5.0 11/06/2022 0644   GLUCOSEU NEGATIVE 11/06/2022 0644   HGBUR LARGE (A) 11/06/2022 0644   BILIRUBINUR NEGATIVE 11/06/2022 0644   KETONESUR NEGATIVE 11/06/2022 0644   PROTEINUR 30 (A) 11/06/2022 0644   UROBILINOGEN 1.0 02/27/2012 1053   NITRITE NEGATIVE 11/06/2022 0644   LEUKOCYTESUR NEGATIVE 11/06/2022 0644    Sepsis Labs: Lactic Acid, Venous    Component Value Date/Time   LATICACIDVEN 1.5 11/01/2022 2146    MICROBIOLOGY: No results found for this or any previous visit (from the past 240 hour(s)).   RADIOLOGY STUDIES/RESULTS: No results found.   LOS: 15 days   Oren Binet, MD  [facility]    To contact the attending provider between  7A-7P or the covering provider during after hours 7P-7A, please log into the web site [email or web address] and access using universal [place] password for that web site. If you do not have the password, please call the hospital operator.  11/17/2022, 11:24 AM

## 2022-11-17 NOTE — Progress Notes (Signed)
Mobility Specialist Progress Note   11/17/22 1622  Mobility  Activity Ambulated with assistance in hallway  Level of Assistance Contact guard assist, steadying assist  Assistive Device Front wheel walker  Distance Ambulated (ft) 210 ft  Activity Response Tolerated well  Mobility Referral Yes  $Mobility charge 1 Mobility   Received in bed having no complaints and agreeable. Min cues needed for gait speed but otherwise steady during ambulation. Returned back to room w/o fault, left in bed and bed alarm on.   [place]land Falling Mobility Specialist Please contact via SecureChat or  Rehab office at [phone]

## 2022-11-18 DIAGNOSIS — N401 Enlarged prostate with lower urinary tract symptoms: Secondary | ICD-10-CM | POA: Diagnosis not present

## 2022-11-18 DIAGNOSIS — N138 Other obstructive and reflux uropathy: Secondary | ICD-10-CM | POA: Diagnosis not present

## 2022-11-18 DIAGNOSIS — N3000 Acute cystitis without hematuria: Secondary | ICD-10-CM | POA: Diagnosis not present

## 2022-11-18 LAB — BASIC METABOLIC PANEL
Anion gap: 8 (ref 5–15)
BUN: 28 mg/dL — ABNORMAL HIGH (ref 8–23)
CO2: 26 mmol/L (ref 22–32)
Calcium: 8.9 mg/dL (ref 8.9–10.3)
Chloride: 108 mmol/L (ref 98–111)
Creatinine, Ser: 1 mg/dL (ref 0.61–1.24)
GFR, Estimated: >60 mL/min (ref >60)
Glucose, Bld: 85 mg/dL (ref 70–99)
Potassium: 4 mmol/L (ref 3.5–5.1)
Sodium: 142 mmol/L (ref 135–145)

## 2022-11-18 LAB — GLUCOSE, CAPILLARY
Glucose-Capillary: 183 mg/dL — ABNORMAL HIGH (ref 70–99)
Glucose-Capillary: 79 mg/dL (ref 70–99)
Glucose-Capillary: 92 mg/dL (ref 70–99)
Glucose-Capillary: 194 mg/dL — ABNORMAL HIGH (ref 70–99)

## 2022-11-18 LAB — CBC
HCT: 29.5 % — ABNORMAL LOW (ref 39.0–52.0)
Hemoglobin: 9.7 g/dL — ABNORMAL LOW (ref 13.0–17.0)
MCH: 30.5 pg (ref 26.0–34.0)
MCHC: 32.9 g/dL (ref 30.0–36.0)
MCV: 92.8 fL (ref 80.0–100.0)
Platelets: 191 10*3/uL (ref 150–400)
RBC: 3.18 MIL/uL — ABNORMAL LOW (ref 4.22–5.81)
RDW: 14.5 % (ref 11.5–15.5)
WBC: 4.1 10*3/uL (ref 4.0–10.5)
nRBC: 0 % (ref 0.0–0.2)

## 2022-11-18 NOTE — Progress Notes (Signed)
Physical Therapy Treatment Patient Details Name: Fred [name] MRN: [id number] DOB: 14-Feb-1951 Today's Date: 11/18/2022   History of Present Illness 72 y.o. male presents to [facility] on 11/01/2022 with AMS, found to have UTI. PMH includes HTN, CKD III, DMII, BPH, intellectual disability.    PT Comments    Pt tolerated today's session well, preferring to ambulate with RW, cueing for posture and proximity to RW provided but little carryover noted, likely pt's mobility baseline. Attempted standing reaching for balance, pt preferring 1UE support with reaching and only tolerating 3-4 reps before attempting to return to sitting, questionable fatigue vs interest in task. Pt's goals extended x2 weeks, frequency decreased to 2x/week as pt is likely close to his baseline, but PT will follow to maintain mobility and progress balance as pt was independent with ambulation at baseline. Discharge recommendations remain appropriate.     Recommendations for follow up therapy are one component of a multi-disciplinary discharge planning process, led by the attending physician.  Recommendations may be updated based on patient status, additional functional criteria and insurance authorization.  Follow Up Recommendations  No PT follow up     Assistance Recommended at Discharge Frequent or constant Supervision/Assistance  Patient can return home with the following A little help with bathing/dressing/bathroom;Assistance with cooking/housework;Assistance with feeding;Direct supervision/assist for medications management;Direct supervision/assist for financial management;Assist for transportation;Help with stairs or ramp for entrance   Equipment Recommendations  Rolling walker (2 wheels)    Recommendations for Other Services       Precautions / Restrictions Precautions Precautions: Fall Restrictions Weight Bearing Restrictions: No     Mobility  Bed Mobility Overal bed mobility: Needs Assistance Bed  Mobility: Supine to Sit, Sit to Supine     Supine to sit: Min assist, HOB elevated Sit to supine: Modified independent (Device/Increase time), HOB elevated   General bed mobility comments: pt reaching for UE support from therapist for supine>sit to pull up, able to return to long sitting in bed with modified independence    Transfers Overall transfer level: Needs assistance Equipment used: Rolling walker (2 wheels), None Transfers: Sit to/from Stand Sit to Stand: Min guard           General transfer comment: minG for safety for standing trials with and without RW. When trialing without RW, pt reaching for UE support from sink    Ambulation/Gait Ambulation/Gait assistance: Min guard Gait Distance (Feet): 180 Feet (with 2 brief standing rest breaks to adjust leg strap) Assistive device: Rolling walker (2 wheels) Gait Pattern/deviations: Trunk flexed, Drifts right/left, Wide base of support, Shuffle, Step-through pattern Gait velocity: WFL     General Gait Details: cues to stay close to RW and upright posture but pt with poor carryover, toed out gait and wide BOS with waddle appearance. Attempted ambulation trial without RW but pt requesting RW for UE support   Stairs             Wheelchair Mobility    Modified Rankin (Stroke Patients Only)       Balance Overall balance assessment: Needs assistance Sitting-balance support: No upper extremity supported, Feet supported Sitting balance-Leahy Scale: Good     Standing balance support: During functional activity, Bilateral upper extremity supported Standing balance-Leahy Scale: Poor Standing balance comment: RW for UE support for ambulation. Attempted standing activities and pt relying on sink for UE support  Cognition Arousal/Alertness: Awake/alert Behavior During Therapy: WFL for tasks assessed/performed, Flat affect Overall Cognitive Status: History of cognitive impairments  - at baseline                                 General Comments: Pleasant and follows simple commands. Minimal verbalizations but able to make needs known        Exercises Other Exercises Other Exercises: standing reaching for targets with sink for UE support, ~5 reps per UE with seated rest breaks    General Comments General comments (skin integrity, edema, etc.): on room air, not connect to monitoring, mild fatigue with mobility but recovers with seated rest break      Pertinent Vitals/Pain Pain Assessment Pain Assessment: Faces Faces Pain Scale: No hurt    Home Living                          Prior Function            PT Goals (current goals can now be found in the care plan section) Acute Rehab PT Goals Patient Stated Goal: not stated PT Goal Formulation: Patient unable to participate in goal setting Time For Goal Achievement: 12/02/22 Potential to Achieve Goals: Fair Progress towards PT goals: Not progressing toward goals - comment (goals extended, pt may be close to baseline)    Frequency    Min 2X/week      PT Plan Frequency needs to be updated    Co-evaluation              AM-PAC PT "6 Clicks" Mobility   Outcome Measure  Help needed turning from your back to your side while in a flat bed without using bedrails?: None Help needed moving from lying on your back to sitting on the side of a flat bed without using bedrails?: A Little Help needed moving to and from a bed to a chair (including a wheelchair)?: A Little Help needed standing up from a chair using your arms (e.g., wheelchair or bedside chair)?: A Little Help needed to walk in hospital room?: A Little Help needed climbing 3-5 steps with a railing? : A Little 6 Click Score: 19    End of Session Equipment Utilized During Treatment: Gait belt Activity Tolerance: Patient tolerated treatment well Patient left: in bed;with bed alarm set;with call bell/phone within  reach Nurse Communication: Mobility status PT Visit Diagnosis: Other abnormalities of gait and mobility (R26.89)     Time: [id number] PT Time Calculation (min) (ACUTE ONLY): 16 min  Charges:  $Therapeutic Activity: 8-22 mins                     Charlynne Cousins, PT DPT Acute Rehabilitation Services Office [phone]    Luvenia Heller 11/18/2022, 3:29 PM

## 2022-11-18 NOTE — Progress Notes (Signed)
PROGRESS NOTE        PATIENT DETAILS Name: Fred [name] Age: 72 y.o. Sex: male Date of Birth: 14-Nov-1950 Admit Date: 11/01/2022 Admitting Physician Kristopher Oppenheim, DO [id number], [facility]  Brief Summary: Patient is a 72 y.o.  male with history of intellectual disability, CKD stage IIIa, HTN, DM-2, BPH who presented with acute metabolic encephalopathy in the setting of UTI, and acute urinary retention.  Significant events: 3/5>> admit to [facility] 3/14>> voiding trial failed-Foley placed-1000 cc urine drained.  Discharged to group home but family filed appeal-Home unable to take patient with Foley as staff is not trained.  Significant studies: 3/5>> CXR: No acute findings 3/5>> CT head: No acute intracranial process 3/10>> renal ultrasound: No hydronephrosis  Significant microbiology data: 3/5>> COVID/influenza/RSV PCR: Negative 3/5>> blood culture: No growth  Procedures: None  Consults: None  Subjective: No major issues overnight.  Appears unchanged.  Has a deck of cards nearby that he is playing with.  Objective: Vitals: Blood pressure 117/67, pulse 79, temperature 98.2 F (36.8 C), temperature source Oral, resp. rate 17, height 5\' 8"  (1.727 m), weight 62.3 kg, SpO2 97 %.   Exam: Gen Exam:not in any distress HEENT:atraumatic, normocephalic Chest: B/L clear to auscultation anteriorly CVS:S1S2 regular Abdomen:soft non tender, non distended Extremities:no edema Neurology: Non focal Skin: no rash  Pertinent Labs/Radiology:    Latest Ref Rng & Units 11/18/2022    6:24 AM 11/06/2022    5:58 AM 11/04/2022    8:34 AM  CBC  WBC 4.0 - 10.5 K/uL 4.1  7.9  9.5   Hemoglobin 13.0 - 17.0 g/dL 9.7  10.7  11.6   Hematocrit 39.0 - 52.0 % 29.5  32.8  35.7   Platelets 150 - 400 K/uL 191  184  167     Lab Results  Component Value Date   NA 142 11/18/2022   K 4.0 11/18/2022   CL 108 11/18/2022   CO2 26 11/18/2022       Assessment/Plan: Acute metabolic encephalopathy secondary to UTI Encephalopathy improved-back to baseline Completed a course of 7 days of IV Rocephin  Acute urinary retention Known history of BPH Failed voiding trial on 3/14-Foley reinserted-1000 cc of urine drained immediately Continue alfuzosin/finasteride Will need outpatient voiding trial-follows with urology at [facility]  CKD stage IIIa At baseline  HTN BP stable Continue amlodipine/Coreg  HLD Statin  DM-2 Resume metformin on discharge CBG stable with SSI  Recent Labs    11/17/22 1216 11/17/22 1629 11/18/22 0855  GLUCAP 180* 149* 183*     Intellectual disability At baseline Continue risperidone/benztropine/Zoloft  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  BMI: Estimated body mass index is 20.88 kg/m as calculated from the following:   Height as of this encounter: 5\' 8"  (1.727 m).   Weight as of this encounter: 62.3 kg.   Code status:   Code Status: DNR   DVT Prophylaxis: heparin injection 5,000 Units Start: 11/02/22 0600 SCDs Start: 11/02/22 0551    Family Communication: Adela Glimpse [phone], Sister-Cheryl Owens Shark  [phone] updated over the phone 3/13   Disposition Plan: Status is: Inpatient Remains inpatient appropriate because: Remains medically stable for discharge-once group home able to take patient back with Foley catheter.   Planned Discharge Destination: Group Home   Diet: Diet  Order             Diet - low sodium heart healthy           Diet heart healthy/carb modified Room service appropriate? No; Fluid consistency: Thin  Diet effective now                     Antimicrobial agents: Anti-infectives (From admission, onward)    Start     Dose/Rate Route Frequency Ordered Stop   11/02/22 2000  vancomycin (VANCOREADY) IVPB 750 mg/150 mL  Status:  Discontinued        750  mg 150 mL/hr over 60 Minutes Intravenous Every 24 hours 11/01/22 2050 11/02/22 0058   11/02/22 1000  cefTRIAXone (ROCEPHIN) 1 g in sodium chloride 0.9 % 100 mL IVPB  Status:  Discontinued        1 g 200 mL/hr over 30 Minutes Intravenous Every 24 hours 11/02/22 0058 11/07/22 1600   11/02/22 0600  ceFEPIme (MAXIPIME) 2 g in sodium chloride 0.9 % 100 mL IVPB  Status:  Discontinued        2 g 200 mL/hr over 30 Minutes Intravenous Every 12 hours 11/01/22 2050 11/02/22 0058   11/01/22 1915  ceFEPIme (MAXIPIME) 2 g in sodium chloride 0.9 % 100 mL IVPB        2 g 200 mL/hr over 30 Minutes Intravenous  Once 11/01/22 1913 11/01/22 2005   11/01/22 1915  metroNIDAZOLE (FLAGYL) IVPB 500 mg        500 mg 100 mL/hr over 60 Minutes Intravenous  Once 11/01/22 1913 11/01/22 2048   11/01/22 1915  vancomycin (VANCOCIN) IVPB 1000 mg/200 mL premix        1,000 mg 200 mL/hr over 60 Minutes Intravenous  Once 11/01/22 1913 11/01/22 2109        MEDICATIONS: Scheduled Meds:  alfuzosin  10 mg Oral QPC breakfast   amLODipine  10 mg Oral Daily   aspirin EC  81 mg Oral Daily   atorvastatin  20 mg Oral Daily   benztropine  0.5 mg Oral Daily   carvedilol  12.5 mg Oral BID WC   Chlorhexidine Gluconate Cloth  6 each Topical Daily   finasteride  5 mg Oral Daily   heparin  5,000 Units Subcutaneous Q8H   insulin aspart  0-5 Units Subcutaneous QHS   insulin aspart  0-9 Units Subcutaneous TID WC   risperiDONE  0.5 mg Oral Daily   risperiDONE  3 mg Oral QHS   sertraline  50 mg Oral Daily   Continuous Infusions: PRN Meds:.acetaminophen **OR** acetaminophen, ondansetron **OR** ondansetron (ZOFRAN) IV   I have personally reviewed following labs and imaging studies  LABORATORY DATA: CBC: Recent Labs  Lab 11/18/22 0624  WBC 4.1  HGB 9.7*  HCT 29.5*  MCV 92.8  PLT 191     Basic Metabolic Panel: Recent Labs  Lab 11/18/22 0624  NA 142  K 4.0  CL 108  CO2 26  GLUCOSE 85  BUN 28*  CREATININE 1.00   CALCIUM 8.9     GFR: Estimated Creatinine Clearance: 58.8 mL/min (by C-G formula based on SCr of 1 mg/dL).  Liver Function Tests: No results for input(s): "AST", "ALT", "ALKPHOS", "BILITOT", "PROT", "ALBUMIN" in the last 168 hours.  No results for input(s): "LIPASE", "AMYLASE" in the last 168 hours. No results for input(s): "AMMONIA" in the last 168 hours.  Coagulation Profile: No results for input(s): "INR", "PROTIME" in the last 168 hours.  Cardiac Enzymes: No results for input(s): "CKTOTAL", "CKMB", "CKMBINDEX", "TROPONINI" in the last 168 hours.  BNP (last 3 results) No results for input(s): "PROBNP" in the last 8760 hours.  Lipid Profile: No results for input(s): "CHOL", "HDL", "LDLCALC", "TRIG", "CHOLHDL", "LDLDIRECT" in the last 72 hours.  Thyroid Function Tests: No results for input(s): "TSH", "T4TOTAL", "FREET4", "T3FREE", "THYROIDAB" in the last 72 hours.  Anemia Panel: No results for input(s): "VITAMINB12", "FOLATE", "FERRITIN", "TIBC", "IRON", "RETICCTPCT" in the last 72 hours.  Urine analysis:    Component Value Date/Time   COLORURINE STRAW (A) 11/06/2022 0644   APPEARANCEUR CLEAR 11/06/2022 0644   LABSPEC 1.005 11/06/2022 0644   PHURINE 5.0 11/06/2022 0644   GLUCOSEU NEGATIVE 11/06/2022 0644   HGBUR LARGE (A) 11/06/2022 0644   BILIRUBINUR NEGATIVE 11/06/2022 0644   KETONESUR NEGATIVE 11/06/2022 0644   PROTEINUR 30 (A) 11/06/2022 0644   UROBILINOGEN 1.0 02/27/2012 1053   NITRITE NEGATIVE 11/06/2022 0644   LEUKOCYTESUR NEGATIVE 11/06/2022 0644    Sepsis Labs: Lactic Acid, Venous    Component Value Date/Time   LATICACIDVEN 1.5 11/01/2022 2146    MICROBIOLOGY: No results found for this or any previous visit (from the past 240 hour(s)).   RADIOLOGY STUDIES/RESULTS: No results found.   LOS: 16 days   Oren Binet, MD  [facility]    To contact the attending provider between 7A-7P or the covering provider during after hours  7P-7A, please log into the web site [email or web address] and access using universal [place] password for that web site. If you do not have the password, please call the hospital operator.  11/18/2022, 10:52 AM

## 2022-11-18 NOTE — Plan of Care (Signed)

## 2022-11-18 NOTE — Progress Notes (Signed)
Mobility Specialist - Progress Note   11/18/22 1602  Mobility  Activity Ambulated with assistance in hallway  Level of Assistance Contact guard assist, steadying assist  Assistive Device Front wheel walker  Distance Ambulated (ft) 180 ft  Activity Response Tolerated well  Mobility Referral Yes  $Mobility charge 1 Mobility   Pt was received in bed and agreeable to session. Pt required x3 standing rest breaks d/t fatigue. No other complaints throughout. Pt was returned to bed with all needs met.  Franki Monte  Mobility Specialist Please contact via Solicitor or Rehab office at [phone]

## 2022-11-18 NOTE — TOC Progression Note (Signed)
Transition of Care [facility]) - Progression Note    Patient Details  Name: Fred [name] MRN: [id number] Date of Birth: 03-16-1951  [facility] CM/SW Contact  Levonne Lapping, RN Phone Number: 11/18/2022, 12:55 PM  Clinical Narrative:    Final decision from No [employer] today after they "reconsidered appeal denial on Monday 3/18.  Denied and patient liability remains to be 3/18.  CM called Sister, Malachy Mood to make sure she was aware-  She stated she heard from [place] last pm. CM asked if there had been any progress on staff being trained for Foley care. Malachy Mood stated she does not get involved with those things at the home and she  let's [place] (Family home care provider company owner)  handle it . I told her that I had been trying to reach [place] but I am not getting return calls. We ended the conversation by CM stating a call would be made to [facility].    CM called [employer]- Voice mail stated that if a quick answer was needed, to please text. CM has sent text to [employer]'s number and is awaiting a response.    Patient remains stable for DC  Provider stated the Foley will most likely remain for at least another 2-3 weeks .   TOC will continue to follow and assist in any way to ensure a safe dc.  Teaching foley care  has been offered to [facility] for her staff in previous conversations by way of being present for DC instructions and teaching Foley care. Home health has previously been arranged and will be reinstated at dc from [facility]   Expected Discharge Plan: Group Home Barriers to Discharge: Other (must enter comment) (Family Group home unable to accept with catheter.  May return when catheter is removed)  Expected Discharge Plan and Services         Expected Discharge Date: 11/10/22                                     Social Determinants of Health (SDOH) Interventions SDOH Screenings   Tobacco Use: Medium Risk (11/01/2022)    Readmission Risk Interventions     No data to display

## 2022-11-18 NOTE — TOC Transition Note (Signed)
Discharge medications (1) are being stored in the main pharmacy on the ground floor until patient is ready for discharge.   

## 2022-11-19 DIAGNOSIS — N3000 Acute cystitis without hematuria: Secondary | ICD-10-CM | POA: Diagnosis not present

## 2022-11-19 LAB — GLUCOSE, CAPILLARY
Glucose-Capillary: 101 mg/dL — ABNORMAL HIGH (ref 70–99)
Glucose-Capillary: 188 mg/dL — ABNORMAL HIGH (ref 70–99)
Glucose-Capillary: 144 mg/dL — ABNORMAL HIGH (ref 70–99)
Glucose-Capillary: 197 mg/dL — ABNORMAL HIGH (ref 70–99)

## 2022-11-19 NOTE — Progress Notes (Signed)
PROGRESS NOTE        PATIENT DETAILS Name: Fred [name] Age: 72 y.o. Sex: male Date of Birth: [birth year] Admit Date: 11/01/2022 Admitting Physician Kristopher Oppenheim, DO [id number], [facility]  Brief Summary: Patient is a 72 y.o.  male with history of intellectual disability, CKD stage IIIa, HTN, DM-2, BPH who presented with acute metabolic encephalopathy in the setting of UTI, and acute urinary retention.  Significant events: 3/5>> admit to [facility] 3/14>> voiding trial failed-Foley placed-1000 cc urine drained.  Discharged to group home but family filed appeal-Home unable to take patient with Foley as staff is not trained.  Significant studies: 3/5>> CXR: No acute findings 3/5>> CT head: No acute intracranial process 3/10>> renal ultrasound: No hydronephrosis  Significant microbiology data: 3/5>> COVID/influenza/RSV PCR: Negative 3/5>> blood culture: No growth  Procedures: None  Consults: None  Subjective:  Patient in bed, appears comfortable, denies any headache, no fever, no chest pain or pressure, no shortness of breath , no abdominal pain. No focal weakness.  Objective: Vitals: Blood pressure (!) 141/80, pulse 78, temperature 98.1 F (36.7 C), temperature source Oral, resp. rate 18, height 5\' 8"  (1.727 m), weight 62.3 kg, SpO2 97 %.   Exam:  Awake Alert, No new F.N deficits, Foley in place [place].AT,PERRAL Supple Neck, No JVD,   Symmetrical Chest wall movement, Good air movement bilaterally, CTAB RRR,No Gallops, Rubs or new Murmurs,  +ve B.Sounds, Abd Soft, No tenderness,   No Cyanosis, Clubbing or edema    Assessment/Plan:  Acute metabolic encephalopathy secondary to UTI Encephalopathy improved-back to baseline Completed a course of 7 days of IV Rocephin  Acute urinary retention Known history of BPH Failed voiding trial on 3/14-Foley reinserted-1000 cc of urine drained immediately Continue alfuzosin/finasteride Will  need outpatient voiding trial-follows with urology at [facility]  CKD stage IIIa At baseline  HTN BP stable Continue amlodipine/Coreg  HLD Statin  Intellectual disability At baseline Continue risperidone/benztropine/Zoloft  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  DM-2 Resume metformin on discharge CBG stable with SSI  Recent Labs    11/18/22 1613 11/18/22 2108 11/19/22 0755  GLUCAP 92 194* 101*     BMI: Estimated body mass index is 20.88 kg/m as calculated from the following:   Height as of this encounter: 5\' 8"  (1.727 m).   Weight as of this encounter: 62.3 kg.   Code status:   Code Status: DNR   DVT Prophylaxis: heparin injection 5,000 Units Start: 11/02/22 0600 SCDs Start: 11/02/22 0551    Family Communication: Adela Glimpse [phone], Sister-Cheryl Owens Shark  [phone] updated over the phone 3/13   Disposition Plan: Status is: Inpatient Remains inpatient appropriate because: Remains medically stable for discharge-once group home able to take patient back with Foley catheter.   Planned Discharge Destination: Group Home   Diet: Diet Order             Diet - low sodium heart healthy           Diet heart healthy/carb modified Room service appropriate? No; Fluid consistency: Thin  Diet effective now                    MEDICATIONS: Scheduled Meds:  alfuzosin  10 mg Oral QPC breakfast   amLODipine  10 mg Oral Daily  aspirin EC  81 mg Oral Daily   atorvastatin  20 mg Oral Daily   benztropine  0.5 mg Oral Daily   carvedilol  12.5 mg Oral BID WC   Chlorhexidine Gluconate Cloth  6 each Topical Daily   finasteride  5 mg Oral Daily   heparin  5,000 Units Subcutaneous Q8H   insulin aspart  0-5 Units Subcutaneous QHS   insulin aspart  0-9 Units Subcutaneous TID WC   risperiDONE  0.5 mg Oral Daily   risperiDONE  3 mg Oral QHS   sertraline  50 mg Oral  Daily   Continuous Infusions: PRN Meds:.acetaminophen **OR** acetaminophen, ondansetron **OR** ondansetron (ZOFRAN) IV   I have personally reviewed following labs and imaging studies  LABORATORY DATA:  Recent Labs  Lab 11/18/22 0624  WBC 4.1  HGB 9.7*  HCT 29.5*  PLT 191  MCV 92.8  MCH 30.5  MCHC 32.9  RDW 14.5    Recent Labs  Lab 11/18/22 0624  NA 142  K 4.0  CL 108  CO2 26  ANIONGAP 8  GLUCOSE 85  BUN 28*  CREATININE 1.00  CALCIUM 8.9   Urine analysis:    Component Value Date/Time   COLORURINE STRAW (A) 11/06/2022 0644   APPEARANCEUR CLEAR 11/06/2022 0644   LABSPEC 1.005 11/06/2022 0644   PHURINE 5.0 11/06/2022 [place] 11/06/2022 0644   HGBUR LARGE (A) 11/06/2022 0644   BILIRUBINUR NEGATIVE 11/06/2022 [place] 11/06/2022 0644   PROTEINUR 30 (A) 11/06/2022 0644   UROBILINOGEN 1.0 02/27/2012 1053   NITRITE NEGATIVE 11/06/2022 0644   LEUKOCYTESUR NEGATIVE 11/06/2022 0644    Sepsis Labs: Lactic Acid, Venous    Component Value Date/Time   LATICACIDVEN 1.5 11/01/2022 2146    MICROBIOLOGY: No results found for this or any previous visit (from the past 240 hour(s)).   RADIOLOGY STUDIES/RESULTS: No results found.   LOS: 17 days   Signature  -    Lala Lund M.D on 11/19/2022 at 9:33 AM   -  To page go to [email or web address]

## 2022-11-20 DIAGNOSIS — N3000 Acute cystitis without hematuria: Secondary | ICD-10-CM | POA: Diagnosis not present

## 2022-11-20 LAB — GLUCOSE, CAPILLARY
Glucose-Capillary: 241 mg/dL — ABNORMAL HIGH (ref 70–99)
Glucose-Capillary: 173 mg/dL — ABNORMAL HIGH (ref 70–99)
Glucose-Capillary: 60 mg/dL — ABNORMAL LOW (ref 70–99)
Glucose-Capillary: 78 mg/dL (ref 70–99)
Glucose-Capillary: 110 mg/dL — ABNORMAL HIGH (ref 70–99)
Glucose-Capillary: 165 mg/dL — ABNORMAL HIGH (ref 70–99)

## 2022-11-20 NOTE — Progress Notes (Signed)
PROGRESS NOTE        PATIENT DETAILS Name: Fred [name] Age: 72 y.o. Sex: male Date of Birth: 03/17/1951 Admit Date: 11/01/2022 Admitting Physician Kristopher Oppenheim, DO [id number], [facility]  Brief Summary: Patient is a 72 y.o.  male with history of intellectual disability, CKD stage IIIa, HTN, DM-2, BPH who presented with acute metabolic encephalopathy in the setting of UTI, and acute urinary retention.  Significant events: 3/5>> admit to [facility] 3/14>> voiding trial failed-Foley placed-1000 cc urine drained.  Discharged to group home but family filed appeal-Home unable to take patient with Foley as staff is not trained.  Significant studies: 3/5>> CXR: No acute findings 3/5>> CT head: No acute intracranial process 3/10>> renal ultrasound: No hydronephrosis  Significant microbiology data: 3/5>> COVID/influenza/RSV PCR: Negative 3/5>> blood culture: No growth  Procedures: None  Consults: None  Subjective: Patient in bed, appears comfortable, denies any headache, no fever, no chest pain or pressure, no shortness of breath , no abdominal pain. No new focal weakness.   Objective: Vitals: Blood pressure 134/82, pulse 72, temperature 98.4 F (36.9 C), resp. rate 19, height 5\' 8"  (1.727 m), weight 58.6 kg, SpO2 99 %.   Exam:  Awake Alert, No new F.N deficits, Foley in place [place].AT,PERRAL Supple Neck, No JVD,   Symmetrical Chest wall movement, Good air movement bilaterally, CTAB RRR,No Gallops, Rubs or new Murmurs,  +ve B.Sounds, Abd Soft, No tenderness,   No Cyanosis, Clubbing or edema    Assessment/Plan:  Acute metabolic encephalopathy secondary to UTI Encephalopathy improved-back to baseline Completed a course of 7 days of IV Rocephin  Acute urinary retention Known history of BPH Failed voiding trial on 3/14-Foley reinserted-1000 cc of urine drained immediately Continue alfuzosin/finasteride Will need outpatient voiding  trial-follow[facility] with [facility]rology at [facility]  CKD stage IIIa At baseline  HTN BP stable Continue amlodipine/Coreg  HLD Statin  Intellectual disability At baseline Continue risperidone/benztropine/Zoloft  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  DM-2 Resume metformin on discharge CBG stable with SSI  Recent Labs    11/19/22 1540 11/19/22 2122 11/20/22 0813  GLUCAP 144* 197* 173*     BMI: Estimated body mass index is 19.64 kg/m as calculated from the following:   Height as of this encounter: 5\' 8"  (1.727 m).   Weight as of this encounter: 58.6 kg.   Code status:   Code Status: DNR   DVT Prophylaxis: heparin injection 5,000 Units Start: 11/02/22 0600 SCDs Start: 11/02/22 0551    Family Communication: Adela Glimpse [phone], Sister-Cheryl Owens Shark  [phone] updated over the phone 3/13   Disposition Plan: Status is: Inpatient Remains inpatient appropriate because: Remains medically stable for discharge-once group home able to take patient back with Foley catheter.   Planned Discharge Destination: Group Home   Diet: Diet Order             Diet - low sodium heart healthy           Diet heart healthy/carb modified Room service appropriate? No; Fluid consistency: Thin  Diet effective now                    MEDICATIONS: Scheduled Meds:  alfuzosin  10 mg Oral QPC breakfast   amLODipine  10 mg Oral Daily   aspirin  EC  81 mg Oral Daily   atorvastatin  20 mg Oral Daily   benztropine  0.5 mg Oral Daily   carvedilol  12.5 mg Oral BID WC   Chlorhexidine Gluconate Cloth  6 each Topical Daily   finasteride  5 mg Oral Daily   heparin  5,000 Units Subcutaneous Q8H   insulin aspart  0-5 Units Subcutaneous QHS   insulin aspart  0-9 Units Subcutaneous TID WC   risperiDONE  0.5 mg Oral Daily   risperiDONE  3 mg Oral QHS   sertraline  50 mg Oral Daily   Continuous  Infusions: PRN Meds:.acetaminophen **OR** acetaminophen, ondansetron **OR** ondansetron (ZOFRAN) IV   I have personally reviewed following labs and imaging studies  LABORATORY DATA:  Recent Labs  Lab 11/18/22 0624  WBC 4.1  HGB 9.7*  HCT 29.5*  PLT 191  MCV 92.8  MCH 30.5  MCHC 32.9  RDW 14.5    Recent Labs  Lab 11/18/22 0624  NA 142  K 4.0  CL 108  CO2 26  ANIONGAP 8  GLUCOSE 85  BUN 28*  CREATININE 1.00  CALCIUM 8.9   Urine analysis:    Component Value Date/Time   COLORURINE STRAW (A) 11/06/2022 0644   APPEARANCEUR CLEAR 11/06/2022 0644   LABSPEC 1.005 11/06/2022 0644   PHURINE 5.0 11/06/2022 [place] 11/06/2022 0644   HGBUR LARGE (A) 11/06/2022 0644   BILIRUBINUR NEGATIVE 11/06/2022 [place] 11/06/2022 0644   PROTEINUR 30 (A) 11/06/2022 0644   UROBILINOGEN 1.0 02/27/2012 1053   NITRITE NEGATIVE 11/06/2022 0644   LEUKOCYTESUR NEGATIVE 11/06/2022 0644    Sepsis Labs: Lactic Acid, Venous    Component Value Date/Time   LATICACIDVEN 1.5 11/01/2022 2146    MICROBIOLOGY: No results found for this or any previous visit (from the past 240 hour(s)).   RADIOLOGY STUDIES/RESULTS: No results found.   LOS: 18 days   Signature  -    Lala Lund M.D on 11/20/2022 at 9:24 AM   -  To page go to [email or web address]

## 2022-11-20 NOTE — Progress Notes (Signed)
Mobility Specialist Progress Note   11/20/22 1801  Mobility  Activity Ambulated with assistance in hallway  Level of Assistance Contact guard assist, steadying assist  Assistive Device Front wheel walker  Distance Ambulated (ft) 244 ft  Activity Response Tolerated well  Mobility Referral Yes  $Mobility charge 1 Mobility   Received in bed having no complaints and agreeable. Pt veering to the left today but able to correct w/ verbal cues, otherwise steady during ambulation. Returned back to room w/o fault, left in bed and bed alarm on.   Holland Falling Mobility Specialist Please contact via SecureChat or  Rehab office at [phone]

## 2022-11-20 NOTE — Plan of Care (Signed)
?  Problem: Health Behavior/Discharge Planning: ?Goal: Ability to manage health-related needs will improve ?Outcome: Adequate for Discharge ?  ?Problem: Clinical Measurements: ?Goal: Ability to maintain clinical measurements within normal limits will improve ?Outcome: Adequate for Discharge ?Goal: Will remain free from infection ?Outcome: Adequate for Discharge ?Goal: Diagnostic test results will improve ?Outcome: Adequate for Discharge ?Goal: Respiratory complications will improve ?Outcome: Adequate for Discharge ?Goal: Cardiovascular complication will be avoided ?Outcome: Adequate for Discharge ?  ?

## 2022-11-21 DIAGNOSIS — N3000 Acute cystitis without hematuria: Secondary | ICD-10-CM | POA: Diagnosis not present

## 2022-11-21 LAB — GLUCOSE, CAPILLARY
Glucose-Capillary: 292 mg/dL — ABNORMAL HIGH (ref 70–99)
Glucose-Capillary: 169 mg/dL — ABNORMAL HIGH (ref 70–99)
Glucose-Capillary: 298 mg/dL — ABNORMAL HIGH (ref 70–99)

## 2022-11-21 NOTE — Progress Notes (Signed)
PROGRESS NOTE        PATIENT DETAILS Name: Fred [name] Age: 72 y.o. Sex: male Date of Birth: 14-Jan-1951 Admit Date: 11/01/2022 Admitting Physician Kristopher Oppenheim, DO [id number], [facility]  Brief Summary: Patient is a 72 y.o.  male with history of intellectual disability, CKD stage IIIa, HTN, DM-2, BPH who presented with acute metabolic encephalopathy in the setting of UTI, and acute urinary retention.  Significant events: 3/5>> admit to [facility] 3/14>> voiding trial failed-Foley placed-1000 cc urine drained.  Discharged to group home but family filed appeal-Home unable to take patient with Foley as staff is not trained.  Significant studies: 3/5>> CXR: No acute findings 3/5>> CT head: No acute intracranial process 3/10>> renal ultrasound: No hydronephrosis  Significant microbiology data: 3/5>> COVID/influenza/RSV PCR: Negative 3/5>> blood culture: No growth  Procedures: None  Consults: None  Subjective: Patient in bed, appears comfortable, denies any headache, no fever, no chest pain or pressure, no shortness of breath , no abdominal pain. No new focal weakness.   Objective: Vitals: Blood pressure 117/69, pulse 83, temperature 98 F (36.7 C), temperature source Oral, resp. rate 16, height 5\' 8"  (1.727 m), weight 58.4 kg, SpO2 95 %.   Exam:  Awake Alert, No new F.N deficits, Foley in place [place].AT,PERRAL Supple Neck, No JVD,   Symmetrical Chest wall movement, Good air movement bilaterally, CTAB RRR,No Gallops, Rubs or new Murmurs,  +ve B.Sounds, Abd Soft, No tenderness,   No Cyanosis, Clubbing or edema    Assessment/Plan:  Acute metabolic encephalopathy secondary to UTI Encephalopathy improved-back to baseline Completed a course of 7 days of IV Rocephin  Acute urinary retention Known history of BPH Failed voiding trial on 3/14-Foley reinserted-1000 cc of urine drained immediately Continue alfuzosin/finasteride Will  need outpatient voiding trial-follows with urology at [facility]  CKD stage IIIa At baseline  HTN BP stable Continue amlodipine/Coreg  HLD Statin  Intellectual disability At baseline Continue risperidone/benztropine/Zoloft  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  DM-2 Resume metformin on discharge CBG stable with SSI  Recent Labs    11/20/22 1538 11/20/22 2149 11/21/22 0721  GLUCAP 110* 165* 292*     BMI: Estimated body mass index is 19.58 kg/m as calculated from the following:   Height as of this encounter: 5\' 8"  (1.727 m).   Weight as of this encounter: 58.4 kg.   Code status:   Code Status: DNR   DVT Prophylaxis: heparin injection 5,000 Units Start: 11/02/22 0600 SCDs Start: 11/02/22 0551    Family Communication: Adela Glimpse [phone], Sister-Cheryl Owens Shark  [phone] updated over the phone 3/13   Disposition Plan: Status is: Inpatient Remains inpatient appropriate because: Remains medically stable for discharge-once group home able to take patient back with Foley catheter.   Planned Discharge Destination: Group Home   Diet: Diet Order             Diet - low sodium heart healthy           Diet heart healthy/carb modified Room service appropriate? No; Fluid consistency: Thin  Diet effective now                    MEDICATIONS: Scheduled Meds:  alfuzosin  10 mg Oral QPC breakfast   amLODipine  10 mg Oral Daily  aspirin EC  81 mg Oral Daily   atorvastatin  20 mg Oral Daily   benztropine  0.5 mg Oral Daily   carvedilol  12.5 mg Oral BID WC   Chlorhexidine Gluconate Cloth  6 each Topical Daily   finasteride  5 mg Oral Daily   heparin  5,000 Units Subcutaneous Q8H   insulin aspart  0-5 Units Subcutaneous QHS   insulin aspart  0-9 Units Subcutaneous TID WC   risperiDONE  0.5 mg Oral Daily   risperiDONE  3 mg Oral QHS   sertraline  50 mg  Oral Daily   Continuous Infusions: PRN Meds:.acetaminophen **OR** acetaminophen, ondansetron **OR** ondansetron (ZOFRAN) IV   I have personally reviewed following labs and imaging studies  LABORATORY DATA:  Recent Labs  Lab 11/18/22 0624  WBC 4.1  HGB 9.7*  HCT 29.5*  PLT 191  MCV 92.8  MCH 30.5  MCHC 32.9  RDW 14.5    Recent Labs  Lab 11/18/22 0624  NA 142  K 4.0  CL 108  CO2 26  ANIONGAP 8  GLUCOSE 85  BUN 28*  CREATININE 1.00  CALCIUM 8.9   Urine analysis:    Component Value Date/Time   COLORURINE STRAW (A) 11/06/2022 0644   APPEARANCEUR CLEAR 11/06/2022 0644   LABSPEC 1.005 11/06/2022 0644   PHURINE 5.0 11/06/2022 [place] 11/06/2022 0644   HGBUR LARGE (A) 11/06/2022 0644   BILIRUBINUR NEGATIVE 11/06/2022 [place] 11/06/2022 0644   PROTEINUR 30 (A) 11/06/2022 0644   UROBILINOGEN 1.0 02/27/2012 1053   NITRITE NEGATIVE 11/06/2022 0644   LEUKOCYTESUR NEGATIVE 11/06/2022 0644    Sepsis Labs: Lactic Acid, Venous    Component Value Date/Time   LATICACIDVEN 1.5 11/01/2022 2146    MICROBIOLOGY: No results found for this or any previous visit (from the past 240 hour(s)).   RADIOLOGY STUDIES/RESULTS: No results found.   LOS: 19 days   Signature  -    Lala Lund M.D on 11/21/2022 at 9:38 AM   -  To page go to [email or web address]

## 2022-11-21 NOTE — TOC Transition Note (Signed)
Discharge medications (1) have been retrieved from Main Pharmacy weekend lockup and are now being stored in the Transitions of Care (TOC) Pharmacy on the second floor until patient is ready for discharge.   

## 2022-11-21 NOTE — TOC Progression Note (Signed)
Transition of Care [facility]) - Progression Note    Patient Details  Name: Fred [name] MRN: [id number] Date of Birth: Jul 05, 1951  Transition of Care [facility]) CM/SW Contact  Levonne Lapping, RN Phone Number: 11/21/2022, 2:41 PM  Clinical Narrative:     CM Spoke with Demetrius Charity.  Family home will accept patient back tomorrow.  [facility]  is sending staff out to train [facility] caregivers on Foley care. Juleen China from [facility]) will be transporting patient home. [employer] will be delivering a rolling walker to bedside prior to dc.   TOC will continue to follow patient for any additional discharge needs      Expected Discharge Plan: Group Home Barriers to Discharge: Other (must enter comment) (Family Group home unable to accept with catheter.  May return when catheter is removed)  Expected Discharge Plan and Services         Expected Discharge Date: 11/10/22                                     Social Determinants of Health (SDOH) Interventions SDOH Screenings   Tobacco Use: Medium Risk (11/01/2022)    Readmission Risk Interventions     No data to display

## 2022-11-22 ENCOUNTER — Other Ambulatory Visit (HOSPITAL_COMMUNITY): Payer: Self-pay

## 2022-11-22 DIAGNOSIS — N3000 Acute cystitis without hematuria: Secondary | ICD-10-CM | POA: Diagnosis not present

## 2022-11-22 NOTE — Discharge Instructions (Signed)
Follow with [facility] [place] in 7 days   Get CBC, CMP  -  checked next visit with your primary MD    Activity: As tolerated with Full fall precautions use walker/cane & assistance as needed  Disposition group home  Diet: Heart Healthy    Special Instructions: If you have smoked or chewed Tobacco  in the last 2 yrs please stop smoking, stop any regular Alcohol  and or any Recreational drug use.  On your next visit with your primary care physician please Get Medicines reviewed and adjusted.  Please request your Prim.MD to go over all Hospital Tests and Procedure/Radiological results at the follow up, please get all Hospital records sent to your Prim MD by signing hospital release before you go home.  If you experience worsening of your admission symptoms, develop shortness of breath, life threatening emergency, suicidal or homicidal thoughts you must seek medical attention immediately by calling 911 or calling your MD immediately  if symptoms less severe.  You Must read complete instructions/literature along with all the possible adverse reactions/side effects for all the Medicines you take and that have been prescribed to you. Take any new Medicines after you have completely understood and accpet all the possible adverse reactions/side effects.

## 2022-11-22 NOTE — TOC Progression Note (Signed)
Transition of Care [facility]) - Progression Note    Patient Details  Name: Kennard Johannsen MRN: [id number] Date of Birth: Aug 12, 1951  Transition of Care [facility]) CM/SW Contact  Levonne Lapping, RN Phone Number: 11/22/2022, 9:15 AM  Clinical Narrative:     CM left VM for [employer] at [facility] [phone].  CM  wanting to confirm DC for today.  Juleen China from [facility] to transport 2:00 .  [facility] will be providing HHSN and will be arriving at [facility] this afternoon to do [facility] and foley care education. No additional TOC needs at this time    Expected Discharge Plan: Group Home Barriers to Discharge: Other (must enter comment) (Family Group home unable to accept with catheter.  May return when catheter is removed)  Expected Discharge Plan and Services         Expected Discharge Date: 11/22/22                                     Social Determinants of Health (SDOH) Interventions SDOH Screenings   Tobacco Use: Medium Risk (11/01/2022)    Readmission Risk Interventions     No data to display

## 2022-11-22 NOTE — Progress Notes (Signed)
Physical Therapy Treatment Patient Details Name: Fred [name] MRN: [id number] DOB: 03-May-1951 Today's Date: 11/22/2022   History of Present Illness 72 y.o. male presents to [facility] hospital on 11/01/2022 with AMS, found to have UTI. PMH includes HTN, CKD III, DMII, BPH, intellectual disability.    PT Comments    Pt tolerated today's session well, noted improved balance and steadiness with ambulation, closer proximity to RW and better management of RW when returning to sitting. Pt tolerating increased ambulation with minimal head turning for scanning environment, no LOB. Pt continues to progress well, PT will follow up during admission to progress mobility, discharge plans remain appropriate.    Recommendations for follow up therapy are one component of a multi-disciplinary discharge planning process, led by the attending physician.  Recommendations may be updated based on patient status, additional functional criteria and insurance authorization.  Follow Up Recommendations       Assistance Recommended at Discharge Frequent or constant Supervision/Assistance  Patient can return home with the following A little help with bathing/dressing/bathroom;Assistance with cooking/housework;Assistance with feeding;Direct supervision/assist for medications management;Direct supervision/assist for financial management;Assist for transportation;Help with stairs or ramp for entrance   Equipment Recommendations  Rolling walker (2 wheels)    Recommendations for Other Services       Precautions / Restrictions Precautions Precautions: Fall Restrictions Weight Bearing Restrictions: No     Mobility  Bed Mobility Overal bed mobility: Needs Assistance Bed Mobility: Supine to Sit, Sit to Supine     Supine to sit: HOB elevated, Modified independent (Device/Increase time) Sit to supine: Modified independent (Device/Increase time), HOB elevated   General bed mobility comments: increased tiem but completing  without assist    Transfers Overall transfer level: Needs assistance Equipment used: Rolling walker (2 wheels), None Transfers: Sit to/from Stand Sit to Stand: Supervision           General transfer comment: supervision for safety with use of RW    Ambulation/Gait Ambulation/Gait assistance: Min guard Gait Distance (Feet): 500 Feet Assistive device: Rolling walker (2 wheels) Gait Pattern/deviations: Trunk flexed, Drifts right/left, Wide base of support, Shuffle, Step-through pattern Gait velocity: WFL     General Gait Details: In previous sessions pt with increased gait speed and noted imbalance, however improved steadiness today with slightly decreased gait speed. Pt intermittently turning his head with cues to assess higher level balance, no LOB noted, close guard provided   Stairs             Wheelchair Mobility    Modified Rankin (Stroke Patients Only)       Balance Overall balance assessment: Needs assistance Sitting-balance support: No upper extremity supported, Feet supported Sitting balance-Leahy Scale: Good     Standing balance support: During functional activity, Bilateral upper extremity supported Standing balance-Leahy Scale: Poor Standing balance comment: RW for UE support for ambulation                            Cognition Arousal/Alertness: Awake/alert Behavior During Therapy: WFL for tasks assessed/performed, Flat affect Overall Cognitive Status: History of cognitive impairments - at baseline                                 General Comments: Pleasant and follows simple commands. Minimal verbalizations but able to make needs known        Exercises      General Comments General comments (skin  integrity, edema, etc.): on room air, not currently monitored, asymptomatic throughout      Pertinent Vitals/Pain Pain Assessment Pain Assessment: Faces Faces Pain Scale: No hurt    Home Living                           Prior Function            PT Goals (current goals can now be found in the care plan section) Acute Rehab PT Goals Patient Stated Goal: not stated PT Goal Formulation: Patient unable to participate in goal setting Time For Goal Achievement: 12/02/22 Potential to Achieve Goals: Fair Progress towards PT goals: Progressing toward goals    Frequency    Min 2X/week      PT Plan Current plan remains appropriate    Co-evaluation              AM-PAC PT "6 Clicks" Mobility   Outcome Measure  Help needed turning from your back to your side while in a flat bed without using bedrails?: None Help needed moving from lying on your back to sitting on the side of a flat bed without using bedrails?: None Help needed moving to and from a bed to a chair (including a wheelchair)?: A Little Help needed standing up from a chair using your arms (e.g., wheelchair or bedside chair)?: A Little Help needed to walk in hospital room?: A Little Help needed climbing 3-5 steps with a railing? : A Little 6 Click Score: 20    End of Session Equipment Utilized During Treatment: Gait belt Activity Tolerance: Patient tolerated treatment well Patient left: in bed;with bed alarm set;with call bell/phone within reach Nurse Communication: Mobility status PT Visit Diagnosis: Other abnormalities of gait and mobility (R26.89)     Time: [id number] PT Time Calculation (min) (ACUTE ONLY): 13 min  Charges:  $Gait Training: 8-22 mins                     Charlynne Cousins, PT DPT Acute Rehabilitation Services Office [phone]    Luvenia Heller 11/22/2022, 2:19 PM

## 2022-11-22 NOTE — TOC Transition Note (Signed)
Transition of Care [facility]) - CM/SW Discharge Note   Patient Details  Name: Fred [name] MRN: [id number] Date of Birth: 1950/09/14  [facility]) CM/SW Contact:  Levonne Lapping, RN Phone Number: 11/22/2022, 11:38 AM   Clinical Narrative:    Patient is dc'ing back to [facility] Today .  Juleen China Orthoptist of home) will be picking up patient at 2:00. This is per Demetrius Charity [employer] of family home car company)   [facility] will provide Skilled Nursing for [employer].  [facility] RN will meet patient and staff at [facility] this afternoon to provide a Start of Care and teaching foley care to staff.   A rolling walker has been provided by [employer] and is in room bedside.   No additional TOC needs      Barriers to Discharge: Other (must enter comment) (Family Group home unable to accept with catheter.  May return when catheter is removed)   Patient Goals and CMS Choice      Discharge Placement                         Discharge Plan and Services Additional resources added to the After Visit Summary for                                       Social Determinants of Health (SDOH) Interventions SDOH Screenings   Tobacco Use: Medium Risk (11/01/2022)     Readmission Risk Interventions     No data to display

## 2023-01-01 ENCOUNTER — Inpatient Hospital Stay (HOSPITAL_COMMUNITY)
Admission: EM | Admit: 2023-01-01 | Discharge: 2023-01-03 | DRG: 698 | Disposition: A | Discharge status: Home Health | Payer: Medicare Other | Attending: Internal Medicine | Admitting: Internal Medicine

## 2023-01-01 ENCOUNTER — Emergency Department (HOSPITAL_COMMUNITY): Payer: Medicare Other

## 2023-01-01 ENCOUNTER — Other Ambulatory Visit: Payer: Self-pay

## 2023-01-01 ENCOUNTER — Encounter (HOSPITAL_COMMUNITY): Payer: Self-pay | Admitting: Internal Medicine

## 2023-01-01 DIAGNOSIS — N390 Urinary tract infection, site not specified: Secondary | ICD-10-CM | POA: Diagnosis present

## 2023-01-01 DIAGNOSIS — Z66 Do not resuscitate: Secondary | ICD-10-CM | POA: Diagnosis present

## 2023-01-01 DIAGNOSIS — Z7984 Long term (current) use of oral hypoglycemic drugs: Secondary | ICD-10-CM | POA: Diagnosis not present

## 2023-01-01 DIAGNOSIS — T83518A Infection and inflammatory reaction due to other urinary catheter, initial encounter: Principal | ICD-10-CM | POA: Diagnosis present

## 2023-01-01 DIAGNOSIS — G9341 Metabolic encephalopathy: Secondary | ICD-10-CM | POA: Diagnosis present

## 2023-01-01 DIAGNOSIS — I129 Hypertensive chronic kidney disease with stage 1 through stage 4 chronic kidney disease, or unspecified chronic kidney disease: Secondary | ICD-10-CM | POA: Diagnosis present

## 2023-01-01 DIAGNOSIS — N138 Other obstructive and reflux uropathy: Secondary | ICD-10-CM | POA: Diagnosis present

## 2023-01-01 DIAGNOSIS — Z7982 Long term (current) use of aspirin: Secondary | ICD-10-CM

## 2023-01-01 DIAGNOSIS — I152 Hypertension secondary to endocrine disorders: Secondary | ICD-10-CM | POA: Diagnosis present

## 2023-01-01 DIAGNOSIS — E876 Hypokalemia: Secondary | ICD-10-CM | POA: Diagnosis present

## 2023-01-01 DIAGNOSIS — D649 Anemia, unspecified: Secondary | ICD-10-CM | POA: Diagnosis present

## 2023-01-01 DIAGNOSIS — Y846 Urinary catheterization as the cause of abnormal reaction of the patient, or of later complication, without mention of misadventure at the time of the procedure: Secondary | ICD-10-CM | POA: Diagnosis present

## 2023-01-01 DIAGNOSIS — E1169 Type 2 diabetes mellitus with other specified complication: Secondary | ICD-10-CM | POA: Diagnosis present

## 2023-01-01 DIAGNOSIS — Z87891 Personal history of nicotine dependence: Secondary | ICD-10-CM | POA: Diagnosis not present

## 2023-01-01 DIAGNOSIS — N179 Acute kidney failure, unspecified: Principal | ICD-10-CM | POA: Diagnosis present

## 2023-01-01 DIAGNOSIS — E1122 Type 2 diabetes mellitus with diabetic chronic kidney disease: Secondary | ICD-10-CM | POA: Diagnosis present

## 2023-01-01 DIAGNOSIS — N1831 Chronic kidney disease, stage 3a: Secondary | ICD-10-CM | POA: Diagnosis present

## 2023-01-01 DIAGNOSIS — E119 Type 2 diabetes mellitus without complications: Secondary | ICD-10-CM

## 2023-01-01 DIAGNOSIS — Z79899 Other long term (current) drug therapy: Secondary | ICD-10-CM | POA: Diagnosis not present

## 2023-01-01 DIAGNOSIS — F79 Unspecified intellectual disabilities: Secondary | ICD-10-CM | POA: Diagnosis present

## 2023-01-01 DIAGNOSIS — E1159 Type 2 diabetes mellitus with other circulatory complications: Secondary | ICD-10-CM | POA: Diagnosis present

## 2023-01-01 DIAGNOSIS — F840 Autistic disorder: Secondary | ICD-10-CM | POA: Diagnosis present

## 2023-01-01 DIAGNOSIS — N401 Enlarged prostate with lower urinary tract symptoms: Secondary | ICD-10-CM | POA: Diagnosis present

## 2023-01-01 HISTORY — DX: Urinary tract infection, site not specified: N39.0

## 2023-01-01 LAB — URINALYSIS, ROUTINE W REFLEX MICROSCOPIC
Bacteria, UA: NONE SEEN
Bilirubin Urine: NEGATIVE
Glucose, UA: NEGATIVE mg/dL
Ketones, ur: NEGATIVE mg/dL
Nitrite: NEGATIVE
Protein, ur: >=300 mg/dL — AB
RBC / HPF: >50 RBC/hpf (ref 0–5)
Specific Gravity, Urine: 1.011 (ref 1.005–1.030)
WBC, UA: >50 WBC/hpf (ref 0–5)
pH: 7 (ref 5.0–8.0)

## 2023-01-01 LAB — CBC WITH DIFFERENTIAL/PLATELET
Abs Immature Granulocytes: 0.06 10*3/uL (ref 0.00–0.07)
Basophils Absolute: 0 10*3/uL (ref 0.0–0.1)
Basophils Relative: 0 %
Eosinophils Absolute: 0 10*3/uL (ref 0.0–0.5)
Eosinophils Relative: 0 %
HCT: 36.9 % — ABNORMAL LOW (ref 39.0–52.0)
Hemoglobin: 11.8 g/dL — ABNORMAL LOW (ref 13.0–17.0)
Immature Granulocytes: 1 %
Lymphocytes Relative: 4 %
Lymphs Abs: 0.4 10*3/uL — ABNORMAL LOW (ref 0.7–4.0)
MCH: 29.3 pg (ref 26.0–34.0)
MCHC: 32 g/dL (ref 30.0–36.0)
MCV: 91.6 fL (ref 80.0–100.0)
Monocytes Absolute: 0.6 10*3/uL (ref 0.1–1.0)
Monocytes Relative: 6 %
Neutro Abs: 8.9 10*3/uL — ABNORMAL HIGH (ref 1.7–7.7)
Neutrophils Relative %: 89 %
Platelets: 158 10*3/uL (ref 150–400)
RBC: 4.03 MIL/uL — ABNORMAL LOW (ref 4.22–5.81)
RDW: 14.2 % (ref 11.5–15.5)
WBC: 10 10*3/uL (ref 4.0–10.5)
nRBC: 0 % (ref 0.0–0.2)

## 2023-01-01 LAB — COMPREHENSIVE METABOLIC PANEL
ALT: 13 U/L (ref 0–44)
AST: 18 U/L (ref 15–41)
Albumin: 3.5 g/dL (ref 3.5–5.0)
Alkaline Phosphatase: 48 U/L (ref 38–126)
Anion gap: 12 (ref 5–15)
BUN: 36 mg/dL — ABNORMAL HIGH (ref 8–23)
CO2: 23 mmol/L (ref 22–32)
Calcium: 8.9 mg/dL (ref 8.9–10.3)
Chloride: 104 mmol/L (ref 98–111)
Creatinine, Ser: 1.73 mg/dL — ABNORMAL HIGH (ref 0.61–1.24)
GFR, Estimated: 41 mL/min — ABNORMAL LOW (ref >60)
Glucose, Bld: 147 mg/dL — ABNORMAL HIGH (ref 70–99)
Potassium: 2.9 mmol/L — ABNORMAL LOW (ref 3.5–5.1)
Sodium: 139 mmol/L (ref 135–145)
Total Bilirubin: 1 mg/dL (ref 0.3–1.2)
Total Protein: 7.1 g/dL (ref 6.5–8.1)

## 2023-01-01 LAB — CK: Total CK: 107 U/L (ref 49–397)

## 2023-01-01 LAB — GLUCOSE, CAPILLARY: Glucose-Capillary: 153 mg/dL — ABNORMAL HIGH (ref 70–99)

## 2023-01-01 LAB — LACTIC ACID, PLASMA
Lactic Acid, Venous: 1 mmol/L (ref 0.5–1.9)
Lactic Acid, Venous: 0.9 mmol/L (ref 0.5–1.9)

## 2023-01-01 MED ORDER — POTASSIUM CHLORIDE CRYS ER 20 MEQ PO TBCR
40.0000 meq | EXTENDED_RELEASE_TABLET | Freq: Once | ORAL | Status: AC
  Administered 2023-01-01: 40 meq via ORAL
  Filled 2023-01-01: qty 2

## 2023-01-01 MED ORDER — ACETAMINOPHEN 650 MG RE SUPP: 650.0000 mg | Freq: Four times a day (QID) | RECTAL | Status: DC | PRN

## 2023-01-01 MED ORDER — CARVEDILOL 12.5 MG PO TABS
12.5000 mg | ORAL_TABLET | Freq: Two times a day (BID) | ORAL | Status: DC
  Administered 2023-01-01 – 2023-01-02 (×3): 12.5 mg via ORAL
  Filled 2023-01-01 (×3): qty 1

## 2023-01-01 MED ORDER — ONDANSETRON HCL 4 MG PO TABS: 4.0000 mg | ORAL_TABLET | Freq: Four times a day (QID) | ORAL | Status: DC | PRN

## 2023-01-01 MED ORDER — INSULIN ASPART 100 UNIT/ML IJ SOLN: 0.0000 [IU] | Freq: Three times a day (TID) | INTRAMUSCULAR | Status: DC

## 2023-01-01 MED ORDER — SODIUM CHLORIDE 0.9 % IV SOLN
2.0000 g | Freq: Once | INTRAVENOUS | Status: AC
  Administered 2023-01-01: 2 g via INTRAVENOUS
  Filled 2023-01-01: qty 20

## 2023-01-01 MED ORDER — SODIUM CHLORIDE 0.9 % IV BOLUS
1000.0000 mL | Freq: Once | INTRAVENOUS | Status: AC
  Administered 2023-01-01: 1000 mL via INTRAVENOUS

## 2023-01-01 MED ORDER — ONDANSETRON HCL 4 MG/2ML IJ SOLN: 4.0000 mg | Freq: Four times a day (QID) | INTRAMUSCULAR | Status: DC | PRN

## 2023-01-01 MED ORDER — RISPERIDONE 2 MG PO TABS
3.0000 mg | ORAL_TABLET | Freq: Every day | ORAL | Status: DC
  Administered 2023-01-01 – 2023-01-02 (×2): 3 mg via ORAL
  Filled 2023-01-01 (×2): qty 1

## 2023-01-01 MED ORDER — AMLODIPINE BESYLATE 10 MG PO TABS
10.0000 mg | ORAL_TABLET | Freq: Every day | ORAL | Status: DC
  Administered 2023-01-02 – 2023-01-03 (×2): 10 mg via ORAL
  Filled 2023-01-01 (×2): qty 1

## 2023-01-01 MED ORDER — ACETAMINOPHEN 325 MG PO TABS: 650.0000 mg | ORAL_TABLET | Freq: Four times a day (QID) | ORAL | Status: DC | PRN

## 2023-01-01 MED ORDER — SODIUM CHLORIDE 0.9 % IV BOLUS
500.0000 mL | Freq: Once | INTRAVENOUS | Status: AC
  Administered 2023-01-01: 500 mL via INTRAVENOUS

## 2023-01-01 MED ORDER — RISPERIDONE 0.25 MG PO TABS
0.5000 mg | ORAL_TABLET | Freq: Every day | ORAL | Status: DC
  Administered 2023-01-02 – 2023-01-03 (×2): 0.5 mg via ORAL
  Filled 2023-01-01 (×2): qty 2

## 2023-01-01 MED ORDER — SODIUM CHLORIDE 0.9 % IV SOLN
1.0000 g | INTRAVENOUS | Status: DC
  Administered 2023-01-02: 1 g via INTRAVENOUS
  Filled 2023-01-01 (×2): qty 10

## 2023-01-01 MED ORDER — POTASSIUM CHLORIDE IN NACL 20-0.9 MEQ/L-% IV SOLN
INTRAVENOUS | Status: AC
  Filled 2023-01-01 (×2): qty 1000

## 2023-01-01 MED ORDER — BENZTROPINE MESYLATE 0.5 MG PO TABS
0.5000 mg | ORAL_TABLET | Freq: Every day | ORAL | Status: DC
  Administered 2023-01-02 – 2023-01-03 (×2): 0.5 mg via ORAL
  Filled 2023-01-01 (×3): qty 1

## 2023-01-01 MED ORDER — ALFUZOSIN HCL ER 10 MG PO TB24
10.0000 mg | ORAL_TABLET | Freq: Every day | ORAL | Status: DC
  Administered 2023-01-02 – 2023-01-03 (×2): 10 mg via ORAL
  Filled 2023-01-01 (×2): qty 1

## 2023-01-01 MED ORDER — FERROUS SULFATE 325 (65 FE) MG PO TABS
325.0000 mg | ORAL_TABLET | ORAL | Status: DC
  Administered 2023-01-02: 325 mg via ORAL
  Filled 2023-01-01: qty 1

## 2023-01-01 NOTE — ED Triage Notes (Signed)
Pt arrived via [facility] from a group home for hematuria and fever. He is non verbal and autistic. EMS does not have the name of the group home, however gave me the address ([address] [place]). One of the care takers should be coming soon. He started antibiotics on Friday for a UTI.

## 2023-01-01 NOTE — H&P (Signed)
History and Physical    Patient: Fred [name] XBJ:[id number] DOB: 06/18/1951 DOA: 01/01/2023 DOS: the patient was seen and examined on 01/01/2023 PCP: Tracey Harries, MD  Patient coming from: Home  Chief Complaint:  Chief Complaint  Patient presents with   Fever   Hematuria   HPI: Fred [name] is a 72 y.o. male with medical history significant of intellectual disability, type 2 diabetes, hypertension, BPH, urinary retention, chronic Foley catheter placement, stage 3a CKD, normocytic anemia, history of UTIs, history of UTI related sepsis who was sent from his home group due to hematuria and fever. The Patient is nonverbal and autistic.  He is unable to provide further information.  His sister was at bedside.  She stated that he went to see urology 2 days ago and had an urine culture.  Results are not available yet on care everywhere.  Lab work: Urine analysis was cloudy with large hemoglobin and leukocyte esterase.  There was proteinuria more than 300 mg/deciliter.  More than 50 RBCs, more than 50 WBC, no bacteria but positive WBC clumps.  CBC showed a white count 10.0, hemoglobin 11.8 g/dL platelets 308.  Lactic acid x 2 normal.  CMP with a potassium of 2.9 mmol/L.  Glucose 147, BUN 36 and creatinine 1.73 mg/dL.  The rest of the electrolytes and LFTs were normal.  His creatinine level about 6 weeks ago was 1.00 mg/dL.  Imaging: CT head without contrast with no acute intercranial abnormality.  Unchanged moderate chronic small vessel disease.   ED course: Initial vital signs were temperature 97.5 F, pulse 97, respiration 18, BP 124/73 mmHg O2 sat 98% on room air.  The patient received ceftriaxone 2 g IVPB, KCl 40 mEq p.o. and 1500 mL of normal saline bolus.  Review of Systems: As mentioned in the history of present illness. All other systems reviewed and are negative. Past Medical History:  Diagnosis Date   Diabetes mellitus    Hypertension    Mental retardation    No past surgical history  on file. Social History:  reports that he quit smoking about 6 years ago. His smoking use included cigarettes. He has never used smokeless tobacco. He reports that he does not drink alcohol and does not use drugs.  No Known Allergies  Family History  Family history unknown: Yes    Prior to Admission medications   Medication Sig Start Date End Date Taking? Authorizing Provider  alfuzosin (UROXATRAL) 10 MG 24 hr tablet Take 10 mg by mouth daily after breakfast.  03/30/20   [provider]  ALLERGY RELIEF 10 MG tablet Take 10 mg by mouth daily.    [provider]  amLODipine (NORVASC) 10 MG tablet Take 10 mg by mouth daily.  05/22/19   [provider]  aspirin EC 81 MG tablet Take 81 mg by mouth daily.    [provider]  atorvastatin (LIPITOR) 20 MG tablet Take 20 mg by mouth daily.  02/28/18   [provider]  benztropine (COGENTIN) 0.5 MG tablet Take 0.5 mg by mouth daily.     [provider]  carvedilol (COREG) 12.5 MG tablet Take 12.5 mg by mouth 2 (two) times daily with a meal.  06/16/15   [provider]  ferrous sulfate 325 (65 FE) MG tablet Take 325 mg by mouth every Monday, Wednesday, and Friday.  03/27/18   [provider]  finasteride (PROSCAR) 5 MG tablet Take 1 tablet (5 mg total) by mouth daily. 11/10/22 02/08/23  Ghimire, [employer]  M, MD  glucose monitoring kit (FREESTYLE) monitoring kit Please dispense whichever glucometer is covered by pt's insurance with lancets and strips also; check blood sugar as directed 03/27/17   [provider]  ketoconazole (NIZORAL) 2 % shampoo Apply 1 Application topically 2 (two) times a week.    [provider]  Lancets Misc. (ACCU-CHEK SOFTCLIX LANCET DEV) KIT by Does not apply route. 11/24/17   [provider]  metFORMIN (GLUCOPHAGE) 500 MG tablet Take 500 mg by mouth 2 (two) times daily.  01/20/15   [provider]  risperiDONE (RISPERDAL) 0.5 MG  tablet Take 0.5 mg by mouth daily.    [provider]  risperiDONE (RISPERDAL) 3 MG tablet Take 3 mg by mouth at bedtime.  05/16/19   [provider]  sertraline (ZOLOFT) 50 MG tablet Take 50 mg by mouth daily.    [provider]    Physical Exam: Vitals:   01/01/23 0839 01/01/23 0840 01/01/23 1230  BP:  124/73   Pulse: 97    Resp: 18    Temp: (!) 97.5 F (36.4 C)  98.1 F (36.7 C)  TempSrc: Oral  Oral  SpO2: 98%     Physical Exam Vitals and nursing note reviewed.  Constitutional:      General: He is awake. He is not in acute distress.    Appearance: Normal appearance.  HENT:     Head: Normocephalic.     Nose: No rhinorrhea.     Mouth/Throat:     Mouth: Mucous membranes are dry.  Eyes:     General: No scleral icterus.    Pupils: Pupils are equal, round, and reactive to light.  Neck:     Vascular: No JVD.  Cardiovascular:     Rate and Rhythm: Normal rate and regular rhythm.     Heart sounds: S1 normal and S2 normal.  Pulmonary:     Effort: Pulmonary effort is normal.     Breath sounds: Normal breath sounds. No wheezing, rhonchi or rales.  Abdominal:     General: Bowel sounds are normal.     Palpations: Abdomen is soft.  Musculoskeletal:     Cervical back: Neck supple.     Right lower leg: No edema.     Left lower leg: No edema.  Skin:    General: Skin is warm and dry.  Neurological:     General: No focal deficit present.     Mental Status: He is alert. Mental status is at baseline. He is disoriented.  Psychiatric:        Mood and Affect: Mood normal.        Behavior: Behavior is cooperative.   Data Reviewed:  There are no new results to review at this time.  Assessment and Plan: Principal Problem:   Acute metabolic encephalopathy In the setting of:   Acute UTI (urinary tract infection) Secondary to Foley cath placement due to:   BPH with urinary obstruction Admit to telemetry/inpatient. Continue ceftriaxone 1 g IVPB  daily. Continue alfuzosin 10 mg p.o. daily. Follow-up urine culture and sensitivity in Care Everywhere. If unable to do so, we will request it from their MR department. Follow-up blood culture and sensitivity. Follow-up CBC and chemistry in the morning.  Active Problems:   AKI (acute kidney injury) (HCC) Superimposed on:   Stage 3a chronic kidney disease (CKD) (HCC)  Continue IV fluids. Hold ARB/ACE. Hold diuretic. Avoid hypotension. Avoid nephrotoxins. Monitor intake and output. Monitor renal function electrolytes.  Hypokalemia Supplementing. Follow-up potassium level.    Hypertension associated with diabetes (HCC) Hold lisinopril and HCTZ. Continue amlodipine 10 mg p.o. daily. Continue carvedilol 12.5 mg p.o. twice daily.    Type 2 diabetes mellitus without complication,  without long-term current use of insulin (HCC) Carbohydrate modified diet. Hold metformin due to AKI. CBG monitoring before meals/bedtime with RI SS.    Anemia Monitor hematocrit and hemoglobin. Transfuse as needed.    Advance Care Planning:   Code Status: DNR   Consults:   Family Communication:   Severity of Illness: The appropriate patient status for this patient is INPATIENT. Inpatient status is judged to be reasonable and necessary in order to provide the required intensity of service to ensure the patient's safety. The patient's presenting symptoms, physical exam findings, and initial radiographic and laboratory data in the context of their chronic comorbidities is felt to place them at high risk for further clinical deterioration. Furthermore, it is not anticipated that the patient will be medically stable for discharge from the hospital within 2 midnights of admission.   * I certify that at the point of admission it is my clinical judgment that the patient will require inpatient hospital care spanning beyond 2 midnights from the point of admission due to high intensity of service, high risk  for further deterioration and high frequency of surveillance required.*  Author: Bobette Mo, MD 01/01/2023 1:06 PM  For on call review [email or web address].   This document was prepared using Dragon voice recognition software and may contain some unintended transcription errors.

## 2023-01-01 NOTE — ED Provider Notes (Signed)
[place] [facility]GENC[facility] DEPARTMENT AT [facility] Provider Note   CSN: [id number] Arrival date & time: 01/01/23  3086     History {Add pertinent medical, surgical, social history, OB history to HPI:1} Chief Complaint  Patient presents with   Fever   Hematuria    Fred [name] is a 72 y.o. male.  Patient has history of hypertension diabetes and autism.  He was seen by urology Friday and they started treating him for UTI.  Patient is more lethargic and not talking like he usually does.   Fever Hematuria       Home Medications Prior to Admission medications   Medication Sig Start Date End Date Taking? Authorizing Provider  alfuzosin (UROXATRAL) 10 MG 24 hr tablet Take 10 mg by mouth daily after breakfast.  03/30/20   [provider]  ALLERGY RELIEF 10 MG tablet Take 10 mg by mouth daily.    [provider]  amLODipine (NORVASC) 10 MG tablet Take 10 mg by mouth daily.  05/22/19   [provider]  aspirin EC 81 MG tablet Take 81 mg by mouth daily.    [provider]  atorvastatin (LIPITOR) 20 MG tablet Take 20 mg by mouth daily.  02/28/18   [provider]  benztropine (COGENTIN) 0.5 MG tablet Take 0.5 mg by mouth daily.     [provider]  carvedilol (COREG) 12.5 MG tablet Take 12.5 mg by mouth 2 (two) times daily with a meal.  06/16/15   [provider]  ferrous sulfate 325 (65 FE) MG tablet Take 325 mg by mouth every Monday, Wednesday, and Friday.  03/27/18   [provider]  finasteride (PROSCAR) 5 MG tablet Take 1 tablet (5 mg total) by mouth daily. 11/10/22 02/08/23  GhimireWerner Lean, MD  glucose monitoring kit (FREESTYLE) monitoring kit Please dispense whichever glucometer is covered by pt's insurance with lancets and strips also; check blood sugar as directed 03/27/17   [provider]  ketoconazole (NIZORAL) 2 % shampoo Apply 1 Application topically 2 (two) times a week.    [provider]  Lancets Misc. (ACCU-CHEK SOFTCLIX LANCET DEV) KIT by Does not apply route. 11/24/17   [provider]  metFORMIN (GLUCOPHAGE) 500 MG tablet Take 500 mg by mouth 2 (two) times daily.  01/20/15   [provider]  risperiDONE (RISPERDAL) 0.5 MG tablet Take 0.5 mg by mouth daily.    [provider]  risperiDONE (RISPERDAL) 3 MG tablet Take 3 mg by mouth at bedtime.  05/16/19   [provider]  sertraline (ZOLOFT) 50 MG tablet Take 50 mg by mouth daily.    [provider]      Allergies    Patient has no known allergies.    Review of Systems   Review of Systems  Constitutional:  Positive for fever.  Genitourinary:  Positive for hematuria.    Physical Exam Updated Vital Signs BP 124/73 (BP Location: Left Arm)   Pulse 97   Temp 98.1 F (36.7 C) (Oral)   Resp 18   SpO2 98%  Physical Exam  ED Results / Procedures / Treatments   Labs (all labs ordered are listed, but only abnormal results are displayed) Labs Reviewed  CBC WITH DIFFERENTIAL/PLATELET - Abnormal; Notable for the following components:      Result Value   RBC 4.03 (*)    Hemoglobin 11.8 (*)    HCT 36.9 (*)    Neutro Abs 8.9 (*)  Lymphs Abs 0.4 (*)    All other components within normal limits  COMPREHENSIVE METABOLIC PANEL - Abnormal; Notable for the following components:   Potassium 2.9 (*)    Glucose, Bld 147 (*)    BUN 36 (*)    Creatinine, Ser 1.73 (*)    GFR, Estimated 41 (*)    All other components within normal limits  URINALYSIS, ROUTINE W REFLEX MICROSCOPIC - Abnormal; Notable for the following components:   APPearance CLOUDY (*)    Hgb urine dipstick LARGE (*)    Protein, ur >=300 (*)    Leukocytes,Ua LARGE (*)    All other components within normal limits  LACTIC ACID, PLASMA  LACTIC ACID, PLASMA    EKG None  Radiology CT Head Wo Contrast  Result Date: 01/01/2023 CLINICAL DATA:  Memory loss. EXAM: CT HEAD WITHOUT CONTRAST TECHNIQUE:  Contiguous axial images were obtained from the base of the skull through the vertex without intravenous contrast. RADIATION DOSE REDUCTION: This exam was performed according to the departmental dose-optimization program which includes automated exposure control, adjustment of the mA and/or kV according to patient size and/or use of iterative reconstruction technique. COMPARISON:  Head CT 11/01/2022. FINDINGS: Brain: No acute hemorrhage. Unchanged moderate chronic small-vessel disease. Cortical gray-white differentiation is otherwise preserved. Prominence of the ventricles and sulci within expected range for age. No hydrocephalus or extra-axial collection. No mass effect or midline shift. Vascular: No hyperdense vessel or unexpected calcification. Skull: No calvarial fracture or suspicious bone lesion. Skull base is unremarkable. Sinuses/Orbits: Unremarkable. Other: None. IMPRESSION: 1. No acute intracranial abnormality. 2. Unchanged moderate chronic small-vessel disease. Electronically Signed   By: Orvan Falconer M.D.   On: 01/01/2023 12:34    Procedures Procedures  {Document cardiac monitor, telemetry assessment procedure when appropriate:1}  Medications Ordered in ED Medications  sodium chloride 0.9 % bolus 500 mL (0 mLs Intravenous Stopped 01/01/23 0934)  sodium chloride 0.9 % bolus 1,000 mL (0 mLs Intravenous Stopped 01/01/23 1113)  cefTRIAXone (ROCEPHIN) 2 g in sodium chloride 0.9 % 100 mL IVPB (0 g Intravenous Stopped 01/01/23 1039)  potassium chloride SA (KLOR-CON M) CR tablet 40 mEq (40 mEq Oral Given 01/01/23 1304)    ED Course/ Medical Decision Making/ A&P   {   Click here for ABCD2, HEART and other calculatorsREFRESH Note before signing :1}                          Medical Decision Making Amount and/or Complexity of Data Reviewed Labs: ordered. Radiology: ordered.  Risk Prescription drug management. Decision regarding hospitalization.   Patient with an AKI and hypokalemia and most  likely urinary tract infection.  He will be admitted to medicine  {Document critical care time when appropriate:1} {Document review of labs and clinical decision tools ie heart score, Chads2Vasc2 etc:1}  {Document your independent review of radiology images, and any outside records:1} {Document your discussion with family members, caretakers, and with consultants:1} {Document social determinants of health affecting pt's care:1} {Document your decision making why or why not admission, treatments were needed:1} Final Clinical Impression(s) / ED Diagnoses Final diagnoses:  AKI (acute kidney injury) (HCC)    Rx / DC Orders ED Discharge Orders     None

## 2023-01-01 NOTE — ED Notes (Signed)
ED TO INPATIENT HANDOFF REPORT  ED Nurse Name and Phone #: Dessa Phi  S Name/Age/Gender Fred [name] 72 y.o. male Room/Bed: WA10/WA10  Code Status   Code Status: Prior  Home/SNF/Other Boarding Home Patient oriented to: self Is this baseline? Yes   Triage Complete: Triage complete  Chief Complaint Acute UTI (urinary tract infection) [N39.0]  Triage Note Pt arrived via [facility] from a group home for hematuria and fever. He is non verbal and autistic. EMS does not have the name of the group home, however gave me the address ([address] [place] ). One of the care takers should be coming soon. He started antibiotics on Friday for a UTI.   Allergies No Known Allergies  Level of Care/Admitting Diagnosis ED Disposition     ED Disposition  Admit   Condition  --   Comment  Hospital Area[facility] [[id number]]  Level of Care: Telemetry [5]  Admit to tele based on following criteria: Monitor for Ischemic changes  Admit to tele based on following criteria: Other see comments  Comments: Hypokalemia  May admit patient to [facility] or [facility] if equivalent level of care is available:: No  Covid Evaluation: Asymptomatic - no recent exposure (last 10 days) testing not required  Diagnosis: Acute UTI (urinary tract infection) [161096]  Admitting Physician: Bobette Mo [[id number]]  Attending Physician: Bobette Mo [[id number]]  Certification:: I certify this patient will need inpatient services for at least 2 midnights  Estimated Length of Stay: 2          B Medical/Surgery History Past Medical History:  Diagnosis Date   Diabetes mellitus    Hypertension    Mental retardation    No past surgical history on file.   A IV Location/Drains/Wounds Patient Lines/Drains/Airways Status     Active Line/Drains/Airways     Name Placement date Placement time Site Days   Peripheral IV 01/01/23 20 G Anterior;Proximal;Right Forearm  01/01/23  0900  Forearm  less than 1   Urethral Catheter Kristie Bracewell B 16 Fr. 01/01/23  1406  --  less than 1            Intake/Output Last 24 hours  Intake/Output Summary (Last 24 hours) at 01/01/2023 1407 Last data filed at 01/01/2023 1400 Gross per 24 hour  Intake --  Output 1100 ml  Net -1100 ml    Labs/Imaging Results for orders placed or performed during the hospital encounter of 01/01/23 (from the past 48 hour(s))  CBC with Differential     Status: Abnormal   Collection Time: 01/01/23  8:59 AM  Result Value Ref Range   WBC 10.0 4.0 - 10.5 K/uL   RBC 4.03 (L) 4.22 - 5.81 MIL/uL   Hemoglobin 11.8 (L) 13.0 - 17.0 g/dL   HCT 29.5 (L) 62.1 - 30.8 %   MCV 91.6 80.0 - 100.0 fL   MCH 29.3 26.0 - 34.0 pg   MCHC 32.0 30.0 - 36.0 g/dL   RDW 65.7 84.6 - 96.2 %   Platelets 158 150 - 400 K/uL   nRBC 0.0 0.0 - 0.2 %   Neutrophils Relative % 89 %   Neutro Abs 8.9 (H) 1.7 - 7.7 K/uL   Lymphocytes Relative 4 %   Lymphs Abs 0.4 (L) 0.7 - 4.0 K/uL   Monocytes Relative 6 %   Monocytes Absolute 0.6 0.1 - 1.0 K/uL   Eosinophils Relative 0 %   Eosinophils Absolute 0.0 0.0 - 0.5 K/uL   Basophils  Relative 0 %   Basophils Absolute 0.0 0.0 - 0.1 K/uL   Immature Granulocytes 1 %   Abs Immature Granulocytes 0.06 0.00 - 0.07 K/uL    Comment: Performed at [facility], [address], [place], [place] [address]  Comprehensive metabolic panel     Status: Abnormal   Collection Time: 01/01/23  8:59 AM  Result Value Ref Range   Sodium 139 135 - 145 mmol/L   Potassium 2.9 (L) 3.5 - 5.1 mmol/L   Chloride 104 98 - 111 mmol/L   CO2 23 22 - 32 mmol/L   Glucose, Bld 147 (H) 70 - 99 mg/dL    Comment: Glucose reference range applies only to samples taken after fasting for at least 8 hours.   BUN 36 (H) 8 - 23 mg/dL   Creatinine, Ser 6.04 (H) 0.61 - 1.24 mg/dL   Calcium 8.9 8.9 - 54.0 mg/dL   Total Protein 7.1 6.5 - 8.1 g/dL   Albumin 3.5 3.5 - 5.0 g/dL   AST 18 15 - 41 U/L   ALT  13 0 - 44 U/L   Alkaline Phosphatase 48 38 - 126 U/L   Total Bilirubin 1.0 0.3 - 1.2 mg/dL   GFR, Estimated 41 (L) >60 mL/min    Comment: (NOTE) Calculated using the CKD-EPI Creatinine Equation (2021)    Anion gap 12 5 - 15    Comment: Performed at [facility], [address], [place], [place] [address]  Urinalysis, Routine w reflex microscopic -Urine, Clean Catch     Status: Abnormal (Preliminary result)   Collection Time: 01/01/23  9:26 AM  Result Value Ref Range   Color, Urine PENDING YELLOW   APPearance CLOUDY (A) CLEAR   Specific Gravity, Urine 1.011 1.005 - 1.030   pH 7.0 5.0 - 8.0   Glucose, UA NEGATIVE NEGATIVE mg/dL   Hgb urine dipstick LARGE (A) NEGATIVE   Bilirubin Urine NEGATIVE NEGATIVE   Ketones, ur NEGATIVE NEGATIVE mg/dL   Protein, ur >=147 (A) NEGATIVE mg/dL   Nitrite NEGATIVE NEGATIVE   Leukocytes,Ua LARGE (A) NEGATIVE   RBC / HPF >50 0 - 5 RBC/hpf   WBC, UA >50 0 - 5 WBC/hpf   Bacteria, UA NONE SEEN NONE SEEN   Squamous Epithelial / HPF 0-5 0 - 5 /HPF   WBC Clumps PRESENT    Mucus PRESENT     Comment: Performed at [facility], [address], [place], [place] [address]  Lactic acid, plasma     Status: None   Collection Time: 01/01/23 10:05 AM  Result Value Ref Range   Lactic Acid, Venous 1.0 0.5 - 1.9 mmol/L    Comment: Performed at [facility], [address] [place], [place] [address]  Lactic acid, plasma     Status: None   Collection Time: 01/01/23 12:05 PM  Result Value Ref Range   Lactic Acid, Venous 0.9 0.5 - 1.9 mmol/L    Comment: Performed at [facility], [address], [place], [place] [address]   CT Head Wo Contrast  Result Date: 01/01/2023 CLINICAL DATA:  Memory loss. EXAM: CT HEAD WITHOUT CONTRAST TECHNIQUE: Contiguous axial images were obtained from the base of the skull through the vertex without intravenous contrast. RADIATION DOSE REDUCTION: This exam was  performed according to the departmental dose-optimization program which includes automated exposure control, adjustment of the mA and/or kV according to patient size and/or use of iterative reconstruction technique. COMPARISON:  Head  CT 11/01/2022. FINDINGS: Brain: No acute hemorrhage. Unchanged moderate chronic small-vessel disease. Cortical gray-white differentiation is otherwise preserved. Prominence of the ventricles and sulci within expected range for age. No hydrocephalus or extra-axial collection. No mass effect or midline shift. Vascular: No hyperdense vessel or unexpected calcification. Skull: No calvarial fracture or suspicious bone lesion. Skull base is unremarkable. Sinuses/Orbits: Unremarkable. Other: None. IMPRESSION: 1. No acute intracranial abnormality. 2. Unchanged moderate chronic small-vessel disease. Electronically Signed   By: Orvan Falconer M.D.   On: 01/01/2023 12:34    Pending Labs Unresulted Labs (From admission, onward)    None       Vitals/Pain Today's Vitals   01/01/23 0839 01/01/23 0840 01/01/23 1230  BP:  124/73   Pulse: 97    Resp: 18    Temp: (!) 97.5 F (36.4 C)  98.1 F (36.7 C)  TempSrc: Oral  Oral  SpO2: 98%      Isolation Precautions No active isolations  Medications Medications  sodium chloride 0.9 % bolus 500 mL (0 mLs Intravenous Stopped 01/01/23 0934)  sodium chloride 0.9 % bolus 1,000 mL (0 mLs Intravenous Stopped 01/01/23 1113)  cefTRIAXone (ROCEPHIN) 2 g in sodium chloride 0.9 % 100 mL IVPB (0 g Intravenous Stopped 01/01/23 1039)  potassium chloride SA (KLOR-CON M) CR tablet 40 mEq (40 mEq Oral Given 01/01/23 1304)    Mobility walks     Focused Assessments Cardiac Assessment Handoff:    No results found for: "CKTOTAL", "CKMB", "CKMBINDEX", "TROPONINI" No results found for: "DDIMER" Does the Patient currently have chest pain? No    R Recommendations: See Admitting Provider Note  Report given to:   Additional Notes:  Pt is  autistic and mostly non verbal

## 2023-01-01 NOTE — ED Notes (Addendum)
Patient caregiver came in from [facility] and stated that pt's guardian is on the way. He also stated that he took the patient to his urologist on Friday because blood in urine. Urologist prescribed a broad spectrum antibiotic and said he may have to change it when the urine cultures come in.

## 2023-01-02 DIAGNOSIS — N390 Urinary tract infection, site not specified: Secondary | ICD-10-CM | POA: Diagnosis not present

## 2023-01-02 LAB — COMPREHENSIVE METABOLIC PANEL
ALT: 13 U/L (ref 0–44)
AST: 13 U/L — ABNORMAL LOW (ref 15–41)
Albumin: 2.8 g/dL — ABNORMAL LOW (ref 3.5–5.0)
Alkaline Phosphatase: 41 U/L (ref 38–126)
Anion gap: 8 (ref 5–15)
BUN: 30 mg/dL — ABNORMAL HIGH (ref 8–23)
CO2: 24 mmol/L (ref 22–32)
Calcium: 8.4 mg/dL — ABNORMAL LOW (ref 8.9–10.3)
Chloride: 112 mmol/L — ABNORMAL HIGH (ref 98–111)
Creatinine, Ser: 1.2 mg/dL (ref 0.61–1.24)
GFR, Estimated: >60 mL/min (ref >60)
Glucose, Bld: 111 mg/dL — ABNORMAL HIGH (ref 70–99)
Potassium: 3 mmol/L — ABNORMAL LOW (ref 3.5–5.1)
Sodium: 144 mmol/L (ref 135–145)
Total Bilirubin: 0.7 mg/dL (ref 0.3–1.2)
Total Protein: 5.9 g/dL — ABNORMAL LOW (ref 6.5–8.1)

## 2023-01-02 LAB — GLUCOSE, CAPILLARY
Glucose-Capillary: 92 mg/dL (ref 70–99)
Glucose-Capillary: 89 mg/dL (ref 70–99)
Glucose-Capillary: 87 mg/dL (ref 70–99)
Glucose-Capillary: 133 mg/dL — ABNORMAL HIGH (ref 70–99)

## 2023-01-02 LAB — CBC
HCT: 30.7 % — ABNORMAL LOW (ref 39.0–52.0)
Hemoglobin: 9.8 g/dL — ABNORMAL LOW (ref 13.0–17.0)
MCH: 29.8 pg (ref 26.0–34.0)
MCHC: 31.9 g/dL (ref 30.0–36.0)
MCV: 93.3 fL (ref 80.0–100.0)
Platelets: 127 10*3/uL — ABNORMAL LOW (ref 150–400)
RBC: 3.29 MIL/uL — ABNORMAL LOW (ref 4.22–5.81)
RDW: 14.6 % (ref 11.5–15.5)
WBC: 6.9 10*3/uL (ref 4.0–10.5)
nRBC: 0 % (ref 0.0–0.2)

## 2023-01-02 LAB — MAGNESIUM: Magnesium: 1.9 mg/dL (ref 1.7–2.4)

## 2023-01-02 MED ORDER — POTASSIUM CHLORIDE CRYS ER 20 MEQ PO TBCR
40.0000 meq | EXTENDED_RELEASE_TABLET | ORAL | Status: AC
  Administered 2023-01-02 (×2): 40 meq via ORAL
  Filled 2023-01-02 (×2): qty 2

## 2023-01-02 NOTE — Progress Notes (Signed)
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  Patient is nonverbal and autistic.  Information was taken from the sister at bedside.  On presentation, urine was found to be cloudy, containing large hemoglobin, leukocyte esterase, leukocytes.  Likely acid was normal.  Lab work showed potassium of 2.9, creatinine of 1.7.  Patient was started on antibiotic, IV fluids  Assessment & Plan:  Principal Problem:   Acute UTI (urinary tract infection) Active Problems:   Acute metabolic encephalopathy   BPH with urinary obstruction   Hypertension associated with diabetes (HCC)   Type 2 diabetes mellitus without complication, without long-term current use of insulin (HCC)   Stage 3a chronic kidney disease (CKD) (HCC) - baseline SCr 1.3   Anemia   Hypokalemia   AKI (acute kidney injury) (HCC)   Acute urinary tract infection: Has chronic Foley placement.  Found to have fever, hematuria at the group home and was sent further.No leukocytosis or fever on presentation.  Lactate level normal.  Started on ceftriaxone.  Urine culture, blood culture sent.  He was seen by urology as an outpatient recently and was send urine culture but we cannot see anything on the care everywhere. Urine culture sent here today.  AKI on CKD stage IIIa: Last creatinine was 1 on 11/18/2022.  Presented with creatinine in the range of 1.7.  Creatinine improved to 1.2 already.  Started on gentle IV fluids.  Monitor  Hypokalemia: Currently being supplemented and monitor.  Magnesium level optimal  Hypertension: On lisinopril, hydrochlorothiazide at home.  Currently on hold.  Continue carvedilol, amlodipine.   Monitor blood pressure  Type 2 diabetes: On metformin at home.  Currently on sliding scale insulin.  Monitor blood sugars  Normocytic anemia: Currently hemoglobin stable        DVT prophylaxis:SCDs Start: 01/01/23 1514     Code Status: DNR  Family Communication: called and discussed with sister on phone on 5/6  Patient status:Inpatient  Patient is from :Group Home  Anticipated discharge AO:[facility]  Estimated DC date:tomorrow   Consultants: None  Procedures:None  Antimicrobials:  Anti-infectives (From admission, onward)    Start     Dose/Rate Route Frequency Ordered Stop   01/02/23 1000  cefTRIAXone (ROCEPHIN) 1 g in sodium chloride 0.9 % 100 mL IVPB        1 g 200 mL/hr over 30 Minutes Intravenous Every 24 hours 01/01/23 1502     01/01/23 1000  cefTRIAXone (ROCEPHIN) 2 g in sodium chloride 0.9 % 100 mL IVPB        2 g 200 mL/hr over 30 Minutes Intravenous  Once 01/01/23 5784 01/01/23 1039       Subjective: Patient seen and examined at bedside today.  Hemodynamically stable, lying on the bed.  He is alert and awake and tries to answer the question but his speech is not understandable.  Looks comfortable.  Denies any complaints.  Urine on the Foley bag looks clear  Objective: Vitals:   01/01/23 1400 01/01/23 1426 01/01/23 2029 01/02/23 0356  BP: 110/73 133/75 121/73 130/75  Pulse: 99 82 89 79  Resp: 16 16 20 20   Temp:   98.2 F (36.8 C) (!) 97.4 F (36.3 C)  TempSrc:  SpO2: 98% 97% 97% 96%    Intake/Output Summary (Last 24 hours) at 01/02/2023 0742 Last data filed at 01/02/2023 0402 Gross per 24 hour  Intake 240 ml  Output 4225 ml  Net -3985 ml   There were no vitals filed for this visit.  Examination:  General exam: Overall comfortable, not in distress HEENT: PERRL Respiratory system:  no wheezes or crackles  Cardiovascular system: S1 & S2 heard, RRR.  Gastrointestinal system: Abdomen is nondistended, soft and nontender. Central nervous  system: Alert and awake Extremities: No edema, no clubbing ,no cyanosis Skin: No rashes, no ulcers,no icterus   GU: Foley   Data Reviewed: I have personally reviewed following labs and imaging studies  CBC: Recent Labs  Lab 01/01/23 0859 01/02/23 0627  WBC 10.0 6.9  NEUTROABS 8.9*  --   HGB 11.8* 9.8*  HCT 36.9* 30.7*  MCV 91.6 93.3  PLT 158 127*   Basic Metabolic Panel: Recent Labs  Lab 01/01/23 0859 01/02/23 0627  NA 139 144  K 2.9* 3.0*  CL 104 112*  CO2 23 24  GLUCOSE 147* 111*  BUN 36* 30*  CREATININE 1.73* 1.20  CALCIUM 8.9 8.4*     No results found for this or any previous visit (from the past 240 hour(s)).   Radiology Studies: CT Head Wo Contrast  Result Date: 01/01/2023 CLINICAL DATA:  Memory loss. EXAM: CT HEAD WITHOUT CONTRAST TECHNIQUE: Contiguous axial images were obtained from the base of the skull through the vertex without intravenous contrast. RADIATION DOSE REDUCTION: This exam was performed according to the departmental dose-optimization program which includes automated exposure control, adjustment of the mA and/or kV according to patient size and/or use of iterative reconstruction technique. COMPARISON:  Head CT 11/01/2022. FINDINGS: Brain: No acute hemorrhage. Unchanged moderate chronic small-vessel disease. Cortical gray-white differentiation is otherwise preserved. Prominence of the ventricles and sulci within expected range for age. No hydrocephalus or extra-axial collection. No mass effect or midline shift. Vascular: No hyperdense vessel or unexpected calcification. Skull: No calvarial fracture or suspicious bone lesion. Skull base is unremarkable. Sinuses/Orbits: Unremarkable. Other: None. IMPRESSION: 1. No acute intracranial abnormality. 2. Unchanged moderate chronic small-vessel disease. Electronically Signed   By: Orvan Falconer M.D.   On: 01/01/2023 12:34    Scheduled Meds:  alfuzosin  10 mg Oral QPC breakfast   amLODipine  10 mg Oral Daily    benztropine  0.5 mg Oral Daily   carvedilol  12.5 mg Oral BID WC   ferrous sulfate  325 mg Oral Q M,W,F   insulin aspart  0-9 Units Subcutaneous TID WC   potassium chloride  40 mEq Oral Q2H   risperiDONE  0.5 mg Oral Daily   risperiDONE  3 mg Oral QHS   Continuous Infusions:  0.9 % NaCl with KCl 20 mEq / L 100 mL/hr at 01/02/23 0400   cefTRIAXone (ROCEPHIN)  IV       LOS: 1 day   Burnadette Pop, MD [facility] P5/01/2023, 7:42 AM

## 2023-01-02 NOTE — TOC Initial Note (Signed)
Transition of Care [facility]) - Initial/Assessment Note    Patient Details  Name: Fred [name] MRN: [id number] Date of Birth: Feb 01, 1951  [facility] CM/SW Contact:    Erin Sons, LCSW Phone Number: 01/02/2023, 1:52 PM  Clinical Narrative:                  Pt from "[facility]" group. CSW called Gwen Pounds pt's group home care giver and informed him of pt's anticipated DC tomorrow. They will be able to provide transportation at DC (available early afternoon). He requests new beds be sent to [facility] [address] [place]  [phone].  Expected Discharge Plan: Group Home Barriers to Discharge: Continued Medical Work up   Patient Goals and CMS Choice            Expected Discharge Plan and Services       Living arrangements for the past 2 months: Group Home                                      Prior Living Arrangements/Services Living arrangements for the past 2 months: Group Home                     Activities of Daily Living Home Assistive Devices/Equipment: Built-in shower seat ADL Screening (condition at time of admission) Patient's cognitive ability adequate to safely complete daily activities?: No Is the patient deaf or have difficulty hearing?: No (per sister) Does the patient have difficulty seeing, even when wearing glasses/contacts?: No Does the patient have difficulty concentrating, remembering, or making decisions?: No Patient able to express need for assistance with ADLs?: No Does the patient have difficulty dressing or bathing?: No Independently performs ADLs?: Yes (appropriate for developmental age) Does the patient have difficulty walking or climbing stairs?: No Weakness of Legs: Both Weakness of Arms/Hands: Both  Permission Sought/Granted                  Emotional Assessment              Admission diagnosis:  AKI (acute kidney injury) (HCC) [N17.9] Acute UTI (urinary tract infection)  [N39.0] Patient Active Problem List   Diagnosis Date Noted   Acute UTI (urinary tract infection) 01/01/2023   Acute cystitis without hematuria 11/02/2022   Acute metabolic encephalopathy 11/02/2022   Stage 3a chronic kidney disease (CKD) (HCC) - baseline SCr 1.3 11/02/2022   Hypokalemia 11/02/2022   DNR (do not resuscitate)/DNI(Do Not Intubate) 11/02/2022   Severe sepsis (HCC) 11/02/2022   AKI (acute kidney injury) (HCC) 11/02/2022   Xerosis of skin 12/29/2021   Pain due to onychomycosis of toenails of both feet 02/08/2019   Aortic ectasia, abdominal (HCC) 04/23/2018   Anemia 08/06/2017   Elevated prostate specific antigen (PSA) 06/29/2014   Incomplete emptying of bladder 06/29/2014   Urinary incontinence 06/29/2014   BPH with urinary obstruction 06/27/2014   Hypertension associated with diabetes (HCC) 09/19/2012   Intellectual disability 09/19/2012   Type 2 diabetes mellitus without complication, without long-term current use of insulin (HCC) 09/19/2012   PCP:  Tracey Harries, MD Pharmacy:   [facility] - [place], [place] - [address] [place] [address] Phone: [phone] Fax: [phone]     Social Determinants of Health (SDOH) Social History: SDOH Screenings   Food Insecurity: No Food Insecurity (01/01/2023)  Housing: Low Risk  (01/01/2023)  Transportation Needs: No Transportation Needs (01/01/2023)  Utilities: Not At Risk (01/01/2023)  Tobacco Use: Medium Risk (01/01/2023)   SDOH Interventions:     Readmission Risk Interventions     No data to display

## 2023-01-03 DIAGNOSIS — N390 Urinary tract infection, site not specified: Secondary | ICD-10-CM | POA: Diagnosis not present

## 2023-01-03 LAB — CBC
HCT: 29.8 % — ABNORMAL LOW (ref 39.0–52.0)
Hemoglobin: 9.4 g/dL — ABNORMAL LOW (ref 13.0–17.0)
MCH: 29.4 pg (ref 26.0–34.0)
MCHC: 31.5 g/dL (ref 30.0–36.0)
MCV: 93.1 fL (ref 80.0–100.0)
Platelets: 134 10*3/uL — ABNORMAL LOW (ref 150–400)
RBC: 3.2 MIL/uL — ABNORMAL LOW (ref 4.22–5.81)
RDW: 14.3 % (ref 11.5–15.5)
WBC: 4.9 10*3/uL (ref 4.0–10.5)
nRBC: 0 % (ref 0.0–0.2)

## 2023-01-03 LAB — URINE CULTURE: Culture: NO GROWTH

## 2023-01-03 LAB — BASIC METABOLIC PANEL
Anion gap: 8 (ref 5–15)
BUN: 20 mg/dL (ref 8–23)
CO2: 26 mmol/L (ref 22–32)
Calcium: 8.1 mg/dL — ABNORMAL LOW (ref 8.9–10.3)
Chloride: 106 mmol/L (ref 98–111)
Creatinine, Ser: 0.88 mg/dL (ref 0.61–1.24)
GFR, Estimated: >60 mL/min (ref >60)
Glucose, Bld: 89 mg/dL (ref 70–99)
Potassium: 3 mmol/L — ABNORMAL LOW (ref 3.5–5.1)
Sodium: 140 mmol/L (ref 135–145)

## 2023-01-03 LAB — GLUCOSE, CAPILLARY
Glucose-Capillary: 93 mg/dL (ref 70–99)
Glucose-Capillary: 86 mg/dL (ref 70–99)

## 2023-01-03 MED ORDER — CIPROFLOXACIN HCL 500 MG PO TABS: 500.0000 mg | ORAL_TABLET | Freq: Two times a day (BID) | ORAL | 0 refills | Status: AC

## 2023-01-03 MED ORDER — POTASSIUM CHLORIDE CRYS ER 20 MEQ PO TBCR: 40.0000 meq | EXTENDED_RELEASE_TABLET | Freq: Every day | ORAL | 0 refills | Status: DC

## 2023-01-03 MED ORDER — CIPROFLOXACIN HCL 500 MG PO TABS
500.0000 mg | ORAL_TABLET | Freq: Two times a day (BID) | ORAL | Status: DC
  Administered 2023-01-03: 500 mg via ORAL
  Filled 2023-01-03: qty 1

## 2023-01-03 MED ORDER — CARVEDILOL 6.25 MG PO TABS: 6.2500 mg | ORAL_TABLET | Freq: Two times a day (BID) | ORAL | Status: DC

## 2023-01-03 MED ORDER — CARVEDILOL 6.25 MG PO TABS: 6.2500 mg | ORAL_TABLET | Freq: Two times a day (BID) | ORAL | 0 refills | Status: AC

## 2023-01-03 MED ORDER — CHLORHEXIDINE GLUCONATE CLOTH 2 % EX PADS
6.0000 | MEDICATED_PAD | Freq: Every day | CUTANEOUS | Status: DC
  Administered 2023-01-03: 6 via TOPICAL

## 2023-01-03 MED ORDER — POTASSIUM CHLORIDE CRYS ER 20 MEQ PO TBCR
40.0000 meq | EXTENDED_RELEASE_TABLET | ORAL | Status: AC
  Administered 2023-01-03 (×2): 40 meq via ORAL
  Filled 2023-01-03 (×2): qty 2

## 2023-01-03 NOTE — Discharge Summary (Signed)
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  Patient is nonverbal and autistic.  Information was taken from the sister at bedside.  On presentation, urine was found to be cloudy, containing large hemoglobin, leukocyte esterase, leukocytes.  Lactic acid  was normal.  Lab work showed potassium of 2.9, creatinine of 1.7.  Patient was started on antibiotic, IV fluids . Kidney function has completely normalized.  Urine culture that was sent on 12/30/2022 as an outpatient shown Pseudomonas, sensitive to Cipro.  He does not have any signs of sepsis.  He is afebrile, kidney function is completely normalized.  Medically stable for discharge to home today with oral antibiotics  Following problems were addressed during the hospitalization:  Acute urinary tract infection: Has chronic Foley placement.  Found to have fever, hematuria at the group home and was sent further.No leukocytosis or fever on presentation.  Lactate level normal.  Started on ceftriaxone initially.  Urine culture that was sent on 12/30/2022 as an outpatient shown Pseudomonas, sensitive to Cipro.  He does not have any signs of sepsis.  He will be discharged on ciprofloxacin.  AKI on CKD stage IIIa: Last creatinine was 1 on 11/18/2022.  Presented with creatinine in the range of 1.7.  Kidney function normalized with IV fluids  Hypokalemia: Currently being supplemented    Hypertension: On lisinopril, hydrochlorothiazide ,  carvedilol, amlodipine.  Blood pressure soft /currently stable.  Will continue amlodipine, continue Coreg at low-dose   Type 2 diabetes: On metformin at home.     Normocytic anemia: Currently hemoglobin stable   Discharge Diagnoses:  Principal Problem:   Acute UTI (urinary tract infection) Active Problems:   Acute metabolic encephalopathy   BPH with urinary obstruction   Hypertension associated with diabetes (HCC)   Type 2 diabetes mellitus without complication, without long-term current use of insulin (HCC)   Stage 3a chronic kidney disease (CKD) (HCC) - baseline SCr 1.3   Anemia   Hypokalemia   AKI (acute kidney injury) [facility])    Discharge Instructions  Discharge Instructions     Diet - low sodium heart healthy   Complete by: As directed    Discharge instructions   Complete by: As directed    1)Please take prescribed medications as instructed 2)Follow up with your PCP in a week.Do a Bmp test during the follow up 3)Monitor your blood pressure   Increase activity slowly   Complete by: As directed       Allergies as of 01/03/2023   No Known Allergies      Medication List     STOP taking these medications    cefdinir 300 MG capsule Commonly known as: OMNICEF   lisinopril-hydrochlorothiazide 20-25 MG tablet Commonly known as: ZESTORETIC       TAKE these medications    alfuzosin 10 MG 24 hr tablet Commonly known as: UROXATRAL Take 10 mg by mouth daily after breakfast.   Allergy Relief 10 MG tablet Generic drug: loratadine Take 10 mg by mouth daily.   amLODipine 10 MG tablet Commonly known as: NORVASC Take 10 mg by  mouth daily.   aspirin EC 81 MG tablet Take 81 mg by mouth daily.   atorvastatin 20 MG tablet Commonly known as: LIPITOR Take 20 mg by mouth daily.   benztropine 0.5 MG tablet Commonly known as: COGENTIN Take 0.5 mg by mouth daily.   carvedilol 6.25 MG tablet Commonly known as: COREG Take 1 tablet (6.25 mg total) by mouth 2 (two)  times daily with a meal. What changed:  medication strength how much to take   ciprofloxacin 500 MG tablet Commonly known as: CIPRO Take 1 tablet (500 mg total) by mouth 2 (two) times daily for 5 days.   ferrous sulfate 325 (65 FE) MG tablet Take 325 mg by mouth every Monday, Wednesday, and Friday.   finasteride 5 MG tablet Commonly known as: Proscar Take 1 tablet (5 mg total) by mouth daily.   ketoconazole 2 % shampoo Commonly known as: NIZORAL Apply 1 Application topically 2 (two) times a week.   metFORMIN 500 MG tablet Commonly known as: GLUCOPHAGE Take 500 mg by mouth 2 (two) times daily.   potassium chloride SA 20 MEQ tablet Commonly known as: KLOR-CON M Take 2 tablets (40 mEq total) by mouth daily for 5 days. Start taking on: Jan 04, 2023   risperiDONE 0.5 MG tablet Commonly known as: RISPERDAL Take 0.5 mg by mouth daily.   risperiDONE 3 MG tablet Commonly known as: RISPERDAL Take 3 mg by mouth at bedtime.   sertraline 50 MG tablet Commonly known as: ZOLOFT Take 50 mg by mouth daily.        Follow-up Information     Tracey Harries, MD. Schedule an appointment as soon as possible for a visit in 1 week(s).   Specialty: Family Medicine Contact information: [address] [place] [address] [phone]                No Known Allergies  Consultations: None   Procedures/Studies: CT Head Wo Contrast  Result Date: 01/01/2023 CLINICAL DATA:  Memory loss. EXAM: CT HEAD WITHOUT CONTRAST TECHNIQUE: Contiguous axial images were obtained from the base of the skull through the vertex without intravenous contrast. RADIATION DOSE REDUCTION: This exam was performed according to the departmental dose-optimization program which includes automated exposure control, adjustment of the mA and/or kV according to patient size and/or use of iterative reconstruction technique. COMPARISON:  Head CT 11/01/2022. FINDINGS: Brain: No acute hemorrhage.  Unchanged moderate chronic small-vessel disease. Cortical gray-white differentiation is otherwise preserved. Prominence of the ventricles and sulci within expected range for age. No hydrocephalus or extra-axial collection. No mass effect or midline shift. Vascular: No hyperdense vessel or unexpected calcification. Skull: No calvarial fracture or suspicious bone lesion. Skull base is unremarkable. Sinuses/Orbits: Unremarkable. Other: None. IMPRESSION: 1. No acute intracranial abnormality. 2. Unchanged moderate chronic small-vessel disease. Electronically Signed   By: Orvan Falconer M.D.   On: 01/01/2023 12:34      Subjective: Patient seen and examined the bedside today.  Hemodynamically stable.  Comfortable.  Denies any complaints today.  Medically stable for discharge to group home  Discharge Exam: Vitals:   01/02/23 1954 01/03/23 0528  BP: 117/71 104/62  Pulse: (!) 57 (!) 58  Resp: 16 16  Temp: 97.8 F (36.6 C) 97.7 F (36.5 C)  SpO2: 100% 96%   Vitals:   01/02/23 0356 01/02/23 1246 01/02/23 1954 01/03/23 0528  BP: 130/75 117/70 117/71 104/62  Pulse: 79 67 (!) 57 (!) 58  Resp: 20 18 16 16   Temp: Marland Kitchen)  97.4 F (36.3 C) (!) 97.5 F (36.4 C) 97.8 F (36.6 C) 97.7 F (36.5 C)  TempSrc:  Oral Oral Oral  SpO2: 96% 98% 100% 96%    General: Pt is alert, awake, not in acute distress Cardiovascular: RRR, S1/S2 +, no rubs, no gallops Respiratory: CTA bilaterally, no wheezing, no rhonchi Abdominal: Soft, NT, ND, bowel sounds + Extremities: no edema, no cyanosis    The results of significant diagnostics from this hospitalization (including imaging, microbiology, ancillary and laboratory) are listed below for reference.     Microbiology: Recent Results (from the past 240 hour(s))  Urine Culture (for pregnant, neutropenic or urologic patients or patients with an indwelling urinary catheter)     Status: None   Collection Time: 01/02/23 10:25 AM   Specimen: Urine, Catheterized  Result  Value Ref Range Status   Specimen Description   Final    URINE, CATHETERIZED Performed at [facility], [address] [place], [place] [address]    Special Requests   Final    NONE Performed at [facility], [address] [place], [place] [address]    Culture   Final    NO GROWTH Performed at [facility], [address], [place], [place] [address]    Report Status 01/03/2023 FINAL  Final     Labs: BNP (last 3 results) No results for input(s): "BNP" in the last 8760 hours. Basic Metabolic Panel: Recent Labs  Lab 01/01/23 0859 01/02/23 0627 01/03/23 0537  NA 139 144 140  K 2.9* 3.0* 3.0*  CL 104 112* 106  CO2 23 24 26   GLUCOSE 147* 111* 89  BUN 36* 30* 20  CREATININE 1.73* 1.20 0.88  CALCIUM 8.9 8.4* 8.1*  MG  --  1.9  --    Liver Function Tests: Recent Labs  Lab 01/01/23 0859 01/02/23 0627  AST 18 13*  ALT 13 13  ALKPHOS 48 41  BILITOT 1.0 0.7  PROT 7.1 5.9*  ALBUMIN 3.5 2.8*   No results for input(s): "LIPASE", "AMYLASE" in the last 168 hours. No results for input(s): "AMMONIA" in the last 168 hours. CBC: Recent Labs  Lab 01/01/23 0859 01/02/23 0627 01/03/23 0537  WBC 10.0 6.9 4.9  NEUTROABS 8.9*  --   --   HGB 11.8* 9.8* 9.4*  HCT 36.9* 30.7* 29.8*  MCV 91.6 93.3 93.1  PLT 158 127* 134*   Cardiac Enzymes: Recent Labs  Lab 01/01/23 0859  CKTOTAL 107   BNP: Invalid input(s): "POCBNP" CBG: Recent Labs  Lab 01/02/23 0752 01/02/23 1129 01/02/23 1639 01/02/23 1950 01/03/23 0726  GLUCAP 92 89 87 133* 93   D-Dimer No results for input(s): "DDIMER" in the last 72 hours. Hgb A1c No results for input(s): "HGBA1C" in the last 72 hours. Lipid Profile No results for input(s): "CHOL", "HDL", "LDLCALC", "TRIG", "CHOLHDL", "LDLDIRECT" in the last 72 hours. Thyroid function studies No results for input(s): "TSH", "T4TOTAL", "T3FREE", "THYROIDAB" in the last 72 hours.  Invalid input(s):  "FREET3" Anemia work up No results for input(s): "VITAMINB12", "FOLATE", "FERRITIN", "TIBC", "IRON", "RETICCTPCT" in the last 72 hours. Urinalysis    Component Value Date/Time   COLORURINE RED (A) 01/01/2023 0926   APPEARANCEUR CLOUDY (A) 01/01/2023 0926   LABSPEC 1.011 01/01/2023 0926   PHURINE 7.0 01/01/2023 0926   GLUCOSEU NEGATIVE 01/01/2023 0926   HGBUR LARGE (A) 01/01/2023 0926   BILIRUBINUR NEGATIVE 01/01/2023 0926   KETONESUR NEGATIVE 01/01/2023 0926   PROTEINUR >=300 (A) 01/01/2023 9147  UROBILINOGEN 1.0 02/27/2012 1053   NITRITE NEGATIVE 01/01/2023 0926   LEUKOCYTESUR LARGE (A) 01/01/2023 0926   Sepsis Labs Recent Labs  Lab 01/01/23 0859 01/02/23 0627 01/03/23 0537  WBC 10.0 6.9 4.9   Microbiology Recent Results (from the past 240 hour(s))  Urine Culture (for pregnant, neutropenic or urologic patients or patients with an indwelling urinary catheter)     Status: None   Collection Time: 01/02/23 10:25 AM   Specimen: Urine, Catheterized  Result Value Ref Range Status   Specimen Description   Final    URINE, CATHETERIZED Performed at [facility], [address], [place], [place] [address]    Special Requests   Final    NONE Performed at [facility], [address] [place], [place] [address]    Culture   Final    NO GROWTH Performed at [facility], [address] [place], [place] [address]    Report Status 01/03/2023 FINAL  Final    Please note: You were cared for by a hospitalist during your hospital stay. Once you are discharged, your primary care physician will handle any further medical issues. Please note that NO REFILLS for any discharge medications will be authorized once you are discharged, as it is imperative that you return to your primary care physician (or establish a relationship with a primary care physician if you do not have one) for your post hospital discharge needs so that they can reassess your  need for medications and monitor your lab values.    Time coordinating discharge: 40 minutes  SIGNED:   Burnadette Pop, MD  [facility] 01/03/2023, 10:44 AM Pager [phone]  If 7PM-7AM, please contact night-coverage [email or web address] Password [email or web address]

## 2023-01-03 NOTE — TOC Transition Note (Signed)
Transition of Care [facility]) - CM/SW Discharge Note   Patient Details  Name: Fred [name] MRN: [id number] Date of Birth: 07/04/1951  Transition of Care [facility]) CM/SW Contact:  Erin Sons, LCSW Phone Number: 01/03/2023, 12:19 PM   Clinical Narrative:     CSW spoke with group home care giver, Fred [name]. He will bring pt clothing and provide transportation home. Scripts sent to preferred pharmacy. DC summary printed and in DC packet.     Barriers to Discharge: No Barriers Identified   Patient Goals and CMS Choice      Discharge Placement                         Discharge Plan and Services Additional resources added to the After Visit Summary for                                       Social Determinants of Health (SDOH) Interventions SDOH Screenings   Food Insecurity: No Food Insecurity (01/01/2023)  Housing: Low Risk  (01/01/2023)  Transportation Needs: No Transportation Needs (01/01/2023)  Utilities: Not At Risk (01/01/2023)  Tobacco Use: Medium Risk (01/01/2023)     Readmission Risk Interventions     No data to display

## 2023-01-30 ENCOUNTER — Ambulatory Visit: Payer: Medicare Other | Admitting: Podiatry

## 2023-02-01 ENCOUNTER — Encounter: Payer: Self-pay | Admitting: Podiatry

## 2023-02-01 ENCOUNTER — Ambulatory Visit (INDEPENDENT_AMBULATORY_CARE_PROVIDER_SITE_OTHER): Payer: Medicare Other | Admitting: Podiatry

## 2023-02-01 DIAGNOSIS — S90221A Contusion of right lesser toe(s) with damage to nail, initial encounter: Secondary | ICD-10-CM

## 2023-02-01 DIAGNOSIS — B351 Tinea unguium: Secondary | ICD-10-CM | POA: Diagnosis not present

## 2023-02-01 DIAGNOSIS — E119 Type 2 diabetes mellitus without complications: Secondary | ICD-10-CM | POA: Diagnosis not present

## 2023-02-01 DIAGNOSIS — M79675 Pain in left toe(s): Secondary | ICD-10-CM

## 2023-02-01 DIAGNOSIS — M79674 Pain in right toe(s): Secondary | ICD-10-CM | POA: Diagnosis not present

## 2023-02-01 NOTE — Progress Notes (Signed)
This patient returns to my office for at risk foot care.  This patient requires this care by a professional since this patient will be at risk due to having diabetes.  patient is unable to cut nails himself since the patient cannot reach his nails.These nails are painful walking and wearing shoes.  Patient presents to the office with a caregiver. The caregiver says he noticed bleeding coming from under his big toenail right foot.yesterday.This patient presents for at risk foot care today.  General Appearance  Alert, conversant and in no acute stress.  Vascular  Dorsalis pedis and posterior tibial  pulses are palpable  bilaterally.  Capillary return is within normal limits  bilaterally. Temperature is within normal limits  bilaterally.  Neurologic  Senn-Weinstein monofilament wire test within normal limits  bilaterally. Muscle power within normal limits bilaterally.  Nails Thick disfigured discolored nails with subungual debris  from hallux to fifth toes bilaterally. No evidence of bacterial infection or drainage bilaterally. The right hallux toenail is injured and unattached to the nail bed.  No redness or pus noted.  Orthopedic  No limitations of motion  feet .  No crepitus or effusions noted.  No bony pathology or digital deformities noted.  Skin  normotropic skin with no porokeratosis noted bilaterally.  No signs of infections or ulcers noted.     Onychomycosis  Pain in right toes  Pain in left toes  Contusion/subungual hematoma right hallux.    Consent was obtained for treatment procedures.   Mechanical debridement of nails 1-5  bilaterally performed with a nail nipper.  Filed with dremel without incident. The nail plate right hallux was removed.  The nail bed was cleaned.  The nail bed was bandaged with neosporin/DSD and coban.  Told the caretaker to peroxide the nail bed until healing occurs.  Call the office if his condition worsens.   Return office visit    4 months                 Told  patient to return for periodic foot care and evaluation due to potential at risk complications.   Helane Gunther DPM   diabetes.

## 2023-07-12 ENCOUNTER — Ambulatory Visit: Payer: Medicare Other | Admitting: Podiatry

## 2023-07-19 ENCOUNTER — Ambulatory Visit: Payer: Medicare Other | Admitting: Podiatry

## 2023-07-26 ENCOUNTER — Ambulatory Visit: Payer: Medicare Other | Admitting: Podiatry

## 2023-07-26 ENCOUNTER — Encounter: Payer: Self-pay | Admitting: Podiatry

## 2023-07-26 DIAGNOSIS — B351 Tinea unguium: Secondary | ICD-10-CM

## 2023-07-26 DIAGNOSIS — M79675 Pain in left toe(s): Secondary | ICD-10-CM | POA: Diagnosis not present

## 2023-07-26 DIAGNOSIS — M79674 Pain in right toe(s): Secondary | ICD-10-CM

## 2023-07-26 DIAGNOSIS — E119 Type 2 diabetes mellitus without complications: Secondary | ICD-10-CM | POA: Diagnosis not present

## 2023-07-26 NOTE — Progress Notes (Signed)
This patient returns to my office for at risk foot care.  This patient requires this care by a professional since this patient will be at risk due to having diabetes.  patient is unable to cut nails himself since the patient cannot reach his nails.These nails are painful walking and wearing shoes.  Patient presents to the office with a caregiver. The caregiver says he noticed bleeding coming from under his big toenail right foot.yesterday.This patient presents for at risk foot care today.  General Appearance  Alert, conversant and in no acute stress.  Vascular  Dorsalis pedis and posterior tibial  pulses are palpable  bilaterally.  Capillary return is within normal limits  bilaterally. Temperature is within normal limits  bilaterally.  Neurologic  Senn-Weinstein monofilament wire test within normal limits  bilaterally. Muscle power within normal limits bilaterally.  Nails Thick disfigured discolored nails with subungual debris  from hallux to fifth toes bilaterally. No evidence of bacterial infection or drainage bilaterally. The right hallux toenail is injured and unattached to the nail bed.  No redness or pus noted.  Orthopedic  No limitations of motion  feet .  No crepitus or effusions noted.  No bony pathology or digital deformities noted.  Skin  normotropic skin with no porokeratosis noted bilaterally.  No signs of infections or ulcers noted.     Onychomycosis  Pain in right toes  Pain in left toes  Contusion/subungual hematoma right hallux.    Consent was obtained for treatment procedures.   Mechanical debridement of nails 1-5  bilaterally performed with a nail nipper.  Filed with dremel without incident.    Return office visit    4 months                 Told patient to return for periodic foot care and evaluation due to potential at risk complications.   Helane Gunther DPM   diabetes.

## 2023-11-09 ENCOUNTER — Ambulatory Visit: Payer: Medicare Other | Admitting: Podiatry

## 2023-11-16 ENCOUNTER — Ambulatory Visit: Payer: Medicare Other | Admitting: Podiatry

## 2023-11-16 ENCOUNTER — Ambulatory Visit: Admitting: Podiatry

## 2023-12-13 ENCOUNTER — Encounter: Payer: Self-pay | Admitting: Podiatry

## 2023-12-13 ENCOUNTER — Ambulatory Visit (INDEPENDENT_AMBULATORY_CARE_PROVIDER_SITE_OTHER): Admitting: Podiatry

## 2023-12-13 DIAGNOSIS — E119 Type 2 diabetes mellitus without complications: Secondary | ICD-10-CM

## 2023-12-13 DIAGNOSIS — B351 Tinea unguium: Secondary | ICD-10-CM | POA: Diagnosis not present

## 2023-12-13 DIAGNOSIS — M79674 Pain in right toe(s): Secondary | ICD-10-CM

## 2023-12-13 DIAGNOSIS — M79675 Pain in left toe(s): Secondary | ICD-10-CM | POA: Diagnosis not present

## 2023-12-13 NOTE — Progress Notes (Signed)
 This patient returns to my office for at risk foot care.  This patient requires this care by a professional since this patient will be at risk due to having diabetes.  patient is unable to cut nails himself since the patient cannot reach his nails.These nails are painful walking and wearing shoes.  Patient presents to the office with a caregiver. This patient presents for at risk foot care today.  General Appearance  Alert, conversant and in no acute stress.  Vascular  Dorsalis pedis and posterior tibial  pulses are palpable  bilaterally.  Capillary return is within normal limits  bilaterally. Temperature is within normal limits  bilaterally.  Neurologic  Senn-Weinstein monofilament wire test within normal limits  bilaterally. Muscle power within normal limits bilaterally.  Nails Thick disfigured discolored nails with subungual debris  from hallux to fifth toes bilaterally. No evidence of bacterial infection or drainage bilaterally. Dry blood noted under second toenail right foot.  Orthopedic  No limitations of motion  feet .  No crepitus or effusions noted.  No bony pathology or digital deformities noted.  Skin  normotropic skin with no porokeratosis noted bilaterally.  No signs of infections or ulcers noted.     Onychomycosis  Pain in right toes  Pain in left toes  Contusion/subungual hematoma right second toe.   Consent was obtained for treatment procedures.   Mechanical debridement of nails 1-5  bilaterally performed with a nail nipper.  Filed with dremel without incident.    Return office visit    4 months                 Told patient to return for periodic foot care and evaluation due to potential at risk complications.   Ruffin Cotton DPM

## 2024-01-03 ENCOUNTER — Emergency Department (HOSPITAL_BASED_OUTPATIENT_CLINIC_OR_DEPARTMENT_OTHER)

## 2024-01-03 ENCOUNTER — Emergency Department (HOSPITAL_BASED_OUTPATIENT_CLINIC_OR_DEPARTMENT_OTHER): Admitting: Radiology

## 2024-01-03 ENCOUNTER — Other Ambulatory Visit: Payer: Self-pay

## 2024-01-03 ENCOUNTER — Emergency Department (HOSPITAL_BASED_OUTPATIENT_CLINIC_OR_DEPARTMENT_OTHER)
Admission: EM | Admit: 2024-01-03 | Discharge: 2024-01-03 | Disposition: A | Discharge status: Home / Self Care | Attending: Emergency Medicine | Admitting: Emergency Medicine

## 2024-01-03 ENCOUNTER — Encounter (HOSPITAL_BASED_OUTPATIENT_CLINIC_OR_DEPARTMENT_OTHER): Payer: Self-pay | Admitting: Emergency Medicine

## 2024-01-03 DIAGNOSIS — R5383 Other fatigue: Secondary | ICD-10-CM | POA: Diagnosis present

## 2024-01-03 DIAGNOSIS — K5900 Constipation, unspecified: Secondary | ICD-10-CM | POA: Insufficient documentation

## 2024-01-03 DIAGNOSIS — I3139 Other pericardial effusion (noninflammatory): Secondary | ICD-10-CM | POA: Diagnosis not present

## 2024-01-03 DIAGNOSIS — Z7982 Long term (current) use of aspirin: Secondary | ICD-10-CM | POA: Insufficient documentation

## 2024-01-03 DIAGNOSIS — R748 Abnormal levels of other serum enzymes: Secondary | ICD-10-CM | POA: Diagnosis not present

## 2024-01-03 DIAGNOSIS — E876 Hypokalemia: Secondary | ICD-10-CM | POA: Diagnosis not present

## 2024-01-03 DIAGNOSIS — N179 Acute kidney failure, unspecified: Secondary | ICD-10-CM | POA: Insufficient documentation

## 2024-01-03 DIAGNOSIS — R519 Headache, unspecified: Secondary | ICD-10-CM | POA: Insufficient documentation

## 2024-01-03 DIAGNOSIS — R7989 Other specified abnormal findings of blood chemistry: Secondary | ICD-10-CM

## 2024-01-03 LAB — COMPREHENSIVE METABOLIC PANEL WITH GFR
ALT: 66 U/L — ABNORMAL HIGH (ref 0–44)
ALT: 69 U/L — ABNORMAL HIGH (ref 0–44)
AST: 83 U/L — ABNORMAL HIGH (ref 15–41)
AST: 90 U/L — ABNORMAL HIGH (ref 15–41)
Albumin: 3.3 g/dL — ABNORMAL LOW (ref 3.5–5.0)
Albumin: 3.3 g/dL — ABNORMAL LOW (ref 3.5–5.0)
Alkaline Phosphatase: 85 U/L (ref 38–126)
Alkaline Phosphatase: 86 U/L (ref 38–126)
Anion gap: 14 (ref 5–15)
Anion gap: 12 (ref 5–15)
BUN: 30 mg/dL — ABNORMAL HIGH (ref 8–23)
BUN: 28 mg/dL — ABNORMAL HIGH (ref 8–23)
CO2: 26 mmol/L (ref 22–32)
CO2: 28 mmol/L (ref 22–32)
Calcium: 9.7 mg/dL (ref 8.9–10.3)
Calcium: 9.3 mg/dL (ref 8.9–10.3)
Chloride: 102 mmol/L (ref 98–111)
Chloride: 100 mmol/L (ref 98–111)
Creatinine, Ser: 1.39 mg/dL — ABNORMAL HIGH (ref 0.61–1.24)
Creatinine, Ser: 1.2 mg/dL (ref 0.61–1.24)
GFR, Estimated: 54 mL/min — ABNORMAL LOW (ref >60)
GFR, Estimated: >60 mL/min (ref >60)
Glucose, Bld: 109 mg/dL — ABNORMAL HIGH (ref 70–99)
Glucose, Bld: 193 mg/dL — ABNORMAL HIGH (ref 70–99)
Potassium: 3.4 mmol/L — ABNORMAL LOW (ref 3.5–5.1)
Potassium: 3.2 mmol/L — ABNORMAL LOW (ref 3.5–5.1)
Sodium: 141 mmol/L (ref 135–145)
Sodium: 139 mmol/L (ref 135–145)
Total Bilirubin: 0.4 mg/dL (ref 0.0–1.2)
Total Bilirubin: 0.4 mg/dL (ref 0.0–1.2)
Total Protein: 6.2 g/dL — ABNORMAL LOW (ref 6.5–8.1)
Total Protein: 6.1 g/dL — ABNORMAL LOW (ref 6.5–8.1)

## 2024-01-03 LAB — CBC WITH DIFFERENTIAL/PLATELET
Abs Immature Granulocytes: 0.03 10*3/uL (ref 0.00–0.07)
Basophils Absolute: 0 10*3/uL (ref 0.0–0.1)
Basophils Relative: 0 %
Eosinophils Absolute: 0 10*3/uL (ref 0.0–0.5)
Eosinophils Relative: 0 %
HCT: 30.3 % — ABNORMAL LOW (ref 39.0–52.0)
Hemoglobin: 9.7 g/dL — ABNORMAL LOW (ref 13.0–17.0)
Immature Granulocytes: 1 %
Lymphocytes Relative: 16 %
Lymphs Abs: 0.5 10*3/uL — ABNORMAL LOW (ref 0.7–4.0)
MCH: 29.2 pg (ref 26.0–34.0)
MCHC: 32 g/dL (ref 30.0–36.0)
MCV: 91.3 fL (ref 80.0–100.0)
Monocytes Absolute: 0.4 10*3/uL (ref 0.1–1.0)
Monocytes Relative: 14 %
Neutro Abs: 2.1 10*3/uL (ref 1.7–7.7)
Neutrophils Relative %: 69 %
Platelets: 108 10*3/uL — ABNORMAL LOW (ref 150–400)
RBC: 3.32 MIL/uL — ABNORMAL LOW (ref 4.22–5.81)
RDW: 14.6 % (ref 11.5–15.5)
WBC: 3.1 10*3/uL — ABNORMAL LOW (ref 4.0–10.5)
nRBC: 0 % (ref 0.0–0.2)

## 2024-01-03 LAB — I-STAT VENOUS BLOOD GAS, ED
Acid-Base Excess: 3 mmol/L — ABNORMAL HIGH (ref 0.0–2.0)
Bicarbonate: 28.5 mmol/L — ABNORMAL HIGH (ref 20.0–28.0)
Calcium, Ion: 1.28 mmol/L (ref 1.15–1.40)
HCT: 28 % — ABNORMAL LOW (ref 39.0–52.0)
Hemoglobin: 9.5 g/dL — ABNORMAL LOW (ref 13.0–17.0)
O2 Saturation: 32 %
Potassium: 3.4 mmol/L — ABNORMAL LOW (ref 3.5–5.1)
Sodium: 140 mmol/L (ref 135–145)
TCO2: 30 mmol/L (ref 22–32)
pCO2, Ven: 49.8 mmHg (ref 44–60)
pH, Ven: 7.366 (ref 7.25–7.43)
pO2, Ven: 21 mmHg — CL (ref 32–45)

## 2024-01-03 LAB — MAGNESIUM: Magnesium: 1.9 mg/dL (ref 1.7–2.4)

## 2024-01-03 LAB — URINALYSIS, ROUTINE W REFLEX MICROSCOPIC
Bacteria, UA: NONE SEEN
Bilirubin Urine: NEGATIVE
Glucose, UA: NEGATIVE mg/dL
Ketones, ur: NEGATIVE mg/dL
Leukocytes,Ua: NEGATIVE
Nitrite: NEGATIVE
Protein, ur: 100 mg/dL — AB
Specific Gravity, Urine: 1.018 (ref 1.005–1.030)
pH: 6 (ref 5.0–8.0)

## 2024-01-03 LAB — RESP PANEL BY RT-PCR (RSV, FLU A&B, COVID)  RVPGX2
Influenza A by PCR: NEGATIVE
Influenza B by PCR: NEGATIVE
Resp Syncytial Virus by PCR: NEGATIVE
SARS Coronavirus 2 by RT PCR: NEGATIVE

## 2024-01-03 LAB — TROPONIN T, HIGH SENSITIVITY
Troponin T High Sensitivity: 21 ng/L — ABNORMAL HIGH (ref <19)
Troponin T High Sensitivity: 20 ng/L — ABNORMAL HIGH (ref <19)

## 2024-01-03 LAB — LIPASE, BLOOD: Lipase: 24 U/L (ref 11–51)

## 2024-01-03 MED ORDER — POTASSIUM CHLORIDE 20 MEQ PO PACK
40.0000 meq | PACK | ORAL | Status: AC
  Administered 2024-01-03: 40 meq via ORAL
  Filled 2024-01-03: qty 2

## 2024-01-03 MED ORDER — IOHEXOL 300 MG/ML  SOLN
100.0000 mL | Freq: Once | INTRAMUSCULAR | Status: AC | PRN
  Administered 2024-01-03: 85 mL via INTRAVENOUS

## 2024-01-03 MED ORDER — SODIUM CHLORIDE 0.9 % IV SOLN: Freq: Once | INTRAVENOUS | Status: AC

## 2024-01-03 MED ORDER — LACTATED RINGERS IV BOLUS
1000.0000 mL | Freq: Once | INTRAVENOUS | Status: AC
  Administered 2024-01-03: 1000 mL via INTRAVENOUS

## 2024-01-03 NOTE — ED Triage Notes (Signed)
 Family states patient has been lethargic since Monday, Increased drooling. Has developmental delay and drooling is normal, but not to this degree per family.   Hx of UTI and see at urologist today to r/o infection.

## 2024-01-03 NOTE — ED Provider Notes (Addendum)
 [place] EMERGENCY DEPARTMENT AT [facility] Provider Note   CSN: [id number] Arrival date & time: 01/03/24  1122     History Chief Complaint  Patient presents with   Fatigue    Fred [name] is a 73 y.o. male with history of gin, diet Beatties, intellectual delay, nonverbal presents to the emergency department today for evaluation of increased fatigue since Monday.  He presents with caretaker who provides the history.  Caretaker reports that him and other people are responsible for his care.  Reports that on Monday he was acting more fatigued and on Tuesday was told he was not participating in activity at his day group.  He reports that he had very similar symptoms like this when he had a UTI.  He made an appointment for the urologist this morning however so the urine did not show any signs of infection.  He still been eating and drinking normally.  No fevers.  Denies any cough.  Patient has told caretaker that his stomach hurts when asked however caretaker reports that he usually says this.  He also reports he has been having larger bowel movements and then giving him MiraLAX  to help with this.  He does use the bathroom independently most times.  The last known bowel movement was on Saturday.  Caretaker reports that he usually shuffles carts in his hands as an activity however has even been doing this more slowly.  Has been drooling a little more than his typical today.  HPI     Home Medications Prior to Admission medications   Medication Sig Start Date End Date Taking? Authorizing Provider  alfuzosin  (UROXATRAL ) 10 MG 24 hr tablet Take 10 mg by mouth daily after breakfast.  03/30/20   [provider]  ALLERGY RELIEF 10 MG tablet Take 10 mg by mouth daily.    [provider]  amLODipine  (NORVASC ) 10 MG tablet Take 10 mg by mouth daily.  05/22/19   [provider]  aspirin  EC 81 MG tablet Take 81 mg by mouth daily.    [provider]  atorvastatin   (LIPITOR) 20 MG tablet Take 20 mg by mouth daily.  02/28/18   [provider]  benztropine  (COGENTIN ) 0.5 MG tablet Take 0.5 mg by mouth daily.     [provider]  carvedilol  (COREG ) 6.25 MG tablet Take 1 tablet (6.25 mg total) by mouth 2 (two) times daily with a meal. 01/03/23   Leona Rake, MD  ferrous sulfate  325 (65 FE) MG tablet Take 325 mg by mouth every Monday, Wednesday, and Friday.  03/27/18   [provider]  ketoconazole (NIZORAL) 2 % shampoo Apply 1 Application topically 2 (two) times a week.    [provider]  lisinopril-hydrochlorothiazide (ZESTORETIC) 20-25 MG tablet Take by mouth.    [provider]  metFORMIN (GLUCOPHAGE) 500 MG tablet Take 500 mg by mouth 2 (two) times daily.  01/20/15   [provider]  potassium chloride  (KLOR-CON ) 10 MEQ tablet Take 10 mEq by mouth daily.    [provider]  potassium chloride  SA (KLOR-CON  M) 20 MEQ tablet Take 2 tablets (40 mEq total) by mouth daily for 5 days. 01/04/23 01/09/23  Leona Rake, MD  risperiDONE  (RISPERDAL ) 0.5 MG tablet Take 0.5 mg by mouth daily.    [provider]  risperiDONE  (RISPERDAL ) 3 MG tablet Take 3 mg by mouth at bedtime. 05/16/19   [provider]  sertraline  (ZOLOFT ) 50 MG tablet Take 50 mg by mouth daily.  [provider]      Allergies    Patient has no known allergies.    Review of Systems   Review of Systems  Unable to perform ROS: Patient nonverbal    Physical Exam Updated Vital Signs BP (!) 140/73   Pulse 80   Temp 98 F (36.7 C)   Resp 17   SpO2 98%  Physical Exam Vitals and nursing note reviewed.  Constitutional:      Appearance: He is not toxic-appearing.     Comments: Somnolent, but does awaken some to voice. Does need stimulation to stay awake, but is pulling the covers over his head.   HENT:     Head: Normocephalic and atraumatic.     Mouth/Throat:     Mouth: Mucous membranes are dry.      Comments: Uvula midline, airway patent, controlling secretions, dry membranes. No pharyngeal erythema, edema, or exudate noted.  Eyes:     General: No scleral icterus.    Pupils: Pupils are equal, round, and reactive to light.     Comments: Patient does track, difficult to get EOMI as patient does not follow directions well at baseline  Cardiovascular:     Rate and Rhythm: Normal rate.     Pulses:          Radial pulses are 2+ on the right side and 2+ on the left side.       Dorsalis pedis pulses are 2+ on the right side and 2+ on the left side.       Posterior tibial pulses are 2+ on the right side and 2+ on the left side.  Pulmonary:     Effort: Pulmonary effort is normal. No respiratory distress.  Abdominal:     Palpations: Abdomen is soft.     Comments: With palpation of the abdomen, he does not illicit a painful response. Abdomen is soft without guarding or rebound. No overlying skin changes noted.   Musculoskeletal:     Cervical back: Normal range of motion.     Right lower leg: No edema.     Left lower leg: No edema.  Skin:    General: Skin is warm and dry.     Comments: No wounds visible to the feet, legs, or arms.   Neurological:     Comments: Non verbal. Will move all extremities to command.      ED Results / Procedures / Treatments   Labs (all labs ordered are listed, but only abnormal results are displayed) Labs Reviewed  CBC WITH DIFFERENTIAL/PLATELET - Abnormal; Notable for the following components:      Result Value   WBC 3.1 (*)    RBC 3.32 (*)    Hemoglobin 9.7 (*)    HCT 30.3 (*)    Platelets 108 (*)    Lymphs Abs 0.5 (*)    All other components within normal limits  COMPREHENSIVE METABOLIC PANEL WITH GFR - Abnormal; Notable for the following components:   Potassium 3.4 (*)    Glucose, Bld 109 (*)    BUN 30 (*)    Creatinine, Ser 1.39 (*)    Total Protein 6.2 (*)    Albumin 3.3 (*)    AST 83 (*)    ALT 66 (*)    GFR, Estimated 54 (*)    All other  components within normal limits  URINALYSIS, ROUTINE W REFLEX MICROSCOPIC - Abnormal; Notable for the following components:   Hgb urine dipstick SMALL (*)    Protein,  ur 100 (*)    All other components within normal limits  COMPREHENSIVE METABOLIC PANEL WITH GFR - Abnormal; Notable for the following components:   Potassium 3.2 (*)    Glucose, Bld 193 (*)    BUN 28 (*)    Total Protein 6.1 (*)    Albumin 3.3 (*)    AST 90 (*)    ALT 69 (*)    All other components within normal limits  I-STAT VENOUS BLOOD GAS, ED - Abnormal; Notable for the following components:   pO2, Ven 21 (*)    Bicarbonate 28.5 (*)    Acid-Base Excess 3.0 (*)    Potassium 3.4 (*)    HCT 28.0 (*)    Hemoglobin 9.5 (*)    All other components within normal limits  TROPONIN T, HIGH SENSITIVITY - Abnormal; Notable for the following components:   Troponin T High Sensitivity 21 (*)    All other components within normal limits  TROPONIN T, HIGH SENSITIVITY - Abnormal; Notable for the following components:   Troponin T High Sensitivity 20 (*)    All other components within normal limits  RESP PANEL BY RT-PCR (RSV, FLU A&B, COVID)  RVPGX2  MAGNESIUM   LIPASE, BLOOD    EKG EKG Interpretation Date/Time:  Wednesday Jan 03 2024 17:46:13 EDT Ventricular Rate:  80 PR Interval:  177 QRS Duration:  109 QT Interval:  424 QTC Calculation: 490 R Axis:   -29  Text Interpretation: Sinus rhythm suspected artifact Low voltage, extremity and precordial leads Artifact Confirmed by Scarlette Currier ([id number]) on 01/03/2024 6:03:02 PM  Radiology   CT ABDOMEN PELVIS W CONTRAST Result Date: 01/03/2024 CLINICAL DATA:  Abdominal pain. EXAM: CT ABDOMEN AND PELVIS WITH CONTRAST TECHNIQUE: Multidetector CT imaging of the abdomen and pelvis was performed using the standard protocol following bolus administration of intravenous contrast. RADIATION DOSE REDUCTION: This exam was performed according to the departmental dose-optimization program  which includes automated exposure control, adjustment of the mA and/or kV according to patient size and/or use of iterative reconstruction technique. CONTRAST:  85mL OMNIPAQUE  IOHEXOL  300 MG/ML  SOLN COMPARISON:  CT abdomen pelvis dated 02/27/2012. FINDINGS: Evaluation of this exam is limited due to respiratory motion. Lower chest: Mild cardiomegaly. Moderate pericardial effusion measuring 17 mm in thickness. There is a small left pleural effusion and left lung base atelectasis. There is coronary vascular calcification. No intra-abdominal free air or free fluid. Hepatobiliary: The liver is unremarkable. There is mild biliary dilatation. No calcified gallstone. Pancreas: Unremarkable. No pancreatic ductal dilatation or surrounding inflammatory changes. Spleen: Normal in size without focal abnormality. Adrenals/Urinary Tract: The adrenal glands unremarkable. Small bilateral renal cysts. There is no hydronephrosis on either side. There is symmetric enhancement and excretion of contrast by both kidneys. The urinary bladder is grossly unremarkable Stomach/Bowel: There is large amount of stool throughout the colon. There is no bowel obstruction or active inflammation. The appendix is normal. Vascular/Lymphatic: Moderate aortoiliac atherosclerotic disease. The IVC is unremarkable. Progression of chronic noncalcified plaque or mural thrombus in the right common iliac vein. No portal venous gas. There is no adenopathy. Reproductive: The prostate and seminal vesicles are grossly unremarkable. Other: None Musculoskeletal: Degenerative changes of the spine. No acute osseous pathology. IMPRESSION: 1. No acute intra-abdominal or pelvic pathology. 2. Constipation. No bowel obstruction. Normal appendix. 3. Mild cardiomegaly with a moderate pericardial effusion. 4. Small left pleural effusion and left lung base atelectasis. 5.  Aortic Atherosclerosis (ICD10-I70.0). Electronically Signed   By: Marene Shape.D.  On: 01/03/2024  16:49   DG Chest Port 1 View Result Date: 01/03/2024 CLINICAL DATA:  Fatigue. EXAM: PORTABLE CHEST 1 VIEW COMPARISON:  Chest radiograph dated 11/06/2022. FINDINGS: No focal consolidation, effusion or pneumothorax. Stable cardiomegaly. Atherosclerotic calcification of the aorta. No acute osseous pathology. IMPRESSION: 1. No active disease. 2. Cardiomegaly. Electronically Signed   By: Angus Bark M.D.   On: 01/03/2024 14:39   CT Head Wo Contrast Result Date: 01/03/2024 CLINICAL DATA:  Mental status change of unknown cause.  Lethargic. EXAM: CT HEAD WITHOUT CONTRAST TECHNIQUE: Contiguous axial images were obtained from the base of the skull through the vertex without intravenous contrast. RADIATION DOSE REDUCTION: This exam was performed according to the departmental dose-optimization program which includes automated exposure control, adjustment of the mA and/or kV according to patient size and/or use of iterative reconstruction technique. COMPARISON:  01/01/2023 FINDINGS: Brain: No acute CT finding. Mild age related volume loss. Mild chronic small-vessel ischemic change of the deep white matter. No cortical or large vessel territory stroke. No mass, hemorrhage, hydrocephalus or extra-axial collection. Vascular: There is atherosclerotic calcification of the major vessels at the base of the brain. Skull: Negative Sinuses/Orbits: Clear/normal Other: None IMPRESSION: No acute CT finding. Mild age related volume loss and chronic small-vessel ischemic change of the white matter. Electronically Signed   By: Bettylou Brunner M.D.   On: 01/03/2024 13:55    Procedures Procedures    Medications Ordered in ED Medications  lactated ringers  bolus 1,000 mL (0 mLs Intravenous Stopped 01/03/24 1631)  iohexol  (OMNIPAQUE ) 300 MG/ML solution 100 mL (85 mLs Intravenous Contrast Given 01/03/24 1539)  0.9 %  sodium chloride  infusion (0 mLs Intravenous Stopped 01/03/24 2211)  potassium chloride  (KLOR-CON ) packet 40 mEq (40 mEq  Oral Given 01/03/24 2152)    ED Course/ Medical Decision Making/ A&P Clinical Course as of 01/04/24 1159  Wed Jan 03, 2024  1236 BP 106/66 in the room  [RR]  1851 On phone with cardiology.  [RR]    Clinical Course User Index [RR] Spence Dux, PA-C   Medical Decision Making Amount and/or Complexity of Data Reviewed Labs: ordered. Radiology: ordered. ECG/medicine tests: ordered.  Risk Prescription drug management.   72 y.o. male presents to the ER for evaluation of increased fatigue, behavioral change. Differential diagnosis includes but is not limited to infection, stroke, electrolyte abnormality, UTI. Vital signs BP 106/66, unremarkable. Physical exam as noted above.   Given that the patient can not relay a history, will broaden work up with CT imaging and labs.   EKG reviewed and interpreted by my attending and read as Sinus rhythm suspected artifact Low voltage, extremity and precordial leads Artifact.   I independently reviewed and interpreted the patient's labs. VBG shows pH 7.366. CBC shows slight leukopenia, however not far off from baseline. Anemia present and at baseline. His chronic thrombocytopenia also present.  Magnesium  within normal limits.  Negative for COVID, flu, RSV.  Urinalysis is small amount of hemoglobin had a protein otherwise no signs of infection.  Troponin at 21 with repeat at 20. CMP shows potassium 3.4, Leukos of 109, BUN of 30.  Creatinine 1.39 which appears his baseline is between 0.88-1.20.  He is decrease in his total protein albumin.  He does have newly elevated AST and ALT however slightly at 83 and 66% respectively.  Normal bili and alk phos.  Given the AKI, fluids were initiated. Potassium replenished as well. Given his non verbal status, but pointing to his epigastirc region for pain,  however is non tender, will CT abdomen given elevated LFTs.  CT Head shows No acute CT finding. Mild age related volume loss and chronic small-vessel ischemic change  of the white matter. Per radiologist's interpretation.    CXR  1. No active disease. 2. Cardiomegaly. Per radiologist's interpretation.    CT abdomen 1. No acute intra-abdominal or pelvic pathology. 2. Constipation. No bowel obstruction. Normal appendix. 3. Mild cardiomegaly with a moderate pericardial effusion. 4. Small left pleural effusion and left lung base atelectasis. 5.  Aortic Atherosclerosis. Per radiologist's interpretation.    On re-evaluation, the patient is sitting up on stretcher eating some pretzels. Alert. Does not appear to be in any acute distress. Patient's sister at beside and reports that he is acting at his baseline for her. He is more amenable to a physical examination. Will call hospitalist for possible admission given AKI and CT findings.   I consulted IM for possible admission and spoke with Dr. Edith Gores. He does not think admission is warranted with his Cr and imaging findings. Recommends consulting cardiology and giving more fluids and discharge. Additional fluids ordered.   I consulted cardiology and spoke with Dr. Londa Rival. Given that the patient is hemodynamically stable, does not see need for admission, but reports he should have close follow up on this. He is not concerned with the troponins or EKG for ACS.   I called the patient's PCP, Dr. Daivd Dub, at [facility] and left a message for his in basket on the patient's effusion and need for outpatient ECHO.   The patient is still more alert and awake. Has eaten multiple snacks and meals without issue. He is watching TV in no acute distress. Sister reports that he is at his baseline for her.  We discussed the patient's labs and imaging.  Recommended increasing his MiraLAX  to help with his stool burden.  We discussed to return tomorrow for right upper quadrant ultrasound given him pointing to his epigastric region and having slightly elevated LFT.  Discussed that I also called his PCP to arrange an echo outpatient  given the pericardial effusion and for lab redraws.  All questions at this time answered from sister.  He appears to be at baseline for sister. Alert, non toxic. The patient may have had some dehydration causing his fatigue. No focal deficit, I doubt stroke. He is stable for discharge with close outpatient follow up.  We discussed the results of the labs/imaging. The plan is return for RUQ US  tomorrow, PO hydration, close follow up with PCP. Recommended increasing his Miralax  to two scoops in the morning. Discussed that they can use an occasional Dulcolax, however to not use them consistently. We discussed strict return precautions and red flag symptoms. The patient's sister verbalized their understanding and agrees to the plan. The patient is stable and being discharged home in good condition.  I discussed this case with my attending physician who cosigned this note including patient's presenting symptoms, physical exam, and planned diagnostics and interventions. Attending physician stated agreement with plan or made changes to plan which were implemented.   Portions of this report may have been transcribed using voice recognition software. Every effort was made to ensure accuracy; however, inadvertent computerized transcription errors may be present.    Final Clinical Impression(s) / ED Diagnoses Final diagnoses:  AKI (acute kidney injury) (HCC)  Elevated troponin  Other fatigue  Elevated liver enzymes  Constipation, unspecified constipation type  Pericardial effusion  Hypokalemia    Rx / DC  Orders ED Discharge Orders          Ordered    US  Abdomen Limited RUQ/Gall Bladder        01/03/24 2101              Spence Dux, PA-C 01/04/24 1216    Spence Dux, [place] 01/04/24 1217    Trish Furl, MD 01/08/24 [phone]

## 2024-01-03 NOTE — ED Notes (Signed)
 Pt attempted to urinate, but was unsuccessful in providing sample. EDP notified pt unable to urinate at this time.

## 2024-01-03 NOTE — Discharge Instructions (Addendum)
 You were seen in the ER today for evaluation of your fatigue. It appears your kidney function was elevated, likely from some dehydration. Please make sure that you are drinking plenty of fluids, mainly water. Additionally, if you are not having good bowel movements with one scoop of Miralax , you can do two scoops daily. You can take Dulcolax to help now, but do not use this frequently. Increasing water intake will also help with the constipation as well. His CT scan showed that he had a pericardial effusion. I have called his PCP and left a message as he will need to get an ultrasound (ECHO) of this. For his liver enzymes, I would like for you to return tomorrow to have an ultrasound done of your gallbladder. Please make sure to follow up with your PCP for re-check of your labs. If you have any concerns, new or worsening symptoms, please return to the ER for evaluation.   Contact a health care provider if: Your symptoms get worse. You have new symptoms, such as: Headaches. Skin that is darker or lighter than normal. Easy bruising. Feeling itchy. Hiccups. Lack of menstrual periods. You have a fever. Get help right away if: You have signs of severe kidney disease, such as: Chest pain. Shortness of breath. Seizures. You have pain or bleeding when you pass urine. You make little or no urine. These symptoms may be an emergency. Get help right away. Call 911. Do not wait to see if the symptoms will go away. Do not drive yourself to the hospital.

## 2024-01-04 ENCOUNTER — Ambulatory Visit (HOSPITAL_BASED_OUTPATIENT_CLINIC_OR_DEPARTMENT_OTHER)
Admission: RE | Admit: 2024-01-04 | Discharge: 2024-01-04 | Disposition: A | Discharge status: Home / Self Care | Source: Ambulatory Visit | Attending: Family Medicine | Admitting: Family Medicine

## 2024-01-04 DIAGNOSIS — R748 Abnormal levels of other serum enzymes: Secondary | ICD-10-CM | POA: Insufficient documentation

## 2024-01-04 NOTE — ED Provider Notes (Signed)
 Patient comes back for a right upper quadrant ultrasound, presumably due to LFTs.  Results are not back on that yet.  Ultrasound tech advises that the patient caregiver wants to leave.  They will advise the caregiver to follow-up with the PCP regarding these results.   Hershel Los, MD 01/04/24 1055

## 2024-01-23 DIAGNOSIS — I3139 Other pericardial effusion (noninflammatory): Secondary | ICD-10-CM | POA: Diagnosis present

## 2024-01-23 DIAGNOSIS — I499 Cardiac arrhythmia, unspecified: Secondary | ICD-10-CM | POA: Insufficient documentation

## 2024-01-29 NOTE — Telephone Encounter (Signed)
 LVM for patient care giver to return call for test results.

## 2024-02-05 NOTE — Progress Notes (Signed)
 Patient received a 3day Monitor in office Per Youlanda Juba, MD  Education was given to patient. Patient verbalized understanding.  Patient understands that they are to mail in their phone via [employer] in prepaid and pre-addressed envelope provided.  Patient understands to contact [employer] for any technical issues or to request supplies  Expected return date: 02/08/24  Diagnosis: E11.59, I31.39, E11.69, I49.9, D64.9, E87.6  Monitor Number: [id number]  Phone Inventory Number: [id number]  Reading MD: Youlanda Juba, MD

## 2024-03-07 ENCOUNTER — Encounter (HOSPITAL_COMMUNITY): Payer: Self-pay | Admitting: Emergency Medicine

## 2024-03-07 ENCOUNTER — Inpatient Hospital Stay (HOSPITAL_COMMUNITY)
Admission: EM | Admit: 2024-03-07 | Discharge: 2024-03-13 | DRG: 314 | Disposition: A | Discharge status: Home / Self Care | Source: Ambulatory Visit | Attending: Internal Medicine | Admitting: Internal Medicine

## 2024-03-07 ENCOUNTER — Emergency Department (HOSPITAL_COMMUNITY)

## 2024-03-07 ENCOUNTER — Other Ambulatory Visit: Payer: Self-pay

## 2024-03-07 DIAGNOSIS — N138 Other obstructive and reflux uropathy: Secondary | ICD-10-CM | POA: Diagnosis present

## 2024-03-07 DIAGNOSIS — R600 Localized edema: Secondary | ICD-10-CM

## 2024-03-07 DIAGNOSIS — I152 Hypertension secondary to endocrine disorders: Secondary | ICD-10-CM | POA: Diagnosis present

## 2024-03-07 DIAGNOSIS — I3139 Other pericardial effusion (noninflammatory): Principal | ICD-10-CM | POA: Diagnosis present

## 2024-03-07 DIAGNOSIS — K5900 Constipation, unspecified: Secondary | ICD-10-CM | POA: Diagnosis present

## 2024-03-07 DIAGNOSIS — I959 Hypotension, unspecified: Secondary | ICD-10-CM | POA: Diagnosis present

## 2024-03-07 DIAGNOSIS — I319 Disease of pericardium, unspecified: Secondary | ICD-10-CM | POA: Diagnosis not present

## 2024-03-07 DIAGNOSIS — E119 Type 2 diabetes mellitus without complications: Secondary | ICD-10-CM

## 2024-03-07 DIAGNOSIS — R000 Tachycardia, unspecified: Secondary | ICD-10-CM | POA: Diagnosis not present

## 2024-03-07 DIAGNOSIS — E785 Hyperlipidemia, unspecified: Secondary | ICD-10-CM | POA: Diagnosis present

## 2024-03-07 DIAGNOSIS — I5023 Acute on chronic systolic (congestive) heart failure: Secondary | ICD-10-CM | POA: Diagnosis present

## 2024-03-07 DIAGNOSIS — Z66 Do not resuscitate: Secondary | ICD-10-CM | POA: Diagnosis present

## 2024-03-07 DIAGNOSIS — N183 Chronic kidney disease, stage 3 unspecified: Secondary | ICD-10-CM | POA: Diagnosis present

## 2024-03-07 DIAGNOSIS — E1159 Type 2 diabetes mellitus with other circulatory complications: Secondary | ICD-10-CM | POA: Diagnosis present

## 2024-03-07 DIAGNOSIS — Z87891 Personal history of nicotine dependence: Secondary | ICD-10-CM

## 2024-03-07 DIAGNOSIS — N179 Acute kidney failure, unspecified: Secondary | ICD-10-CM | POA: Diagnosis present

## 2024-03-07 DIAGNOSIS — I491 Atrial premature depolarization: Secondary | ICD-10-CM | POA: Diagnosis not present

## 2024-03-07 DIAGNOSIS — F09 Unspecified mental disorder due to known physiological condition: Secondary | ICD-10-CM | POA: Diagnosis present

## 2024-03-07 DIAGNOSIS — M7989 Other specified soft tissue disorders: Secondary | ICD-10-CM | POA: Diagnosis present

## 2024-03-07 DIAGNOSIS — Z7984 Long term (current) use of oral hypoglycemic drugs: Secondary | ICD-10-CM

## 2024-03-07 DIAGNOSIS — D649 Anemia, unspecified: Secondary | ICD-10-CM | POA: Diagnosis present

## 2024-03-07 DIAGNOSIS — N401 Enlarged prostate with lower urinary tract symptoms: Secondary | ICD-10-CM | POA: Diagnosis present

## 2024-03-07 DIAGNOSIS — D631 Anemia in chronic kidney disease: Secondary | ICD-10-CM | POA: Diagnosis present

## 2024-03-07 DIAGNOSIS — Z79899 Other long term (current) drug therapy: Secondary | ICD-10-CM

## 2024-03-07 DIAGNOSIS — D72829 Elevated white blood cell count, unspecified: Secondary | ICD-10-CM | POA: Diagnosis present

## 2024-03-07 DIAGNOSIS — I5020 Unspecified systolic (congestive) heart failure: Secondary | ICD-10-CM | POA: Diagnosis not present

## 2024-03-07 DIAGNOSIS — I314 Cardiac tamponade: Principal | ICD-10-CM

## 2024-03-07 DIAGNOSIS — F79 Unspecified intellectual disabilities: Secondary | ICD-10-CM | POA: Diagnosis present

## 2024-03-07 DIAGNOSIS — E1122 Type 2 diabetes mellitus with diabetic chronic kidney disease: Secondary | ICD-10-CM | POA: Diagnosis present

## 2024-03-07 DIAGNOSIS — Z7982 Long term (current) use of aspirin: Secondary | ICD-10-CM

## 2024-03-07 LAB — BASIC METABOLIC PANEL WITH GFR
Anion gap: 13 (ref 5–15)
BUN: 36 mg/dL — ABNORMAL HIGH (ref 8–23)
CO2: 19 mmol/L — ABNORMAL LOW (ref 22–32)
Calcium: 9 mg/dL (ref 8.9–10.3)
Chloride: 108 mmol/L (ref 98–111)
Creatinine, Ser: 1.87 mg/dL — ABNORMAL HIGH (ref 0.61–1.24)
GFR, Estimated: 37 mL/min — ABNORMAL LOW (ref >60)
Glucose, Bld: 139 mg/dL — ABNORMAL HIGH (ref 70–99)
Potassium: 4.4 mmol/L (ref 3.5–5.1)
Sodium: 140 mmol/L (ref 135–145)

## 2024-03-07 LAB — ECHOCARDIOGRAM COMPLETE
AR max vel: 3.76 cm2
AV Area VTI: 3.65 cm2
AV Area mean vel: 3.73 cm2
AV Mean grad: 4 mmHg
AV Peak grad: 7.4 mmHg
Ao pk vel: 1.36 m/s
Height: 68 in
S' Lateral: 3.1 cm
Weight: 2059.98 [oz_av]

## 2024-03-07 LAB — CBC
HCT: 32.7 % — ABNORMAL LOW (ref 39.0–52.0)
Hemoglobin: 10.2 g/dL — ABNORMAL LOW (ref 13.0–17.0)
MCH: 29.6 pg (ref 26.0–34.0)
MCHC: 31.2 g/dL (ref 30.0–36.0)
MCV: 94.8 fL (ref 80.0–100.0)
Platelets: 199 K/uL (ref 150–400)
RBC: 3.45 MIL/uL — ABNORMAL LOW (ref 4.22–5.81)
RDW: 15 % (ref 11.5–15.5)
WBC: 6.6 K/uL (ref 4.0–10.5)
nRBC: 0 % (ref 0.0–0.2)

## 2024-03-07 LAB — HEPATIC FUNCTION PANEL
ALT: 20 U/L (ref 0–44)
AST: 23 U/L (ref 15–41)
Albumin: 3.4 g/dL — ABNORMAL LOW (ref 3.5–5.0)
Alkaline Phosphatase: 56 U/L (ref 38–126)
Bilirubin, Direct: <0.1 mg/dL (ref 0.0–0.2)
Total Bilirubin: 0.5 mg/dL (ref 0.0–1.2)
Total Protein: 6.8 g/dL (ref 6.5–8.1)

## 2024-03-07 LAB — BRAIN NATRIURETIC PEPTIDE: B Natriuretic Peptide: 67.7 pg/mL (ref 0.0–100.0)

## 2024-03-07 LAB — TSH: TSH: 1.045 u[IU]/mL (ref 0.350–4.500)

## 2024-03-07 LAB — PROTIME-INR
INR: 1.2 (ref 0.8–1.2)
Prothrombin Time: 15.6 s — ABNORMAL HIGH (ref 11.4–15.2)

## 2024-03-07 LAB — TROPONIN I (HIGH SENSITIVITY)
Troponin I (High Sensitivity): 5 ng/L (ref <18)
Troponin I (High Sensitivity): 5 ng/L (ref <18)

## 2024-03-07 MED ORDER — ACETAMINOPHEN 325 MG PO TABS: 650.0000 mg | ORAL_TABLET | Freq: Four times a day (QID) | ORAL | Status: DC | PRN

## 2024-03-07 MED ORDER — ATORVASTATIN CALCIUM 10 MG PO TABS
20.0000 mg | ORAL_TABLET | Freq: Every day | ORAL | Status: DC
  Administered 2024-03-08 – 2024-03-13 (×6): 20 mg via ORAL
  Filled 2024-03-07 (×6): qty 2

## 2024-03-07 MED ORDER — LACTATED RINGERS IV BOLUS: 500.0000 mL | Freq: Once | INTRAVENOUS | Status: DC

## 2024-03-07 MED ORDER — ACETAMINOPHEN 650 MG RE SUPP: 650.0000 mg | Freq: Four times a day (QID) | RECTAL | Status: DC | PRN

## 2024-03-07 MED ORDER — BENZTROPINE MESYLATE 0.5 MG PO TABS
0.5000 mg | ORAL_TABLET | Freq: Every day | ORAL | Status: DC
  Administered 2024-03-08 – 2024-03-13 (×6): 0.5 mg via ORAL
  Filled 2024-03-07 (×6): qty 1

## 2024-03-07 MED ORDER — ENOXAPARIN SODIUM 30 MG/0.3ML IJ SOSY
30.0000 mg | PREFILLED_SYRINGE | INTRAMUSCULAR | Status: DC
Start: 2024-03-07 — End: 2024-03-08
  Administered 2024-03-07 – 2024-03-08 (×2): 30 mg via SUBCUTANEOUS
  Filled 2024-03-07 (×2): qty 0.3

## 2024-03-07 MED ORDER — ASPIRIN 81 MG PO TBEC: 81.0000 mg | DELAYED_RELEASE_TABLET | Freq: Every day | ORAL | Status: DC

## 2024-03-07 MED ORDER — LORATADINE 10 MG PO TABS
10.0000 mg | ORAL_TABLET | Freq: Every day | ORAL | Status: DC
  Administered 2024-03-08 – 2024-03-13 (×6): 10 mg via ORAL
  Filled 2024-03-07 (×6): qty 1

## 2024-03-07 MED ORDER — SENNOSIDES-DOCUSATE SODIUM 8.6-50 MG PO TABS: 1.0000 | ORAL_TABLET | Freq: Every evening | ORAL | Status: DC | PRN

## 2024-03-07 MED ORDER — RISPERIDONE 3 MG PO TABS
3.0000 mg | ORAL_TABLET | Freq: Every day | ORAL | Status: DC
  Administered 2024-03-07 – 2024-03-12 (×6): 3 mg via ORAL
  Filled 2024-03-07 (×8): qty 1

## 2024-03-07 MED ORDER — RISPERIDONE 0.5 MG PO TABS
0.5000 mg | ORAL_TABLET | Freq: Every day | ORAL | Status: DC
  Administered 2024-03-08 – 2024-03-13 (×6): 0.5 mg via ORAL
  Filled 2024-03-07 (×6): qty 1

## 2024-03-07 MED ORDER — LACTATED RINGERS IV BOLUS
500.0000 mL | Freq: Once | INTRAVENOUS | Status: AC
  Administered 2024-03-07: 500 mL via INTRAVENOUS

## 2024-03-07 MED ORDER — SERTRALINE HCL 25 MG PO TABS
50.0000 mg | ORAL_TABLET | Freq: Every day | ORAL | Status: DC
  Administered 2024-03-08 – 2024-03-13 (×6): 50 mg via ORAL
  Filled 2024-03-07: qty 1
  Filled 2024-03-07: qty 2
  Filled 2024-03-07 (×4): qty 1

## 2024-03-07 MED ORDER — ALFUZOSIN HCL ER 10 MG PO TB24
10.0000 mg | ORAL_TABLET | Freq: Every day | ORAL | Status: DC
  Administered 2024-03-08 – 2024-03-13 (×6): 10 mg via ORAL
  Filled 2024-03-07 (×6): qty 1

## 2024-03-07 MED ORDER — CARVEDILOL 3.125 MG PO TABS: 6.2500 mg | ORAL_TABLET | Freq: Two times a day (BID) | ORAL | Status: DC

## 2024-03-07 NOTE — ED Provider Notes (Signed)
 [place] EMERGENCY DEPARTMENT AT [facility] Provider Note   CSN: [id number] Arrival date & time: 03/07/24  1447     History  Chief Complaint  Patient presents with   Leg Swelling    Fred [name] is a 73 y.o. male with PMH as listed below who presents with bilateral leg swelling that started yesterday. Pt denies SHOB, pain, or fevers. Patient with MR and is poor historian; Level 5 caveat.  Patient's guardian at bedside notes that yesterday when he was dressing the patient he noticed LEE which patient has never had before. Pt hasn't had any SOB or complained of any chest pain or cough. Only finding was new LEE. Took to cards clinic this AM where he was noted to have pericardial effusion and sent to ED.   Per chart review of care everywhere, patient had echocardiogram today that showed: There is a large circumferential pericardial effusion noted anterior and posterior to the heart. Anteriorly around RV - 0.7 cm, around right atrium 2.2 cm, posteriorly fluid is 1.6cm around LV, around left atrium 0.6cm. No RV collapse was noted but RA collapse was noted and significant respiratory flow variation noted across mitral and tricuspid valves suggestive of early tamponade.   Pt was sent by [facility] cardiology provider Dr. Youlanda Juba for admission.  Phone number for guardian is Beula [phone]   Past Medical History:  Diagnosis Date   Diabetes mellitus    Hypertension    Mental retardation        Home Medications Prior to Admission medications   Medication Sig Start Date End Date Taking? Authorizing Provider  alfuzosin  (UROXATRAL ) 10 MG 24 hr tablet Take 10 mg by mouth daily after breakfast.  03/30/20   [provider]  ALLERGY RELIEF 10 MG tablet Take 10 mg by mouth daily.    [provider]  amLODipine  (NORVASC ) 10 MG tablet Take 10 mg by mouth daily.  05/22/19   [provider]  aspirin  EC 81 MG tablet Take 81 mg by mouth daily.     [provider]  atorvastatin  (LIPITOR) 20 MG tablet Take 20 mg by mouth daily.  02/28/18   [provider]  benztropine  (COGENTIN ) 0.5 MG tablet Take 0.5 mg by mouth daily.     [provider]  carvedilol  (COREG ) 6.25 MG tablet Take 1 tablet (6.25 mg total) by mouth 2 (two) times daily with a meal. 01/03/23   Jillian Buttery, MD  ferrous sulfate  325 (65 FE) MG tablet Take 325 mg by mouth every Monday, Wednesday, and Friday.  03/27/18   [provider]  ketoconazole (NIZORAL) 2 % shampoo Apply 1 Application topically 2 (two) times a week.    [provider]  lisinopril-hydrochlorothiazide (ZESTORETIC) 20-25 MG tablet Take by mouth.    [provider]  metFORMIN (GLUCOPHAGE) 500 MG tablet Take 500 mg by mouth 2 (two) times daily.  01/20/15   [provider]  potassium chloride  (KLOR-CON ) 10 MEQ tablet Take 10 mEq by mouth daily.    [provider]  potassium chloride  SA (KLOR-CON  M) 20 MEQ tablet Take 2 tablets (40 mEq total) by mouth daily for 5 days. 01/04/23 01/09/23  Jillian Buttery, MD  risperiDONE  (RISPERDAL ) 0.5 MG tablet Take 0.5 mg by mouth daily.    [provider]  risperiDONE  (RISPERDAL ) 3 MG tablet Take 3 mg by mouth at bedtime. 05/16/19   [provider]  sertraline  (ZOLOFT ) 50 MG tablet Take 50 mg by mouth daily.  [provider]      Allergies    Patient has no known allergies.    Review of Systems   Review of Systems A 10 point review of systems was performed and is negative unless otherwise reported in HPI.  Physical Exam Updated Vital Signs BP 104/70   Pulse 94   Temp 97.9 F (36.6 C)   Resp 19   Ht 5' 8 (1.727 m)   Wt 58.4 kg   SpO2 99%   BMI 19.58 kg/m  Physical Exam General: Normal appearing male, lying in bed.  HEENT: PERRLA, Sclera anicteric, MMM, trachea midline.  Cardiology: RRR, no murmurs/rubs/gallops. BL radial and DP pulses equal bilaterally.  Resp: Normal  respiratory rate and effort. CTAB, no wheezes, rhonchi, crackles.  Abd: Soft, non-tender, non-distended. No rebound tenderness or guarding.  GU: Deferred. MSK: 1+ pitting peripheral edema symmetric in BL LEs. No signs of trauma. Extremities without deformity or TTP. No cyanosis or clubbing. Skin: warm, dry.  Neuro: Alert, answers yes/no to some questions, CNs II-XII grossly intact. MAEs. Sensation grossly intact.    ED Results / Procedures / Treatments   Labs (all labs ordered are listed, but only abnormal results are displayed) Labs Reviewed  BASIC METABOLIC PANEL WITH GFR - Abnormal; Notable for the following components:      Result Value   CO2 19 (*)    Glucose, Bld 139 (*)    BUN 36 (*)    Creatinine, Ser 1.87 (*)    GFR, Estimated 37 (*)    All other components within normal limits  CBC - Abnormal; Notable for the following components:   RBC 3.45 (*)    Hemoglobin 10.2 (*)    HCT 32.7 (*)    All other components within normal limits  BRAIN NATRIURETIC PEPTIDE  URINALYSIS, ROUTINE W REFLEX MICROSCOPIC  HEPATIC FUNCTION PANEL  PROTIME-INR  TROPONIN I (HIGH SENSITIVITY)    EKG EKG Interpretation Date/Time:  Thursday March 07 2024 15:01:45 EDT Ventricular Rate:  80 PR Interval:  204 QRS Duration:  76 QT Interval:  364 QTC Calculation: 419 R Axis:   -28  Text Interpretation: Sinus rhythm with Premature atrial complexes Low voltage QRS Electrical alternans present Confirmed by Franklyn Gills [phone]) on 03/07/2024 4:46:04 PM  Radiology DG Chest 2 View Result Date: 03/07/2024 CLINICAL DATA:  Leg swelling. EXAM: CHEST - 2 VIEW COMPARISON:  Jan 03, 2024. FINDINGS: Mild cardiomegaly is noted. Minimal bibasilar subsegmental atelectasis is noted. Bony thorax is unremarkable. IMPRESSION: Minimal bibasilar subsegmental atelectasis. Electronically Signed   By: Lynwood Landy Raddle M.D.   On: 03/07/2024 15:38    Procedures Procedures    Medications Ordered in ED Medications - No data  to display  ED Course/ Medical Decision Making/ A&P                          Medical Decision Making Amount and/or Complexity of Data Reviewed Labs: ordered. Radiology: ordered. Decision-making details documented in ED Course.  Risk Decision regarding hospitalization.     MDM:    This patient presents to the ED for concern of leg swelling, echo imaging findings of pericardial effusion w/ early tamponade, this involves an extensive number of treatment options, and is a complaint that carries with it a high risk of complications and morbidity.  I considered the following differential and admission for this acute, potentially life threatening condition. Labs show mild AKI and leukocytosis. CXR without PNA or significant  pleural effusion/pulmonary edema. He is not in any distress, has no significant tachycardia, no tachypnea/dyspnea noted, no fever. BP is 104/70 and stable.    Clinical Course as of 03/07/24 1711  Thu Mar 07, 2024  1645 DG Chest 2 View FINDINGS: Mild cardiomegaly is noted. Minimal bibasilar subsegmental atelectasis is noted. Bony thorax is unremarkable.  IMPRESSION: Minimal bibasilar subsegmental atelectasis.   [HN]  1653 Discussed with Trish with cardiology who states that we will need a repeat echocardiogram and admission to medicine. [HN]  1708 Phone number for guardian is Beula [phone] [HN]    Clinical Course User Index [HN] Franklyn Sid SAILOR, MD    Labs: I Ordered, and personally interpreted labs.  The pertinent results include:  those listed above  Imaging Studies ordered: CXR ordered from triage I independently visualized and interpreted imaging. I agree with the radiologist interpretation  Additional history obtained from chart review.  External records from outside source obtained and reviewed including [facility] cardiology  Cardiac Monitoring: The patient was maintained on a cardiac monitor.  I personally viewed and interpreted the cardiac  monitored which showed an underlying rhythm of: NSR  Social Determinants of Health: Lives at group facility  Disposition:  Admit to hospitalist w/ cards following  Co morbidities that complicate the patient evaluation  Past Medical History:  Diagnosis Date   Diabetes mellitus    Hypertension    Mental retardation      Medicines No orders of the defined types were placed in this encounter.   I have reviewed the patients home medicines and have made adjustments as needed  Problem List / ED Course: Problem List Items Addressed This Visit       Genitourinary   AKI (acute kidney injury) (HCC)   Other Visit Diagnoses       Pericardial effusion with cardiac tamponade    -  Primary     Peripheral edema                       This note was created using dictation software, which may contain spelling or grammatical errors.    Franklyn Sid SAILOR, MD 03/07/24 [phone]

## 2024-03-07 NOTE — ED Notes (Signed)
 PT is intellectually developed, will answer questions but doesn't truly understand.Caregivers number is in chart.

## 2024-03-07 NOTE — ED Triage Notes (Signed)
 Pt reports bilateral leg swelling that started yesterday. Pt denies SHOB, pain, or fevers. Pt was sent by cardiology office.

## 2024-03-07 NOTE — Hospital Course (Addendum)
 Fred [name] is a 73 y.o. person living with a history of Intellectual disability, HTN, DMII, and BPH who presented with leg swelling and admitted for concern for cardiac tamponade. Echocardiogram findings were reassuring that there was pericardial effusion without tamponade.   #Pericardial effusion First noted on CT scan on 01/03/24. Was undergoing echocardiogram outpatient with his cardiologist and they were concerned for tamponade, so he was transferred to [facility] for further work-up and treatment. His echocardiogram did not show evidence of increased atrial pressure, but did show a pericardial effusion. There was mild atrial collapse. EKG showed findings consistent with pericardial effusion. His blood pressure has been stable since his presentation to the ED. He has not been tachycardic. Cardiology consulted in the ED, they are not concerned for tamponade. BNP 67.7 and Troponin 5.   Plan: -Plan for diagnostic pericardiocentesis with cardiology tomorrow at 1:30PM -If hypotensive, repeat POCUS or Echo and page cardiology. -Continuous telemetry -Evaluate for cause of pericardial effusion with TSH and pericardial fluid studies   #AKI, resolved His baseline is around 1.00 to 1.20. Elevated to 1.87 on admission. Cardiorenal vs volume depletion. IVC collapse seen on echo. BUN:Cr ratio of 19.2. Suspect pre-renal and gave small IVF bolus. Improved to baseline and at d/c Scr was 1.12.    #DMII On metformin 500mg  BID outpatient. Last A1C 5.5. Can resume at discharge his home metformin. Continued home atorvastatin .    Stable chronic conditions   #HTN BP on admission 108/62 after taking morning meds. -Holding home Lisinopril-Hydrochlorothiazide 20-25 mg x 2 tablets daily, Coreg  12.5mg  daily, Amlodipine  10mg  daily in the setting of low-normal readings and large pericardial effusion with mild collapse. ***   #IDD Intellectual disability without any combative behaviors, able to perform most ADLs on his  own but does not speak in long sentences. Continue home medications of risperidone  0.5 mg in AM and 3 mg at bedtime and benztropine  0.5 mg daily. Sertraline  50mg  daily.   #Allergies  Resumed home loratadine  10 mg daily.     #BPH S/P Turp procedure 5/24. Continued home alfuzosin  10 mg daily.

## 2024-03-07 NOTE — ED Notes (Signed)
Cardiologist @Bedside   

## 2024-03-07 NOTE — Consult Note (Addendum)
 Cardiology Consultation   Patient ID: Fred [name] MRN: [id number]; DOB: 1951-03-29  Admit date: 03/07/2024 Date of Consult: 03/07/2024  PCP:  Pura Lenis, MD   [facility] Providers Cardiologist:  Dr. Youlanda Caprio of [facility]   Patient Profile: Fred [name] is a 73 y.o. male with a hx of hypertension, hyperlipidemia, DM2, recently diagnosed pericardial effusion, CKD stage III and intellectual disability who is being seen 03/07/2024 for the evaluation of large pericardial effusion at the request of Dr. Karna.  History of Present Illness: Mr. [name] is a 73 year old male with past medical history of hypertension, hyperlipidemia, DM2, recently diagnosed pericardial effusion, CKD stage III and intellectual disability.  Patient has problem communicating directly due to underlying intellectual disability.  He has 2 other siblings who also have intellectual disability.  According to outside record, there are 8 family members who rotate to take care of the 3 siblings.  He was diagnosed with moderate pericardial effusion when he presented to the hospital on 01/03/2024 with increased fatigue.  Troponin was mildly elevated at 21--> 20.  Urinalysis shows small hemoglobin, 100 protein but negative nitrite.  CT of abdomen and pelvis without contrast showed mild cardiomegaly with moderate pericardial effusion.  Patient established with Dr. Youlanda Caprio of [facility] cardiology service on 01/23/2024.  A limited echocardiogram was performed on 03/07/2024, this revealed a EF of 60 to 65%, large circumferential pericardial effusion noted anterior and posterior to the heart, no RV collapse was noted but RA collapse was noted and that there was significant respiratory flow variation noted across the mitral and the tricuspid valve suggestive of possible early tamponade.  For this reason patient was sent to [facility] for further evaluation.  He is currently being admitted by internal  medicine teaching service.  Stat echocardiogram was performed in the emergency room which showed collapsible IVC, right atrial collapse but no RV collapse.  Blood pressure was in the 110s, heart rate was in the 80s.  There was no sign of hemodynamic compromise.  Communication with the patient was difficult due to intellectual delay.  Caregiver at bedside providing majority of history.   Past Medical History:  Diagnosis Date   Diabetes mellitus    Hypertension    Mental retardation     History reviewed. No pertinent surgical history.   Home Medications:  Prior to Admission medications   Medication Sig Start Date End Date Taking? Authorizing Provider  alfuzosin  (UROXATRAL ) 10 MG 24 hr tablet Take 10 mg by mouth daily after breakfast.  03/30/20   [provider]  ALLERGY RELIEF 10 MG tablet Take 10 mg by mouth daily.    [provider]  amLODipine  (NORVASC ) 10 MG tablet Take 10 mg by mouth daily.  05/22/19   [provider]  aspirin  EC 81 MG tablet Take 81 mg by mouth daily.    [provider]  atorvastatin  (LIPITOR) 20 MG tablet Take 20 mg by mouth daily.  02/28/18   [provider]  benztropine  (COGENTIN ) 0.5 MG tablet Take 0.5 mg by mouth daily.     [provider]  carvedilol  (COREG ) 6.25 MG tablet Take 1 tablet (6.25 mg total) by mouth 2 (two) times daily with a meal. 01/03/23   Jillian Buttery, MD  ferrous sulfate  325 (65 FE) MG tablet Take 325 mg by mouth every Monday, Wednesday, and Friday.  03/27/18   [provider]  ketoconazole (NIZORAL) 2 % shampoo Apply 1 Application topically 2 (two) times a week.  [provider]  lisinopril-hydrochlorothiazide (ZESTORETIC) 20-25 MG tablet Take by mouth.    [provider]  metFORMIN (GLUCOPHAGE) 500 MG tablet Take 500 mg by mouth 2 (two) times daily.  01/20/15   [provider]  potassium chloride  (KLOR-CON ) 10 MEQ tablet Take 10 mEq by mouth daily.    [provider]  potassium chloride  SA (KLOR-CON  M) 20 MEQ tablet Take 2 tablets (40 mEq total) by mouth daily for 5 days. 01/04/23 01/09/23  Jillian Buttery, MD  risperiDONE  (RISPERDAL ) 0.5 MG tablet Take 0.5 mg by mouth daily.    [provider]  risperiDONE  (RISPERDAL ) 3 MG tablet Take 3 mg by mouth at bedtime. 05/16/19   [provider]  sertraline  (ZOLOFT ) 50 MG tablet Take 50 mg by mouth daily.    [provider]    Scheduled Meds:  [START ON 03/08/2024] alfuzosin   10 mg Oral QPC breakfast   [START ON 03/08/2024] aspirin  EC  81 mg Oral Daily   [START ON 03/08/2024] atorvastatin   20 mg Oral Daily   [START ON 03/08/2024] benztropine   0.5 mg Oral Daily   enoxaparin  (LOVENOX ) injection  30 mg Subcutaneous Q24H   [START ON 03/08/2024] loratadine   10 mg Oral Daily   [START ON 03/08/2024] risperiDONE   0.5 mg Oral Daily   And   risperiDONE   3 mg Oral QHS   [START ON 03/08/2024] sertraline   50 mg Oral Daily   Continuous Infusions:  lactated ringers      PRN Meds: acetaminophen  **OR** acetaminophen , senna-docusate  Allergies:   No Known Allergies  Social History:   Social History   Socioeconomic History   Marital status: Single    Spouse name: Not on file   Number of children: 0   Years of education: Not on file   Highest education level: Not on file  Occupational History   Not on file  Tobacco Use   Smoking status: Former    Current packs/day: 0.00    Types: Cigarettes    Quit date: 06/24/2016    Years since quitting: 7.7   Smokeless tobacco: Never  Substance and Sexual Activity   Alcohol use: No   Drug use: No   Sexual activity: Not on file  Other Topics Concern   Not on file  Social History Narrative   06/24/20 lives at home, has caregivers, guardian [name]   Social Drivers of Health   Financial Resource Strain: Low Risk  (04/26/2023)   Received from [facility]   Overall Financial Resource Strain (CARDIA)    Difficulty of Paying Living  Expenses: Not hard at all  Food Insecurity: No Food Insecurity (04/26/2023)   Received from [facility]   Hunger Vital Sign    Within the past 12 months, you worried that your food would run out before you got the money to buy more.: Never true    Within the past 12 months, the food you bought just didn't last and you didn't have money to get more.: Never true  Transportation Needs: No Transportation Needs (04/26/2023)   Received from [facility] - Transportation    Lack of Transportation (Medical): No    Lack of Transportation (Non-Medical): No  Physical Activity: Unknown (04/26/2023)   Received from [facility]   Exercise Vital Sign    On average, how many days per week do you engage in moderate to strenuous exercise (like a brisk walk)?: 0 days    Minutes of Exercise per Session: Not on  file  Stress: No Stress Concern Present (04/26/2023)   Received from [facility] - Occupational Stress Questionnaire    Feeling of Stress : Not at all  Social Connections: Socially Integrated (04/26/2023)   Received from [facility]   Social Network    How would you rate your social network (family, work, friends)?: Good participation with social networks  Intimate Partner Violence: Not At Risk (04/26/2023)   Received from [facility]   HITS    Over the last 12 months how often did your partner physically hurt you?: Never    Over the last 12 months how often did your partner insult you or talk down to you?: Never    Over the last 12 months how often did your partner threaten you with physical harm?: Never    Over the last 12 months how often did your partner scream or curse at you?: Never    Family History:    Family History  Family history unknown: Yes     ROS:  Please see the history of present illness.   All other ROS reviewed and negative.     Physical Exam/Data: Vitals:   03/07/24 1451 03/07/24 1615 03/07/24 1619  BP:  108/62 104/70   Pulse: 78 94   Resp: 16 19   Temp: 97.9 F (36.6 C)    SpO2: 97% 99%   Weight:   58.4 kg  Height:   5' 8 (1.727 m)   No intake or output data in the 24 hours ending 03/07/24 1829    03/07/2024    4:19 PM 11/21/2022    5:00 AM 11/20/2022    5:00 AM  Last 3 Weights  Weight (lbs) 128 lb 12 oz 128 lb 12 oz 129 lb 3 oz  Weight (kg) 58.4 kg 58.4 kg 58.6 kg     Body mass index is 19.58 kg/m.  General:  Well nourished, well developed, in no acute distress HEENT: normal Neck: no JVD Vascular: No carotid bruits; Distal pulses 2+ bilaterally Cardiac:  normal S1, S2; RRR; no murmur  Lungs:  clear to auscultation bilaterally, no wheezing, rhonchi or rales  Abd: soft, nontender, no hepatomegaly  Ext: no edema Musculoskeletal:  No deformities, unable to assess Skin: warm and dry  Neuro:  CNs 2-12 intact, no focal abnormalities noted Psych: Flat  EKG:  The EKG was personally reviewed and demonstrates: Sinus rhythm with PACs, poor R wave progression in the anterior leads.  No significant ST-T wave changes. Telemetry:  Telemetry was personally reviewed and demonstrates: Sinus rhythm, no significant ventricular ectopy.  Relevant CV Studies:  Echo 03/07/2024  1. Left ventricular ejection fraction, by estimation, is 60 to 65%. The  left ventricle has normal function. The left ventricle has no regional  wall motion abnormalities. There is mild concentric left ventricular  hypertrophy. Left ventricular diastolic  parameters are indeterminate.   2. Right ventricular systolic function is normal. The right ventricular  size is normal.   3. Large pericardial effusion. The pericardial effusion is  circumferential.   4. The mitral valve is normal in structure. Trivial mitral valve  regurgitation. No evidence of mitral stenosis.   5. The aortic valve is tricuspid. There is mild calcification of the  aortic valve. Aortic valve regurgitation is not visualized. Aortic valve   sclerosis/calcification is present, without any evidence of aortic  stenosis.   6. The inferior vena cava is normal in size with greater than 50%  respiratory variability, suggesting right atrial pressure of 3 mmHg.   Conclusion(s)/Recommendation(s): There is a large circumfrential  pericardial effusion there is early diastolic RA collapse and mild  repiratory variation in the mitral inflow but no overt tamponade.   Laboratory Data: High Sensitivity Troponin:   Recent Labs  Lab 03/07/24 1644  TROPONINIHS 5     Chemistry Recent Labs  Lab 03/07/24 1502  NA 140  K 4.4  CL 108  CO2 19*  GLUCOSE 139*  BUN 36*  CREATININE 1.87*  CALCIUM  9.0  GFRNONAA 37*  ANIONGAP 13    Recent Labs  Lab 03/07/24 1502  PROT 6.8  ALBUMIN 3.4*  AST 23  ALT 20  ALKPHOS 56  BILITOT 0.5   Lipids No results for input(s): CHOL, TRIG, HDL, LABVLDL, LDLCALC, CHOLHDL in the last 168 hours.  Hematology Recent Labs  Lab 03/07/24 1502  WBC 6.6  RBC 3.45*  HGB 10.2*  HCT 32.7*  MCV 94.8  MCH 29.6  MCHC 31.2  RDW 15.0  PLT 199   Thyroid No results for input(s): TSH, FREET4 in the last 168 hours.  BNP Recent Labs  Lab 03/07/24 1502  BNP 67.7    DDimer No results for input(s): DDIMER in the last 168 hours.  Radiology/Studies:  ECHOCARDIOGRAM COMPLETE Result Date: 03/07/2024    ECHOCARDIOGRAM REPORT   Patient Name:   YORDI KRAGER Date of Exam: 03/07/2024 Medical Rec #:  [id number]     Height:       68.0 in Accession #:    [id number]    Weight:       128.7 lb Date of Birth:  [birth year]     BSA:          1.695 m Patient Age:    73 years      BP:           117/71 mmHg Patient Gender: M             HR:           77 bpm. Exam Location:  Inpatient Procedure: 2D Echo, Color Doppler and Cardiac Doppler (Both Spectral and Color            Flow Doppler were utilized during procedure). Indications:    Pericardial Effusion  History:        Patient has no prior history of  Echocardiogram examinations.                 Risk Factors:Diabetes and Hypertension.  Sonographer:    Carmelita Hartshorn RDCS, FE, PE Referring Phys: [id number] HAYLEY N NAASZ IMPRESSIONS  1. Left ventricular ejection fraction, by estimation, is 60 to 65%. The left ventricle has normal function. The left ventricle has no regional wall motion abnormalities. There is mild concentric left ventricular hypertrophy. Left ventricular diastolic parameters are indeterminate.  2. Right ventricular systolic function is normal. The right ventricular size is normal.  3. Large pericardial effusion. The pericardial effusion is circumferential.  4. The mitral valve is normal in structure. Trivial mitral valve regurgitation. No evidence of mitral stenosis.  5. The aortic valve is tricuspid. There is mild calcification of the aortic valve. Aortic valve regurgitation is not visualized. Aortic valve sclerosis/calcification is present, without any evidence of aortic stenosis.  6. The inferior vena cava is normal in size with greater than 50% respiratory variability, suggesting right atrial pressure of 3 mmHg. Conclusion(s)/Recommendation(s): There is a large circumfrential pericardial effusion there is early diastolic RA collapse and mild repiratory  variation in the mitral inflow but no overt tamponade. FINDINGS  Left Ventricle: Left ventricular ejection fraction, by estimation, is 60 to 65%. The left ventricle has normal function. The left ventricle has no regional wall motion abnormalities. The left ventricular internal cavity size was normal in size. There is  mild concentric left ventricular hypertrophy. Left ventricular diastolic parameters are indeterminate. Right Ventricle: The right ventricular size is normal. No increase in right ventricular wall thickness. Right ventricular systolic function is normal. Left Atrium: Left atrial size was normal in size. Right Atrium: Right atrial size was normal in size. Pericardium: A large pericardial  effusion is present. The pericardial effusion is circumferential. There is diastolic collapse of the right atrial wall. Mitral Valve: The mitral valve is normal in structure. Trivial mitral valve regurgitation. No evidence of mitral valve stenosis. Tricuspid Valve: The tricuspid valve is normal in structure. Tricuspid valve regurgitation is trivial. No evidence of tricuspid stenosis. Aortic Valve: The aortic valve is tricuspid. There is mild calcification of the aortic valve. Aortic valve regurgitation is not visualized. Aortic valve sclerosis/calcification is present, without any evidence of aortic stenosis. Aortic valve mean gradient measures 4.0 mmHg. Aortic valve peak gradient measures 7.4 mmHg. Aortic valve area, by VTI measures 3.65 cm. Pulmonic Valve: The pulmonic valve was normal in structure. Pulmonic valve regurgitation is trivial. No evidence of pulmonic stenosis. Aorta: The aortic root is normal in size and structure. Venous: The inferior vena cava is normal in size with greater than 50% respiratory variability, suggesting right atrial pressure of 3 mmHg. IAS/Shunts: No atrial level shunt detected by color flow Doppler.  LEFT VENTRICLE PLAX 2D LVIDd:         5.20 cm   Diastology LVIDs:         3.10 cm   LV e' medial:  8.58 cm/s LV PW:         1.20 cm   LV e' lateral: 12.50 cm/s LV IVS:        1.30 cm LVOT diam:     2.30 cm LV SV:         108 LV SV Index:   64 LVOT Area:     4.15 cm  RIGHT VENTRICLE RV S prime:     15.70 cm/s TAPSE (M-mode): 2.1 cm LEFT ATRIUM             Index        RIGHT ATRIUM           Index LA diam:        3.80 cm 2.24 cm/m   RA Area:     17.70 cm LA Vol (A2C):   56.8 ml 33.51 ml/m  RA Volume:   46.90 ml  27.67 ml/m LA Vol (A4C):   53.6 ml 31.63 ml/m LA Biplane Vol: 58.6 ml 34.58 ml/m  AORTIC VALVE AV Area (Vmax):    3.76 cm AV Area (Vmean):   3.73 cm AV Area (VTI):     3.65 cm AV Vmax:           136.00 cm/s AV Vmean:          95.400 cm/s AV VTI:            0.297 m AV  Peak Grad:      7.4 mmHg AV Mean Grad:      4.0 mmHg LVOT Vmax:         123.00 cm/s LVOT Vmean:        85.600 cm/s LVOT  VTI:          0.261 m LVOT/AV VTI ratio: 0.88  AORTA Ao Root diam: 3.50 cm Ao Asc diam:  3.60 cm TRICUSPID VALVE TR Peak grad:   9.4 mmHg TR Vmax:        153.00 cm/s  SHUNTS Systemic VTI:  0.26 m Systemic Diam: 2.30 cm Toribio Fuel MD Electronically signed by Toribio Fuel MD Signature Date/Time: 03/07/2024/6:24:04 PM    Final    DG Chest 2 View Result Date: 03/07/2024 CLINICAL DATA:  Leg swelling. EXAM: CHEST - 2 VIEW COMPARISON:  Jan 03, 2024. FINDINGS: Mild cardiomegaly is noted. Minimal bibasilar subsegmental atelectasis is noted. Bony thorax is unremarkable. IMPRESSION: Minimal bibasilar subsegmental atelectasis. Electronically Signed   By: Lynwood Landy Raddle M.D.   On: 03/07/2024 15:38     Assessment and Plan: Large pericardial effusion: Stat echocardiogram obtained in the emergency room showed early diastolic RA collapse and a moderate respiratory variation in the mitral inflow but no overt tamponade.  Patient is hemodynamically stable.  Will continue observation overnight. Effusion has been present since May noted on CT. Likely slow accumulation. If he remains stable, would favor observation given no O2 requirement and no apparent distress, however, had a hypotensive episode earlier today.  -With AKI, and RA pressure 3 mmHg, query element of dehydration. Consider gentle hydration overnight and monitor response.  - obtain tsh  Hypertension: ok to hold home antihypertensives today.   Hyperlipidemia: On atorvastatin  at home. Continue.   DM2        For questions or updates, please contact [facility] Please consult [email or web address] for contact info under

## 2024-03-07 NOTE — H&P (Signed)
 Date: 03/07/2024               Patient Name:  Fred [name] MRN: [id number]  DOB: [birth year] Age / Sex: 73 y.o., male   PCP: Fred Lenis, MD         Medical Service: Internal Medicine Teaching Service         Attending Physician: Dr. Ronnald Sergeant      First Contact: Melvenia Morrison, MD    Second Contact: Dr. Ozell Nearing, DO          Pager Information: First Contact Pager: [phone]   Second Contact Pager: [phone]   SUBJECTIVE   Chief Complaint: Swelling, pericardial effusion  History of Present Illness: Fred [name] is a 73 y.o. male with PMH of Intellectual disability, DMII, HTN, BPH who presents after being transferred from his cardiologist for echo findings concerning for cardiac tamponade. He was seen at bedside with his caregiver, [name], who provided most of the history. He had some bilateral lower leg swelling two days before presentation which prompted a visit to his cardiologist today. He underwent an echocardiogram today which showed a pericardial effusion with concerns for tamponade, so he was transferred to [facility]. He took all of his morning medications this morning.  He has intellectual disability. He has a caregiver visit him and his brothers most days. His mentation baseline is 1-2 word sentences and agreeing with most statements. He is able to use the restroom on his own but does have trouble wiping. He can feed himself but needs his food cut up on the plate for him.   Endorses constipation with relief of Miralax . Last BM was 2 days ago, 7/8.   Denies SOB, dizziness or drowsiness. Denies chest pain or abdominal pain.   ED Course: Labs significant for elevated creatinine to 1.87. Low hemoglobin to 10.2. Vital signs stable A chest x-ray showed cardiomegaly with bibasilar atelectasis. An echocardiogram was performed which showed pericardial effusion with adequate right ventricle diastolic function, mild atrial collapse, and low JV and IVC pressure. Cardiology was  consulted and they were reassured by the echo findings and vitals that there was no cardiac tamponade at this time.  Meds:  Patient caretaker [name] reported:  -lisinopril-hydrochlorothiazide 20-25 mg x 2 tablets daily   -potassium 10 mEq daily  -loratadine  10 mg daily  -sertraline  50 mg daily  -risperidone  0.5 mg in AM and 3 mg at bedtime  -ASA 81 mg daily  -alfuzosin  10 mg daily  -carvedilol  12.5 mg BID -benztropine  0.5 mg daily  -metformin 500 mg BID  -Amlodipine  10mg  -Atorvastatin  20 mg daily   No outpatient medications have been marked as taking for the 03/07/24 encounter [facility] Encounter).    Past Medical History -BPH -Allergies -T2DM  -IDD -HTN  Past Surgical History History reviewed. No pertinent surgical history.   Social:  Lives With: brothers (has guardian is patient's sister) Support: family, caregiver [name]) Level of Function: dependent with ADLs PCP:  Fred Lenis, MD  Substances: -Tobacco: former quit >9 years ago (approx 1 ppd for several years) -Alcohol: denies currently  -Recreational Drug: denies currently   Family History:  Family History  Family history unknown: Yes    Allergies: Allergies as of 03/07/2024   (No Known Allergies)    Review of Systems: A complete ROS was negative except as per HPI.   OBJECTIVE:   Physical Exam: Blood pressure 104/70, pulse 94, temperature 97.9 F (36.6 C), resp. rate 19, height 5' 8 (1.727 m), weight 58.4 kg,  SpO2 99%.  Constitutional: well-appearing, lying on right side, in no acute distress HENT: normocephalic atraumatic, mucous membranes moist Eyes: conjunctiva non-erythematous Neck: supple, no JVD Cardiovascular: regular rate and rhythm, heart sounds distant. 2+Pedal pulses bilaterally. No friction rub Pulmonary/Chest: normal work of breathing on room air, lungs clear to auscultation bilaterally Abdominal: soft, non-tender, non-distended MSK: normal bulk and tone Neurological: Answers  questions with 1-2 word sentences, 5/5 strength in bilateral upper and lower extremities Skin: warm and dry  Labs: CBC    Component Value Date/Time   WBC 6.6 03/07/2024 1502   RBC 3.45 (L) 03/07/2024 1502   HGB 10.2 (L) 03/07/2024 1502   HCT 32.7 (L) 03/07/2024 1502   PLT 199 03/07/2024 1502   MCV 94.8 03/07/2024 1502   MCH 29.6 03/07/2024 1502   MCHC 31.2 03/07/2024 1502   RDW 15.0 03/07/2024 1502   LYMPHSABS 0.5 (L) 01/03/2024 1314   MONOABS 0.4 01/03/2024 1314   EOSABS 0.0 01/03/2024 1314   BASOSABS 0.0 01/03/2024 1314     CMP     Component Value Date/Time   NA 140 03/07/2024 1502   K 4.4 03/07/2024 1502   CL 108 03/07/2024 1502   CO2 19 (L) 03/07/2024 1502   GLUCOSE 139 (H) 03/07/2024 1502   BUN 36 (H) 03/07/2024 1502   CREATININE 1.87 (H) 03/07/2024 1502   CALCIUM  9.0 03/07/2024 1502   PROT 6.8 03/07/2024 1502   ALBUMIN 3.4 (L) 03/07/2024 1502   AST 23 03/07/2024 1502   ALT 20 03/07/2024 1502   ALKPHOS 56 03/07/2024 1502   BILITOT 0.5 03/07/2024 1502   GFRNONAA 37 (L) 03/07/2024 1502   GFRAA >60 04/19/2020 2008    Imaging: DG Chest 2 View Result Date: 03/07/2024 CLINICAL DATA:  Leg swelling. EXAM: CHEST - 2 VIEW COMPARISON:  Jan 03, 2024. FINDINGS: Mild cardiomegaly is noted. Minimal bibasilar subsegmental atelectasis is noted. Bony thorax is unremarkable. IMPRESSION: Minimal bibasilar subsegmental atelectasis. Electronically Signed   By: Lynwood Landy Raddle M.D.   On: 03/07/2024 15:38     EKG: EKG showed low amplitude QRS complexes and electrical alterans.  ASSESSMENT & PLAN:   Assessment & Plan by Problem: Principal Problem:   Pericardial effusion   Fred [name] is a 73 y.o. person living with a history of Intellectual disability, HTN, DMII, and BPH who presented with leg swelling and admitted for concern for cardiac tamponade. Echocardiogram findings were reassuring that there was pericardial effusion without tamponade.  #Pericardial effusion First  noted on CT scan on 01/03/24. Was undergoing echocardiogram outpatient with his cardiologist and they were concerned for tamponade, so he was transferred to [name] for further work-up and treatment. His echocardiogram did not show evidence of increased atrial pressure, but did show a pericardial effusion. There was mild atrial collapse. EKG showed findings consistent with pericardial effusion. His blood pressure has been stable since his presentation to the ED. He has not been tachycardic. Cardiology consulted in the ED, they are not concerned for tamponade. BNP 67.7 and Troponin 5.  Plan: -Plan for diagnostic pericardiocentesis with cardiology tomorrow at 1:30PM -If hypotensive, repeat POCUS or Echo and page cardiology. -Continuous telemetry -Evaluate for cause of pericardial effusion with TSH and pericardial fluid studies  #AKI His baseline is around 1.00 to 1.20. Elevated to 1.87 on admission. Cardiorenal vs volume depletion. IVC collapse seen on echo. Could benefit from gentle fluids. BUN:Cr ratio of 19.2 Plan: -LR bolus -BMP tomorrow AM to monitor improvement  #DMII On metformin 500mg   BID outpatient. Last A1C 5.5 -Holding Metformin -Daily BMP -Atorvastatin  20mg  daily -ASA 81 mg daily,    Stable chronic conditions  #HTN BP on admission 108/62 after taking morning meds. -Holding home Lisinopril-Hydrochlorothiazide 20-25 mg x 2 tablets daily, Coreg  12.5mg  daily, Amlodipine  10mg  daily in the setting of low-normal readings and large pericardial effusion with mild collapse.   #IDD Intellectual disability without any combative behaviors, able to perform most ADLs on his own but does not speak in long sentences. -Continue home medications of risperidone  0.5 mg in AM and 3 mg at bedtime and benztropine  0.5 mg daily -Sertraline  50mg  daily  #Allergies  -loratadine  10 mg daily   #BPH S/P Turp procedure 5/24 -alfuzosin  10 mg daily   Best practice: Diet: Dysphagia 2 - able to  self feed VTE: Enoxaparin  IVF: LR,Bolus Code: DNR/DNI  Disposition planning: Prior to Admission Living Arrangement: Home, living with brothers with caretaker assistance Anticipated Discharge Location: Home  Dispo: Admit patient to Observation with expected length of stay less than 2 midnights.  Signed: Napoleon Limes, MD Internal Medicine Resident  03/07/2024, 6:20 PM  Please contact IM Residency On-Call Pager at: [phone] or [phone].

## 2024-03-08 ENCOUNTER — Encounter (HOSPITAL_COMMUNITY)
Admission: EM | Disposition: A | Discharge status: Home / Self Care | Payer: Self-pay | Source: Ambulatory Visit | Attending: Internal Medicine

## 2024-03-08 DIAGNOSIS — D72829 Elevated white blood cell count, unspecified: Secondary | ICD-10-CM | POA: Diagnosis present

## 2024-03-08 DIAGNOSIS — N179 Acute kidney failure, unspecified: Secondary | ICD-10-CM | POA: Diagnosis present

## 2024-03-08 DIAGNOSIS — F79 Unspecified intellectual disabilities: Secondary | ICD-10-CM

## 2024-03-08 DIAGNOSIS — E119 Type 2 diabetes mellitus without complications: Secondary | ICD-10-CM | POA: Diagnosis not present

## 2024-03-08 DIAGNOSIS — Z87891 Personal history of nicotine dependence: Secondary | ICD-10-CM | POA: Diagnosis not present

## 2024-03-08 DIAGNOSIS — I959 Hypotension, unspecified: Secondary | ICD-10-CM | POA: Diagnosis present

## 2024-03-08 DIAGNOSIS — Z66 Do not resuscitate: Secondary | ICD-10-CM | POA: Diagnosis present

## 2024-03-08 DIAGNOSIS — I3139 Other pericardial effusion (noninflammatory): Secondary | ICD-10-CM | POA: Diagnosis present

## 2024-03-08 DIAGNOSIS — N401 Enlarged prostate with lower urinary tract symptoms: Secondary | ICD-10-CM | POA: Diagnosis present

## 2024-03-08 DIAGNOSIS — E1122 Type 2 diabetes mellitus with diabetic chronic kidney disease: Secondary | ICD-10-CM | POA: Diagnosis present

## 2024-03-08 DIAGNOSIS — M7989 Other specified soft tissue disorders: Secondary | ICD-10-CM | POA: Diagnosis present

## 2024-03-08 DIAGNOSIS — J90 Pleural effusion, not elsewhere classified: Secondary | ICD-10-CM | POA: Diagnosis not present

## 2024-03-08 DIAGNOSIS — D631 Anemia in chronic kidney disease: Secondary | ICD-10-CM | POA: Diagnosis present

## 2024-03-08 DIAGNOSIS — Z7984 Long term (current) use of oral hypoglycemic drugs: Secondary | ICD-10-CM | POA: Diagnosis not present

## 2024-03-08 DIAGNOSIS — Z7982 Long term (current) use of aspirin: Secondary | ICD-10-CM | POA: Diagnosis not present

## 2024-03-08 DIAGNOSIS — E1159 Type 2 diabetes mellitus with other circulatory complications: Secondary | ICD-10-CM | POA: Diagnosis not present

## 2024-03-08 DIAGNOSIS — R000 Tachycardia, unspecified: Secondary | ICD-10-CM | POA: Diagnosis not present

## 2024-03-08 DIAGNOSIS — I509 Heart failure, unspecified: Secondary | ICD-10-CM | POA: Diagnosis not present

## 2024-03-08 DIAGNOSIS — I491 Atrial premature depolarization: Secondary | ICD-10-CM | POA: Diagnosis not present

## 2024-03-08 DIAGNOSIS — N138 Other obstructive and reflux uropathy: Secondary | ICD-10-CM | POA: Diagnosis present

## 2024-03-08 DIAGNOSIS — F09 Unspecified mental disorder due to known physiological condition: Secondary | ICD-10-CM | POA: Diagnosis present

## 2024-03-08 DIAGNOSIS — E785 Hyperlipidemia, unspecified: Secondary | ICD-10-CM | POA: Diagnosis present

## 2024-03-08 DIAGNOSIS — I5023 Acute on chronic systolic (congestive) heart failure: Secondary | ICD-10-CM | POA: Diagnosis present

## 2024-03-08 DIAGNOSIS — I152 Hypertension secondary to endocrine disorders: Secondary | ICD-10-CM | POA: Diagnosis present

## 2024-03-08 DIAGNOSIS — N183 Chronic kidney disease, stage 3 unspecified: Secondary | ICD-10-CM | POA: Diagnosis present

## 2024-03-08 DIAGNOSIS — Z79899 Other long term (current) drug therapy: Secondary | ICD-10-CM | POA: Diagnosis not present

## 2024-03-08 DIAGNOSIS — K5900 Constipation, unspecified: Secondary | ICD-10-CM | POA: Diagnosis present

## 2024-03-08 DIAGNOSIS — D649 Anemia, unspecified: Secondary | ICD-10-CM | POA: Diagnosis not present

## 2024-03-08 DIAGNOSIS — I319 Disease of pericardium, unspecified: Secondary | ICD-10-CM | POA: Diagnosis present

## 2024-03-08 DIAGNOSIS — I5020 Unspecified systolic (congestive) heart failure: Secondary | ICD-10-CM | POA: Diagnosis not present

## 2024-03-08 LAB — CBC
HCT: 34.3 % — ABNORMAL LOW (ref 39.0–52.0)
Hemoglobin: 10.7 g/dL — ABNORMAL LOW (ref 13.0–17.0)
MCH: 29.3 pg (ref 26.0–34.0)
MCHC: 31.2 g/dL (ref 30.0–36.0)
MCV: 94 fL (ref 80.0–100.0)
Platelets: 191 K/uL (ref 150–400)
RBC: 3.65 MIL/uL — ABNORMAL LOW (ref 4.22–5.81)
RDW: 14.9 % (ref 11.5–15.5)
WBC: 6 K/uL (ref 4.0–10.5)
nRBC: 0 % (ref 0.0–0.2)

## 2024-03-08 LAB — BASIC METABOLIC PANEL WITH GFR
Anion gap: 11 (ref 5–15)
BUN: 28 mg/dL — ABNORMAL HIGH (ref 8–23)
CO2: 23 mmol/L (ref 22–32)
Calcium: 9.2 mg/dL (ref 8.9–10.3)
Chloride: 108 mmol/L (ref 98–111)
Creatinine, Ser: 1.41 mg/dL — ABNORMAL HIGH (ref 0.61–1.24)
GFR, Estimated: 52 mL/min — ABNORMAL LOW (ref >60)
Glucose, Bld: 99 mg/dL (ref 70–99)
Potassium: 3.8 mmol/L (ref 3.5–5.1)
Sodium: 142 mmol/L (ref 135–145)

## 2024-03-08 LAB — SEDIMENTATION RATE: Sed Rate: 32 mm/h — ABNORMAL HIGH (ref 0–16)

## 2024-03-08 LAB — C-REACTIVE PROTEIN: CRP: 0.5 mg/dL (ref <1.0)

## 2024-03-08 SURGERY — PERICARDIOCENTESIS
Anesthesia: LOCAL

## 2024-03-08 MED ORDER — ENOXAPARIN SODIUM 40 MG/0.4ML IJ SOSY
40.0000 mg | PREFILLED_SYRINGE | INTRAMUSCULAR | Status: DC
  Administered 2024-03-09 – 2024-03-12 (×4): 40 mg via SUBCUTANEOUS
  Filled 2024-03-08 (×4): qty 0.4

## 2024-03-08 NOTE — Progress Notes (Addendum)
 HD#0 SUBJECTIVE:  Patient Summary: Fred [name] is a 73 y.o. with a pertinent PMH of Intellectual disability, DMII, HTN, and BPH, who presented with a pericardial effusion transferred from his cardiologist with concerns for tamponade and admitted for observation and further workup.   Overnight Events: None  Interim History: Patient spoke in one word answers. He denies BM or urination. Patient denies any swelling in his legs.   Cardiology planning tentatively for paracentesis today, pending the patient's level of tolerance with the procedure  OBJECTIVE:  Vital Signs: Vitals:   03/08/24 0545 03/08/24 0845 03/08/24 0850 03/08/24 0930  BP: 117/64 (!) 140/91  125/79  Pulse: 64  75 72  Resp: 18 18  18   Temp:    97.6 F (36.4 C)  TempSrc:    Axillary  SpO2: 100%   100%  Weight:      Height:       SpO2: 100 %  Filed Weights   03/07/24 1619  Weight: 58.4 kg    No intake or output data in the 24 hours ending 03/08/24 0936 Net IO Since Admission: No IO data has been entered for this period [03/08/24 0936]  Physical Exam: Physical Exam Constitutional:      General: He is not in acute distress.    Appearance: He is not ill-appearing.  Cardiovascular:     Rate and Rhythm: Normal rate and regular rhythm.     Heart sounds:     No friction rub.     Comments: Heart sounds distant Pulmonary:     Effort: Pulmonary effort is normal. No respiratory distress.  Abdominal:     General: There is no distension.     Tenderness: There is no abdominal tenderness.  Musculoskeletal:     Right lower leg: Edema present.     Left lower leg: Edema present.  Neurological:     Mental Status: He is alert. Mental status is at baseline.      Patient Lines/Drains/Airways Status     Active Line/Drains/Airways     Name Placement date Placement time Site Days   Peripheral IV 03/07/24 20 G Left Antecubital 03/07/24  1851  Antecubital  1            Pertinent labs and imaging:       Latest Ref Rng & Units 03/08/2024    5:06 AM 03/07/2024    3:02 PM 01/03/2024    1:26 PM  CBC  WBC 4.0 - 10.5 K/uL 6.0  6.6    Hemoglobin 13.0 - 17.0 g/dL 89.2  89.7  9.5   Hematocrit 39.0 - 52.0 % 34.3  32.7  28.0   Platelets 150 - 400 K/uL 191  199         Latest Ref Rng & Units 03/08/2024    5:06 AM 03/07/2024    3:02 PM 01/03/2024    6:56 PM  CMP  Glucose 70 - 99 mg/dL 99  860  806   BUN 8 - 23 mg/dL 28  36  28   Creatinine 0.61 - 1.24 mg/dL 8.58  8.12  8.79   Sodium 135 - 145 mmol/L 142  140  139   Potassium 3.5 - 5.1 mmol/L 3.8  4.4  3.2   Chloride 98 - 111 mmol/L 108  108  100   CO2 22 - 32 mmol/L 23  19  28    Calcium  8.9 - 10.3 mg/dL 9.2  9.0  9.3   Total Protein 6.5 - 8.1 g/dL  6.8  6.1   Total Bilirubin 0.0 - 1.2 mg/dL  0.5  0.4   Alkaline Phos 38 - 126 U/L  56  86   AST 15 - 41 U/L  23  90   ALT 0 - 44 U/L  20  69     Latest Reference Range & Units 03/07/24 16:30  TSH 0.350 - 4.500 uIU/mL 1.045    ECHOCARDIOGRAM COMPLETE Result Date: 03/07/2024    ECHOCARDIOGRAM REPORT   Patient Name:   Fred [name] Date of Exam: 03/07/2024 Medical Rec #:  [id number]     Height:       68.0 in Accession #:    [id number]    Weight:       128.7 lb Date of Birth:  August 11, 1951     BSA:          1.695 m Patient Age:    73 years      BP:           117/71 mmHg Patient Gender: M             HR:           77 bpm. Exam Location:  Inpatient Procedure: 2D Echo, Color Doppler and Cardiac Doppler (Both Spectral and Color            Flow Doppler were utilized during procedure). Indications:    Pericardial Effusion  History:        Patient has no prior history of Echocardiogram examinations.                 Risk Factors:Diabetes and Hypertension.  Sonographer:    Carmelita Hartshorn RDCS, FE, PE Referring Phys: [id number] HAYLEY N NAASZ IMPRESSIONS  1. Left ventricular ejection fraction, by estimation, is 60 to 65%. The left ventricle has normal function. The left ventricle has no regional wall motion abnormalities. There  is mild concentric left ventricular hypertrophy. Left ventricular diastolic parameters are indeterminate.  2. Right ventricular systolic function is normal. The right ventricular size is normal.  3. Large pericardial effusion. The pericardial effusion is circumferential.  4. The mitral valve is normal in structure. Trivial mitral valve regurgitation. No evidence of mitral stenosis.  5. The aortic valve is tricuspid. There is mild calcification of the aortic valve. Aortic valve regurgitation is not visualized. Aortic valve sclerosis/calcification is present, without any evidence of aortic stenosis.  6. The inferior vena cava is normal in size with greater than 50% respiratory variability, suggesting right atrial pressure of 3 mmHg. Conclusion(s)/Recommendation(s): There is a large circumfrential pericardial effusion there is early diastolic RA collapse and mild repiratory variation in the mitral inflow but no overt tamponade. FINDINGS  Left Ventricle: Left ventricular ejection fraction, by estimation, is 60 to 65%. The left ventricle has normal function. The left ventricle has no regional wall motion abnormalities. The left ventricular internal cavity size was normal in size. There is  mild concentric left ventricular hypertrophy. Left ventricular diastolic parameters are indeterminate. Right Ventricle: The right ventricular size is normal. No increase in right ventricular wall thickness. Right ventricular systolic function is normal. Left Atrium: Left atrial size was normal in size. Right Atrium: Right atrial size was normal in size. Pericardium: A large pericardial effusion is present. The pericardial effusion is circumferential. There is diastolic collapse of the right atrial wall. Mitral Valve: The mitral valve is normal in structure. Trivial mitral valve regurgitation. No evidence of mitral valve stenosis. Tricuspid Valve: The tricuspid valve is normal in structure.  Tricuspid valve regurgitation is trivial. No  evidence of tricuspid stenosis. Aortic Valve: The aortic valve is tricuspid. There is mild calcification of the aortic valve. Aortic valve regurgitation is not visualized. Aortic valve sclerosis/calcification is present, without any evidence of aortic stenosis. Aortic valve mean gradient measures 4.0 mmHg. Aortic valve peak gradient measures 7.4 mmHg. Aortic valve area, by VTI measures 3.65 cm. Pulmonic Valve: The pulmonic valve was normal in structure. Pulmonic valve regurgitation is trivial. No evidence of pulmonic stenosis. Aorta: The aortic root is normal in size and structure. Venous: The inferior vena cava is normal in size with greater than 50% respiratory variability, suggesting right atrial pressure of 3 mmHg. IAS/Shunts: No atrial level shunt detected by color flow Doppler.  LEFT VENTRICLE PLAX 2D LVIDd:         5.20 cm   Diastology LVIDs:         3.10 cm   LV e' medial:  8.58 cm/s LV PW:         1.20 cm   LV e' lateral: 12.50 cm/s LV IVS:        1.30 cm LVOT diam:     2.30 cm LV SV:         108 LV SV Index:   64 LVOT Area:     4.15 cm  RIGHT VENTRICLE RV S prime:     15.70 cm/s TAPSE (M-mode): 2.1 cm LEFT ATRIUM             Index        RIGHT ATRIUM           Index LA diam:        3.80 cm 2.24 cm/m   RA Area:     17.70 cm LA Vol (A2C):   56.8 ml 33.51 ml/m  RA Volume:   46.90 ml  27.67 ml/m LA Vol (A4C):   53.6 ml 31.63 ml/m LA Biplane Vol: 58.6 ml 34.58 ml/m  AORTIC VALVE AV Area (Vmax):    3.76 cm AV Area (Vmean):   3.73 cm AV Area (VTI):     3.65 cm AV Vmax:           136.00 cm/s AV Vmean:          95.400 cm/s AV VTI:            0.297 m AV Peak Grad:      7.4 mmHg AV Mean Grad:      4.0 mmHg LVOT Vmax:         123.00 cm/s LVOT Vmean:        85.600 cm/s LVOT VTI:          0.261 m LVOT/AV VTI ratio: 0.88  AORTA Ao Root diam: 3.50 cm Ao Asc diam:  3.60 cm TRICUSPID VALVE TR Peak grad:   9.4 mmHg TR Vmax:        153.00 cm/s  SHUNTS Systemic VTI:  0.26 m Systemic Diam: 2.30 cm Toribio Fuel MD  Electronically signed by Toribio Fuel MD Signature Date/Time: 03/07/2024/6:24:04 PM    Final    DG Chest 2 View Result Date: 03/07/2024 CLINICAL DATA:  Leg swelling. EXAM: CHEST - 2 VIEW COMPARISON:  Jan 03, 2024. FINDINGS: Mild cardiomegaly is noted. Minimal bibasilar subsegmental atelectasis is noted. Bony thorax is unremarkable. IMPRESSION: Minimal bibasilar subsegmental atelectasis. Electronically Signed   By: Lynwood Landy Raddle M.D.   On: 03/07/2024 15:38    ASSESSMENT/PLAN:  Assessment: Principal Problem:   Pericardial effusion Active  Problems:   Anemia   Intellectual disability   Type 2 diabetes mellitus without complication, without long-term current use of insulin  (HCC)  Kyser Defranco is a 73 y.o. person living with a history of Intellectual disability, HTN, DMII, and BPH who presented with leg swelling and admitted for concern for cardiac tamponade. Echocardiogram findings were reassuring that there was pericardial effusion without tamponade. Cardiology plans for a non-emergent pericardiocentesis today for diagnostic purposes.  Plan: #Pericardial effusion  First noted on CT scan on 01/03/24. Was undergoing echocardiogram outpatient with his cardiologist and they were concerned for tamponade, so he was transferred to [facility] for further work-up and treatment. His echocardiogram here showed a large circumfrential pericardial effusion with early diastolic RA collapse and mild  repiratory variation in the mitral inflow but no overt tamponade. EKG showed findings consistent with pericardial effusion. His blood pressure has been stable since his presentation to the ED. He has not been tachycardic. Cardiology consulted in the ED, they are not concerned for tamponade. BNP 67.7 and Troponin 5. TSH normal   Plan: -Plan for diagnostic pericardiocentesis with cardiology today at 1:30PM, sister [name] will need to provide consent -If hypotensive, repeat POCUS or Echo and page  cardiology. -Continuous telemetry -Evaluate for cause of pericardial effusion with pericardial fluid studies; ESR/CRP   #AKI His baseline is around 1.00 to 1.20. Elevated to 1.87 on admission. Cardiorenal vs volume depletion. IVC collapse seen on echo. Received 500mL LR bolus with improvement of his Cr to 1.41 Plan: -Encourage oral hydration -BMP daily.   #DMII  On metformin 500mg  BID outpatient. Last A1C 5.5. His glucose was 99 BMP this morning.  -Holding Metformin -Daily BMP -Atorvastatin  20mg  daily -ASA 81 mg daily,     Stable chronic conditions   #HTN  BP on admission 108/62 after taking morning meds. BP 125/79 this morning. -Holding home Lisinopril-Hydrochlorothiazide 20-25 mg x 2 tablets daily, Coreg  12.5mg  daily, Amlodipine  10mg  daily in the setting of low-normal readings and large pericardial effusion with mild collapse.    #IDD Intellectual disability without any combative behaviors, able to perform most ADLs on his own but does not speak in long sentences. -Continue home medications of risperidone  0.5 mg in AM and 3 mg at bedtime and benztropine  0.5 mg daily -Sertraline  50mg  daily   #Allergies  -loratadine  10 mg daily    #BPH S/P Turp procedure 5/24 -alfuzosin  10 mg daily   Best Practice: Diet: NPO VTE: enoxaparin  (LOVENOX ) injection 30 mg Start: 03/07/24 2200 Code: DNR/DNI  Disposition planning: Family Contact: [name] Daring (sister and MPOA) phone number [phone], DISPO: Anticipated discharge tomorrow to Home pending pericardiocentesis.  Signature:  Melvenia Napoleon Jolynn Davene Internal Medicine Residency  9:36 AM, 03/08/2024  On Call pager [phone]

## 2024-03-08 NOTE — ED Notes (Signed)
 Patient was soiled so I changed him and the bed with the help from his nurse.

## 2024-03-08 NOTE — Progress Notes (Addendum)
 Progress Note  Patient Name: Fred [name] Date of Encounter: 03/08/2024 [facility] Cardiologist: None   Interval Summary   No complaints but seen without family or caregiver at bedside. Patient sitting up right breathing comfortably, no distress. Sitting with money in his hand, but unable to describe for me if he needs something or if he is having any pain.   Vital Signs Vitals:   03/08/24 0545 03/08/24 0845 03/08/24 0850 03/08/24 0930  BP: 117/64 (!) 140/91  125/79  Pulse: 64  75 72  Resp: 18 18  18   Temp:    97.6 F (36.4 C)  TempSrc:    Axillary  SpO2: 100%   100%  Weight:      Height:       No intake or output data in the 24 hours ending 03/08/24 1016    03/07/2024    4:19 PM 11/21/2022    5:00 AM 11/20/2022    5:00 AM  Last 3 Weights  Weight (lbs) 128 lb 12 oz 128 lb 12 oz 129 lb 3 oz  Weight (kg) 58.4 kg 58.4 kg 58.6 kg      Telemetry/ECG  SR - Personally Reviewed  Physical Exam  GEN: No acute distress.   Neck: No JVD Cardiac: RRR, no murmurs, rubs, or gallops.  Respiratory: Clear to auscultation bilaterally. GI: Soft, nontender, non-distended  MS: No edema  Assessment & Plan  #Large pericardial effusion - clinically no tamponade. TSH normal.  - clinically stable, so we will need to talk to [facility] about necessity of pericardiocentesis. Could possibly be followed up closely as outpatient with serial echos. My concern is his ability to follow commands and lay still and flat for procedure.  - procedure pending discussion with POA.   #AKI - responded to fluids. Likely prerenal dehydration.     For questions or updates, please contact [facility] Please consult [email or web address] for contact info under       Signed, Soyla DELENA Merck, MD   ADDENDUM: Patient's POA Fred [name] was called today by me personally to discuss feasibility of pericardiocentesis.  We spoke for approximately 10 minutes about patient's usual state of being and his  tolerability of procedures and sedation.  Fred [name] expressed concerns that previously tasks had had to be repeated due to patient's inability to follow commands and stay still.  We discussed that overall he is clinically stable and feels well objectively.  I am unable to elicit symptoms from the patient due to his developmental delay.  Fred [name] and I agreed that while an etiology for the pericardial effusion is not yet known, if not emergent, pericardiocentesis may need to be deferred given our risk-benefit analysis and concerns that if he is unable to follow commands and lay flat on the Table during procedure, this may introduce more risk than benefit.  I suspect he can be followed closely with outpatient echoes to provide surveillance of the pericardial effusion and if agreed upon with his primary cardiologist may be able to have pericardiocentesis electively if effusion seems to be slowly progressing.  Fortunately this appears to be relatively stable compared to a CT scan performed in May and there has been no hemodynamic compromise.  In fact the patient has experienced hypertension while in hospital due to holding of his antihypertensive therapy.  This is all reassuring for absence of tamponade physiology.  He responded well to IV fluids and hypotensive episode noted in cardiologist office may have been related to hypovolemia intravascularly.  Recommend observing  the patient overnight on the medicine service and we will repeat a limited echo in the morning to provide interval surveillance of the effusion.  If stable with no tamponade physiology, could consider discharge from the hospital from a cardiovascular perspective and close follow-up in the cardiologist office next week.

## 2024-03-08 NOTE — ED Notes (Signed)
 Pt to floor with transport

## 2024-03-09 ENCOUNTER — Inpatient Hospital Stay (HOSPITAL_COMMUNITY)

## 2024-03-09 ENCOUNTER — Encounter (HOSPITAL_COMMUNITY)
Admission: EM | Disposition: A | Discharge status: Home / Self Care | Payer: Self-pay | Source: Ambulatory Visit | Attending: Internal Medicine

## 2024-03-09 DIAGNOSIS — I509 Heart failure, unspecified: Secondary | ICD-10-CM | POA: Diagnosis not present

## 2024-03-09 DIAGNOSIS — I3139 Other pericardial effusion (noninflammatory): Secondary | ICD-10-CM

## 2024-03-09 DIAGNOSIS — N179 Acute kidney failure, unspecified: Secondary | ICD-10-CM

## 2024-03-09 LAB — BASIC METABOLIC PANEL WITH GFR
Anion gap: 5 (ref 5–15)
BUN: 20 mg/dL (ref 8–23)
CO2: 26 mmol/L (ref 22–32)
Calcium: 8.7 mg/dL — ABNORMAL LOW (ref 8.9–10.3)
Chloride: 110 mmol/L (ref 98–111)
Creatinine, Ser: 1.12 mg/dL (ref 0.61–1.24)
GFR, Estimated: >60 mL/min (ref >60)
Glucose, Bld: 86 mg/dL (ref 70–99)
Potassium: 3.5 mmol/L (ref 3.5–5.1)
Sodium: 141 mmol/L (ref 135–145)

## 2024-03-09 LAB — BODY FLUID CELL COUNT WITH DIFFERENTIAL
Eos, Fluid: 0 %
Lymphs, Fluid: 30 %
Monocyte-Macrophage-Serous Fluid: 66 % (ref 50–90)
Neutrophil Count, Fluid: 4 % (ref 0–25)
Total Nucleated Cell Count, Fluid: 1975 uL — ABNORMAL HIGH (ref 0–1000)

## 2024-03-09 LAB — ECHOCARDIOGRAM LIMITED
Height: 68 in
Height: 68 in
S' Lateral: 2.1 cm
S' Lateral: 3.3 cm
Weight: 2059.98 [oz_av]
Weight: 2059.98 [oz_av]

## 2024-03-09 LAB — GLUCOSE, CAPILLARY
Glucose-Capillary: 172 mg/dL — ABNORMAL HIGH (ref 70–99)
Glucose-Capillary: 175 mg/dL — ABNORMAL HIGH (ref 70–99)

## 2024-03-09 LAB — MRSA NEXT GEN BY PCR, NASAL: MRSA by PCR Next Gen: NOT DETECTED

## 2024-03-09 SURGERY — PERICARDIOCENTESIS
Anesthesia: LOCAL

## 2024-03-09 MED ORDER — COLCHICINE 0.6 MG PO TABS
0.6000 mg | ORAL_TABLET | Freq: Every day | ORAL | Status: DC
  Administered 2024-03-10 – 2024-03-13 (×4): 0.6 mg via ORAL
  Filled 2024-03-09 (×4): qty 1

## 2024-03-09 MED ORDER — LIDOCAINE HCL (PF) 1 % IJ SOLN
INTRAMUSCULAR | Status: AC
  Filled 2024-03-09: qty 30

## 2024-03-09 MED ORDER — SODIUM CHLORIDE 0.9 % IV SOLN: INTRAVENOUS | Status: DC

## 2024-03-09 MED ORDER — LIDOCAINE HCL (PF) 1 % IJ SOLN
INTRAMUSCULAR | Status: DC | PRN
  Administered 2024-03-09: 10 mL

## 2024-03-09 MED ORDER — INSULIN ASPART 100 UNIT/ML IJ SOLN
0.0000 [IU] | Freq: Three times a day (TID) | INTRAMUSCULAR | Status: DC
  Administered 2024-03-09: 3 [IU] via SUBCUTANEOUS
  Administered 2024-03-10: 8 [IU] via SUBCUTANEOUS
  Administered 2024-03-11 – 2024-03-12 (×2): 3 [IU] via SUBCUTANEOUS
  Administered 2024-03-12: 2 [IU] via SUBCUTANEOUS
  Administered 2024-03-13: 3 [IU] via SUBCUTANEOUS

## 2024-03-09 MED ORDER — CHLORHEXIDINE GLUCONATE CLOTH 2 % EX PADS
6.0000 | MEDICATED_PAD | Freq: Every day | CUTANEOUS | Status: DC
  Administered 2024-03-09 – 2024-03-12 (×4): 6 via TOPICAL

## 2024-03-09 SURGICAL SUPPLY — 3 items
PACK CARDIAC CATHETERIZATION (CUSTOM PROCEDURE TRAY) IMPLANT
STATION PROTECTION PRESSURIZED (MISCELLANEOUS) IMPLANT
TRAY PERICARDIOCENTESIS 6FX60 (TRAY / TRAY PROCEDURE) IMPLANT

## 2024-03-09 NOTE — Consult Note (Signed)
 NAME:  Fred [name], MRN:  [id number], DOB:  1951-08-28, LOS: 1 ADMISSION DATE:  03/07/2024, CONSULTATION DATE:  03/09/24 REFERRING MD:  BERNIS, CHIEF COMPLAINT:  leg swelling   History of Present Illness:  73 year old man w/ hx of cognitive delay, DM2, HTN, BPH p/w leg swelling found to have large pericardial effusion with some tamponade features.  Monitored conservatively overnight with repeat echo continuing to show some concerning signs so sent to cath lab for definitive management.  650cc serous fluid removed and drain left in place, patient moved to ICU.  PCCM to take over for IMTS while in ICU.  Patient pleasant and would like food.  Little insight into condition.  Pertinent  Medical History   Past Medical History:  Diagnosis Date   Diabetes mellitus    Hypertension    Mental retardation      Significant Hospital Events: Including procedures, antibiotic start and stop dates in addition to other pertinent events   7/10 admit 7/12 pericardial drain  Interim History / Subjective:  Consult  Objective    Blood pressure (!) 141/105, pulse 86, temperature 97.8 F (36.6 C), temperature source Oral, resp. rate 17, height 5' 8 (1.727 m), weight 58.4 kg, SpO2 100%.        Intake/Output Summary (Last 24 hours) at 03/09/2024 1434 Last data filed at 03/09/2024 1100 Gross per 24 hour  Intake 240 ml  Output 2650 ml  Net -2410 ml   Filed Weights   03/07/24 1619  Weight: 58.4 kg    Examination: General: no distress HENT: +temporal wasting, MM dry Lungs: clear no wheezing Cardiovascular: irregular, drain in place with small amount serous output Abdomen: soft, +BS Extremities: +muscle wasting 1+ edema Neuro: moves to command Skin: no rashes  CBC, bmp look fine Sed rate minimally up Fluid labs pending  Resolved problem list   Assessment and Plan  Decompensated heart failure due to threatened obstructive shock from pericardial effusion + tamponade physiology- cause of  pericardial effusion unclear, path pending; s/p drain 03/09/24 BPH, cognitive delay, HTN DM2  - Pericardial drain and any diuretic challenge per cardiology team - Dysphagia diet - ACHS CBG - Patient is DNR/DNI - PTA meds as ordered, empiric colchicine  - f/u pericardial fluid studies  Best Practice (right click and Reselect all SmartList Selections daily)   Diet/type: Regular consistency (see orders) DVT prophylaxis not indicated Pressure ulcer(s): N/A GI prophylaxis: N/A Lines: N/A Foley:  N/A Code Status:  DNR Last date of multidisciplinary goals of care discussion [family updated by cardiology and IMTS]  Labs   CBC: Recent Labs  Lab 03/07/24 1502 03/08/24 0506  WBC 6.6 6.0  HGB 10.2* 10.7*  HCT 32.7* 34.3*  MCV 94.8 94.0  PLT 199 191    Basic Metabolic Panel: Recent Labs  Lab 03/07/24 1502 03/08/24 0506 03/09/24 0418  NA 140 142 141  K 4.4 3.8 3.5  CL 108 108 110  CO2 19* 23 26  GLUCOSE 139* 99 86  BUN 36* 28* 20  CREATININE 1.87* 1.41* 1.12  CALCIUM  9.0 9.2 8.7*   GFR: Estimated Creatinine Clearance: 47.8 mL/min (by C-G formula based on SCr of 1.12 mg/dL). Recent Labs  Lab 03/07/24 1502 03/08/24 0506  WBC 6.6 6.0    Liver Function Tests: Recent Labs  Lab 03/07/24 1502  AST 23  ALT 20  ALKPHOS 56  BILITOT 0.5  PROT 6.8  ALBUMIN 3.4*   No results for input(s): LIPASE, AMYLASE in the last 168 hours. No  results for input(s): AMMONIA in the last 168 hours.  ABG    Component Value Date/Time   HCO3 28.5 (H) 01/03/2024 1326   TCO2 30 01/03/2024 1326   O2SAT 32 01/03/2024 1326     Coagulation Profile: Recent Labs  Lab 03/07/24 1845  INR 1.2    Cardiac Enzymes: No results for input(s): CKTOTAL, CKMB, CKMBINDEX, TROPONINI in the last 168 hours.  HbA1C: Hgb A1c MFr Bld  Date/Time Value Ref Range Status  11/02/2022 06:20 AM 5.5 4.8 - 5.6 % Final    Comment:    (NOTE)         Prediabetes: 5.7 - 6.4         Diabetes:  >6.4         Glycemic control for adults with diabetes: <7.0     CBG: No results for input(s): GLUCAP in the last 168 hours.  Review of Systems:   Unfortunately due to cognitive delay does not answer questions reliably  Says no to pain or SOB Says yes to hunger  Past Medical History:  He,  has a past medical history of Diabetes mellitus, Hypertension, and Mental retardation.   Surgical History:  History reviewed. No pertinent surgical history.   Social History:   reports that he quit smoking about 7 years ago. His smoking use included cigarettes. He has never used smokeless tobacco. He reports that he does not drink alcohol and does not use drugs.   Family History:  His Family history is unknown by patient.   Allergies No Known Allergies   Home Medications  Prior to Admission medications   Medication Sig Start Date End Date Taking? Authorizing Provider  alfuzosin  (UROXATRAL ) 10 MG 24 hr tablet Take 10 mg by mouth daily after breakfast.  03/30/20  Yes [provider]  ALLERGY RELIEF 10 MG tablet Take 10 mg by mouth daily.   Yes [provider]  amLODipine  (NORVASC ) 10 MG tablet Take 10 mg by mouth daily.  05/22/19  Yes [provider]  aspirin  EC 81 MG tablet Take 81 mg by mouth daily.   Yes [provider]  atorvastatin  (LIPITOR) 20 MG tablet Take 20 mg by mouth daily.  02/28/18  Yes [provider]  benztropine  (COGENTIN ) 0.5 MG tablet Take 0.5 mg by mouth daily.    Yes [provider]  carvedilol  (COREG ) 6.25 MG tablet Take 1 tablet (6.25 mg total) by mouth 2 (two) times daily with a meal. 01/03/23  Yes Jillian Buttery, MD  ferrous sulfate  325 (65 FE) MG tablet Take 325 mg by mouth every Monday, Wednesday, and Friday.  03/27/18  Yes [provider]  ketoconazole (NIZORAL) 2 % shampoo Apply 1 Application topically 2 (two) times a week.   Yes [provider]  lisinopril-hydrochlorothiazide (ZESTORETIC) 20-25 MG  tablet Take by mouth.   Yes [provider]  metFORMIN (GLUCOPHAGE) 500 MG tablet Take 500 mg by mouth 2 (two) times daily.  01/20/15  Yes [provider]  potassium chloride  (KLOR-CON ) 10 MEQ tablet Take 10 mEq by mouth daily.   Yes [provider]  risperiDONE  (RISPERDAL ) 0.5 MG tablet Take 0.5 mg by mouth daily.   Yes [provider]  risperiDONE  (RISPERDAL ) 3 MG tablet Take 3 mg by mouth at bedtime. 05/16/19  Yes [provider]  sertraline  (ZOLOFT ) 50 MG tablet Take 50 mg by mouth daily.   Yes [provider]  potassium chloride  SA (KLOR-CON  M) 20 MEQ tablet Take 2 tablets (40  mEq total) by mouth daily for 5 days. 01/04/23 01/09/23  Jillian Buttery, MD     Critical care time: N/A

## 2024-03-09 NOTE — Progress Notes (Signed)
 Reviewed echocardiogram side-by-side with prior, progressive RV diastolic collapse with similar sized effusion.  The patient remains overall clinically stable, progressive early tamponade physiology suggests appropriate timing of pericardiocentesis.  I have spoken with the Cath Lab STEMI team who will accommodate patient today for pericardiocentesis.  I spoken with the patient's power of attorney [name] (sister).  And discussed risks and benefits of pericardiocentesis.  We discussed the patient's developmental delay and discussed that if he does not tolerate sedation well we will not proceed, however if well sedated with fentanyl and Versed, effusion should be accessible and favorable for pericardiocentesis.  Informed Consent   Shared Decision Making/Informed Consent The risks [bleeding, infection, cardiac arrest, damage to the heart or blood vessels requiring surgery, pneumothorax requiring chest tube placement, arrhythmias, changes in blood pressure, organ injury, and rarely death], benefits [therapeutic relief of fluid collection around the heart, diagnostic support], and alternatives of a pericardiocentesis were discussed in detail with Mr. Fred [name] and she agrees to proceed.

## 2024-03-09 NOTE — Plan of Care (Signed)
  Problem: Clinical Measurements: Goal: Ability to maintain clinical measurements within normal limits will improve Outcome: Progressing Goal: Diagnostic test results will improve Outcome: Progressing   Problem: Coping: Goal: Level of anxiety will decrease Outcome: Progressing   Problem: Elimination: Goal: Will not experience complications related to urinary retention Outcome: Progressing   Problem: Pain Managment: Goal: General experience of comfort will improve and/or be controlled Outcome: Progressing

## 2024-03-09 NOTE — Interval H&P Note (Signed)
 History and Physical Interval Note:  03/09/2024 11:52 AM  Fred [name]  has presented today for surgery, with the diagnosis of pericardial effusion.  The various methods of treatment have been discussed with the patient and family. After consideration of risks, benefits and other options for treatment, the patient has consented to  Procedure(s): PERICARDIOCENTESIS (N/A) as a surgical intervention.  The patient's history has been reviewed, patient examined, no change in status, stable for surgery.  I have reviewed the patient's chart and labs.  Questions were answered to the patient's satisfaction.     Cregg Jutte [facility] dermatitis 11:52 AM

## 2024-03-09 NOTE — H&P (View-Only) (Signed)
 Reviewed echocardiogram side-by-side with prior, progressive RV diastolic collapse with similar sized effusion.  The patient remains overall clinically stable, progressive early tamponade physiology suggests appropriate timing of pericardiocentesis.  I have spoken with the Cath Lab STEMI team who will accommodate patient today for pericardiocentesis.  I spoken with the patient's power of attorney [name] (sister).  And discussed risks and benefits of pericardiocentesis.  We discussed the patient's developmental delay and discussed that if he does not tolerate sedation well we will not proceed, however if well sedated with fentanyl and Versed, effusion should be accessible and favorable for pericardiocentesis.  Informed Consent   Shared Decision Making/Informed Consent The risks [bleeding, infection, cardiac arrest, damage to the heart or blood vessels requiring surgery, pneumothorax requiring chest tube placement, arrhythmias, changes in blood pressure, organ injury, and rarely death], benefits [therapeutic relief of fluid collection around the heart, diagnostic support], and alternatives of a pericardiocentesis were discussed in detail with Mr. Fred [name] and she agrees to proceed.

## 2024-03-09 NOTE — Progress Notes (Signed)
 Echocardiogram 2D Echocardiogram has been performed.  Hadlyn Amero N Moneisha Vosler,RDCS 03/09/2024, 9:41 AM

## 2024-03-09 NOTE — Progress Notes (Cosign Needed Addendum)
 HD#1 SUBJECTIVE:  Patient Summary: Fred [name] is a 73 y.o. with a pertinent PMH of Intellectual disability, DMII, HTN, and BPH, who presented with a pericardial effusion transferred from his cardiologist with concerns for tamponade and admitted for observation and further workup.   Overnight Events: None  Interim History:  Patient evaluated at bedside. Alert and sitting up with his playing cards. Denies chest pain or other pains. Denies SOB. Denies any other concerns.   OBJECTIVE:  Vital Signs: Vitals:   03/08/24 1601 03/08/24 1954 03/08/24 2342 03/09/24 0341  BP: 139/88 (!) 151/98 (!) 113/59 115/67  Pulse: (!) 104 71 (!) 41 75  Resp:  20 18 19   Temp: 97.7 F (36.5 C) (!) 97.5 F (36.4 C) 97.6 F (36.4 C) 98.7 F (37.1 C)  TempSrc: Oral Oral Oral Oral  SpO2: 100% 94% 97% 97%  Weight:      Height:       SpO2: 97 %  Filed Weights   03/07/24 1619  Weight: 58.4 kg     Intake/Output Summary (Last 24 hours) at 03/09/2024 9370 Last data filed at 03/09/2024 0351 Gross per 24 hour  Intake 240 ml  Output 1350 ml  Net -1110 ml   Net IO Since Admission: -1,110 mL [03/09/24 0629]  Physical Exam: Physical Exam Constitutional:      General: He is not in acute distress.    Appearance: He is not ill-appearing.  Cardiovascular:     Rate and Rhythm: Normal rate and regular rhythm.     Heart sounds:     No friction rub.     Comments: Heart sounds distant Pulmonary:     Effort: Pulmonary effort is normal.     Breath sounds: Normal breath sounds.  Musculoskeletal:     Right lower leg: Edema present.     Left lower leg: Edema present.     Comments: Bilateral LE edema has improved, trace at this time.  Neurological:     Mental Status: He is alert. Mental status is at baseline.      Patient Lines/Drains/Airways Status     Active Line/Drains/Airways     Name Placement date Placement time Site Days   Peripheral IV 03/07/24 20 G Left Antecubital 03/07/24  1851   Antecubital  1            Pertinent labs and imaging:      Latest Ref Rng & Units 03/08/2024    5:06 AM 03/07/2024    3:02 PM 01/03/2024    1:26 PM  CBC  WBC 4.0 - 10.5 K/uL 6.0  6.6    Hemoglobin 13.0 - 17.0 g/dL 89.2  89.7  9.5   Hematocrit 39.0 - 52.0 % 34.3  32.7  28.0   Platelets 150 - 400 K/uL 191  199         Latest Ref Rng & Units 03/09/2024    4:18 AM 03/08/2024    5:06 AM 03/07/2024    3:02 PM  CMP  Glucose 70 - 99 mg/dL 86  99  860   BUN 8 - 23 mg/dL 20  28  36   Creatinine 0.61 - 1.24 mg/dL 8.87  8.58  8.12   Sodium 135 - 145 mmol/L 141  142  140   Potassium 3.5 - 5.1 mmol/L 3.5  3.8  4.4   Chloride 98 - 111 mmol/L 110  108  108   CO2 22 - 32 mmol/L 26  23  19    Calcium  8.9 -  10.3 mg/dL 8.7  9.2  9.0   Total Protein 6.5 - 8.1 g/dL   6.8   Total Bilirubin 0.0 - 1.2 mg/dL   0.5   Alkaline Phos 38 - 126 U/L   56   AST 15 - 41 U/L   23   ALT 0 - 44 U/L   20     Latest Reference Range & Units 03/07/24 16:30  TSH 0.350 - 4.500 uIU/mL 1.045    No results found.   ASSESSMENT/PLAN:  Assessment: Principal Problem:   Pericardial effusion Active Problems:   Anemia   BPH with urinary obstruction   Hypertension associated with diabetes (HCC)   Intellectual disability   Type 2 diabetes mellitus without complication, without long-term current use of insulin  (HCC)  Fred [name] is a 73 y.o. person living with a history of Intellectual disability, HTN, DMII, and BPH who presented with leg swelling and admitted for concern for cardiac tamponade. Echocardiogram findings were reassuring that there was pericardial effusion without tamponade.   Plan: #Pericardial effusion without tamponade Last echo without signs of tamponade. Patient remains HDS and no acute symptoms. Cardiology spoke with POA and given patient unable to remain still, deferred pericardiocentesis given risks. Normal trop and TSH. Normal CRP but mild elevation of ESR, to assess for pericarditis. Does not  appear signs of pericarditis on EKG. -Appreciate cardiology assistance -Repeat echo today to assess interval changes  -If becomes hypotensive or sustained tachycardia, contact cardiology for re-evaluation for tamponade -Potential discharge if repeat echo without acute changes with outpatient cardiology f/u   #AKI, resolved Scr 1.87 on admission. BL of 1-1.2. Received small IVF bolus on admission. AKI resolved today with Scr 1.12. suspect pre-renal etiology that responded to IVF.  -Encourage oral hydration   #T2DM, controlled On metformin 500mg  BID outpatient. Last A1C 5.5. Fasting glucose 86 today.  -Can resume home metformin at discharge  -Continue home Atorvastatin  20mg  daily   Stable chronic conditions #HTN  BP on admission 108/62 after taking morning meds. BP 115/67 this morning. -Holding home Lisinopril-Hydrochlorothiazide 20-25 mg x 2 tablets daily, Coreg  12.5mg  daily, Amlodipine  10mg  daily in the setting of low-normal readings and large pericardial effusion with mild collapse. -May need to restart gradually given low-normal BP and ensure PCP f/u    #IDD Intellectual disability without any combative behaviors, able to perform most ADLs on his own but does not speak in long sentences. -Continue home medications of risperidone  0.5 mg in AM and 3 mg at bedtime and benztropine  0.5 mg daily -Sertraline  50mg  daily   #Allergies  -loratadine  10 mg daily    #BPH S/P Turp procedure 5/24 -alfuzosin  10 mg daily   Best Practice: Diet: Dys 2 diet VTE: enoxaparin  (LOVENOX ) injection 40 mg Start: 03/09/24 2200 Code: DNR/DNI  Disposition planning: Family Contact: [name] Daring (sister and MPOA) DISPO: Anticipated discharge today to Home pending repeat echo and cardiology recs.  Signature: [name]  6:29 AM, 03/09/2024  On Call pager [phone]

## 2024-03-09 NOTE — Progress Notes (Signed)
  Progress Note  Patient Name: Fred [name] Date of Encounter: 03/09/2024 [facility] Cardiologist: None   Interval Summary   No complaints but seen without family or caregiver at bedside. Patient sitting up right breathing comfortably, no distress, eating his breakfast, mildly tachycardic likely due to movement heart rate normalizes quickly when he pauses.  Blood pressure remains mildly elevated, reassuring from the standpoint of tamponade.  Vital Signs Vitals:   03/08/24 1954 03/08/24 2342 03/09/24 0341 03/09/24 0739  BP: (!) 151/98 (!) 113/59 115/67   [name]: 71 (!) 41 75   Resp: 20 18 19 18   Temp: (!) 97.5 F (36.4 C) 97.6 F (36.4 C) 98.7 F (37.1 C) 97.8 F (36.6 C)  TempSrc: Oral Oral Oral Oral  SpO2: 94% 97% 97%   Weight:      Height:        Intake/Output Summary (Last 24 hours) at 03/09/2024 0901 Last data filed at 03/09/2024 0641 Gross per 24 hour  Intake 240 ml  Output 2050 ml  Net -1810 ml      03/07/2024    4:19 PM 11/21/2022    5:00 AM 11/20/2022    5:00 AM  Last 3 Weights  Weight (lbs) 128 lb 12 oz 128 lb 12 oz 129 lb 3 oz  Weight (kg) 58.4 kg 58.4 kg 58.6 kg      Telemetry/ECG  SR - Personally Reviewed  Physical Exam  GEN: No acute distress.   Neck: No JVD Cardiac: RRR, no murmurs, rubs, or gallops.  Respiratory: Clear to auscultation bilaterally. GI: Soft, nontender, non-distended  MS: No edema  Assessment & Plan  #Large pericardial effusion - clinically no tamponade. TSH normal.  - clinically stable, and given concerns of his ability to follow commands for procedure, we deferred on pericardiocentesis after conversation with POA.  Could possibly be followed up closely as outpatient with serial echos. My concern is his ability to follow commands and lay still and flat for procedure.  -He is pending limited echo today to reevaluate pericardial effusion, we will make further decisions about procedural management after echo reviewed. -ESR  normal, sed rate mildly elevated.  Query whether he may have had a subacute pericarditis in the last few months with slowly accumulating effusion, otherwise etiology is unknown.  #AKI - responded to fluids. Likely prerenal.  Renal function has now normalized.  Patient may be quite sensitive to dehydration with his pericardial effusion, therefore recommend maintaining euvolemia and avoiding dehydration.    For questions or updates, please contact [facility] Please consult [email or web address] for contact info under       Signed, Kourtney Terriquez A Naylah Cork, MD

## 2024-03-10 ENCOUNTER — Encounter (HOSPITAL_COMMUNITY): Payer: Self-pay | Admitting: Cardiology

## 2024-03-10 DIAGNOSIS — I509 Heart failure, unspecified: Secondary | ICD-10-CM | POA: Diagnosis not present

## 2024-03-10 DIAGNOSIS — I3139 Other pericardial effusion (noninflammatory): Secondary | ICD-10-CM | POA: Diagnosis not present

## 2024-03-10 LAB — CBC
HCT: 35.8 % — ABNORMAL LOW (ref 39.0–52.0)
Hemoglobin: 11.3 g/dL — ABNORMAL LOW (ref 13.0–17.0)
MCH: 29.4 pg (ref 26.0–34.0)
MCHC: 31.6 g/dL (ref 30.0–36.0)
MCV: 93.2 fL (ref 80.0–100.0)
Platelets: 173 K/uL (ref 150–400)
RBC: 3.84 MIL/uL — ABNORMAL LOW (ref 4.22–5.81)
RDW: 15 % (ref 11.5–15.5)
WBC: 10 K/uL (ref 4.0–10.5)
nRBC: 0 % (ref 0.0–0.2)

## 2024-03-10 LAB — LD, BODY FLUID (OTHER): LD, Body Fluid: 146 IU/L

## 2024-03-10 LAB — BASIC METABOLIC PANEL WITH GFR
Anion gap: 9 (ref 5–15)
BUN: 22 mg/dL (ref 8–23)
CO2: 24 mmol/L (ref 22–32)
Calcium: 8.9 mg/dL (ref 8.9–10.3)
Chloride: 107 mmol/L (ref 98–111)
Creatinine, Ser: 1.3 mg/dL — ABNORMAL HIGH (ref 0.61–1.24)
GFR, Estimated: 58 mL/min — ABNORMAL LOW (ref >60)
Glucose, Bld: 76 mg/dL (ref 70–99)
Potassium: 3.6 mmol/L (ref 3.5–5.1)
Sodium: 140 mmol/L (ref 135–145)

## 2024-03-10 LAB — PROTEIN, BODY FLUID (OTHER): Total Protein, Body Fluid Other: 4.3 g/dL

## 2024-03-10 LAB — GLUCOSE, BODY FLUID OTHER: Glucose, Body Fluid Other: 119 mg/dL

## 2024-03-10 LAB — GLUCOSE, CAPILLARY
Glucose-Capillary: 143 mg/dL — ABNORMAL HIGH (ref 70–99)
Glucose-Capillary: 263 mg/dL — ABNORMAL HIGH (ref 70–99)
Glucose-Capillary: 75 mg/dL (ref 70–99)
Glucose-Capillary: 111 mg/dL — ABNORMAL HIGH (ref 70–99)

## 2024-03-10 LAB — PHOSPHORUS: Phosphorus: 2.8 mg/dL (ref 2.5–4.6)

## 2024-03-10 LAB — MAGNESIUM: Magnesium: 2 mg/dL (ref 1.7–2.4)

## 2024-03-10 NOTE — Progress Notes (Signed)
   NAME:  Fred [name], MRN:  [id number], DOB:  [birth year], LOS: 2 ADMISSION DATE:  03/07/2024, CONSULTATION DATE:  03/09/24 REFERRING MD:  BERNIS, CHIEF COMPLAINT:  leg swelling   History of Present Illness:  73 year old man w/ hx of cognitive delay, DM2, HTN, BPH p/w leg swelling found to have large pericardial effusion with some tamponade features.  Monitored conservatively overnight with repeat echo continuing to show some concerning signs so sent to cath lab for definitive management.  650cc serous fluid removed and drain left in place, patient moved to ICU.  PCCM to take over for IMTS while in ICU.  Patient pleasant and would like food.  Little insight into condition.  Pertinent  Medical History   Past Medical History:  Diagnosis Date   Diabetes mellitus    Hypertension    Mental retardation      Significant Hospital Events: Including procedures, antibiotic start and stop dates in addition to other pertinent events   7/10 admit 7/12 pericardial drain  Interim History / Subjective:  No events, denies pain. Ongoing fair amount of pericardial output.  Objective    Blood pressure 123/75, pulse 100, temperature 99 F (37.2 C), temperature source Axillary, resp. rate (!) 21, height 5' 8 (1.727 m), weight 58.4 kg, SpO2 96%.        Intake/Output Summary (Last 24 hours) at 03/10/2024 0751 Last data filed at 03/10/2024 0600 Gross per 24 hour  Intake --  Output 2600 ml  Net -2600 ml   Filed Weights   03/07/24 1619  Weight: 58.4 kg    Examination: No distress Chronically ill appearing Mumbles and intermittently says words Ext warm Serous fluid from pericardium with some fibrin strangs Moves to command +muscle wasting Trace edema  Labs pending CBG ok  Resolved problem list   Assessment and Plan  Decompensated heart failure due to threatened obstructive shock from pericardial effusion + tamponade physiology- cause of pericardial effusion unclear, path pending; s/p  drain 03/09/24; seems improved BPH, cognitive delay, HTN DM2  - Pericardial drain and any diuretic challenge per cardiology team; f/u am labs - Dysphagia diet - ACHS CBG with SSI - Patient is DNR/DNI - PTA meds as ordered, empiric colchicine  - f/u pericardial fluid studies, still pending - Will be attending while in ICU, back to IMTS once pericardial drain out  Rolan Sharps MD PCCM

## 2024-03-10 NOTE — Progress Notes (Signed)
  Progress Note  Patient Name: Fred [name] Date of Encounter: 03/10/2024 [facility] Cardiologist: None   Interval Summary    Underwent pericardiocentesis yesterday with 650cc of fluid out. Drain placed. Started on colchicine   Drain with ~ 400cc /out post procedure.   Denies CP or SOB   Initial fluid analysis: 1,975 nucleated cells 30% lymphs. Final path pending   EF 45-50% by echo    Vital Signs Vitals:   03/10/24 0400 03/10/24 0500 03/10/24 0700 03/10/24 0800  BP: 113/82 123/75 98/67 117/76  Pulse: 100 100 97 100  Resp: 17 (!) 21 17 15   Temp:    (!) 97.5 F (36.4 C)  TempSrc:    Oral  SpO2: 95% 96% 95% 96%  Weight:      Height:        Intake/Output Summary (Last 24 hours) at 03/10/2024 0848 Last data filed at 03/10/2024 0600 Gross per 24 hour  Intake --  Output 2600 ml  Net -2600 ml      03/07/2024    4:19 PM 11/21/2022    5:00 AM 11/20/2022    5:00 AM  Last 3 Weights  Weight (lbs) 128 lb 12 oz 128 lb 12 oz 129 lb 3 oz  Weight (kg) 58.4 kg 58.4 kg 58.6 kg      Telemetry/ECG  SR 90-100 Personally reviewed  Physical Exam  General:  Curled up in bed. Thin/cachetic No resp difficulty HEENT: normal Neck: supple. no JVD. Carotids 2+ bilat; no bruits. No lymphadenopathy or thryomegaly appreciated. Cor:  Regular rate & rhythm. No rub. Drain site ok  Lungs: clear Abdomen: soft, nontender, nondistended. No hepatosplenomegaly. No bruits or masses. Good bowel sounds. Extremities: no cyanosis, clubbing, rash, edema Neuro: alert. Communicative  Assessment & Plan  #Large pericardial effusion -ESR 32  Query whether he may have had a subacute pericarditis in the last few months with slowly accumulating effusion, otherwise etiology is unknown. - s/p tap 7/12 with 650cc out. Initial fluid analysis indeterminate - still draining. Will leave in. Pull when drainage < 50cc x 24hr - Continue colchicine . Await full cytology - ECHO EF 45-50%  #AKI - resolved  with IVF  #Cognitive disorder - has caretaker    For questions or updates, please contact [facility] Please consult [email or web address] for contact info under       Signed, Toribio Fuel, MD

## 2024-03-11 ENCOUNTER — Telehealth (HOSPITAL_COMMUNITY): Payer: Self-pay | Admitting: Pharmacy Technician

## 2024-03-11 ENCOUNTER — Other Ambulatory Visit (HOSPITAL_COMMUNITY): Payer: Self-pay

## 2024-03-11 DIAGNOSIS — I509 Heart failure, unspecified: Secondary | ICD-10-CM | POA: Diagnosis not present

## 2024-03-11 DIAGNOSIS — I5020 Unspecified systolic (congestive) heart failure: Secondary | ICD-10-CM

## 2024-03-11 DIAGNOSIS — I3139 Other pericardial effusion (noninflammatory): Secondary | ICD-10-CM | POA: Diagnosis not present

## 2024-03-11 LAB — CBC
HCT: 33 % — ABNORMAL LOW (ref 39.0–52.0)
Hemoglobin: 10.6 g/dL — ABNORMAL LOW (ref 13.0–17.0)
MCH: 29.4 pg (ref 26.0–34.0)
MCHC: 32.1 g/dL (ref 30.0–36.0)
MCV: 91.7 fL (ref 80.0–100.0)
Platelets: 159 K/uL (ref 150–400)
RBC: 3.6 MIL/uL — ABNORMAL LOW (ref 4.22–5.81)
RDW: 14.8 % (ref 11.5–15.5)
WBC: 8.3 K/uL (ref 4.0–10.5)
nRBC: 0 % (ref 0.0–0.2)

## 2024-03-11 LAB — BASIC METABOLIC PANEL WITH GFR
Anion gap: 8 (ref 5–15)
BUN: 24 mg/dL — ABNORMAL HIGH (ref 8–23)
CO2: 25 mmol/L (ref 22–32)
Calcium: 8.4 mg/dL — ABNORMAL LOW (ref 8.9–10.3)
Chloride: 107 mmol/L (ref 98–111)
Creatinine, Ser: 1.41 mg/dL — ABNORMAL HIGH (ref 0.61–1.24)
GFR, Estimated: 52 mL/min — ABNORMAL LOW (ref >60)
Glucose, Bld: 102 mg/dL — ABNORMAL HIGH (ref 70–99)
Potassium: 3.8 mmol/L (ref 3.5–5.1)
Sodium: 140 mmol/L (ref 135–145)

## 2024-03-11 LAB — GLUCOSE, CAPILLARY
Glucose-Capillary: 183 mg/dL — ABNORMAL HIGH (ref 70–99)
Glucose-Capillary: 75 mg/dL (ref 70–99)
Glucose-Capillary: 86 mg/dL (ref 70–99)

## 2024-03-11 LAB — PHOSPHORUS: Phosphorus: 2.6 mg/dL (ref 2.5–4.6)

## 2024-03-11 LAB — MAGNESIUM: Magnesium: 2.1 mg/dL (ref 1.7–2.4)

## 2024-03-11 NOTE — Progress Notes (Signed)
 Advanced Heart Failure Rounding Note  Cardiologist: None  Chief Complaint: Pericardial Effusion   Patient Profile   73 y.o. male with a hx of hypertension, hyperlipidemia, DM2, and cognitive disorder, admitted w/ large pericardial effusion.    Subjective:    7/12: Underwent pericardiocentesis yesterday with 650cc of fluid out. Drain placed. Started on colchicine    C/w significant drain output, 660 cc out yesterday. Hgb stable at 10.6. BP normotensive. Cytology still pending. No current complaints. Denies dyspnea.    Objective:   Weight Range: 58.4 kg Body mass index is 19.58 kg/m.   Vital Signs:   Temp:  [97.5 F (36.4 C)-98.6 F (37 C)] 98.6 F (37 C) (07/13 1600) Pulse Rate:  [84-116] 92 (07/14 0600) Resp:  [7-26] 26 (07/14 0600) BP: (92-141)/(63-87) 107/67 (07/14 0600) SpO2:  [72 %-100 %] 100 % (07/14 0600) Last BM Date :  (PTA)  Weight change: Filed Weights   03/07/24 1619  Weight: 58.4 kg    Intake/Output:   Intake/Output Summary (Last 24 hours) at 03/11/2024 0755 Last data filed at 03/11/2024 0400 Gross per 24 hour  Intake 240 ml  Output 2510 ml  Net -2270 ml      Physical Exam    General:  chronically ill appearing, elderly male. No resp difficulty HEENT: Normal Neck: Supple. JVP not elevated . Carotids 2+ bilat; no bruits. No lymphadenopathy or thyromegaly appreciated. Cor: PMI nondisplaced. Regular rhythm, tachy rate. No rubs, gallops or murmurs. + subxiphoid pericardial drain  Lungs: Clear Abdomen: Soft, nontender, nondistended. No hepatosplenomegaly. No bruits or masses. Good bowel sounds. Extremities: No cyanosis, clubbing, rash, edema Neuro: Alert & orientedx3, cranial nerves grossly intact. moves all 4 extremities w/o difficulty. Affect pleasant   Telemetry   NSR 90s-low 100s, personally reviewed   EKG    Sinus tach w/ PACs 102 bpm, personally reviewed   Labs    CBC Recent Labs    03/10/24 1042 03/11/24 0230  WBC 10.0 8.3   HGB 11.3* 10.6*  HCT 35.8* 33.0*  MCV 93.2 91.7  PLT 173 159   Basic Metabolic Panel Recent Labs    92/86/74 1042 03/11/24 0230  NA 140 140  K 3.6 3.8  CL 107 107  CO2 24 25  GLUCOSE 76 102*  BUN 22 24*  CREATININE 1.30* 1.41*  CALCIUM  8.9 8.4*  MG 2.0 2.1  PHOS 2.8 2.6   Liver Function Tests No results for input(s): AST, ALT, ALKPHOS, BILITOT, PROT, ALBUMIN in the last 72 hours. No results for input(s): LIPASE, AMYLASE in the last 72 hours. Cardiac Enzymes No results for input(s): CKTOTAL, CKMB, CKMBINDEX, TROPONINI in the last 72 hours.  BNP: BNP (last 3 results) Recent Labs    03/07/24 1502  BNP 67.7    ProBNP (last 3 results) No results for input(s): PROBNP in the last 8760 hours.   D-Dimer No results for input(s): DDIMER in the last 72 hours. Hemoglobin A1C No results for input(s): HGBA1C in the last 72 hours. Fasting Lipid Panel No results for input(s): CHOL, HDL, LDLCALC, TRIG, CHOLHDL, LDLDIRECT in the last 72 hours. Thyroid Function Tests No results for input(s): TSH, T4TOTAL, T3FREE, THYROIDAB in the last 72 hours.  Invalid input(s): FREET3  Other results:   Imaging    No results found.   Medications:     Scheduled Medications:  alfuzosin   10 mg Oral QPC breakfast   atorvastatin   20 mg Oral Daily   benztropine   0.5 mg Oral Daily   Chlorhexidine   Gluconate Cloth  6 each Topical Daily   colchicine   0.6 mg Oral Daily   enoxaparin  (LOVENOX ) injection  40 mg Subcutaneous Q24H   insulin  aspart  0-15 Units Subcutaneous TID WC   loratadine   10 mg Oral Daily   risperiDONE   0.5 mg Oral Daily   And   risperiDONE   3 mg Oral QHS   sertraline   50 mg Oral Daily    Infusions:   PRN Medications: acetaminophen  **OR** acetaminophen , senna-docusate    Assessment/Plan   Assessment & Plan  1. Large pericardial effusion -ESR 32  Query whether he may have had a subacute pericarditis in the  last few months with slowly accumulating effusion, otherwise etiology is unknown. - s/p tap 7/12 with 650cc out. Initial fluid analysis indeterminate - still draining. 660 cc out yesterday. Pull when drainage < 50cc x 24hr - Continue colchicine . Await full cytology - Limited ECHO 7/12 EF 45-50% - will plan repeat bedside echo today to reassess for any recurrence   2. HFrEF - Echo 7/12 w/ mildly reduced EF 45-50%  - volume status currently ok - w/ rising SCr and soft BP, will hold off of starting GDMT for now     3. AKI - resolved initially with IVF, however SCr trending back up again, 1.12>>1.30>>1.40 today  - follow BMP    4. Cognitive disorder - has caretaker   Length of Stay: [address] Marcine, PA-C  03/11/2024, 7:55 AM  Advanced Heart Failure Team Pager [phone] (M-F; 7a - 5p)  Please contact [facility] for night-coverage after hours (5p -7a ) and weekends on [email or web address]

## 2024-03-11 NOTE — Progress Notes (Signed)
   NAME:  Fred [name], MRN:  [id number], DOB:  20-Sep-1950, LOS: 3 ADMISSION DATE:  03/07/2024, CONSULTATION DATE:  03/09/24 REFERRING MD:  BERNIS, CHIEF COMPLAINT:  leg swelling   History of Present Illness:  72 year old man w/ hx of cognitive delay, DM2, HTN, BPH p/w leg swelling found to have large pericardial effusion with some tamponade features.  Monitored conservatively overnight with repeat echo continuing to show some concerning signs so sent to cath lab for definitive management.  650cc serous fluid removed and drain left in place, patient moved to ICU.  PCCM to take over for IMTS while in ICU.  Patient pleasant and would like food.  Little insight into condition.  Pertinent  Medical History   Past Medical History:  Diagnosis Date   Diabetes mellitus    Hypertension    Mental retardation      Significant Hospital Events: Including procedures, antibiotic start and stop dates in addition to other pertinent events   7/10 admit 7/12 pericardial drain  Interim History / Subjective:  No events.  Denies any pain or breathing problems.  Objective    Blood pressure 109/79, pulse 88, temperature 98.6 F (37 C), temperature source Oral, resp. rate 14, height 5' 8 (1.727 m), weight 58.4 kg, SpO2 96%.        Intake/Output Summary (Last 24 hours) at 03/11/2024 9090 Last data filed at 03/11/2024 0400 Gross per 24 hour  Intake 240 ml  Output 2350 ml  Net -2110 ml   Filed Weights   03/07/24 1619  Weight: 58.4 kg    Examination: No distress Similar garbled speech pattern, muscle wasting Serous fluid from pericardial drain RASS 0 Abd soft Ext warm, heart irregular  Stable CKD  Resolved problem list   Assessment and Plan  Decompensated heart failure due to threatened obstructive shock from pericardial effusion + tamponade physiology- cause of pericardial effusion unclear, path pending; s/p drain 03/09/24; seems improved; ongoing high ouptut from pericardial drain BPH,  cognitive delay, HTN DM2 CKD  - Pericardial drain and GDMT per cardiology team - Dysphagia diet - ACHS CBG with SSI - Patient is DNR/DNI - PTA meds as ordered, empiric colchicine  - f/u pericardial fluid  cyto, still pending - Will be attending while in ICU, back to IMTS once pericardial drain out  Rolan Sharps MD PCCM

## 2024-03-11 NOTE — Telephone Encounter (Signed)
 Patient Product/process development scientist completed.    The patient is insured through [employer]. Patient has Medicare and is not eligible for a copay card, but may be able to apply for patient assistance or Medicare RX Payment Plan (Patient Must reach out to their plan, if eligible for payment plan), if available.    Ran test claim for colchicine  0.6 mg tablet and the current 30 day co-pay is $0.00.   This test claim was processed through [facility]- copay amounts may vary at other pharmacies due to pharmacy/plan contracts, or as the patient moves through the different stages of their insurance plan.     Fred [name], CPHT Pharmacy Technician III Certified Patient Advocate [facility] Patient Advocate Team Direct Number: [phone]  Fax: [phone]

## 2024-03-12 ENCOUNTER — Inpatient Hospital Stay (HOSPITAL_COMMUNITY)

## 2024-03-12 DIAGNOSIS — I509 Heart failure, unspecified: Secondary | ICD-10-CM | POA: Diagnosis not present

## 2024-03-12 DIAGNOSIS — I3139 Other pericardial effusion (noninflammatory): Secondary | ICD-10-CM

## 2024-03-12 LAB — CBC
HCT: 32.6 % — ABNORMAL LOW (ref 39.0–52.0)
Hemoglobin: 10.3 g/dL — ABNORMAL LOW (ref 13.0–17.0)
MCH: 29.7 pg (ref 26.0–34.0)
MCHC: 31.6 g/dL (ref 30.0–36.0)
MCV: 93.9 fL (ref 80.0–100.0)
Platelets: 154 K/uL (ref 150–400)
RBC: 3.47 MIL/uL — ABNORMAL LOW (ref 4.22–5.81)
RDW: 14.8 % (ref 11.5–15.5)
WBC: 6.3 K/uL (ref 4.0–10.5)
nRBC: 0 % (ref 0.0–0.2)

## 2024-03-12 LAB — ECHOCARDIOGRAM LIMITED
Height: 68 in
S' Lateral: 3.1 cm
Weight: 2059.98 [oz_av]

## 2024-03-12 LAB — MAGNESIUM: Magnesium: 2.1 mg/dL (ref 1.7–2.4)

## 2024-03-12 LAB — BASIC METABOLIC PANEL WITH GFR
Anion gap: 9 (ref 5–15)
BUN: 22 mg/dL (ref 8–23)
CO2: 25 mmol/L (ref 22–32)
Calcium: 8.3 mg/dL — ABNORMAL LOW (ref 8.9–10.3)
Chloride: 103 mmol/L (ref 98–111)
Creatinine, Ser: 1.28 mg/dL — ABNORMAL HIGH (ref 0.61–1.24)
GFR, Estimated: 59 mL/min — ABNORMAL LOW (ref >60)
Glucose, Bld: 243 mg/dL — ABNORMAL HIGH (ref 70–99)
Potassium: 3.8 mmol/L (ref 3.5–5.1)
Sodium: 137 mmol/L (ref 135–145)

## 2024-03-12 LAB — GLUCOSE, CAPILLARY
Glucose-Capillary: 121 mg/dL — ABNORMAL HIGH (ref 70–99)
Glucose-Capillary: 188 mg/dL — ABNORMAL HIGH (ref 70–99)
Glucose-Capillary: 75 mg/dL (ref 70–99)
Glucose-Capillary: 274 mg/dL — ABNORMAL HIGH (ref 70–99)

## 2024-03-12 LAB — PHOSPHORUS: Phosphorus: 2.5 mg/dL (ref 2.5–4.6)

## 2024-03-12 LAB — CYTOLOGY - NON PAP

## 2024-03-12 MED ORDER — POTASSIUM CHLORIDE CRYS ER 20 MEQ PO TBCR
20.0000 meq | EXTENDED_RELEASE_TABLET | Freq: Once | ORAL | Status: AC
  Administered 2024-03-12: 20 meq via ORAL
  Filled 2024-03-12: qty 1

## 2024-03-12 NOTE — TOC Initial Note (Signed)
 Transition of Care [facility]) - Initial/Assessment Note    Patient Details  Name: Fred [name] MRN: [id number] Date of Birth: May 23, 1951  Transition of Care [facility]) CM/SW Contact:    Lauraine FORBES Saa, LCSW Phone Number: 03/12/2024, 4:27 PM  Clinical Narrative:                  4:27 PM CSW introduced self and role to patient's sister, [name] (patient is not currently fully oriented). [name] confirmed patient resides at home with two brothers, all whom receives 24/7 PCS through [facility]. [name] confirmed patient HH history with [facility] and [facility]. [name] confirmed patient DME (shower stool, rolling walker) history but stated that patient has not needed to use rolling walker prior to current hospitalization. Cherly confirmed patient does not have SNF history. Per chart review, patient has a PCP and insurance. Patient's listed preferred pharmacy's are [facility] and [address] [facility] [place].    Expected Discharge Plan: Home/Self Care Barriers to Discharge: Continued Medical Work up   Patient Goals and CMS Choice            Expected Discharge Plan and Services       Living arrangements for the past 2 months: Single Family Home                                      Prior Living Arrangements/Services Living arrangements for the past 2 months: Single Family Home Lives with:: Siblings Patient language and need for interpreter reviewed:: Yes        Need for Family Participation in Patient Care: Yes (Comment) Care giver support system in place?: Yes (comment) Current home services: DME, Other (comment) (PCS) Criminal Activity/Legal Involvement Pertinent to Current Situation/Hospitalization: No - Comment as needed  Activities of Daily Living   ADL Screening (condition at time of admission) Independently performs ADLs?: No Does the patient have a NEW difficulty with bathing/dressing/toileting/self-feeding that is expected to last  >3 days?: No Does the patient have a NEW difficulty with getting in/out of bed, walking, or climbing stairs that is expected to last >3 days?: No Does the patient have a NEW difficulty with communication that is expected to last >3 days?: No Is the patient deaf or have difficulty hearing?: No Does the patient have difficulty seeing, even when wearing glasses/contacts?: No Does the patient have difficulty concentrating, remembering, or making decisions?: Yes  Permission Sought/Granted Permission sought to share information with : Family Supports, Oceanographer granted to share information with : No (Contact information on chart)  Share Information with NAME: [name] JONETTA Daring  Permission granted to share info w AGENCY: [facility]  Permission granted to share info w Relationship: Sister  Permission granted to share info w Contact Information: [phone]  Emotional Assessment Appearance:: Appears stated age Attitude/Demeanor/Rapport: Unable to Assess Affect (typically observed): Unable to Assess Orientation: : Oriented to Self Alcohol / Substance Use: Not Applicable Psych Involvement: No (comment)  Admission diagnosis:  Pericardial effusion [I31.39] Peripheral edema [R60.0] AKI (acute kidney injury) (HCC) [N17.9] Pericardial effusion with cardiac tamponade [I31.39, I31.4] Patient Active Problem List   Diagnosis Date Noted   Pericardial effusion 01/23/2024   Irregular heart beat 01/23/2024   Contusion of toenail of right foot 02/01/2023   Acute UTI (urinary tract infection) 01/01/2023   Acute cystitis without hematuria 11/02/2022   Acute metabolic encephalopathy 11/02/2022  Stage 3a chronic kidney disease (CKD) (HCC) - baseline SCr 1.3 11/02/2022   Hypokalemia 11/02/2022   DNR (do not resuscitate)/DNI(Do Not Intubate) 11/02/2022   Severe sepsis (HCC) 11/02/2022   AKI (acute kidney injury) (HCC) 11/02/2022   Xerosis of skin 12/29/2021    Pain due to onychomycosis of toenails of both feet 02/08/2019   Aortic ectasia, abdominal (HCC) 04/23/2018   Anemia 08/06/2017   Elevated prostate specific antigen (PSA) 06/29/2014   Incomplete emptying of bladder 06/29/2014   Urinary incontinence 06/29/2014   BPH with urinary obstruction 06/27/2014   Hypertension associated with diabetes (HCC) 09/19/2012   Intellectual disability 09/19/2012   Type 2 diabetes mellitus without complication, without long-term current use of insulin  (HCC) 09/19/2012   PCP:  Pura Lenis, MD Pharmacy:   [facility] - [place], [place] - [address] [place] [address] Phone: [phone] Fax: [phone]  [facility] - [place], [place] - [address] [place] [address] Phone: [phone] Fax: [phone]     Social Drivers of Health (SDOH) Social History: SDOH Screenings   Food Insecurity: Patient Unable To Answer (03/08/2024)  Housing: Unknown (03/08/2024)  Transportation Needs: Patient Unable To Answer (03/08/2024)  Utilities: Patient Unable To Answer (03/08/2024)  Financial Resource Strain: Low Risk  (04/26/2023)   Received from [facility]  Physical Activity: Unknown (04/26/2023)   Received from [facility]  Social Connections: Patient Unable To Answer (03/08/2024)  Stress: No Stress Concern Present (04/26/2023)   Received from [facility]  Tobacco Use: Medium Risk (03/07/2024)   Received from [facility]   SDOH Interventions:     Readmission Risk Interventions     No data to display

## 2024-03-12 NOTE — Plan of Care (Signed)
  Problem: Pain Managment: Goal: General experience of comfort will improve and/or be controlled Outcome: Progressing   Problem: Safety: Goal: Ability to remain free from injury will improve Outcome: Progressing   Problem: Skin Integrity: Goal: Risk for impaired skin integrity will decrease Outcome: Progressing

## 2024-03-12 NOTE — Progress Notes (Signed)
 Echocardiogram 2D Echocardiogram has been performed.  Thea Norlander 03/12/2024, 9:25 AM

## 2024-03-12 NOTE — Progress Notes (Signed)
   NAME:  Fred [name], MRN:  [id number], DOB:  Feb 09, 1951, LOS: 4 ADMISSION DATE:  03/07/2024, CONSULTATION DATE:  03/09/24 REFERRING MD:  BERNIS, CHIEF COMPLAINT:  leg swelling   History of Present Illness:  73 year old man w/ hx of cognitive delay, DM2, HTN, BPH p/w leg swelling found to have large pericardial effusion with some tamponade features.  Monitored conservatively overnight with repeat echo continuing to show some concerning signs so sent to cath lab for definitive management.  650cc serous fluid removed and drain left in place, patient moved to ICU.  PCCM to take over for IMTS while in ICU.  Patient pleasant and would like food.  Little insight into condition.  Pertinent  Medical History   Past Medical History:  Diagnosis Date   Diabetes mellitus    Hypertension    Mental retardation      Significant Hospital Events: Including procedures, antibiotic start and stop dates in addition to other pertinent events   7/10 admit 7/12 pericardial drain Echo 7/12 w/ mildly reduced EF 45-50%   Interim History / Subjective:  No events overnight  Minimal output from pericardial drain ~91ml Denies pain or SOB  Objective    Blood pressure 102/74, pulse 99, temperature 97.6 F (36.4 C), temperature source Axillary, resp. rate 18, height 5' 8 (1.727 m), weight 58.4 kg, SpO2 97%.        Intake/Output Summary (Last 24 hours) at 03/12/2024 1026 Last data filed at 03/12/2024 0900 Gross per 24 hour  Intake 240 ml  Output 2665 ml  Net -2425 ml   Filed Weights   03/07/24 1619  Weight: 58.4 kg    Examination: General:  awake, NAD in bed HEENT: MM pink/moist Neuro: awake and alert, garbled speech, at baseline with known cognitive deficits  CV: s1s2 rrr, no m/r/g, pericardial drain with serous drainage  PULM:  resps even non labored on RA, clear  GI: soft, bsx4 active  Extremities: warm/dry, no edema  Skin: no rashes or lesions    Resolved problem list   Assessment and Plan   Decompensated heart failure due to threatened obstructive shock from pericardial effusion + tamponade physiology- cause of pericardial effusion unclear, path pending; s/p drain 03/09/24; seems improved; ongoing high ouptut from pericardial drain BPH, cognitive delay, HTN DM2 AKI on CKD - improving slowly   - Pericardial drain and GDMT per cardiology team - plan to pull drain today  - Continue colchicine , fluid cytology pending  - Dysphagia diet - ACHS CBG with SSI - f/u chem  - Patient is DNR/DNI - PTA meds as ordered - cards f/u   Heart failure team signing off  Once pericardial drain pulled can tx out of ICU, back to IMTS.   Rockie Myers, NP Pulmonary/Critical Care Medicine  03/12/2024  10:26 AM   See Tracey for personal pager PCCM on call pager [phone] until 7pm. Please call Elink 7p-7a. [phone]

## 2024-03-12 NOTE — Progress Notes (Signed)
 Advanced Heart Failure Rounding Note  Cardiologist: None  Chief Complaint: Pericardial Effusion   Patient Profile   73 y.o. male with a hx of hypertension, hyperlipidemia, DM2, and cognitive disorder, admitted w/ large pericardial effusion.    Subjective:    7/12: Underwent pericardiocentesis yesterday with 650cc of fluid out. Drain placed. Started on colchicine    Drain output slowing, only 5 cc out overnight. Bedside echo done this morning shows no significant effusion.  Cytology still pending   Cognitive disorder at baseline so unable to obtain good history from pt. Appears to be doing well. BP normotensive. No signs of discomfort of distress.    Objective:   Weight Range: 58.4 kg Body mass index is 19.58 kg/m.   Vital Signs:   Temp:  [97.4 F (36.3 C)-98.1 F (36.7 C)] 97.6 F (36.4 C) (07/15 0741) Pulse Rate:  [53-108] 99 (07/15 0900) Resp:  [10-24] 18 (07/15 0900) BP: (86-142)/(51-87) 102/74 (07/15 0900) SpO2:  [73 %-100 %] 97 % (07/15 0900) Last BM Date :  (PTA)  Weight change: Filed Weights   03/07/24 1619  Weight: 58.4 kg    Intake/Output:   Intake/Output Summary (Last 24 hours) at 03/12/2024 1011 Last data filed at 03/12/2024 0900 Gross per 24 hour  Intake 240 ml  Output 2665 ml  Net -2425 ml      Physical Exam    General: chronically ill appearing. No distress  HEENT: Normal Neck: no JVD  Cor: RRR, no MRG. + subxiphoid pericardial drain  Lungs: CTAB  Abdomen: Soft, nontender, nondistended.  Extremities: No cyanosis, clubbing, rash, no edema    Telemetry   NSR 90s personally reviewed   EKG    Sinus tach w/ PACs 102 bpm, personally reviewed   Labs    CBC Recent Labs    03/11/24 0230 03/12/24 0902  WBC 8.3 6.3  HGB 10.6* 10.3*  HCT 33.0* 32.6*  MCV 91.7 93.9  PLT 159 154   Basic Metabolic Panel Recent Labs    92/85/74 0230 03/12/24 0902  NA 140 137  K 3.8 3.8  CL 107 103  CO2 25 25  GLUCOSE 102* 243*  BUN 24*  22  CREATININE 1.41* 1.28*  CALCIUM  8.4* 8.3*  MG 2.1 2.1  PHOS 2.6 2.5   Liver Function Tests No results for input(s): AST, ALT, ALKPHOS, BILITOT, PROT, ALBUMIN in the last 72 hours. No results for input(s): LIPASE, AMYLASE in the last 72 hours. Cardiac Enzymes No results for input(s): CKTOTAL, CKMB, CKMBINDEX, TROPONINI in the last 72 hours.  BNP: BNP (last 3 results) Recent Labs    03/07/24 1502  BNP 67.7    ProBNP (last 3 results) No results for input(s): PROBNP in the last 8760 hours.   D-Dimer No results for input(s): DDIMER in the last 72 hours. Hemoglobin A1C No results for input(s): HGBA1C in the last 72 hours. Fasting Lipid Panel No results for input(s): CHOL, HDL, LDLCALC, TRIG, CHOLHDL, LDLDIRECT in the last 72 hours. Thyroid Function Tests No results for input(s): TSH, T4TOTAL, T3FREE, THYROIDAB in the last 72 hours.  Invalid input(s): FREET3  Other results:   Imaging    No results found.   Medications:     Scheduled Medications:  alfuzosin   10 mg Oral QPC breakfast   atorvastatin   20 mg Oral Daily   benztropine   0.5 mg Oral Daily   Chlorhexidine  Gluconate Cloth  6 each Topical Daily   colchicine   0.6 mg Oral Daily   enoxaparin  (LOVENOX )  injection  40 mg Subcutaneous Q24H   insulin  aspart  0-15 Units Subcutaneous TID WC   loratadine   10 mg Oral Daily   risperiDONE   0.5 mg Oral Daily   And   risperiDONE   3 mg Oral QHS   sertraline   50 mg Oral Daily    Infusions:   PRN Medications: acetaminophen  **OR** acetaminophen , senna-docusate    Assessment/Plan   Assessment & Plan  1. Large pericardial effusion -ESR 32  Query whether he may have had a subacute pericarditis in the last few months with slowly accumulating effusion, otherwise etiology is unknown. - s/p tap 7/12 with 650cc out. Initial fluid analysis indeterminate - Limited ECHO 7/12 EF 45-50% - only 5 cc of drainage  overnight. Bedside echo done today shows no effusion - plan to pull drain today  - Continue colchicine . Await full cytology  2. HFrEF - Echo 7/12 w/ mildly reduced EF 45-50%  - volume status currently ok - BPs remain soft, hold on starting GDMT for now    3. AKI - improving, SCr 1.40>>1.28 today  - follow BMP    4. Cognitive disorder - has caretaker  From our standpoint, he will be ok to transfer out of ICU after drain is removed.   Heart failure team will sign off as of 03/12/24  Follow up as an outpatient in the HF clinic ?   No  If no please follow up with [facility] after discharge.     Length of Stay: [address], PA-C  03/12/2024, 10:11 AM  Advanced Heart Failure Team Pager [phone] (M-F; 7a - 5p)  Please contact [facility] for night-coverage after hours (5p -7a ) and weekends on [email or web address]

## 2024-03-12 NOTE — Progress Notes (Signed)
 IMTS to assume care tomorrow 03/13/24 at 7AM.

## 2024-03-13 ENCOUNTER — Other Ambulatory Visit (HOSPITAL_COMMUNITY): Payer: Self-pay

## 2024-03-13 DIAGNOSIS — J90 Pleural effusion, not elsewhere classified: Secondary | ICD-10-CM

## 2024-03-13 DIAGNOSIS — E1159 Type 2 diabetes mellitus with other circulatory complications: Secondary | ICD-10-CM

## 2024-03-13 DIAGNOSIS — N401 Enlarged prostate with lower urinary tract symptoms: Secondary | ICD-10-CM

## 2024-03-13 DIAGNOSIS — D649 Anemia, unspecified: Secondary | ICD-10-CM

## 2024-03-13 DIAGNOSIS — I152 Hypertension secondary to endocrine disorders: Secondary | ICD-10-CM

## 2024-03-13 LAB — CBC
HCT: 31.5 % — ABNORMAL LOW (ref 39.0–52.0)
Hemoglobin: 10.2 g/dL — ABNORMAL LOW (ref 13.0–17.0)
MCH: 29.3 pg (ref 26.0–34.0)
MCHC: 32.4 g/dL (ref 30.0–36.0)
MCV: 90.5 fL (ref 80.0–100.0)
Platelets: 167 K/uL (ref 150–400)
RBC: 3.48 MIL/uL — ABNORMAL LOW (ref 4.22–5.81)
RDW: 14.6 % (ref 11.5–15.5)
WBC: 5.3 K/uL (ref 4.0–10.5)
nRBC: 0 % (ref 0.0–0.2)

## 2024-03-13 LAB — BASIC METABOLIC PANEL WITH GFR
Anion gap: 9 (ref 5–15)
BUN: 21 mg/dL (ref 8–23)
CO2: 24 mmol/L (ref 22–32)
Calcium: 8.4 mg/dL — ABNORMAL LOW (ref 8.9–10.3)
Chloride: 108 mmol/L (ref 98–111)
Creatinine, Ser: 1.13 mg/dL (ref 0.61–1.24)
GFR, Estimated: >60 mL/min (ref >60)
Glucose, Bld: 111 mg/dL — ABNORMAL HIGH (ref 70–99)
Potassium: 4.1 mmol/L (ref 3.5–5.1)
Sodium: 141 mmol/L (ref 135–145)

## 2024-03-13 LAB — MAGNESIUM: Magnesium: 2.2 mg/dL (ref 1.7–2.4)

## 2024-03-13 LAB — BODY FLUID CULTURE W GRAM STAIN: Culture: NO GROWTH

## 2024-03-13 LAB — PHOSPHORUS: Phosphorus: 2.6 mg/dL (ref 2.5–4.6)

## 2024-03-13 LAB — GLUCOSE, CAPILLARY
Glucose-Capillary: 180 mg/dL — ABNORMAL HIGH (ref 70–99)
Glucose-Capillary: 109 mg/dL — ABNORMAL HIGH (ref 70–99)

## 2024-03-13 MED ORDER — COLCHICINE 0.6 MG PO TABS
0.6000 mg | ORAL_TABLET | Freq: Every day | ORAL | 0 refills | Status: AC
  Filled 2024-03-13: qty 90, 90d supply, fill #0

## 2024-03-13 NOTE — Discharge Summary (Signed)
 Name: Fred [name] MRN: [id number] DOB: 07-26-1951 73 y.o. PCP: Pura Lenis, MD  Date of Admission: 03/07/2024  2:55 PM Date of Discharge: 03/13/2024 Attending Physician: Dr. Mliss Pouch  Discharge Diagnosis: 1. Principal Problem:   Pericardial effusion Active Problems:   Anemia   BPH with urinary obstruction   Hypertension associated with diabetes (HCC)   Intellectual disability   Type 2 diabetes mellitus without complication, without long-term current use of insulin  [facility])   Discharge Medications: Allergies as of 03/13/2024   No Known Allergies      Medication List     PAUSE taking these medications    amLODipine  10 MG tablet Wait to take this until your doctor or other care provider tells you to start again. Commonly known as: NORVASC  Take 10 mg by mouth daily.   carvedilol  6.25 MG tablet Wait to take this until your doctor or other care provider tells you to start again. Commonly known as: COREG  Take 1 tablet (6.25 mg total) by mouth 2 (two) times daily with a meal.   lisinopril-hydrochlorothiazide 20-25 MG tablet Wait to take this until your doctor or other care provider tells you to start again. Commonly known as: ZESTORETIC Take by mouth.   potassium chloride  10 MEQ tablet Wait to take this until your doctor or other care provider tells you to start again. Commonly known as: KLOR-CON  Take 10 mEq by mouth daily.       STOP taking these medications    potassium chloride  SA 20 MEQ tablet Commonly known as: KLOR-CON  M       TAKE these medications    alfuzosin  10 MG 24 hr tablet Commonly known as: UROXATRAL  Take 10 mg by mouth daily after breakfast.   Allergy Relief 10 MG tablet Generic drug: loratadine  Take 10 mg by mouth daily.   aspirin  EC 81 MG tablet Take 81 mg by mouth daily.   atorvastatin  20 MG tablet Commonly known as: LIPITOR Take 20 mg by mouth daily.   benztropine  0.5 MG tablet Commonly known as: COGENTIN  Take 0.5 mg  by mouth daily.   colchicine  0.6 MG tablet Take 1 tablet (0.6 mg total) by mouth daily. Start taking on: March 14, 2024   ferrous sulfate  325 (65 FE) MG tablet Take 325 mg by mouth every Monday, Wednesday, and Friday.   ketoconazole 2 % shampoo Commonly known as: NIZORAL Apply 1 Application topically 2 (two) times a week.   metFORMIN 500 MG tablet Commonly known as: GLUCOPHAGE Take 500 mg by mouth 2 (two) times daily.   risperiDONE  0.5 MG tablet Commonly known as: RISPERDAL  Take 0.5 mg by mouth daily.   risperiDONE  3 MG tablet Commonly known as: RISPERDAL  Take 3 mg by mouth at bedtime.   sertraline  50 MG tablet Commonly known as: ZOLOFT  Take 50 mg by mouth daily.        Disposition and follow-up:   Fred [name] was discharged from [facility] in Good condition.  At the hospital follow up visit please address:  #Pericardial effusion due to pericarditis S/P pericardiocentesis on 7/15 Fluid studies without malignancy. Per cardiology, likely due to pericarditis and started on colchicine  for three months. Please assess for any symptoms concerning for recurrence of pericarditis or tamponade.  #HTN Home medications of Lisinopril-Hydrochlorothiazide 20-25 mg x 2 tablets daily, Coreg  12.5mg  daily, and Amlodipine  10mg  held during admission as his BP was low normal. Medications held on discharge as well. Please check blood pressure and restart BP meds and potassium  supplement as necessary. Please check BMP.  #AKI His baseline is around 1.00 to 1.20. Elevated to 1.87 on admission. Improved to baseline and at discharge creatinine was 1.13. Please check BMP.   Labs / imaging needed at time of follow-up: BMP   Pending labs/ test needing follow-up: None  Follow-up Appointments:  Follow-up Information     Pura Lenis, MD. Call in 1 week(s).   Specialty: Family Medicine Contact information: [address] [place]  [address] [phone]                  Hospital Course by problem list: Fred [name] is a 73 y.o. with a pertinent PMH of Intellectual disability, DMII, HTN, and BPH, who presented with a pericardial effusion with some tamponade features, S/P cardiocentesis on 7/12 with removal of the pericardial fluid and removal of drain on 7/15, now being discharged on hospital day 5 with the following pertinent hospital course:  #Pericardial effusion due to pericarditis First noted on CT scan on 01/03/24. Was undergoing echocardiogram outpatient with his cardiologist and they were concerned for tamponade, so he was transferred to [facility] for further work-up and treatment. His echocardiogram did not show evidence of increased atrial pressure, but did show a pericardial effusion. There was mild atrial collapse. EKG showed findings consistent with pericardial effusion. He underwent pericardiocentesis on 7/12 with removal of 650 ccs of fluid. He remained in the ICU until 7/15 when the drain was removed after it had stopped draining fluid. Cytology was non-malignant. Cardiology recommended colchicine  0.6mg  daily for three months as they suspect pericarditis. Repeat bedside ultrasound showed resolution of his pericardial effusion.    #AKI His baseline is around 1.00 to 1.20. Elevated to 1.87 on admission. Cardiorenal vs volume depletion. IVC collapse seen on echo. BUN:Cr ratio of 19.2. Suspect pre-renal and gave small IVF bolus. Improved to baseline and at discharge creatinine was 1.13   #DMII On metformin 500mg  BID outpatient. Last A1C 5.5. Can resume at discharge his home metformin. Continued home atorvastatin ..    Stable chronic conditions   #HTN BP on admission 108/62 after taking morning meds. -Holding home Lisinopril-Hydrochlorothiazide 20-25 mg x 2 tablets daily, Coreg  12.5mg  daily, Amlodipine  10mg  daily in the setting of low-normal readings and large pericardial effusion with mild collapse.  Medications held at discharge until he can follow-up with with his PCP.   #IDD Intellectual disability without any combative behaviors, able to perform most ADLs on his own but does not speak in long sentences. Continue home medications of risperidone  0.5 mg in AM and 3 mg at bedtime and benztropine  0.5 mg daily. Sertraline  50mg  daily.   #Allergies  Resumed home loratadine  10 mg daily.     #BPH S/P Turp procedure 5/24. Continued home alfuzosin  10 mg daily.     Discharge Subjective: Patient seen without caretaker and history and exam limited by patient participation. He denies chest pain, shortness of breath, and abdominal pain.   Discharge Exam:   BP 120/80 (BP Location: Right Arm)   Pulse 89   Temp 98 F (36.7 C) (Oral)   Resp 17   Ht 5' 8 (1.727 m)   Wt 58.4 kg   SpO2 100%   BMI 19.58 kg/m  Discharge exam:   Physical Exam Constitutional:      General: He is not in acute distress.    Appearance: He is not ill-appearing.  HENT:     Mouth/Throat:     Mouth: Mucous membranes are moist.  Cardiovascular:     Rate and Rhythm: Normal rate and regular rhythm.  Pulmonary:     Effort: Pulmonary effort is normal. No respiratory distress.  Neurological:     Mental Status: He is alert.      Pertinent Labs, Studies, and Procedures:     Latest Ref Rng & Units 03/13/2024    2:13 AM 03/12/2024    9:02 AM 03/11/2024    2:30 AM  CBC  WBC 4.0 - 10.5 K/uL 5.3  6.3  8.3   Hemoglobin 13.0 - 17.0 g/dL 89.7  89.6  89.3   Hematocrit 39.0 - 52.0 % 31.5  32.6  33.0   Platelets 150 - 400 K/uL 167  154  159        Latest Ref Rng & Units 03/13/2024    2:13 AM 03/12/2024    9:02 AM 03/11/2024    2:30 AM  CMP  Glucose 70 - 99 mg/dL 888  756  897   BUN 8 - 23 mg/dL 21  22  24    Creatinine 0.61 - 1.24 mg/dL 8.86  8.71  8.58   Sodium 135 - 145 mmol/L 141  137  140   Potassium 3.5 - 5.1 mmol/L 4.1  3.8  3.8   Chloride 98 - 111 mmol/L 108  103  107   CO2 22 - 32 mmol/L 24  25  25     Calcium  8.9 - 10.3 mg/dL 8.4  8.3  8.4     ECHOCARDIOGRAM COMPLETE Result Date: 03/07/2024    ECHOCARDIOGRAM REPORT   Patient Name:   Fred [name] Date of Exam: 03/07/2024 Medical Rec #:  [id number]     Height:       68.0 in Accession #:    [id number]    Weight:       128.7 lb Date of Birth:  [birth year]     BSA:          1.695 m Patient Age:    73 years      BP:           117/71 mmHg Patient Gender: M             HR:           77 bpm. Exam Location:  Inpatient Procedure: 2D Echo, Color Doppler and Cardiac Doppler (Both Spectral and Color            Flow Doppler were utilized during procedure). Indications:    Pericardial Effusion  History:        Patient has no prior history of Echocardiogram examinations.                 Risk Factors:Diabetes and Hypertension.  Sonographer:    Carmelita Hartshorn RDCS, FE, PE Referring Phys: [id number] HAYLEY N NAASZ IMPRESSIONS  1. Left ventricular ejection fraction, by estimation, is 60 to 65%. The left ventricle has normal function. The left ventricle has no regional wall motion abnormalities. There is mild concentric left ventricular hypertrophy. Left ventricular diastolic parameters are indeterminate.  2. Right ventricular systolic function is normal. The right ventricular size is normal.  3. Large pericardial effusion. The pericardial effusion is circumferential.  4. The mitral valve is normal in structure. Trivial mitral valve regurgitation. No evidence of mitral stenosis.  5. The aortic valve is tricuspid. There is mild calcification of the aortic valve. Aortic valve regurgitation is not visualized. Aortic valve sclerosis/calcification is present, without any evidence of aortic stenosis.  6. The inferior vena  cava is normal in size with greater than 50% respiratory variability, suggesting right atrial pressure of 3 mmHg. Conclusion(s)/Recommendation(s): There is a large circumfrential pericardial effusion there is early diastolic RA collapse and mild repiratory variation in the  mitral inflow but no overt tamponade. FINDINGS  Left Ventricle: Left ventricular ejection fraction, by estimation, is 60 to 65%. The left ventricle has normal function. The left ventricle has no regional wall motion abnormalities. The left ventricular internal cavity size was normal in size. There is  mild concentric left ventricular hypertrophy. Left ventricular diastolic parameters are indeterminate. Right Ventricle: The right ventricular size is normal. No increase in right ventricular wall thickness. Right ventricular systolic function is normal. Left Atrium: Left atrial size was normal in size. Right Atrium: Right atrial size was normal in size. Pericardium: A large pericardial effusion is present. The pericardial effusion is circumferential. There is diastolic collapse of the right atrial wall. Mitral Valve: The mitral valve is normal in structure. Trivial mitral valve regurgitation. No evidence of mitral valve stenosis. Tricuspid Valve: The tricuspid valve is normal in structure. Tricuspid valve regurgitation is trivial. No evidence of tricuspid stenosis. Aortic Valve: The aortic valve is tricuspid. There is mild calcification of the aortic valve. Aortic valve regurgitation is not visualized. Aortic valve sclerosis/calcification is present, without any evidence of aortic stenosis. Aortic valve mean gradient measures 4.0 mmHg. Aortic valve peak gradient measures 7.4 mmHg. Aortic valve area, by VTI measures 3.65 cm. Pulmonic Valve: The pulmonic valve was normal in structure. Pulmonic valve regurgitation is trivial. No evidence of pulmonic stenosis. Aorta: The aortic root is normal in size and structure. Venous: The inferior vena cava is normal in size with greater than 50% respiratory variability, suggesting right atrial pressure of 3 mmHg. IAS/Shunts: No atrial level shunt detected by color flow Doppler.  LEFT VENTRICLE PLAX 2D LVIDd:         5.20 cm   Diastology LVIDs:         3.10 cm   LV e' medial:  8.58  cm/s LV PW:         1.20 cm   LV e' lateral: 12.50 cm/s LV IVS:        1.30 cm LVOT diam:     2.30 cm LV SV:         108 LV SV Index:   64 LVOT Area:     4.15 cm  RIGHT VENTRICLE RV S prime:     15.70 cm/s TAPSE (M-mode): 2.1 cm LEFT ATRIUM             Index        RIGHT ATRIUM           Index LA diam:        3.80 cm 2.24 cm/m   RA Area:     17.70 cm LA Vol (A2C):   56.8 ml 33.51 ml/m  RA Volume:   46.90 ml  27.67 ml/m LA Vol (A4C):   53.6 ml 31.63 ml/m LA Biplane Vol: 58.6 ml 34.58 ml/m  AORTIC VALVE AV Area (Vmax):    3.76 cm AV Area (Vmean):   3.73 cm AV Area (VTI):     3.65 cm AV Vmax:           136.00 cm/s AV Vmean:          95.400 cm/s AV VTI:            0.297 m AV Peak Grad:      7.4  mmHg AV Mean Grad:      4.0 mmHg LVOT Vmax:         123.00 cm/s LVOT Vmean:        85.600 cm/s LVOT VTI:          0.261 m LVOT/AV VTI ratio: 0.88  AORTA Ao Root diam: 3.50 cm Ao Asc diam:  3.60 cm TRICUSPID VALVE TR Peak grad:   9.4 mmHg TR Vmax:        153.00 cm/s  SHUNTS Systemic VTI:  0.26 m Systemic Diam: 2.30 cm Toribio Fuel MD Electronically signed by Toribio Fuel MD Signature Date/Time: 03/07/2024/6:24:04 PM    Final    DG Chest 2 View Result Date: 03/07/2024 CLINICAL DATA:  Leg swelling. EXAM: CHEST - 2 VIEW COMPARISON:  Jan 03, 2024. FINDINGS: Mild cardiomegaly is noted. Minimal bibasilar subsegmental atelectasis is noted. Bony thorax is unremarkable. IMPRESSION: Minimal bibasilar subsegmental atelectasis. Electronically Signed   By: Lynwood Landy Raddle M.D.   On: 03/07/2024 15:38     Discharge Instructions:  You were hospitalized for fluid around your heart. We treated this by taking the fluid off of your heart. Thank you for allowing us  to be part of your care.   Please reach out to your primary care doctor to schedule a hospital follow-up appointment in the next week or two.   Please note these changes made to your medications:   *Please START taking:  Colchicine  0.6mg  every day for 3  months.  *Please Temporarily stop taking:  Your blood pressure medcations until you see your primary care doctor Lisinopril-Hydrochlorothiazide Carvedilol  Amlodipine  Potassium supplement   Please make sure to call your primary care doctor to schedule a follow-up appointment. Signed: Napoleon Limes, MD 03/13/2024, 2:18 PM

## 2024-03-13 NOTE — Progress Notes (Signed)
  Progress Note  Patient Name: Fred [name] Date of Encounter: 03/13/2024 [facility] Cardiologist: None   Interval Summary   Challenging to communicate.  Does not appear to be in any distress.  Vital Signs Vitals:   03/12/24 1930 03/12/24 2248 03/13/24 0500 03/13/24 0708  BP: 134/85 116/76 111/84 121/62  Pulse: 84 87 (!) 102 96  Resp: 20 18 19  (!) 25  Temp: 97.7 F (36.5 C) 97.8 F (36.6 C) 97.9 F (36.6 C) 98.2 F (36.8 C)  TempSrc: Oral Oral Oral Oral  SpO2: 98% 94% 95% 98%  Weight:      Height:        Intake/Output Summary (Last 24 hours) at 03/13/2024 0905 Last data filed at 03/13/2024 0600 Gross per 24 hour  Intake 480 ml  Output 3000 ml  Net -2520 ml      03/07/2024    4:19 PM 11/21/2022    5:00 AM 11/20/2022    5:00 AM  Last 3 Weights  Weight (lbs) 128 lb 12 oz 128 lb 12 oz 129 lb 3 oz  Weight (kg) 58.4 kg 58.4 kg 58.6 kg      Telemetry/ECG  NSR - Personally Reviewed  Physical Exam  GEN: No acute distress.   Neck: No JVD Cardiac: RRR, no murmurs, rubs, or gallops. (Drain out) Respiratory: Clear to auscultation bilaterally. GI: Soft, nontender, non-distended  MS: No edema  Assessment & Plan   73 year old with large pericardial effusion, pericardial drain removed on 7/15 with hypertension hyperlipidemia diabetes cognitive disorder -ESR 32, slightly low for fulminant pericarditis but may be etiology. - Limited echo on the 12th showed EF 45 to 50% - Continuing with colchicine  for 3 months duration.  Heart failure with reduced ejection fraction - EF 45 to 50%, stable volume status.  Blood pressure is soft. - Given decreased blood pressure, holding off on goal-directed medical therapy.  Cognitive disorder - Has caretaker  AKI - Improved creatinine from 1.4-1.1  OK to DC from cardiology perspective.  Will sign off.   For questions or updates, please contact  [facility] Please consult [email or web address] for contact info under        Signed, Oneil Parchment, MD

## 2024-03-13 NOTE — Discharge Instructions (Addendum)
 You were hospitalized for fluid around your heart. We treated this by taking the fluid off of your heart. Thank you for allowing us  to be part of your care.   Please reach out to your primary care doctor to schedule a hospital follow-up appointment in the next week or two.   Please note these changes made to your medications:   *Please START taking:  Colchicine  0.6mg  every day for 3 months.  *Please Temporarily stop taking:  Your blood pressure medcations until you see your primary care doctor Lisinopril-Hydrochlorothiazide Carvedilol  Amlodipine  Potassium supplement   Please make sure to call your primary care doctor to schedule a follow-up appointment.

## 2024-03-13 NOTE — Care Management Important Message (Signed)
 Important Message  Patient Details  Name: Fred [name] MRN: [id number] Date of Birth: 23-Jul-1951   Important Message Given:  Yes - Medicare IM     [name] 03/13/2024, 11:11 AM

## 2024-03-13 NOTE — Plan of Care (Signed)

## 2024-03-13 NOTE — Progress Notes (Incomplete)
 HD#5 SUBJECTIVE:  Patient Summary: Fred [name] is a 73 y.o. with a pertinent PMH of Intellectual disability, DMII, HTN, and BPH, who presented with a pericardial effusion with some tamponade features, transferred to the ICU S/P cardiocentesis on 7/12 with removal of the pericardial fluid for management of the drain.  Overnight Events: ***  Interim History: ***  Seen by heart failure team yesterday who recommended colchicine  and pulled the pericardial drain yesterday.  OBJECTIVE:  Vital Signs: Vitals:   03/12/24 1500 03/12/24 1549 03/12/24 1930 03/12/24 2248  BP: (!) 136/91 136/86 134/85 116/76  Pulse: 89 85 84 87  Resp: 17 20 20 18   Temp:  97.8 F (36.6 C) 97.7 F (36.5 C) 97.8 F (36.6 C)  TempSrc:  Axillary Oral Oral  SpO2: 100% 96% 98% 94%  Weight:      Height:       Supplemental O2: {NAMES:3044014::Room Air,Nasal Cannula,Simple Face Mask,Partial Rebreather,HFNC,Non Rebreather,Venturi Mask,Bag Valve Mask} SpO2: 94 %  Filed Weights   03/07/24 1619  Weight: 58.4 kg     Intake/Output Summary (Last 24 hours) at 03/13/2024 0522 Last data filed at 03/13/2024 0500 Gross per 24 hour  Intake 240 ml  Output 3025 ml  Net -2785 ml   Net IO Since Admission: -11,865 mL [03/13/24 0522]  Physical Exam: Physical Exam ***  Patient Lines/Drains/Airways Status     Active Line/Drains/Airways     Name Placement date Placement time Site Days   Peripheral IV 03/07/24 20 G Left Antecubital 03/07/24  1851  Antecubital  6   External Urinary Catheter 03/10/24  --  --  3            Pertinent labs and imaging:  ***    Latest Ref Rng & Units 03/13/2024    2:13 AM 03/12/2024    9:02 AM 03/11/2024    2:30 AM  CBC  WBC 4.0 - 10.5 K/uL 5.3  6.3  8.3   Hemoglobin 13.0 - 17.0 g/dL 89.7  89.6  89.3   Hematocrit 39.0 - 52.0 % 31.5  32.6  33.0   Platelets 150 - 400 K/uL 167  154  159        Latest Ref Rng & Units 03/13/2024    2:13 AM 03/12/2024    9:02 AM  03/11/2024    2:30 AM  CMP  Glucose 70 - 99 mg/dL 888  756  897   BUN 8 - 23 mg/dL 21  22  24    Creatinine 0.61 - 1.24 mg/dL 8.86  8.71  8.58   Sodium 135 - 145 mmol/L 141  137  140   Potassium 3.5 - 5.1 mmol/L 4.1  3.8  3.8   Chloride 98 - 111 mmol/L 108  103  107   CO2 22 - 32 mmol/L 24  25  25    Calcium  8.9 - 10.3 mg/dL 8.4  8.3  8.4     ECHOCARDIOGRAM LIMITED Result Date: 03/12/2024    ECHOCARDIOGRAM LIMITED REPORT   Patient Name:   Fred [name] Date of Exam: 03/12/2024 Medical Rec #:  [id number]     Height:       68.0 in Accession #:    [id number]    Weight:       128.7 lb Date of Birth:  February 05, 1951     BSA:          1.695 m Patient Age:    73 years      BP:  111/77 mmHg Patient Gender: M             HR:           107 bpm. Exam Location:  Inpatient Procedure: Limited Echo and Limited Color Doppler (Both Spectral and Color Flow            Doppler were utilized during procedure). Indications:    Pericardial effusion I31.3  History:        Patient has prior history of Echocardiogram examinations, most                 recent 03/09/2024. CKD, stage 3; Risk Factors:Hypertension and                 Diabetes.  Sonographer:    Thea Norlander RCS Referring Phys: BENJAMIN J STONER IMPRESSIONS  1. Left ventricular ejection fraction, by estimation, is 55 to 60%. The left ventricle has normal function. The left ventricle has no regional wall motion abnormalities. Left ventricular diastolic function could not be evaluated.  2. Right ventricular systolic function is normal. The right ventricular size is normal.  3. The mitral valve is normal in structure. No evidence of mitral valve regurgitation. No evidence of mitral stenosis.  4. The aortic valve is tricuspid. There is mild calcification of the aortic valve. There is mild thickening of the aortic valve. Aortic valve sclerosis/calcification is present, without any evidence of aortic stenosis.  5. The inferior vena cava is normal in size with greater  than 50% respiratory variability, suggesting right atrial pressure of 3 mmHg. Comparison(s): Prior images reviewed side by side. The left ventricular function has improved. FINDINGS  Left Ventricle: Left ventricular ejection fraction, by estimation, is 55 to 60%. The left ventricle has normal function. The left ventricle has no regional wall motion abnormalities. The left ventricular internal cavity size was normal in size. There is  no left ventricular hypertrophy. Left ventricular diastolic function could not be evaluated. Right Ventricle: The right ventricular size is normal. No increase in right ventricular wall thickness. Right ventricular systolic function is normal. Left Atrium: Left atrial size was normal in size. Right Atrium: Right atrial size was normal in size. Pericardium: There is no evidence of pericardial effusion. Mitral Valve: The mitral valve is normal in structure. No evidence of mitral valve stenosis. Tricuspid Valve: The tricuspid valve is normal in structure. Aortic Valve: The aortic valve is tricuspid. There is mild calcification of the aortic valve. There is mild thickening of the aortic valve. Aortic valve sclerosis/calcification is present, without any evidence of aortic stenosis. Pulmonic Valve: The pulmonic valve was grossly normal. Venous: The inferior vena cava is normal in size with greater than 50% respiratory variability, suggesting right atrial pressure of 3 mmHg. LEFT VENTRICLE PLAX 2D LVIDd:         4.40 cm LVIDs:         3.10 cm LV PW:         0.80 cm LV IVS:        1.10 cm  IVC IVC diam: 1.70 cm LEFT ATRIUM         Index LA diam:    3.10 cm 1.83 cm/m Jerel Croitoru MD Electronically signed by Jerel Balding MD Signature Date/Time: 03/12/2024/11:49:42 AM    Final     ASSESSMENT/PLAN:  Assessment: Principal Problem:   Pericardial effusion Active Problems:   Anemia   BPH with urinary obstruction   Hypertension associated with diabetes (HCC)   Intellectual disability  Type 2 diabetes mellitus without complication, without long-term current use of insulin  (HCC)  Fred [name] is a 73 y.o. with a pertinent PMH of Intellectual disability, DMII, HTN, and BPH, who presented with a pericardial effusion with some tamponade features, S/P cardiocentesis on 7/12 with removal of the pericardial fluid and removal of drain on 7/15.  Plan: #Threatened obstructive shock from pericardial effusion with tamponade features. Unclear cause of the pericardial effusion. ESR 32 could be from a subacute pericarditis with slowly accumulated effusion. Fluid studies with no malignant cells and some reactive mesothelial cells. Drain removed after less then 5ccs of accumulation on 7/15. Heart failure team and PCCM signed off, general cardiology will take over. ECHO on 7/15 showed EF of 55-60% improved from 7/12  Plan: -Continue colchicine  per cardiology -POCUS today to evaluate reaccumulation.  -Hold off on GDMT in the setting of soft blood pressures  #DMII  On metformin 500mg  BID outpatient. Last A1C 5.5. His glucose was 111 on the BMP this morning. Received 5 units of insulin  today -Holding Metformin -Daily BMP -Atorvastatin  20mg  daily -ASA 81 mg daily,   -on SSI with moderate coverage  #AKI (resolved) His baseline is around 1.00 to 1.20. Elevated to 1.87 on admission. Cardiorenal vs volume depletion. Cr improved to 1.13 today Plan: -Encourage oral hydration -BMP daily.   Stable chronic conditions   #HTN  BP on admission 108/62 after taking morning meds. BP 125/79 this morning. -Holding home Lisinopril-Hydrochlorothiazide 20-25 mg x 2 tablets daily, Coreg  12.5mg  daily, Amlodipine  10mg  daily in the setting of low-normal readings.   #IDD Intellectual disability without any combative behaviors, able to perform most ADLs on his own but does not speak in long sentences. -Continue home medications of risperidone  0.5 mg in AM and 3 mg at bedtime and benztropine  0.5 mg  daily -Sertraline  50mg  daily   #Allergies  -loratadine  10 mg daily    #BPH S/P Turp procedure 5/24 -alfuzosin  10 mg daily   Best Practice: Diet: {CHL DISCHARGE DIET:21201} IVF: Fluids: {Meds; iv fluids:31617}, Rate: {NAMES:3044014::None,*** cc/hr x *** hrs,*** cc bolus} VTE: enoxaparin  (LOVENOX ) injection 40 mg Start: 03/09/24 2200 Code: {NAMES:3044014::Full,DNR,DNI,DNR/DNI,Comfort Care,Unknown}  Disposition planning: Therapy Recs: {NAMES:3044014::None,Pending,CIR,SNF,ALF,LTAC,Home Health}, DME: {Assistive Devices IFZ:80464} Family Contact: ***, {Family:[id number]::to be notified.} DISPO: Anticipated discharge {NAMES:3044014::today,tomorrow,in *** days} to {Discharge Destination:18313::Home} pending {BARRIERS TO DISCHARGE:24135}.  Signature:  Melvenia [facility]  5:22 AM, 03/13/2024  On Call pager [phone]

## 2024-04-12 ENCOUNTER — Ambulatory Visit: Admitting: Podiatry

## 2024-04-15 ENCOUNTER — Other Ambulatory Visit: Payer: Self-pay

## 2024-04-15 ENCOUNTER — Emergency Department (HOSPITAL_COMMUNITY)

## 2024-04-15 ENCOUNTER — Encounter (HOSPITAL_COMMUNITY): Payer: Self-pay | Admitting: Internal Medicine

## 2024-04-15 ENCOUNTER — Inpatient Hospital Stay (HOSPITAL_COMMUNITY)
Admission: EM | Admit: 2024-04-15 | Discharge: 2024-04-20 | DRG: 186 | Disposition: A | Discharge status: Home / Self Care | Attending: Family Medicine | Admitting: Family Medicine

## 2024-04-15 DIAGNOSIS — I272 Pulmonary hypertension, unspecified: Secondary | ICD-10-CM | POA: Diagnosis present

## 2024-04-15 DIAGNOSIS — I3139 Other pericardial effusion (noninflammatory): Secondary | ICD-10-CM

## 2024-04-15 DIAGNOSIS — E785 Hyperlipidemia, unspecified: Secondary | ICD-10-CM | POA: Diagnosis present

## 2024-04-15 DIAGNOSIS — I2510 Atherosclerotic heart disease of native coronary artery without angina pectoris: Secondary | ICD-10-CM | POA: Diagnosis present

## 2024-04-15 DIAGNOSIS — A419 Sepsis, unspecified organism: Secondary | ICD-10-CM

## 2024-04-15 DIAGNOSIS — Z1152 Encounter for screening for COVID-19: Secondary | ICD-10-CM | POA: Diagnosis not present

## 2024-04-15 DIAGNOSIS — E1122 Type 2 diabetes mellitus with diabetic chronic kidney disease: Secondary | ICD-10-CM | POA: Diagnosis present

## 2024-04-15 DIAGNOSIS — D649 Anemia, unspecified: Secondary | ICD-10-CM | POA: Diagnosis present

## 2024-04-15 DIAGNOSIS — J159 Unspecified bacterial pneumonia: Secondary | ICD-10-CM | POA: Diagnosis present

## 2024-04-15 DIAGNOSIS — D631 Anemia in chronic kidney disease: Secondary | ICD-10-CM | POA: Diagnosis present

## 2024-04-15 DIAGNOSIS — I509 Heart failure, unspecified: Secondary | ICD-10-CM

## 2024-04-15 DIAGNOSIS — J9601 Acute respiratory failure with hypoxia: Principal | ICD-10-CM | POA: Diagnosis present

## 2024-04-15 DIAGNOSIS — Z7982 Long term (current) use of aspirin: Secondary | ICD-10-CM

## 2024-04-15 DIAGNOSIS — R0602 Shortness of breath: Secondary | ICD-10-CM | POA: Diagnosis present

## 2024-04-15 DIAGNOSIS — F79 Unspecified intellectual disabilities: Secondary | ICD-10-CM | POA: Diagnosis present

## 2024-04-15 DIAGNOSIS — Z9889 Other specified postprocedural states: Secondary | ICD-10-CM | POA: Diagnosis not present

## 2024-04-15 DIAGNOSIS — Z7984 Long term (current) use of oral hypoglycemic drugs: Secondary | ICD-10-CM | POA: Diagnosis not present

## 2024-04-15 DIAGNOSIS — E1159 Type 2 diabetes mellitus with other circulatory complications: Secondary | ICD-10-CM | POA: Diagnosis present

## 2024-04-15 DIAGNOSIS — I5020 Unspecified systolic (congestive) heart failure: Secondary | ICD-10-CM | POA: Diagnosis not present

## 2024-04-15 DIAGNOSIS — N138 Other obstructive and reflux uropathy: Secondary | ICD-10-CM | POA: Diagnosis present

## 2024-04-15 DIAGNOSIS — Z87891 Personal history of nicotine dependence: Secondary | ICD-10-CM | POA: Diagnosis not present

## 2024-04-15 DIAGNOSIS — I5030 Unspecified diastolic (congestive) heart failure: Secondary | ICD-10-CM | POA: Diagnosis not present

## 2024-04-15 DIAGNOSIS — I358 Other nonrheumatic aortic valve disorders: Secondary | ICD-10-CM | POA: Diagnosis not present

## 2024-04-15 DIAGNOSIS — Z66 Do not resuscitate: Secondary | ICD-10-CM | POA: Diagnosis present

## 2024-04-15 DIAGNOSIS — R6250 Unspecified lack of expected normal physiological development in childhood: Secondary | ICD-10-CM | POA: Diagnosis present

## 2024-04-15 DIAGNOSIS — J189 Pneumonia, unspecified organism: Secondary | ICD-10-CM

## 2024-04-15 DIAGNOSIS — I517 Cardiomegaly: Secondary | ICD-10-CM | POA: Diagnosis not present

## 2024-04-15 DIAGNOSIS — J9600 Acute respiratory failure, unspecified whether with hypoxia or hypercapnia: Secondary | ICD-10-CM | POA: Diagnosis present

## 2024-04-15 DIAGNOSIS — J90 Pleural effusion, not elsewhere classified: Secondary | ICD-10-CM | POA: Diagnosis present

## 2024-04-15 DIAGNOSIS — N401 Enlarged prostate with lower urinary tract symptoms: Secondary | ICD-10-CM | POA: Diagnosis present

## 2024-04-15 DIAGNOSIS — N179 Acute kidney failure, unspecified: Secondary | ICD-10-CM | POA: Diagnosis present

## 2024-04-15 DIAGNOSIS — N1832 Chronic kidney disease, stage 3b: Secondary | ICD-10-CM | POA: Diagnosis present

## 2024-04-15 DIAGNOSIS — I5022 Chronic systolic (congestive) heart failure: Secondary | ICD-10-CM | POA: Diagnosis present

## 2024-04-15 DIAGNOSIS — Z79899 Other long term (current) drug therapy: Secondary | ICD-10-CM

## 2024-04-15 DIAGNOSIS — I5023 Acute on chronic systolic (congestive) heart failure: Secondary | ICD-10-CM | POA: Diagnosis not present

## 2024-04-15 DIAGNOSIS — I10 Essential (primary) hypertension: Secondary | ICD-10-CM | POA: Diagnosis not present

## 2024-04-15 DIAGNOSIS — N1831 Chronic kidney disease, stage 3a: Secondary | ICD-10-CM | POA: Diagnosis present

## 2024-04-15 DIAGNOSIS — I152 Hypertension secondary to endocrine disorders: Secondary | ICD-10-CM | POA: Diagnosis present

## 2024-04-15 DIAGNOSIS — E119 Type 2 diabetes mellitus without complications: Secondary | ICD-10-CM

## 2024-04-15 LAB — CBC WITH DIFFERENTIAL/PLATELET
Abs Immature Granulocytes: 0.06 K/uL (ref 0.00–0.07)
Basophils Absolute: 0 K/uL (ref 0.0–0.1)
Basophils Relative: 0 %
Eosinophils Absolute: 0 K/uL (ref 0.0–0.5)
Eosinophils Relative: 0 %
HCT: 32.3 % — ABNORMAL LOW (ref 39.0–52.0)
Hemoglobin: 10.1 g/dL — ABNORMAL LOW (ref 13.0–17.0)
Immature Granulocytes: 1 %
Lymphocytes Relative: 4 %
Lymphs Abs: 0.3 K/uL — ABNORMAL LOW (ref 0.7–4.0)
MCH: 28.5 pg (ref 26.0–34.0)
MCHC: 31.3 g/dL (ref 30.0–36.0)
MCV: 91.2 fL (ref 80.0–100.0)
Monocytes Absolute: 0.6 K/uL (ref 0.1–1.0)
Monocytes Relative: 8 %
Neutro Abs: 6.7 K/uL (ref 1.7–7.7)
Neutrophils Relative %: 87 %
Platelets: 228 K/uL (ref 150–400)
RBC: 3.54 MIL/uL — ABNORMAL LOW (ref 4.22–5.81)
RDW: 14 % (ref 11.5–15.5)
WBC: 7.7 K/uL (ref 4.0–10.5)
nRBC: 0 % (ref 0.0–0.2)

## 2024-04-15 LAB — I-STAT VENOUS BLOOD GAS, ED
Acid-base deficit: 1 mmol/L (ref 0.0–2.0)
Bicarbonate: 23.4 mmol/L (ref 20.0–28.0)
Calcium, Ion: 1.12 mmol/L — ABNORMAL LOW (ref 1.15–1.40)
HCT: 31 % — ABNORMAL LOW (ref 39.0–52.0)
Hemoglobin: 10.5 g/dL — ABNORMAL LOW (ref 13.0–17.0)
O2 Saturation: 92 %
Potassium: 3.8 mmol/L (ref 3.5–5.1)
Sodium: 135 mmol/L (ref 135–145)
TCO2: 25 mmol/L (ref 22–32)
pCO2, Ven: 39 mmHg — ABNORMAL LOW (ref 44–60)
pH, Ven: 7.386 (ref 7.25–7.43)
pO2, Ven: 63 mmHg — ABNORMAL HIGH (ref 32–45)

## 2024-04-15 LAB — COMPREHENSIVE METABOLIC PANEL WITH GFR
ALT: 28 U/L (ref 0–44)
AST: 37 U/L (ref 15–41)
Albumin: 2.6 g/dL — ABNORMAL LOW (ref 3.5–5.0)
Alkaline Phosphatase: 66 U/L (ref 38–126)
Anion gap: 14 (ref 5–15)
BUN: 16 mg/dL (ref 8–23)
CO2: 21 mmol/L — ABNORMAL LOW (ref 22–32)
Calcium: 8.3 mg/dL — ABNORMAL LOW (ref 8.9–10.3)
Chloride: 99 mmol/L (ref 98–111)
Creatinine, Ser: 1.21 mg/dL (ref 0.61–1.24)
GFR, Estimated: >60 mL/min (ref >60)
Glucose, Bld: 183 mg/dL — ABNORMAL HIGH (ref 70–99)
Potassium: 3.8 mmol/L (ref 3.5–5.1)
Sodium: 134 mmol/L — ABNORMAL LOW (ref 135–145)
Total Bilirubin: 0.6 mg/dL (ref 0.0–1.2)
Total Protein: 6.5 g/dL (ref 6.5–8.1)

## 2024-04-15 LAB — RESPIRATORY PANEL BY PCR

## 2024-04-15 LAB — BODY FLUID CELL COUNT WITH DIFFERENTIAL
Eos, Fluid: 0 %
Lymphs, Fluid: 21 %
Monocyte-Macrophage-Serous Fluid: 2 % — ABNORMAL LOW (ref 50–90)
Neutrophil Count, Fluid: 77 % — ABNORMAL HIGH (ref 0–25)
Total Nucleated Cell Count, Fluid: 3981 uL — ABNORMAL HIGH (ref 0–1000)

## 2024-04-15 LAB — I-STAT CG4 LACTIC ACID, ED: Lactic Acid, Venous: 1.7 mmol/L (ref 0.5–1.9)

## 2024-04-15 LAB — MRSA NEXT GEN BY PCR, NASAL: MRSA by PCR Next Gen: NOT DETECTED

## 2024-04-15 LAB — I-STAT CHEM 8, ED
BUN: 17 mg/dL (ref 8–23)
Calcium, Ion: 1.07 mmol/L — ABNORMAL LOW (ref 1.15–1.40)
Chloride: 101 mmol/L (ref 98–111)
Creatinine, Ser: 1.1 mg/dL (ref 0.61–1.24)
Glucose, Bld: 184 mg/dL — ABNORMAL HIGH (ref 70–99)
HCT: 31 % — ABNORMAL LOW (ref 39.0–52.0)
Hemoglobin: 10.5 g/dL — ABNORMAL LOW (ref 13.0–17.0)
Potassium: 3.8 mmol/L (ref 3.5–5.1)
Sodium: 136 mmol/L (ref 135–145)
TCO2: 22 mmol/L (ref 22–32)

## 2024-04-15 LAB — TROPONIN I (HIGH SENSITIVITY)
Troponin I (High Sensitivity): 13 ng/L (ref <18)
Troponin I (High Sensitivity): 13 ng/L (ref <18)

## 2024-04-15 LAB — RESP PANEL BY RT-PCR (RSV, FLU A&B, COVID)  RVPGX2
Influenza A by PCR: NEGATIVE
Influenza B by PCR: NEGATIVE
Resp Syncytial Virus by PCR: NEGATIVE
SARS Coronavirus 2 by RT PCR: NEGATIVE

## 2024-04-15 LAB — C-REACTIVE PROTEIN: CRP: 14.5 mg/dL — ABNORMAL HIGH (ref <1.0)

## 2024-04-15 LAB — CBG MONITORING, ED: Glucose-Capillary: 160 mg/dL — ABNORMAL HIGH (ref 70–99)

## 2024-04-15 LAB — ECHOCARDIOGRAM LIMITED
Height: 68 in
S' Lateral: 2.7 cm
Weight: 2059.98 [oz_av]

## 2024-04-15 LAB — PROTEIN, PLEURAL OR PERITONEAL FLUID: Total protein, fluid: 3.9 g/dL

## 2024-04-15 LAB — PROCALCITONIN: Procalcitonin: 0.68 ng/mL

## 2024-04-15 LAB — SEDIMENTATION RATE: Sed Rate: 91 mm/h — ABNORMAL HIGH (ref 0–16)

## 2024-04-15 LAB — ALBUMIN, PLEURAL OR PERITONEAL FLUID: Albumin, Fluid: 2 g/dL

## 2024-04-15 LAB — BRAIN NATRIURETIC PEPTIDE: B Natriuretic Peptide: 55.6 pg/mL (ref 0.0–100.0)

## 2024-04-15 LAB — LACTATE DEHYDROGENASE, PLEURAL OR PERITONEAL FLUID: LD, Fluid: 293 U/L — ABNORMAL HIGH (ref 3–23)

## 2024-04-15 LAB — GLUCOSE, CAPILLARY
Glucose-Capillary: 174 mg/dL — ABNORMAL HIGH (ref 70–99)
Glucose-Capillary: 186 mg/dL — ABNORMAL HIGH (ref 70–99)

## 2024-04-15 LAB — HEMOGLOBIN A1C
Hgb A1c MFr Bld: 5.3 % (ref 4.8–5.6)
Mean Plasma Glucose: 105.41 mg/dL

## 2024-04-15 LAB — GLUCOSE, PLEURAL OR PERITONEAL FLUID: Glucose, Fluid: 152 mg/dL

## 2024-04-15 MED ORDER — SODIUM CHLORIDE 0.9 % IV SOLN
2.0000 g | Freq: Once | INTRAVENOUS | Status: AC
  Administered 2024-04-15: 2 g via INTRAVENOUS
  Filled 2024-04-15: qty 20

## 2024-04-15 MED ORDER — ATORVASTATIN CALCIUM 10 MG PO TABS
20.0000 mg | ORAL_TABLET | Freq: Every day | ORAL | Status: DC
  Administered 2024-04-16 – 2024-04-20 (×5): 20 mg via ORAL
  Filled 2024-04-15 (×5): qty 2

## 2024-04-15 MED ORDER — INSULIN ASPART 100 UNIT/ML IJ SOLN
0.0000 [IU] | INTRAMUSCULAR | Status: DC
  Administered 2024-04-15 (×3): 1 [IU] via SUBCUTANEOUS

## 2024-04-15 MED ORDER — IPRATROPIUM-ALBUTEROL 0.5-2.5 (3) MG/3ML IN SOLN
3.0000 mL | Freq: Once | RESPIRATORY_TRACT | Status: AC
  Administered 2024-04-15: 3 mL via RESPIRATORY_TRACT
  Filled 2024-04-15: qty 3

## 2024-04-15 MED ORDER — FERROUS SULFATE 325 (65 FE) MG PO TABS
325.0000 mg | ORAL_TABLET | ORAL | Status: DC
  Administered 2024-04-17 – 2024-04-19 (×2): 325 mg via ORAL
  Filled 2024-04-15 (×2): qty 1

## 2024-04-15 MED ORDER — SODIUM CHLORIDE 0.9 % IV SOLN
500.0000 mg | Freq: Once | INTRAVENOUS | Status: AC
  Administered 2024-04-15: 500 mg via INTRAVENOUS
  Filled 2024-04-15: qty 5

## 2024-04-15 MED ORDER — CHLORHEXIDINE GLUCONATE CLOTH 2 % EX PADS
6.0000 | MEDICATED_PAD | Freq: Every day | CUTANEOUS | Status: DC
  Administered 2024-04-16 – 2024-04-20 (×5): 6 via TOPICAL

## 2024-04-15 MED ORDER — COLCHICINE 0.6 MG PO TABS
0.6000 mg | ORAL_TABLET | Freq: Every day | ORAL | Status: DC
  Administered 2024-04-16 – 2024-04-20 (×5): 0.6 mg via ORAL
  Filled 2024-04-15 (×5): qty 1

## 2024-04-15 MED ORDER — SODIUM CHLORIDE 0.9 % IV SOLN
2.0000 g | INTRAVENOUS | Status: DC
  Filled 2024-04-15: qty 20

## 2024-04-15 MED ORDER — RISPERIDONE 3 MG PO TABS
3.0000 mg | ORAL_TABLET | Freq: Every day | ORAL | Status: DC
  Administered 2024-04-15 – 2024-04-19 (×5): 3 mg via ORAL
  Filled 2024-04-15 (×8): qty 1

## 2024-04-15 MED ORDER — SERTRALINE HCL 50 MG PO TABS
50.0000 mg | ORAL_TABLET | Freq: Every day | ORAL | Status: DC
  Administered 2024-04-16 – 2024-04-20 (×5): 50 mg via ORAL
  Filled 2024-04-15 (×5): qty 1

## 2024-04-15 MED ORDER — POLYETHYLENE GLYCOL 3350 17 G PO PACK
17.0000 g | PACK | Freq: Every day | ORAL | Status: DC | PRN
  Administered 2024-04-16: 17 g via ORAL
  Filled 2024-04-15: qty 1

## 2024-04-15 MED ORDER — RISPERIDONE 0.5 MG PO TABS
0.5000 mg | ORAL_TABLET | Freq: Every day | ORAL | Status: DC
  Administered 2024-04-16 – 2024-04-20 (×5): 0.5 mg via ORAL
  Filled 2024-04-15 (×5): qty 1

## 2024-04-15 MED ORDER — ALFUZOSIN HCL ER 10 MG PO TB24
10.0000 mg | ORAL_TABLET | Freq: Every day | ORAL | Status: DC
  Administered 2024-04-16 – 2024-04-20 (×5): 10 mg via ORAL
  Filled 2024-04-15 (×5): qty 1

## 2024-04-15 MED ORDER — FUROSEMIDE 10 MG/ML IJ SOLN
80.0000 mg | Freq: Once | INTRAMUSCULAR | Status: AC
  Administered 2024-04-15: 80 mg via INTRAVENOUS
  Filled 2024-04-15: qty 8

## 2024-04-15 MED ORDER — BENZTROPINE MESYLATE 0.5 MG PO TABS
0.5000 mg | ORAL_TABLET | Freq: Every day | ORAL | Status: DC
  Administered 2024-04-16 – 2024-04-20 (×5): 0.5 mg via ORAL
  Filled 2024-04-15 (×5): qty 1

## 2024-04-15 MED ORDER — DOCUSATE SODIUM 100 MG PO CAPS
100.0000 mg | ORAL_CAPSULE | Freq: Two times a day (BID) | ORAL | Status: DC | PRN
  Administered 2024-04-16: 100 mg via ORAL
  Filled 2024-04-15: qty 1

## 2024-04-15 MED ORDER — PANTOPRAZOLE SODIUM 40 MG PO TBEC
40.0000 mg | DELAYED_RELEASE_TABLET | Freq: Every day | ORAL | Status: DC
  Administered 2024-04-15 – 2024-04-16 (×2): 40 mg via ORAL
  Filled 2024-04-15 (×2): qty 1

## 2024-04-15 MED ORDER — FUROSEMIDE 10 MG/ML IJ SOLN: 40.0000 mg | Freq: Every day | INTRAMUSCULAR | Status: DC

## 2024-04-15 MED ORDER — SODIUM CHLORIDE 0.9 % IV SOLN
500.0000 mg | INTRAVENOUS | Status: AC
  Administered 2024-04-16 – 2024-04-17 (×2): 500 mg via INTRAVENOUS
  Filled 2024-04-15 (×3): qty 5

## 2024-04-15 MED ORDER — IOHEXOL 350 MG/ML SOLN
75.0000 mL | Freq: Once | INTRAVENOUS | Status: AC | PRN
  Administered 2024-04-15: 75 mL via INTRAVENOUS

## 2024-04-15 NOTE — ED Notes (Signed)
RT called for bipap placement.

## 2024-04-15 NOTE — ED Notes (Signed)
 RT at bedside.

## 2024-04-15 NOTE — ED Notes (Signed)
 EDP at bedside to US 

## 2024-04-15 NOTE — ED Triage Notes (Addendum)
 Patient arrives via [facility] for shortness of breath from group home. Fire O2 75 on room air, 78 per EMS on arrival. HR irregular with PVCs. No pulm hx, hx of CHF. Patient transported on 6 liters, placed on 12 liters upon arrival to room. Patient mentation at baseline. Tachypnea 25, capno 25. Patient with hx of mild mental retardation. No meds today.

## 2024-04-15 NOTE — ED Notes (Signed)
 Echo at bedside

## 2024-04-15 NOTE — Consult Note (Addendum)
 Cardiology Consultation   Patient ID: Fred [name] MRN: [id number]; DOB: [birth year]  Admit date: 04/15/2024 Date of Consult: 04/15/2024  PCP:  Pura Lenis, MD   [facility] Providers Cardiologist:  Dr. Youlanda Caprio of [facility]     Patient Profile: Fred [name] is a 73 y.o. male with a hx of intellectual disability, HFmrEF, stage IIIa CKD, prior tobacco abuse, hypertension, type 2 diabetes, hyperlipidemia, large pericardial effusion s/p pericardiocentesis on 03/09/24 who is being seen 04/15/2024 for the evaluation of shortness of breath at the request of Jayson Pereyra DO.  History of Present Illness: Fred [name] is a 73 y.o. male with prior cardiac history listed below.  On 02/2024 the patient was referred to the emergency department by his cardiologist because of concerns for his pericardial effusion.  He was seen by the heart care team at [facility] and ended up receiving a pericardiocentesis on 03/09/2024 and 650 mL of fluid were extracted.  Because of this he was placed on colchicine  for 3 months.  No clear cause of the pericarditis was identified. Echocardiogram showed a moderately reduced LVEF of 45 to 50% and global hypokinesis.  Because blood pressure was hypotensive no GDMT was started.  Patient has difficulty communicating at baseline because of intellectual disability.  He presented to the emergency department via EMS services for shortness of breath and an SpO2 in the 70s.  The patient lives in a group home and they called emergency services because he appeared to be short of breath and was having difficulty breathing.  This improved after he was put on 15 L  oxygen with a nonrebreather.  The patient received 80 mg IV Lasix  and DuoNebs.  Was also started on IV antibiotics for possible pneumonia.  Patient interview limited by intellectual disability based upon chart review that at this patient's baseline.  I called and discussed with the patient's  sister Fred [name] and she reported that the patient is nonverbal at baseline.  She reported that she noticed worsening shortness of breath that started on Saturday (3 days ago).  He also noticed that the patient has been coughing more than usual.  The patient has also had a cough since he was discharged from his prior hospitalization.  At the group home the patient has someone who helps him take his medications every day.  Labs showed potassium of 3.8, sodium of 136, creatinine of 1.1, high-sensitivity troponin of 13 > 13, BNP of 55.6, hemoglobin of 10.5, lactic acid of 1.7, respiratory panel was negative.  Pulmonary CTA showed pulmonary embolus, large right pleural effusion, moderate to large left pleural effusion, borderline mediastinal hilar adenopathy, large pericardial effusion, moderate to severe stool burden, coronary artery calcification, aortic atherosclerosis, and enlarged pulmonary trunk indicative of pulmonary hypertension  EKG showed sinus tachycardia at a rate of 110.  Past Medical History:  Diagnosis Date   Diabetes mellitus    Hypertension    Mental retardation     Past Surgical History:  Procedure Laterality Date   PERICARDIOCENTESIS N/A 03/09/2024   Procedure: PERICARDIOCENTESIS;  Surgeon: Swaziland, Peter M, MD;  Location: [facility] INVASIVE CV LAB;  Service: Cardiovascular;  Laterality: N/A;     Home Medications:  Prior to Admission medications   Medication Sig Start Date End Date Taking? Authorizing Provider  alfuzosin  (UROXATRAL ) 10 MG 24 hr tablet Take 10 mg by mouth daily after breakfast.  03/30/20  Yes [provider]  ALLERGY RELIEF 10 MG tablet Take 10 mg by mouth daily.  Yes [provider]  aspirin  EC 81 MG tablet Take 81 mg by mouth daily.   Yes [provider]  atorvastatin  (LIPITOR) 20 MG tablet Take 20 mg by mouth daily.  02/28/18  Yes [provider]  benztropine  (COGENTIN ) 0.5 MG tablet Take 0.5 mg by mouth daily.    Yes [provider]  colchicine  0.6 MG tablet Take 1 tablet (0.6 mg total) by mouth daily. 03/14/24  Yes Smucker, Melvenia, MD  ferrous sulfate  325 (65 FE) MG tablet Take 325 mg by mouth every Monday, Wednesday, and Friday.  03/27/18  Yes [provider]  ketoconazole (NIZORAL) 2 % shampoo Apply 1 Application topically 2 (two) times a week.   Yes [provider]  metFORMIN (GLUCOPHAGE) 500 MG tablet Take 500 mg by mouth 2 (two) times daily.  01/20/15  Yes [provider]  potassium chloride  (KLOR-CON ) 10 MEQ tablet Take 10 mEq by mouth daily.   Yes [provider]  risperiDONE  (RISPERDAL ) 0.5 MG tablet Take 0.5 mg by mouth daily.   Yes [provider]  risperiDONE  (RISPERDAL ) 3 MG tablet Take 3 mg by mouth at bedtime. 05/16/19  Yes [provider]  sertraline  (ZOLOFT ) 50 MG tablet Take 50 mg by mouth daily.   Yes [provider]  amLODipine  (NORVASC ) 10 MG tablet Take 10 mg by mouth daily.  Patient not taking: Reported on 04/15/2024 05/22/19   [provider]  carvedilol  (COREG ) 6.25 MG tablet Take 1 tablet (6.25 mg total) by mouth 2 (two) times daily with a meal. Patient not taking: Reported on 04/15/2024 01/03/23   Jillian Buttery, MD  ciprofloxacin  (CIPRO ) 500 MG tablet Take 500 mg by mouth 2 (two) times daily. Patient not taking: Reported on 04/15/2024 03/23/24   [provider]  lisinopril-hydrochlorothiazide (ZESTORETIC) 20-25 MG tablet Take by mouth. Patient not taking: Reported on 04/15/2024    [provider]    Scheduled Meds:  Continuous Infusions:  azithromycin      cefTRIAXone  (ROCEPHIN )  IV     PRN Meds:   Allergies:   No Known Allergies  Social History:   Social History   Socioeconomic History   Marital status: Single    Spouse name: Not on file   Number of children: 0   Years of education: Not on file   Highest education level: Not on file  Occupational History   Not on file  Tobacco Use    Smoking status: Former    Current packs/day: 0.00    Types: Cigarettes    Quit date: 06/24/2016    Years since quitting: 7.8   Smokeless tobacco: Never  Substance and Sexual Activity   Alcohol use: No   Drug use: No   Sexual activity: Not on file  Other Topics Concern   Not on file  Social History Narrative   06/24/20 lives at home, has caregivers, guardian Fred [name]   Social Drivers of Health   Financial Resource Strain: Low Risk  (03/20/2024)   Received from [facility]   Overall Financial Resource Strain (CARDIA)    How hard is it for you to pay for the very basics like food, housing, medical care, and heating?: Not hard at all  Food Insecurity: No Food Insecurity (03/20/2024)   Received from [facility]   Hunger Vital Sign    Within the past 12 months, you worried that your food would run out before you got the money to buy more.: Never true    Within  the past 12 months, the food you bought just didn't last and you didn't have money to get more.: Never true  Transportation Needs: No Transportation Needs (03/20/2024)   Received from [facility] - Transportation    In the past 12 months, has lack of transportation kept you from medical appointments or from getting medications?: No    In the past 12 months, has lack of transportation kept you from meetings, work, or from getting things needed for daily living?: No  Physical Activity: Unknown (04/26/2023)   Received from [facility]   Exercise Vital Sign    On average, how many days per week do you engage in moderate to strenuous exercise (like a brisk walk)?: 0 days    Minutes of Exercise per Session: Not on file  Stress: No Stress Concern Present (04/26/2023)   Received from [facility] - Occupational Stress Questionnaire    Feeling of Stress : Not at all  Social Connections: Patient Unable To Answer (03/08/2024)   Social Connection and Isolation Panel    Frequency  of Communication with Friends and Family: Patient unable to answer    Frequency of Social Gatherings with Friends and Family: Patient unable to answer    Attends Religious Services: Patient unable to answer    Active Member of Clubs or Organizations: Patient unable to answer    Attends Banker Meetings: Patient unable to answer    Marital Status: Patient unable to answer  Intimate Partner Violence: Patient Unable To Answer (03/08/2024)   Humiliation, Afraid, Rape, and Kick questionnaire    Fear of Current or Ex-Partner: Patient unable to answer    Emotionally Abused: Patient unable to answer    Physically Abused: Patient unable to answer    Sexually Abused: Patient unable to answer    Family History:    Family History  Family history unknown: Yes     ROS:  Please see the history of present illness.   All other ROS reviewed and negative.     Physical Exam/Data: Vitals:   04/15/24 1115 04/15/24 1130 04/15/24 1230 04/15/24 1317  BP: 116/66 119/75 (!) 137/90   Pulse: (!) 112 63 (!) 122 (!) 110  Resp: (!) 27 (!) 25 (!) 27   Temp:      TempSrc:      SpO2: 98% 100% 97%   Weight:      Height:       No intake or output data in the 24 hours ending 04/15/24 1333    04/15/2024    9:54 AM 03/07/2024    4:19 PM 11/21/2022    5:00 AM  Last 3 Weights  Weight (lbs) 128 lb 12 oz 128 lb 12 oz 128 lb 12 oz  Weight (kg) 58.4 kg 58.4 kg 58.4 kg     Body mass index is 19.58 kg/m.  General: Cachectic, uses accessory muscles and work of breathing, wearing nonrebreather.  Patient was nonverbal on interview but this is his baseline. HEENT: normal Neck: no JVD Vascular: No carotid bruits; Distal pulses 2+ bilaterally Cardiac:  normal S1, S2; heart rate tachycardic, regular rhythm; no murmur Lungs: Wheezing heard throughout the lungs. Abd: soft, nontender, no hepatomegaly  Ext: no edema Musculoskeletal:  No deformities. Skin: warm and dry  Neuro:  no focal abnormalities  noted Psych:  Normal affect   EKG:  The EKG was personally reviewed and demonstrates:  sinus tachycardia at a rate of 110.  Telemetry:  Telemetry was personally reviewed and demonstrates: Sinus tachycardia with heart rates in the 100s to 120s.  Relevant CV Studies: Limited echo pending  Laboratory Data: High Sensitivity Troponin:   Recent Labs  Lab 04/15/24 1000  TROPONINIHS 13     Chemistry Recent Labs  Lab 04/15/24 1000 04/15/24 1013  NA 134* 136  135  K 3.8 3.8  3.8  CL 99 101  CO2 21*  --   GLUCOSE 183* 184*  BUN 16 17  CREATININE 1.21 1.10  CALCIUM  8.3*  --   GFRNONAA >60  --   ANIONGAP 14  --     Recent Labs  Lab 04/15/24 1000  PROT 6.5  ALBUMIN 2.6*  AST 37  ALT 28  ALKPHOS 66  BILITOT 0.6   Lipids No results for input(s): CHOL, TRIG, HDL, LABVLDL, LDLCALC, CHOLHDL in the last 168 hours.  Hematology Recent Labs  Lab 04/15/24 1000 04/15/24 1013  WBC 7.7  --   RBC 3.54*  --   HGB 10.1* 10.5*  10.5*  HCT 32.3* 31.0*  31.0*  MCV 91.2  --   MCH 28.5  --   MCHC 31.3  --   RDW 14.0  --   PLT 228  --    Thyroid No results for input(s): TSH, FREET4 in the last 168 hours.  BNP Recent Labs  Lab 04/15/24 1000  BNP 55.6    DDimer No results for input(s): DDIMER in the last 168 hours.  Radiology/Studies:  CT Angio Chest PE W and/or Wo Contrast Result Date: 04/15/2024 CLINICAL DATA:  Decreased O2 sat, arrhythmia, abdominal pain. EXAM: CT ANGIOGRAPHY CHEST CT ABDOMEN AND PELVIS WITH CONTRAST TECHNIQUE: Multidetector CT imaging of the chest was performed using the standard protocol during bolus administration of intravenous contrast. Multiplanar CT image reconstructions and MIPs were obtained to evaluate the vascular anatomy. Multidetector CT imaging of the abdomen and pelvis was performed using the standard protocol during bolus administration of intravenous contrast. RADIATION DOSE REDUCTION: This exam was performed according to the  departmental dose-optimization program which includes automated exposure control, adjustment of the mA and/or kV according to patient size and/or use of iterative reconstruction technique. CONTRAST:  75mL OMNIPAQUE  IOHEXOL  350 MG/ML SOLN COMPARISON:  CT abdomen pelvis 01/03/2024. FINDINGS: CTA CHEST FINDINGS Cardiovascular: Image quality is degraded by respiratory motion. Negative for pulmonary embolus. Atherosclerotic calcification of the aorta and coronary arteries. Enlarged pulmonic trunk and heart. Large pericardial effusion. Mediastinum/Nodes: Mediastinal lymph nodes measure up to 11 mm high right paratracheal station (3/21). Subcarinal lymph node measures 1.9 cm. Bihilar lymph nodes measure up to 12 mm on the left. Small axillary lymph nodes bilaterally. Air in the esophagus can be seen with dysmotility. Lungs/Pleura: Image quality is markedly degraded by respiratory motion and expiratory phase imaging. Large loculated right pleural effusion with collapse/consolidation in the posterior segment right upper lobe, right middle lobe and right lower lobe. Moderate to large left pleural effusion with collapse/consolidation in the left lower lobe. Airway is unremarkable. Musculoskeletal: Degenerative changes in the spine. Review of the MIP images confirms the above findings. CT ABDOMEN and PELVIS FINDINGS Hepatobiliary: Liver and gallbladder are unremarkable. No biliary ductal dilatation. Pancreas: Negative. Spleen: Negative. Adrenals/Urinary Tract: Adrenal glands are unremarkable. Low-attenuation lesions in the kidneys. No specific follow-up necessary. Ureters are decompressed. Bladder is grossly unremarkable. Stomach/Bowel: Stomach, small bowel and appendix are unremarkable. Moderate to severe stool burden. Vascular/Lymphatic: Atherosclerotic calcification of the aorta. No pathologically enlarged lymph nodes. Reproductive: Prostate is slightly  prominent. Other: No free fluid.  Mesenteries and peritoneum are  unremarkable. Musculoskeletal: Degenerative changes in the spine. Review of the MIP images confirms the above findings. IMPRESSION: 1. Negative for pulmonary embolus. 2. Large loculated right pleural effusion with compressive atelectasis in the right upper, right middle and right lower lobes. Moderate to large left pleural effusion with atelectasis in the left lower lobe. Difficult to exclude pneumonia. 3. Borderline mediastinal hilar adenopathy, possibly reactive in etiology. Difficult to exclude malignancy. Consider follow-up CT chest with contrast in 2-3 months, as clinically indicated. 4. Large pericardial effusion. 5. No acute findings in the abdomen or pelvis. 6. Moderate to severe stool burden. 7. Aortic atherosclerosis (ICD10-I70.0). Coronary artery calcification. 8. Enlarged pulmonic trunk, indicative of pulmonary arterial hypertension. Electronically Signed   By: Newell Eke M.D.   On: 04/15/2024 12:46   CT ABDOMEN PELVIS W CONTRAST Result Date: 04/15/2024 CLINICAL DATA:  Decreased O2 sat, arrhythmia, abdominal pain. EXAM: CT ANGIOGRAPHY CHEST CT ABDOMEN AND PELVIS WITH CONTRAST TECHNIQUE: Multidetector CT imaging of the chest was performed using the standard protocol during bolus administration of intravenous contrast. Multiplanar CT image reconstructions and MIPs were obtained to evaluate the vascular anatomy. Multidetector CT imaging of the abdomen and pelvis was performed using the standard protocol during bolus administration of intravenous contrast. RADIATION DOSE REDUCTION: This exam was performed according to the departmental dose-optimization program which includes automated exposure control, adjustment of the mA and/or kV according to patient size and/or use of iterative reconstruction technique. CONTRAST:  75mL OMNIPAQUE  IOHEXOL  350 MG/ML SOLN COMPARISON:  CT abdomen pelvis 01/03/2024. FINDINGS: CTA CHEST FINDINGS Cardiovascular: Image quality is degraded by respiratory motion. Negative  for pulmonary embolus. Atherosclerotic calcification of the aorta and coronary arteries. Enlarged pulmonic trunk and heart. Large pericardial effusion. Mediastinum/Nodes: Mediastinal lymph nodes measure up to 11 mm high right paratracheal station (3/21). Subcarinal lymph node measures 1.9 cm. Bihilar lymph nodes measure up to 12 mm on the left. Small axillary lymph nodes bilaterally. Air in the esophagus can be seen with dysmotility. Lungs/Pleura: Image quality is markedly degraded by respiratory motion and expiratory phase imaging. Large loculated right pleural effusion with collapse/consolidation in the posterior segment right upper lobe, right middle lobe and right lower lobe. Moderate to large left pleural effusion with collapse/consolidation in the left lower lobe. Airway is unremarkable. Musculoskeletal: Degenerative changes in the spine. Review of the MIP images confirms the above findings. CT ABDOMEN and PELVIS FINDINGS Hepatobiliary: Liver and gallbladder are unremarkable. No biliary ductal dilatation. Pancreas: Negative. Spleen: Negative. Adrenals/Urinary Tract: Adrenal glands are unremarkable. Low-attenuation lesions in the kidneys. No specific follow-up necessary. Ureters are decompressed. Bladder is grossly unremarkable. Stomach/Bowel: Stomach, small bowel and appendix are unremarkable. Moderate to severe stool burden. Vascular/Lymphatic: Atherosclerotic calcification of the aorta. No pathologically enlarged lymph nodes. Reproductive: Prostate is slightly prominent. Other: No free fluid.  Mesenteries and peritoneum are unremarkable. Musculoskeletal: Degenerative changes in the spine. Review of the MIP images confirms the above findings. IMPRESSION: 1. Negative for pulmonary embolus. 2. Large loculated right pleural effusion with compressive atelectasis in the right upper, right middle and right lower lobes. Moderate to large left pleural effusion with atelectasis in the left lower lobe. Difficult to  exclude pneumonia. 3. Borderline mediastinal hilar adenopathy, possibly reactive in etiology. Difficult to exclude malignancy. Consider follow-up CT chest with contrast in 2-3 months, as clinically indicated. 4. Large pericardial effusion. 5. No acute findings in the abdomen or pelvis. 6. Moderate to severe stool burden. 7. Aortic atherosclerosis (  ICD10-I70.0). Coronary artery calcification. 8. Enlarged pulmonic trunk, indicative of pulmonary arterial hypertension. Electronically Signed   By: Newell Eke M.D.   On: 04/15/2024 12:46   DG Chest Port 1 View Result Date: 04/15/2024 CLINICAL DATA:  Dyspnea EXAM: PORTABLE CHEST - 1 VIEW COMPARISON:  March 07, 2024 FINDINGS: Low lung volumes. Diffuse bilateral perihilar interstitial opacities. Small to moderate volume right and small left pleural effusions. No pneumothorax. Moderate cardiomegaly. Aortic atherosclerosis. No acute fracture or destructive lesions. Multilevel thoracic osteophytosis. IMPRESSION: Cardiomegaly with findings of interstitial edema. Bilateral pleural effusions, small to moderate on the right and small on the left. Electronically Signed   By: Rogelia Myers M.D.   On: 04/15/2024 11:28     Assessment and Plan:  Lanis Storlie is a 73 y.o. male with a hx of intellectual disability, HFmrEF, prior tobacco abuse, hypertension, type 2 diabetes, hyperlipidemia, large pericardial effusion s/p pericardiocentesis on 03/09/24 who is being seen 04/15/2024 for the evaluation of shortness of breath at the request of Jayson Pereyra DO.  Shortness of breath Bilateral pleural effusions Pericardial effusion s/p pericardial centesis Chronic HFmrEF On 02/2024 was previously hospitalized for a pericardial effusion and ended up receiving a pericardiocentesis and 650 mL were drawn off. The pericardial effusion had resolved on the final echo. The patient was started on colchicine . The patient's sister reported that he had worsening shortness of breath for the  past 3 days and has had a cough. The patient's caregiver called EMS services for shortness of breath.  When EMS services arrived the patient had an SpO2 in the 70s.  His SPO2 improved after he was put on 15 L  oxygen with a nonrebreather.  On exam patient appeared euvolemic. - Labs showed anemia with a hemoglobin of 10.5, a BNP of 55.6, respiratory panel was negative. - Pulmonary CTA showed pulmonary embolus, large right pleural effusion, moderate to large left pleural effusion, borderline mediastinal hilar adenopathy, large pericardial effusion, moderate to severe stool burden, coronary artery calcification, aortic atherosclerosis, and enlarged pulmonary trunk indicative of pulmonary hypertension The patient received 80 mg IV Lasix  and DuoNebs.  Was also started on IV antibiotics for possible pneumonia. Pleural effusions don't appear to be secondary to heart failure. Continue colchicine  Recommend thoracentesis Echo pending Start 40mg  IV lasix  daily.  Intellectual disability Nonverbal at baseline Complicates patient management. Management per primary   Type 2 diabetes Hyperlipidemia Continue atorvastatin  20 mg daily.  Otherwise managed per primary   Risk Assessment/Risk Scores:     [place]  Heart Association (NYHA) Functional Class NYHA Class IV       For questions or updates, please contact [facility] Please consult [email or web address] for contact info under    Signed, Morse Clause, PA-C  04/15/2024 1:33 PM   Agree with note by Morse Clause, PA-C  Asked to see this unfortunate 73 year old male with cognitive deficits who lives in a group home.  He has a history of hypertension, hyperlipidemia and type 2 diabetes.  He had a pericardiocentesis done approximately 1 month ago with a post intervention echo that showed improved LV function with trivial pericardial effusion.  He apparently has had progressive coughing, shortness of breath and hypoxia and was seen here where  chest CT showed large bilateral pleural effusions with questionable pericardial effusion.  He is currently getting thoracentesis.  The etiology of this is still unclear.  Formal 2D echo is pending.  If he does have recurrent pericardial effusion he may need a subxiphoid window.  Pulmonary services  admitting.  We will consult.  Dorn DOROTHA Lesches, M.D., FACP, [facility] [address], FAHA, [facility]  [address], [address] [place], [place]  [address]  [phone] 04/15/2024 4:01 PM

## 2024-04-15 NOTE — H&P (Signed)
 NAME:  Fred [name], MRN:  [id number], DOB:  21-Oct-1950, LOS: 0 ADMISSION DATE:  04/15/2024, CONSULTATION DATE:  8/18 REFERRING MD:  Elnor, CHIEF COMPLAINT:  acute respiratory failure    History of Present Illness:  This is a 73 year old male patient who resides at a group home.  At baseline he is ambulatory without oxygen, requires assistance with ADLs, not able to manage household.  Speaks in 1-2 word phrases.  Recently discharged on 7/16 after hospital admission where he was admitted for initially shortness of breath and leg swelling, found to have a large pericardial effusion with early tamponade physiology.  Ended up He underwent pericardial drain placement on 7/12.  Diagnostics were inconclusive as to etiology of the pericardial effusion.  His sed rate was 32, so felt low for pericarditis but cardiology could not rule out.  Was placed on colchicine  with plan to complete 36-month duration.  During that hospital admission his EF was estimated 45 to 50%  He had been at the group home since discharge.  Has been in his usual state of health per his caregivers, however staff did notice a new onset of a cough on 8/17.  There is no note change of behavior, no fevers, cough later in the afternoon seemed to subside but then in the a.m. hours on 8/18 he was found by staff with increased work of breathing.  He was moaning, and looked acutely ill per staff and therefore EMS was called.  On arrival pulse oximetry on room air was 75% he was placed on a nonrebreather mask at 15 L and transferred to the emergency room for further evaluation  Diagnostic evaluation in the emergency room identified the following: His respiratory viral panel was negative white blood cell count normal at 7.7 hemoglobin 10.1 BNP was normal at 55.6 troponin I was negative serum bicarbonate 21 serum creatinine 1.21 from baseline of 1.28-1.4, venous arterial blood gas obtained showed pH of 7.38 pCO2 of 39 HCO3 of 23 lactic acid was negative.   A portable chest x-ray was obtained and this showed a large right greater than left pleural effusion.  Bilateral pulmonary edema a follow-up CT angiogram was obtained this was negative for pulmonary embolus but did indeed show large right greater than left pleural effusion, also once again demonstrated a moderate-sized pericardial effusion.  He was placed on noninvasive positive pressure ventilation for his work of breathing.  Cultures were sent, he was administered empiric antibiotics as well as IV Lasix .  Cardiology was consulted given the findings of the CT scan in regards to the pericardial effusion and a stat echocardiogram was completed while in the ER.  Because of his increased work of breathing, and large pleural effusions as well as pericardial effusion, pulmonary was asked to evaluate for #1 appropriateness for hospital admission to the ICU and #2 possibility of therapeutic and diagnostic thoracentesis.  On arrival he was initially fairly agitated on BiPAP while staff was obtaining echocardiogram this is since settled down he is now pending admission  Pertinent  Medical History  Cognitive delay, type 2 diabetes, hypertension, BPH, recent hospital admission for large pericardial effusion with tamponade features requiring drainage  Significant Hospital Events: Including procedures, antibiotic start and stop dates in addition to other pertinent events   8/18 admitted with acute hypoxic respiratory failure, large pericardial effusion, and large right greater than left pleural effusions with compression atelectasis  Interim History / Subjective:  Seems a little more calm since hospital admission  Objective  Blood pressure 115/70, pulse (!) 115, temperature (!) 97.3 F (36.3 C), temperature source Axillary, resp. rate 14, height 5' 8 (1.727 m), weight 58.4 kg, SpO2 100%.    FiO2 (%):  [80 %] 80 % PEEP:  [5 cmH20] 5 cmH20 Pressure Support:  [5 cmH20] 5 cmH20  No intake or output data in the  24 hours ending 04/15/24 1457 Filed Weights   04/15/24 0954  Weight: 58.4 kg    Examination: General: Chronically ill-appearing 73 year old male he is sitting up in the bed.  He is cooperative, not in any distress, he has a care provider at bedside who reports currently at baseline cognitive function.  He is fidgety, shuffling a deck of cards, HENT: Normocephalic atraumatic he has no jugular venous distention he has a BiPAP mask in place Lungs: Decreased both bases.  Portable chest x-ray and CT evaluated.  The CT was negative for pulmonary emboli, he has a large right pleural effusion, moderate size left effusion.  There is cardiomegaly with a moderate to large pericardial effusion.  There is also compressive atelectasis noted Cardiovascular: Regular rate and rhythm, tachycardic Abdomen: Soft nontender Extremities: 1+ edema pulses palpable Neuro: He is awake no focal deficits, baseline cognitive function. GU: He has an external catheter currently draining clear yellow urine  Resolved problem list   Assessment and Plan  Acute hypoxic respiratory failure secondary to large bilateral right greater than left pleural effusions, and Large pericardial effusion.  Known history of heart failure with moderately reduced EF, also prior history of pericardial effusion.   Plan Agree with IV Lasix  Supplemental oxygen, continuing noninvasive positive pressure for now, but hopefully postthoracentesis will be able to get him back to nasal cannula. Proceeding with diagnostic and therapeutic right thoracentesis Will follow-up postprocedural chest x-ray, may be able to wean oxygen after that point Continue empiric antibiotics Sending pleural rheumatoid factor in addition to other diagnostic labs  Recurrent large pericardial effusion.  Previously required pericardial drain in the middle of July.  Etiology was not clear during that hospital stay.  Suspect there is some correlation between both processes in  regards to the pleural effusions and the pericardial effusion Plan Cardiology consulted Follow-up stat echocardiogram Repeat sed rate Send serum rheumatoid factor Send respiratory viral panel Awaiting results of echocardiogram regarding need for possible paracentesis, or pericardial drain Holding his aspirin  pending possible need for pericardiocentesis Admit to the ICU  Heart failure with moderately reduced EF.  EF noted 45 to 50% Hypotension has been barrier to goal-directed medical therapy Plan Telemetry monitoring for now Holding antihypertensives given borderline blood pressure with systolic of 100 Diuresis as able, shooting for euvolemia Additional recommendations per cardiac team  CKD stage 3B.  Looks like baseline creatinine all over the place anywhere from 1.2-1.7 Plan Ensure adequate mean arterial blood pressure Renal dose medications Strict intake output Daily assessment for diuretic needs Serial chemistries  History of BPH Plan Continue home medications  History of type 2 diabetes Plan Holding his metformin for now Sliding scale insulin  as needed  History of hyperlipidemia Plan Continue statin  Cognitive delay, with behavioral disturbance Plan Continue his home risperidone  and sertraline  and Cogentin   Best Practice (right click and Reselect all SmartList Selections daily)   Diet/type: Regular consistency (see orders) DVT prophylaxis SCD Pressure ulcer(s): N/A GI prophylaxis: PPI Lines: N/A Foley:  N/A Code Status:  DNR Last date of multidisciplinary goals of care discussionpending  Labs   CBC: Recent Labs  Lab 04/15/24 1000 04/15/24 1013  WBC 7.7  --   NEUTROABS 6.7  --   HGB 10.1* 10.5*  10.5*  HCT 32.3* 31.0*  31.0*  MCV 91.2  --   PLT 228  --     Basic Metabolic Panel: Recent Labs  Lab 04/15/24 1000 04/15/24 1013  NA 134* 136  135  K 3.8 3.8  3.8  CL 99 101  CO2 21*  --   GLUCOSE 183* 184*  BUN 16 17  CREATININE 1.21  1.10  CALCIUM  8.3*  --    GFR: Estimated Creatinine Clearance: 48.7 mL/min (by C-G formula based on SCr of 1.1 mg/dL). Recent Labs  Lab 04/15/24 1000 04/15/24 1341  WBC 7.7  --   LATICACIDVEN  --  1.7    Liver Function Tests: Recent Labs  Lab 04/15/24 1000  AST 37  ALT 28  ALKPHOS 66  BILITOT 0.6  PROT 6.5  ALBUMIN 2.6*   No results for input(s): LIPASE, AMYLASE in the last 168 hours. No results for input(s): AMMONIA in the last 168 hours.  ABG    Component Value Date/Time   HCO3 23.4 04/15/2024 1013   TCO2 25 04/15/2024 1013   TCO2 22 04/15/2024 1013   ACIDBASEDEF 1.0 04/15/2024 1013   O2SAT 92 04/15/2024 1013     Coagulation Profile: No results for input(s): INR, PROTIME in the last 168 hours.  Cardiac Enzymes: No results for input(s): CKTOTAL, CKMB, CKMBINDEX, TROPONINI in the last 168 hours.  HbA1C: Hgb A1c MFr Bld  Date/Time Value Ref Range Status  11/02/2022 06:20 AM 5.5 4.8 - 5.6 % Final    Comment:    (NOTE)         Prediabetes: 5.7 - 6.4         Diabetes: >6.4         Glycemic control for adults with diabetes: <7.0     CBG: No results for input(s): GLUCAP in the last 168 hours.  Review of Systems:   Not able   Past Medical History:  He,  has a past medical history of Acute UTI (urinary tract infection) (01/01/2023), AKI (acute kidney injury) (HCC) (11/02/2022), Diabetes mellitus, Hypertension, Mental retardation, and Severe sepsis (HCC) (11/02/2022).   Surgical History:   Past Surgical History:  Procedure Laterality Date   PERICARDIOCENTESIS N/A 03/09/2024   Procedure: PERICARDIOCENTESIS;  Surgeon: [place], Kristyn Obyrne M, MD;  Location: [facility] INVASIVE CV LAB;  Service: Cardiovascular;  Laterality: N/A;     Social History:   reports that he quit smoking about 7 years ago. His smoking use included cigarettes. He has never used smokeless tobacco. He reports that he does not drink alcohol and does not use drugs.   Family  History:  His Family history is unknown by patient.   Allergies No Known Allergies   Home Medications  Prior to Admission medications   Medication Sig Start Date End Date Taking? Authorizing Provider  alfuzosin  (UROXATRAL ) 10 MG 24 hr tablet Take 10 mg by mouth daily after breakfast.  03/30/20  Yes [provider]  ALLERGY RELIEF 10 MG tablet Take 10 mg by mouth daily.   Yes [provider]  aspirin  EC 81 MG tablet Take 81 mg by mouth daily.   Yes [provider]  atorvastatin  (LIPITOR) 20 MG tablet Take 20 mg by mouth daily.  02/28/18  Yes [provider]  benztropine  (COGENTIN ) 0.5 MG tablet Take 0.5 mg by mouth daily.    Yes [provider]  colchicine  0.6 MG tablet Take  1 tablet (0.6 mg total) by mouth daily. 03/14/24  Yes Smucker, Melvenia, MD  ferrous sulfate  325 (65 FE) MG tablet Take 325 mg by mouth every Monday, Wednesday, and Friday.  03/27/18  Yes [provider]  ketoconazole (NIZORAL) 2 % shampoo Apply 1 Application topically 2 (two) times a week.   Yes [provider]  metFORMIN (GLUCOPHAGE) 500 MG tablet Take 500 mg by mouth 2 (two) times daily.  01/20/15  Yes [provider]  potassium chloride  (KLOR-CON ) 10 MEQ tablet Take 10 mEq by mouth daily.   Yes [provider]  risperiDONE  (RISPERDAL ) 0.5 MG tablet Take 0.5 mg by mouth daily.   Yes [provider]  risperiDONE  (RISPERDAL ) 3 MG tablet Take 3 mg by mouth at bedtime. 05/16/19  Yes [provider]  sertraline  (ZOLOFT ) 50 MG tablet Take 50 mg by mouth daily.   Yes [provider]  amLODipine  (NORVASC ) 10 MG tablet Take 10 mg by mouth daily.  Patient not taking: Reported on 04/15/2024 05/22/19   [provider]  carvedilol  (COREG ) 6.25 MG tablet Take 1 tablet (6.25 mg total) by mouth 2 (two) times daily with a meal. Patient not taking: Reported on 04/15/2024 01/03/23   Jillian Buttery, MD  ciprofloxacin  (CIPRO ) 500 MG  tablet Take 500 mg by mouth 2 (two) times daily. Patient not taking: Reported on 04/15/2024 03/23/24   [provider]  lisinopril-hydrochlorothiazide (ZESTORETIC) 20-25 MG tablet Take by mouth. Patient not taking: Reported on 04/15/2024    [provider]     Critical care time: 35 min

## 2024-04-15 NOTE — ED Provider Notes (Signed)
 [place] EMERGENCY DEPARTMENT AT [facility] Provider Note  CSN: [id number] Arrival date & time: 04/15/24 0941  Chief Complaint(s) Shortness of Breath  HPI Fred [name] is a 73 y.o. male with past medical history as below, significant for DM, hypertension, CKD, DNR, intellectual disability who presents to the ED with complaint of dyspnea  Patient is a very limited to her story and secondary to intellectual disability.  He resides at group home.  Group home staff reports that he was not looking good this morning and was having difficulty breathing.  On EMS arrival pulse ox was 75% on room air and he was tachycardic, appeared to be in respiratory distress.  Symptoms did improve with nonrebreather 15 L.  Upon arrival patient does have increased work of breathing, he is hypoxic.  His abdomen is distended and he is using accessory muscles.  He has limited verbal responses at baseline.  Difficult to obtain history from patient but he can nod yes or no to questions and somewhat murmur responses but very limited  Past Medical History Past Medical History:  Diagnosis Date   Acute UTI (urinary tract infection) 01/01/2023   AKI (acute kidney injury) (HCC) 11/02/2022   Diabetes mellitus    Hypertension    Mental retardation    Severe sepsis (HCC) 11/02/2022   Patient Active Problem List   Diagnosis Date Noted   Pericardial effusion 01/23/2024   Irregular heart beat 01/23/2024   Contusion of toenail of right foot 02/01/2023   Acute cystitis without hematuria 11/02/2022   Acute metabolic encephalopathy 11/02/2022   Stage 3a chronic kidney disease (CKD) (HCC) - baseline SCr 1.3 11/02/2022   Hypokalemia 11/02/2022   DNR (do not resuscitate)/DNI(Do Not Intubate) 11/02/2022   Xerosis of skin 12/29/2021   Pain due to onychomycosis of toenails of both feet 02/08/2019   Aortic ectasia, abdominal (HCC) 04/23/2018   Anemia 08/06/2017   Elevated prostate specific antigen (PSA)  06/29/2014   Incomplete emptying of bladder 06/29/2014   Urinary incontinence 06/29/2014   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 Authorizing Provider  alfuzosin  (UROXATRAL ) 10 MG 24 hr tablet Take 10 mg by mouth daily after breakfast.  03/30/20  Yes [provider]  ALLERGY RELIEF 10 MG tablet Take 10 mg by mouth daily.   Yes [provider]  aspirin  EC 81 MG tablet Take 81 mg by mouth daily.   Yes [provider]  atorvastatin  (LIPITOR) 20 MG tablet Take 20 mg by mouth daily.  02/28/18  Yes [provider]  benztropine  (COGENTIN ) 0.5 MG tablet Take 0.5 mg by mouth daily.    Yes [provider]  colchicine  0.6 MG tablet Take 1 tablet (0.6 mg total) by mouth daily. 03/14/24  Yes Smucker, Melvenia, MD  ferrous sulfate  325 (65 FE) MG tablet Take 325 mg by mouth every Monday, Wednesday, and Friday.  03/27/18  Yes [provider]  ketoconazole (NIZORAL) 2 % shampoo Apply 1 Application topically 2 (two) times a week.   Yes [provider]  metFORMIN (GLUCOPHAGE) 500 MG tablet Take 500 mg by mouth 2 (two) times daily.  01/20/15  Yes [provider]  potassium chloride  (KLOR-CON ) 10 MEQ tablet Take 10 mEq by mouth daily.  Yes [provider]  risperiDONE  (RISPERDAL ) 0.5 MG tablet Take 0.5 mg by mouth daily.   Yes [provider]  risperiDONE  (RISPERDAL ) 3 MG tablet Take 3 mg by mouth at bedtime. 05/16/19  Yes [provider]  sertraline  (ZOLOFT ) 50 MG tablet Take 50 mg by mouth daily.   Yes [provider]  amLODipine  (NORVASC ) 10 MG tablet Take 10 mg by mouth daily.  Patient not taking: Reported on  04/15/2024 05/22/19   [provider]  carvedilol  (COREG ) 6.25 MG tablet Take 1 tablet (6.25 mg total) by mouth 2 (two) times daily with a meal. Patient not taking: Reported on 04/15/2024 01/03/23   Jillian Buttery, MD  ciprofloxacin  (CIPRO ) 500 MG tablet Take 500 mg by mouth 2 (two) times daily. Patient not taking: Reported on 04/15/2024 03/23/24   [provider]  lisinopril-hydrochlorothiazide (ZESTORETIC) 20-25 MG tablet Take by mouth. Patient not taking: Reported on 04/15/2024    [provider]                                                                                                                                    Past Surgical History Past Surgical History:  Procedure Laterality Date   PERICARDIOCENTESIS N/A 03/09/2024   Procedure: PERICARDIOCENTESIS;  Surgeon: [place], Peter M, MD;  Location: [facility] INVASIVE CV LAB;  Service: Cardiovascular;  Laterality: N/A;   Family History Family History  Family history unknown: Yes    Social History Social History   Tobacco Use   Smoking status: Former    Current packs/day: 0.00    Types: Cigarettes    Quit date: 06/24/2016    Years since quitting: 7.8   Smokeless tobacco: Never  Substance Use Topics   Alcohol use: No   Drug use: No   Allergies Patient has no known allergies.  Review of Systems A thorough review of systems was obtained and all systems are negative except as noted in the HPI and PMH.   Physical Exam Vital Signs  I have reviewed the triage vital signs BP 115/70   Pulse (!) 115   Temp (!) 97.3 F (36.3 C) (Axillary)   Resp 14   Ht 5' 8 (1.727 m)   Wt 58.4 kg   SpO2 100%   BMI 19.58 kg/m  Physical Exam Vitals and nursing note reviewed.  Constitutional:      General: He is in acute distress.     Appearance: He is well-developed.  HENT:     Head: Normocephalic and atraumatic.     Right Ear: External ear normal.     Left Ear: External ear normal.     Mouth/Throat:     Mouth:  Mucous membranes are moist.  Eyes:     General: No scleral icterus. Cardiovascular:     Rate and Rhythm: Normal rate and regular rhythm.     Pulses: Normal pulses.     Heart sounds: Normal  heart sounds.  Pulmonary:     Effort: Tachypnea, accessory muscle usage and respiratory distress present.     Breath sounds: Decreased breath sounds present.  Abdominal:     General: Abdomen is flat. There is distension.     Palpations: Abdomen is soft.     Tenderness: There is no abdominal tenderness.  Musculoskeletal:     Cervical back: No rigidity.     Right lower leg: No edema.     Left lower leg: No edema.  Skin:    General: Skin is warm and dry.     Capillary Refill: Capillary refill takes less than 2 seconds.  Neurological:     Mental Status: He is alert.  Psychiatric:        Mood and Affect: Mood normal.        Behavior: Behavior normal.     ED Results and Treatments Labs (all labs ordered are listed, but only abnormal results are displayed) Labs Reviewed  CBC WITH DIFFERENTIAL/PLATELET - Abnormal; Notable for the following components:      Result Value   RBC 3.54 (*)    Hemoglobin 10.1 (*)    HCT 32.3 (*)    Lymphs Abs 0.3 (*)    All other components within normal limits  COMPREHENSIVE METABOLIC PANEL WITH GFR - Abnormal; Notable for the following components:   Sodium 134 (*)    CO2 21 (*)    Glucose, Bld 183 (*)    Calcium  8.3 (*)    Albumin 2.6 (*)    All other components within normal limits  I-STAT VENOUS BLOOD GAS, ED - Abnormal; Notable for the following components:   pCO2, Ven 39.0 (*)    pO2, Ven 63 (*)    Calcium , Ion 1.12 (*)    HCT 31.0 (*)    Hemoglobin 10.5 (*)    All other components within normal limits  I-STAT CHEM 8, ED - Abnormal; Notable for the following components:   Glucose, Bld 184 (*)    Calcium , Ion 1.07 (*)    Hemoglobin 10.5 (*)    HCT 31.0 (*)    All other components within normal limits  RESP PANEL BY RT-PCR (RSV, FLU A&B, COVID)   RVPGX2  CULTURE, BLOOD (ROUTINE X 2)  CULTURE, BLOOD (ROUTINE X 2)  EXPECTORATED SPUTUM ASSESSMENT W GRAM STAIN, RFLX TO RESP C  BODY FLUID CULTURE W GRAM STAIN  RESPIRATORY PANEL BY PCR  BRAIN NATRIURETIC PEPTIDE  ALBUMIN, PLEURAL OR PERITONEAL FLUID   BODY FLUID CELL COUNT WITH DIFFERENTIAL  GLUCOSE, PLEURAL OR PERITONEAL FLUID  LACTATE DEHYDROGENASE, PLEURAL OR PERITONEAL FLUID  PROTEIN, PLEURAL OR PERITONEAL FLUID  RHEUMATOID FACTOR  MISC [facility] TEST (SEND OUT)  C-REACTIVE PROTEIN  SEDIMENTATION RATE  I-STAT CG4 LACTIC ACID, ED  CYTOLOGY - NON PAP  TROPONIN I (HIGH SENSITIVITY)  TROPONIN I (HIGH SENSITIVITY)  Radiology ECHOCARDIOGRAM LIMITED Result Date: 04/15/2024    ECHOCARDIOGRAM LIMITED REPORT   Patient Name:   Grace Haggart Date of Exam: 04/15/2024 Medical Rec #:  [id number]     Height:       68.0 in Accession #:    [id number]    Weight:       128.7 lb Date of Birth:  April 22, 1951     BSA:          1.695 m Patient Age:    73 years      BP:           102/73 mmHg Patient Gender: M             HR:           110 bpm. Exam Location:  Inpatient Procedure: Limited Echo, Cardiac Doppler and Color Doppler (Both Spectral and            Color Flow Doppler were utilized during procedure). STAT ECHO Indications:    Pericardial Effusion  History:        Patient has prior history of Echocardiogram examinations, most                 recent 03/12/2024. Risk Factors:Hypertension and Diabetes.  Sonographer:    Philomena Daring Referring Phys: ZANE ADAMS IMPRESSIONS  1. Left ventricular ejection fraction, by estimation, is 60 to 65%. The left ventricle has normal function. The left ventricle has no regional wall motion abnormalities. There is moderate concentric left ventricular hypertrophy. Left ventricular diastolic parameters are consistent with Grade I diastolic dysfunction (impaired  relaxation).  2. Right ventricular systolic function is normal. The right ventricular size is normal. Tricuspid regurgitation signal is inadequate for assessing PA pressure.  3. The aortic valve is tricuspid. There is mild calcification of the aortic valve. Aortic valve regurgitation is not visualized. Aortic valve sclerosis/calcification is present, without any evidence of aortic stenosis.  4. The inferior vena cava is dilated in size with >50% respiratory variability, suggesting right atrial pressure of 8 mmHg.  5. There is a left pleural effusion. Moderate circumferential pericardial effusion. There does appear to be >25% respirophasic variation of mitral inflow E velocity. There is indentation of the right atrial free wall but no evidence for RV diastolic indentation/collapse. IVC is dilated but collapses with inspiration. Overall, there appears to be possible early hemodynamic compromise from pericardial effusion (respirophasic variation of mitral E inflow velocity) but not frank tamponade.  6. Limited echo for pericardial effusion. FINDINGS  Left Ventricle: Left ventricular ejection fraction, by estimation, is 60 to 65%. The left ventricle has normal function. The left ventricle has no regional wall motion abnormalities. The left ventricular internal cavity size was normal in size. There is  moderate concentric left ventricular hypertrophy. Left ventricular diastolic parameters are consistent with Grade I diastolic dysfunction (impaired relaxation). Right Ventricle: The right ventricular size is normal. Right ventricular systolic function is normal. Tricuspid regurgitation signal is inadequate for assessing PA pressure. Left Atrium: Left atrial size was normal in size. Right Atrium: Right atrial size was normal in size. Pericardium: There is a left pleural effusion. Moderate circumferential pericardial effusion. There does appear to be >25% respirophasic variation of mitral inflow E velocity. There is  indentation of the right atrial free wall but no evidence for RV diastolic indentation/collapse. IVC is dilated but collapses with inspiration. Overall, there appears to be possible early hemodynamic compromise from pericardial effusion (respirophasic variation of mitral E inflow velocity) but not frank tamponade.  Tricuspid Valve: The tricuspid valve is normal in structure. Tricuspid valve regurgitation is trivial. Aortic Valve: The aortic valve is tricuspid. There is mild calcification of the aortic valve. Aortic valve regurgitation is not visualized. Aortic valve sclerosis/calcification is present, without any evidence of aortic stenosis. Venous: The inferior vena cava is dilated in size with greater than 50% respiratory variability, suggesting right atrial pressure of 8 mmHg. Additional Comments: Spectral Doppler performed. Color Doppler performed.  LEFT VENTRICLE PLAX 2D LVIDd:         4.10 cm   Diastology LVIDs:         2.70 cm   LV e' medial:  7.83 cm/s LV PW:         1.40 cm   LV e' lateral: 9.90 cm/s LV IVS:        1.20 cm LVOT diam:     2.20 cm LVOT Area:     3.80 cm  IVC IVC diam: 2.10 cm LEFT ATRIUM         Index LA diam:    3.10 cm 1.83 cm/m   SHUNTS Systemic Diam: 2.20 cm Dalton McleanMD Electronically signed by Ezra Kanner Signature Date/Time: 04/15/2024/3:45:38 PM    Final    CT Angio Chest PE W and/or Wo Contrast Result Date: 04/15/2024 CLINICAL DATA:  Decreased O2 sat, arrhythmia, abdominal pain. EXAM: CT ANGIOGRAPHY CHEST CT ABDOMEN AND PELVIS WITH CONTRAST TECHNIQUE: Multidetector CT imaging of the chest was performed using the standard protocol during bolus administration of intravenous contrast. Multiplanar CT image reconstructions and MIPs were obtained to evaluate the vascular anatomy. Multidetector CT imaging of the abdomen and pelvis was performed using the standard protocol during bolus administration of intravenous contrast. RADIATION DOSE REDUCTION: This exam was performed  according to the departmental dose-optimization program which includes automated exposure control, adjustment of the mA and/or kV according to patient size and/or use of iterative reconstruction technique. CONTRAST:  75mL OMNIPAQUE  IOHEXOL  350 MG/ML SOLN COMPARISON:  CT abdomen pelvis 01/03/2024. FINDINGS: CTA CHEST FINDINGS Cardiovascular: Image quality is degraded by respiratory motion. Negative for pulmonary embolus. Atherosclerotic calcification of the aorta and coronary arteries. Enlarged pulmonic trunk and heart. Large pericardial effusion. Mediastinum/Nodes: Mediastinal lymph nodes measure up to 11 mm high right paratracheal station (3/21). Subcarinal lymph node measures 1.9 cm. Bihilar lymph nodes measure up to 12 mm on the left. Small axillary lymph nodes bilaterally. Air in the esophagus can be seen with dysmotility. Lungs/Pleura: Image quality is markedly degraded by respiratory motion and expiratory phase imaging. Large loculated right pleural effusion with collapse/consolidation in the posterior segment right upper lobe, right middle lobe and right lower lobe. Moderate to large left pleural effusion with collapse/consolidation in the left lower lobe. Airway is unremarkable. Musculoskeletal: Degenerative changes in the spine. Review of the MIP images confirms the above findings. CT ABDOMEN and PELVIS FINDINGS Hepatobiliary: Liver and gallbladder are unremarkable. No biliary ductal dilatation. Pancreas: Negative. Spleen: Negative. Adrenals/Urinary Tract: Adrenal glands are unremarkable. Low-attenuation lesions in the kidneys. No specific follow-up necessary. Ureters are decompressed. Bladder is grossly unremarkable. Stomach/Bowel: Stomach, small bowel and appendix are unremarkable. Moderate to severe stool burden. Vascular/Lymphatic: Atherosclerotic calcification of the aorta. No pathologically enlarged lymph nodes. Reproductive: Prostate is slightly prominent. Other: No free fluid.  Mesenteries and  peritoneum are unremarkable. Musculoskeletal: Degenerative changes in the spine. Review of the MIP images confirms the above findings. IMPRESSION: 1. Negative for pulmonary embolus. 2. Large loculated right pleural effusion with compressive atelectasis in the right upper, right  middle and right lower lobes. Moderate to large left pleural effusion with atelectasis in the left lower lobe. Difficult to exclude pneumonia. 3. Borderline mediastinal hilar adenopathy, possibly reactive in etiology. Difficult to exclude malignancy. Consider follow-up CT chest with contrast in 2-3 months, as clinically indicated. 4. Large pericardial effusion. 5. No acute findings in the abdomen or pelvis. 6. Moderate to severe stool burden. 7. Aortic atherosclerosis (ICD10-I70.0). Coronary artery calcification. 8. Enlarged pulmonic trunk, indicative of pulmonary arterial hypertension. Electronically Signed   By: Newell Eke M.D.   On: 04/15/2024 12:46   CT ABDOMEN PELVIS W CONTRAST Result Date: 04/15/2024 CLINICAL DATA:  Decreased O2 sat, arrhythmia, abdominal pain. EXAM: CT ANGIOGRAPHY CHEST CT ABDOMEN AND PELVIS WITH CONTRAST TECHNIQUE: Multidetector CT imaging of the chest was performed using the standard protocol during bolus administration of intravenous contrast. Multiplanar CT image reconstructions and MIPs were obtained to evaluate the vascular anatomy. Multidetector CT imaging of the abdomen and pelvis was performed using the standard protocol during bolus administration of intravenous contrast. RADIATION DOSE REDUCTION: This exam was performed according to the departmental dose-optimization program which includes automated exposure control, adjustment of the mA and/or kV according to patient size and/or use of iterative reconstruction technique. CONTRAST:  75mL OMNIPAQUE  IOHEXOL  350 MG/ML SOLN COMPARISON:  CT abdomen pelvis 01/03/2024. FINDINGS: CTA CHEST FINDINGS Cardiovascular: Image quality is degraded by respiratory  motion. Negative for pulmonary embolus. Atherosclerotic calcification of the aorta and coronary arteries. Enlarged pulmonic trunk and heart. Large pericardial effusion. Mediastinum/Nodes: Mediastinal lymph nodes measure up to 11 mm high right paratracheal station (3/21). Subcarinal lymph node measures 1.9 cm. Bihilar lymph nodes measure up to 12 mm on the left. Small axillary lymph nodes bilaterally. Air in the esophagus can be seen with dysmotility. Lungs/Pleura: Image quality is markedly degraded by respiratory motion and expiratory phase imaging. Large loculated right pleural effusion with collapse/consolidation in the posterior segment right upper lobe, right middle lobe and right lower lobe. Moderate to large left pleural effusion with collapse/consolidation in the left lower lobe. Airway is unremarkable. Musculoskeletal: Degenerative changes in the spine. Review of the MIP images confirms the above findings. CT ABDOMEN and PELVIS FINDINGS Hepatobiliary: Liver and gallbladder are unremarkable. No biliary ductal dilatation. Pancreas: Negative. Spleen: Negative. Adrenals/Urinary Tract: Adrenal glands are unremarkable. Low-attenuation lesions in the kidneys. No specific follow-up necessary. Ureters are decompressed. Bladder is grossly unremarkable. Stomach/Bowel: Stomach, small bowel and appendix are unremarkable. Moderate to severe stool burden. Vascular/Lymphatic: Atherosclerotic calcification of the aorta. No pathologically enlarged lymph nodes. Reproductive: Prostate is slightly prominent. Other: No free fluid.  Mesenteries and peritoneum are unremarkable. Musculoskeletal: Degenerative changes in the spine. Review of the MIP images confirms the above findings. IMPRESSION: 1. Negative for pulmonary embolus. 2. Large loculated right pleural effusion with compressive atelectasis in the right upper, right middle and right lower lobes. Moderate to large left pleural effusion with atelectasis in the left lower lobe.  Difficult to exclude pneumonia. 3. Borderline mediastinal hilar adenopathy, possibly reactive in etiology. Difficult to exclude malignancy. Consider follow-up CT chest with contrast in 2-3 months, as clinically indicated. 4. Large pericardial effusion. 5. No acute findings in the abdomen or pelvis. 6. Moderate to severe stool burden. 7. Aortic atherosclerosis (ICD10-I70.0). Coronary artery calcification. 8. Enlarged pulmonic trunk, indicative of pulmonary arterial hypertension. Electronically Signed   By: Newell Eke M.D.   On: 04/15/2024 12:46   DG Chest Port 1 View Result Date: 04/15/2024 CLINICAL DATA:  Dyspnea EXAM: PORTABLE CHEST - 1 VIEW  COMPARISON:  March 07, 2024 FINDINGS: Low lung volumes. Diffuse bilateral perihilar interstitial opacities. Small to moderate volume right and small left pleural effusions. No pneumothorax. Moderate cardiomegaly. Aortic atherosclerosis. No acute fracture or destructive lesions. Multilevel thoracic osteophytosis. IMPRESSION: Cardiomegaly with findings of interstitial edema. Bilateral pleural effusions, small to moderate on the right and small on the left. Electronically Signed   By: Rogelia Myers M.D.   On: 04/15/2024 11:28    Pertinent labs & imaging results that were available during my care of the patient were reviewed by me and considered in my medical decision making (see MDM for details).  Medications Ordered in ED Medications  furosemide  (LASIX ) injection 40 mg (has no administration in time range)  ipratropium-albuterol  (DUONEB) 0.5-2.5 (3) MG/3ML nebulizer solution 3 mL (3 mLs Nebulization Given 04/15/24 1008)  furosemide  (LASIX ) injection 80 mg (80 mg Intravenous Given 04/15/24 1251)  iohexol  (OMNIPAQUE ) 350 MG/ML injection 75 mL (75 mLs Intravenous Contrast Given 04/15/24 1220)  cefTRIAXone  (ROCEPHIN ) 2 g in sodium chloride  0.9 % 100 mL IVPB (0 g Intravenous Stopped 04/15/24 1420)  azithromycin  (ZITHROMAX ) 500 mg in sodium chloride  0.9 % 250 mL IVPB  (500 mg Intravenous New Bag/Given 04/15/24 1433)                                                                                                                                     Procedures .Critical Care  Performed by: Elnor Jayson LABOR, DO Authorized by: Elnor Jayson LABOR, DO   Critical care provider statement:    Critical care time (minutes):  50   Critical care time was exclusive of:  Separately billable procedures and treating other patients   Critical care was necessary to treat or prevent imminent or life-threatening deterioration of the following conditions:  Cardiac failure and respiratory failure   Critical care was time spent personally by me on the following activities:  Development of treatment plan with patient or surrogate, discussions with consultants, evaluation of patient's response to treatment, examination of patient, ordering and review of laboratory studies, ordering and review of radiographic studies, ordering and performing treatments and interventions, pulse oximetry, re-evaluation of patient's condition, review of old charts and obtaining history from patient or surrogate   Care discussed with: admitting provider   Ultrasound ED Echo  Date/Time: 04/15/2024 1:05 PM  Performed by: Elnor Jayson LABOR, DO Authorized by: Elnor Jayson LABOR, DO   Procedure details:    Indications: dyspnea     Views: parasternal long axis view, parasternal short axis view and apical 4 chamber view     Images: archived     Limitations:  Positioning Findings:    Pericardium: moderate pericardial effusion   Impression:    Impression: pericardial effusion present     (including critical care time)  Medical Decision Making / ED Course    Medical Decision Making:    Corneilus Heggie is a 73 y.o. male with  past medical history as below, significant for DM, hypertension, CKD, DNR, intellectual disability who presents to the ED with complaint of dyspnea. The complaint involves an extensive  differential diagnosis and also carries with it a high risk of complications and morbidity.  Serious etiology was considered. Ddx includes but is not limited to: In my evaluation of this patient's dyspnea my DDx includes, but is not limited to, pneumonia, pulmonary embolism, pneumothorax, pulmonary edema, metabolic acidosis, asthma, COPD, cardiac cause, anemia, anxiety, etc.    Complete initial physical exam performed, notably the patient was in acute respiratory distress, pulse ox 87% on 15 L nonrebreather.    Reviewed and confirmed nursing documentation for past medical history, family history, social history.  Vital signs reviewed.    Acute respiratory failure with hypoxia > - Pulse ox 75% on room air, improved on nonrebreather.  Start BiPAP - Bedside echo shows pericardial effusion also B-lines, pleural effusion; concern for volume overload, concern for acute CHF  - Respiratory status improved on BiPAP, started on diuresis - resp status improving on BIPAP, improved WOB  Pericardial effusion > - Recurrent  - Does not appear to have tamponade today, he was admitted last month with pericardial effusion status post pericardiocentesis, presumed pericarditis, started on colchicine  - consulted cardiology, they will come eval  Bilateral Pleural effusion> - Bilateral pleural eff w/ ?underlying PNA - POCUS with B-lines b/l - Lasix  80mg  x1 - Would likely benefit from thoracentesis - PCCM consulted  Sepsis Pneumonia > - Possible underlying pneumonia, he has no fever, no WBC elevation, he has cough but non-productive.  - Does have respiratory failure with hypoxia, abnormal vital signs; does meet criteria for sepsis.  Started on Rocephin  azithromycin .  Does not appear to be septic shock as he is HDS, LA not elev   Clinical Course as of 04/15/24 1547  Mon Apr 15, 2024  1303 Source of infection identified, ?PNA, code sepsis, HDS. Hold off 30cc/kg bolus as pt hemodynamically stable and has  clinically significant volume overload / pericardial effusion [SG]    Clinical Course User Index [SG] Elnor Jayson LABOR, DO     Plan to admit for the above, PCCM will take his primary                Additional history obtained: -Additional history obtained from EMS -External records from outside source obtained and reviewed including: Chart review including previous notes, labs, imaging, consultation notes including  Recent admission, prior labs and imaging   Lab Tests: -I ordered, reviewed, and interpreted labs.   The pertinent results include:   Labs Reviewed  CBC WITH DIFFERENTIAL/PLATELET - Abnormal; Notable for the following components:      Result Value   RBC 3.54 (*)    Hemoglobin 10.1 (*)    HCT 32.3 (*)    Lymphs Abs 0.3 (*)    All other components within normal limits  COMPREHENSIVE METABOLIC PANEL WITH GFR - Abnormal; Notable for the following components:   Sodium 134 (*)    CO2 21 (*)    Glucose, Bld 183 (*)    Calcium  8.3 (*)    Albumin 2.6 (*)    All other components within normal limits  I-STAT VENOUS BLOOD GAS, ED - Abnormal; Notable for the following components:   pCO2, Ven 39.0 (*)    pO2, Ven 63 (*)    Calcium , Ion 1.12 (*)    HCT 31.0 (*)    Hemoglobin 10.5 (*)    All other components  within normal limits  I-STAT CHEM 8, ED - Abnormal; Notable for the following components:   Glucose, Bld 184 (*)    Calcium , Ion 1.07 (*)    Hemoglobin 10.5 (*)    HCT 31.0 (*)    All other components within normal limits  RESP PANEL BY RT-PCR (RSV, FLU A&B, COVID)  RVPGX2  CULTURE, BLOOD (ROUTINE X 2)  CULTURE, BLOOD (ROUTINE X 2)  EXPECTORATED SPUTUM ASSESSMENT W GRAM STAIN, RFLX TO RESP C  BODY FLUID CULTURE W GRAM STAIN  RESPIRATORY PANEL BY PCR  BRAIN NATRIURETIC PEPTIDE  ALBUMIN, PLEURAL OR PERITONEAL FLUID   BODY FLUID CELL COUNT WITH DIFFERENTIAL  GLUCOSE, PLEURAL OR PERITONEAL FLUID  LACTATE DEHYDROGENASE, PLEURAL OR PERITONEAL FLUID   PROTEIN, PLEURAL OR PERITONEAL FLUID  RHEUMATOID FACTOR  MISC [facility] TEST (SEND OUT)  C-REACTIVE PROTEIN  SEDIMENTATION RATE  I-STAT CG4 LACTIC ACID, ED  CYTOLOGY - NON PAP  TROPONIN I (HIGH SENSITIVITY)  TROPONIN I (HIGH SENSITIVITY)    Notable for labs stable so far  EKG   EKG Interpretation Date/Time:  Monday April 15 2024 10:06:36 EDT Ventricular Rate:  115 PR Interval:  149 QRS Duration:  80 QT Interval:  341 QTC Calculation: 472 R Axis:   6  Text Interpretation: Sinus tachycardia Borderline low voltage, extremity leads Probable anteroseptal infarct, old Nonspecific T abnormalities, lateral leads Confirmed by Elnor Savant ([id number]) on 04/15/2024 12:27:32 PM         Imaging Studies ordered: I ordered imaging studies including CT PE, CT abdomen pelvis, chest x-ray I independently visualized the following imaging with scope of interpretation limited to determining acute life threatening conditions related to emergency care; findings noted above I agree with the radiologist interpretation If any imaging was obtained with contrast I closely monitored patient for any possible adverse reaction a/w contrast administration in the emergency department   Medicines ordered and prescription drug management: Meds ordered this encounter  Medications   ipratropium-albuterol  (DUONEB) 0.5-2.5 (3) MG/3ML nebulizer solution 3 mL   furosemide  (LASIX ) injection 80 mg   iohexol  (OMNIPAQUE ) 350 MG/ML injection 75 mL   cefTRIAXone  (ROCEPHIN ) 2 g in sodium chloride  0.9 % 100 mL IVPB    Antibiotic Indication::   CAP   azithromycin  (ZITHROMAX ) 500 mg in sodium chloride  0.9 % 250 mL IVPB    Antibiotic Indication::   CAP   furosemide  (LASIX ) injection 40 mg    -I have reviewed the patients home medicines and have made adjustments as needed   Consultations Obtained: I requested consultation with the cardiology, PCCM,  and discussed lab and imaging findings as well as pertinent plan - they  recommend: admit   Cardiac Monitoring: The patient was maintained on a cardiac monitor.  I personally viewed and interpreted the cardiac monitored which showed an underlying rhythm of: Sinus tachycardia Continuous pulse oximetry interpreted by myself, 85% % on 15 L nonrebreather >> 97% on BiPAP.    Social Determinants of Health:  Diagnosis or treatment significantly limited by social determinants of health: former smoker, intellectual disability   Reevaluation: After the interventions noted above, I reevaluated the patient and found that they have improved  Co morbidities that complicate the patient evaluation  Past Medical History:  Diagnosis Date   Acute UTI (urinary tract infection) 01/01/2023   AKI (acute kidney injury) (HCC) 11/02/2022   Diabetes mellitus    Hypertension    Mental retardation    Severe sepsis (HCC) 11/02/2022      Dispostion: Disposition decision  including need for hospitalization was considered, and patient admitted to the hospital.    Final Clinical Impression(s) / ED Diagnoses Final diagnoses:  Acute respiratory failure with hypoxia (HCC)  Pericardial effusion  Acute congestive heart failure, unspecified heart failure type (HCC)  Pleural effusion  Community acquired pneumonia, unspecified laterality  Sepsis, due to unspecified organism, unspecified whether acute organ dysfunction present (HCC)        Elnor Jayson LABOR, DO 04/15/24 1547

## 2024-04-15 NOTE — Sepsis Progress Note (Signed)
 Elink monitoring for the code sepsis protocol.

## 2024-04-15 NOTE — ED Notes (Signed)
 CT called by RN to determine time available for scan. This RN left contact info for CT when scanner is available

## 2024-04-16 ENCOUNTER — Inpatient Hospital Stay (HOSPITAL_COMMUNITY)

## 2024-04-16 DIAGNOSIS — E785 Hyperlipidemia, unspecified: Secondary | ICD-10-CM

## 2024-04-16 DIAGNOSIS — N1832 Chronic kidney disease, stage 3b: Secondary | ICD-10-CM

## 2024-04-16 DIAGNOSIS — E1159 Type 2 diabetes mellitus with other circulatory complications: Secondary | ICD-10-CM

## 2024-04-16 DIAGNOSIS — Z9889 Other specified postprocedural states: Secondary | ICD-10-CM

## 2024-04-16 DIAGNOSIS — I3139 Other pericardial effusion (noninflammatory): Secondary | ICD-10-CM

## 2024-04-16 DIAGNOSIS — I5020 Unspecified systolic (congestive) heart failure: Secondary | ICD-10-CM

## 2024-04-16 DIAGNOSIS — I10 Essential (primary) hypertension: Secondary | ICD-10-CM

## 2024-04-16 DIAGNOSIS — I5023 Acute on chronic systolic (congestive) heart failure: Secondary | ICD-10-CM

## 2024-04-16 LAB — BASIC METABOLIC PANEL WITH GFR
Anion gap: 11 (ref 5–15)
BUN: 19 mg/dL (ref 8–23)
CO2: 23 mmol/L (ref 22–32)
Calcium: 8.2 mg/dL — ABNORMAL LOW (ref 8.9–10.3)
Chloride: 105 mmol/L (ref 98–111)
Creatinine, Ser: 1.46 mg/dL — ABNORMAL HIGH (ref 0.61–1.24)
GFR, Estimated: 50 mL/min — ABNORMAL LOW (ref >60)
Glucose, Bld: 84 mg/dL (ref 70–99)
Potassium: 3.7 mmol/L (ref 3.5–5.1)
Sodium: 139 mmol/L (ref 135–145)

## 2024-04-16 LAB — SEDIMENTATION RATE: Sed Rate: 55 mm/h — ABNORMAL HIGH (ref 0–16)

## 2024-04-16 LAB — CBC
HCT: 28.1 % — ABNORMAL LOW (ref 39.0–52.0)
Hemoglobin: 9.2 g/dL — ABNORMAL LOW (ref 13.0–17.0)
MCH: 28.7 pg (ref 26.0–34.0)
MCHC: 32.7 g/dL (ref 30.0–36.0)
MCV: 87.5 fL (ref 80.0–100.0)
Platelets: 251 K/uL (ref 150–400)
RBC: 3.21 MIL/uL — ABNORMAL LOW (ref 4.22–5.81)
RDW: 14.1 % (ref 11.5–15.5)
WBC: 7.9 K/uL (ref 4.0–10.5)
nRBC: 0 % (ref 0.0–0.2)

## 2024-04-16 LAB — GLUCOSE, CAPILLARY
Glucose-Capillary: 81 mg/dL (ref 70–99)
Glucose-Capillary: 88 mg/dL (ref 70–99)
Glucose-Capillary: 146 mg/dL — ABNORMAL HIGH (ref 70–99)
Glucose-Capillary: 117 mg/dL — ABNORMAL HIGH (ref 70–99)
Glucose-Capillary: 193 mg/dL — ABNORMAL HIGH (ref 70–99)

## 2024-04-16 LAB — RHEUMATOID FACTOR: Rheumatoid fact SerPl-aCnc: 24.2 [IU]/mL — ABNORMAL HIGH (ref <14.0)

## 2024-04-16 LAB — PROCALCITONIN: Procalcitonin: 0.81 ng/mL

## 2024-04-16 LAB — FERRITIN: Ferritin: 221 ng/mL (ref 24–336)

## 2024-04-16 LAB — C-REACTIVE PROTEIN: CRP: 14.4 mg/dL — ABNORMAL HIGH (ref <1.0)

## 2024-04-16 MED ORDER — HEPARIN SODIUM (PORCINE) 5000 UNIT/ML IJ SOLN: 5000.0000 [IU] | Freq: Three times a day (TID) | INTRAMUSCULAR | Status: DC

## 2024-04-16 MED ORDER — POTASSIUM CHLORIDE CRYS ER 20 MEQ PO TBCR
40.0000 meq | EXTENDED_RELEASE_TABLET | Freq: Once | ORAL | Status: AC
  Administered 2024-04-16: 40 meq via ORAL
  Filled 2024-04-16: qty 2

## 2024-04-16 MED ORDER — HEPARIN SODIUM (PORCINE) 5000 UNIT/ML IJ SOLN
5000.0000 [IU] | Freq: Three times a day (TID) | INTRAMUSCULAR | Status: DC
  Administered 2024-04-16 – 2024-04-20 (×12): 5000 [IU] via SUBCUTANEOUS
  Filled 2024-04-16 (×12): qty 1

## 2024-04-16 MED ORDER — SODIUM CHLORIDE 0.9 % IV SOLN
2.0000 g | INTRAVENOUS | Status: AC
  Administered 2024-04-16 – 2024-04-19 (×4): 2 g via INTRAVENOUS
  Filled 2024-04-16 (×3): qty 20

## 2024-04-16 NOTE — Plan of Care (Signed)
  Problem: Education: Goal: Ability to describe self-care measures that may prevent or decrease complications (Diabetes Survival Skills Education) will improve Outcome: Progressing   Problem: Fluid Volume: Goal: Ability to maintain a balanced intake and output will improve Outcome: Progressing   Problem: Coping: Goal: Ability to adjust to condition or change in health will improve Outcome: Progressing   Problem: Health Behavior/Discharge Planning: Goal: Ability to identify and utilize available resources and services will improve Outcome: Progressing   Problem: Health Behavior/Discharge Planning: Goal: Ability to manage health-related needs will improve Outcome: Progressing   Problem: Clinical Measurements: Goal: Cardiovascular complication will be avoided Outcome: Progressing   Problem: Clinical Measurements: Goal: Respiratory complications will improve Outcome: Progressing   Problem: Clinical Measurements: Goal: Diagnostic test results will improve Outcome: Progressing

## 2024-04-16 NOTE — TOC Initial Note (Signed)
 Transition of Care [facility]) - Initial/Assessment Note    Patient Details  Name: Fred [name] MRN: [id number] Date of Birth: 08-23-1951  Transition of Care [facility]) CM/SW Contact:    Lauraine FORBES Saa, LCSWA Phone Number: 04/16/2024, 1:43 PM  Clinical Narrative:                  1:43 PM  CSW introduced self and role to patient's sister, [name] (patient has IDD). [name] confirmed patient resides at group home [facility]) where he receives 24/7 PCS through [facility]. [name] confirmed patient HH history with [facility] and [facility]. [name] was agreeable with using [facility] or [facility] if [facility] was recommended. [name] confirmed patient DME (rolling walker, shower stool) history but stated that shower stool was private pay. [name] confirmed patient does not have SNF history. Per chart review, patient has a PCP and insurance. Patient's listed preferred pharmacy's are [facility], [facility] [place], and [address] [facility] [place], [place].   Expected Discharge Plan: Group Home Barriers to Discharge: Continued Medical Work up   Patient Goals and CMS Choice            Expected Discharge Plan and Services       Living arrangements for the past 2 months: Group Home                                      Prior Living Arrangements/Services Living arrangements for the past 2 months: Group Home Lives with:: Facility Resident Patient language and need for interpreter reviewed:: Yes        Need for Family Participation in Patient Care: Yes (Comment) Care giver support system in place?: Yes (comment) Current home services: DME, Other (comment) (PCS) Criminal Activity/Legal Involvement Pertinent to Current Situation/Hospitalization: No - Comment as needed  Activities of Daily Living      Permission Sought/Granted Permission sought to share information with : Family Supports, Oceanographer granted to share  information with : No (Contact information on chart)  Share Information with NAME: [name] Daring  Permission granted to share info w AGENCY: [facility]  Permission granted to share info w Relationship: Sister  Permission granted to share info w Contact Information: [phone]  Emotional Assessment         Alcohol / Substance Use: Not Applicable Psych Involvement: No (comment)  Admission diagnosis:  Pericardial effusion [I31.39] Respiratory failure, acute (HCC) [J96.00] Pleural effusion [J90] Acute respiratory failure with hypoxia (HCC) [J96.01] Community acquired pneumonia, unspecified laterality [J18.9] Acute congestive heart failure, unspecified heart failure type (HCC) [I50.9] Sepsis, due to unspecified organism, unspecified whether acute organ dysfunction present [facility]) [A41.9] Patient Active Problem List   Diagnosis Date Noted   Respiratory failure, acute (HCC) 04/15/2024   Pericardial effusion 01/23/2024   Irregular heart beat 01/23/2024   Contusion of toenail of right foot 02/01/2023   Acute cystitis without hematuria 11/02/2022   Acute metabolic encephalopathy 11/02/2022   Stage 3a chronic kidney disease (CKD) (HCC) - baseline SCr 1.3 11/02/2022   Hypokalemia 11/02/2022   DNR (do not resuscitate)/DNI(Do Not Intubate) 11/02/2022   Xerosis of skin 12/29/2021   Pain due to onychomycosis of toenails of both feet 02/08/2019   Aortic ectasia, abdominal (HCC) 04/23/2018   Anemia 08/06/2017   Elevated prostate specific antigen (PSA) 06/29/2014   Incomplete emptying of bladder 06/29/2014   Urinary incontinence 06/29/2014  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  [place] [address] Phone: [phone] Fax: [phone]  [facility] - [place], [place] - [address] [place] [address] Phone: [phone] Fax: [phone]  [facility] [address] [place] [address] Phone: [phone] Fax: [phone]     Social Drivers of Health (SDOH) Social History: SDOH Screenings   Food Insecurity: No Food Insecurity (03/20/2024)   [facility]  Housing: Low Risk  (03/20/2024)   Received from [facility]  Transportation Needs: No Transportation Needs (03/20/2024)   [facility] from [facility]  Utilities: Not At Risk (03/20/2024)   Received from [facility]  Financial Resource Strain: Low Risk  (03/20/2024)   Received from [facility]  Physical Activity: Unknown (04/26/2023)   Received [facility]  Social Connections: Patient Unable To Answer (03/08/2024)  Stress: No Stress Concern Present (04/26/2023)   Received from [facility]  Tobacco Use: Medium Risk (03/20/2024)   Received from [facility]   SDOH Interventions:     Readmission Risk Interventions     No data to display

## 2024-04-16 NOTE — Evaluation (Signed)
 Clinical/Bedside Swallow Evaluation Patient Details  Name: Fred [name] MRN: [id number] Date of Birth: 05-23-1951  Today's Date: 04/16/2024 Time: SLP Start Time (ACUTE ONLY): 1100 SLP Stop Time (ACUTE ONLY): 1120 SLP Time Calculation (min) (ACUTE ONLY): 20 min  Past Medical History:  Past Medical History:  Diagnosis Date   Acute UTI (urinary tract infection) 01/01/2023   AKI (acute kidney injury) (HCC) 11/02/2022   Diabetes mellitus    Hypertension    Mental retardation    Severe sepsis (HCC) 11/02/2022   Past Surgical History:  Past Surgical History:  Procedure Laterality Date   PERICARDIOCENTESIS N/A 03/09/2024   Procedure: PERICARDIOCENTESIS;  Surgeon: [place], Peter M, MD;  Location: [facility] INVASIVE CV LAB;  Service: Cardiovascular;  Laterality: N/A;   HPI:  Pt is a 73 year old male admitted from a group home, hx of developmental cognitive impairment. On 8/18 admitted with acute hypoxic respiratory failure, pericardial effusion, and large right greater than left pleural effusions with compression atelectasis. Pt has a history of type 2 diabetes, hypertension, BPH, recent hospital admission for large pericardial effusion with tamponade features requiring drainage.    Assessment / Plan / Recommendation  Clinical Impression  Pt demonstrates no coughing or signs of aspiration. He does feed himelf very quickly and loads his mouth while still masticating solids. Pts sister reports safety if improved with finely chopped/ground IDDSI 5 diet textures. WIll modify diet and sign off. SLP Visit Diagnosis: Dysphagia, unspecified (R13.10)    Aspiration Risk       Diet Recommendation Dysphagia 2 (Fine chop);Thin liquid    Liquid Administration via: Cup;Straw Medication Administration: Whole meds with liquid Supervision: Patient able to self feed Compensations: Slow rate;Small sips/bites Postural Changes: Seated upright at 90 degrees      Swallow Study   General HPI: Pt is a 73 year old  male admitted from a group home, hx of developmental cognitive impairment. On 8/18 admitted with acute hypoxic respiratory failure, pericardial effusion, and large right greater than left pleural effusions with compression atelectasis. Pt has a history of type 2 diabetes, hypertension, BPH, recent hospital admission for large pericardial effusion with tamponade features requiring drainage. Type of Study: Bedside Swallow Evaluation Previous Swallow Assessment: none Diet Prior to this Study: Regular;Thin liquids (Level 0) Temperature Spikes Noted: No Respiratory Status: Room air History of Recent Intubation: No Behavior/Cognition: Alert;Cooperative;Pleasant mood Oral Cavity Assessment: Within Functional Limits Oral Care Completed by SLP: No Oral Cavity - Dentition: Missing dentition Vision: Functional for self-feeding Self-Feeding Abilities: Able to feed self Patient Positioning: Upright in bed Baseline Vocal Quality: Low vocal intensity Volitional Cough: Cognitively unable to elicit Volitional Swallow: Able to elicit    Oral/Motor/Sensory Function Overall Oral Motor/Sensory Function: Within functional limits   Ice Chips     Thin Liquid Thin Liquid: Within functional limits Presentation: Straw;Self Fed    Nectar Thick Nectar Thick Liquid: Not tested   Honey Thick Honey Thick Liquid: Not tested   Puree Puree: Within functional limits   Solid     Solid: Impaired Oral Phase Functional Implications: Other (comment) Other Comments: impulsive oral packing      Berenise Hunton, Consuelo Fitch 04/16/2024,1:43 PM

## 2024-04-16 NOTE — Plan of Care (Signed)
  Problem: Fluid Volume: Goal: Ability to maintain a balanced intake and output will improve Outcome: Progressing   Problem: Health Behavior/Discharge Planning: Goal: Ability to identify and utilize available resources and services will improve Outcome: Progressing   Problem: Nutritional: Goal: Maintenance of adequate nutrition will improve Outcome: Progressing Goal: Progress toward achieving an optimal weight will improve Outcome: Progressing   Problem: Nutrition: Goal: Adequate nutrition will be maintained Outcome: Progressing   Problem: Pain Managment: Goal: General experience of comfort will improve and/or be controlled Outcome: Progressing   Problem: Skin Integrity: Goal: Risk for impaired skin integrity will decrease Outcome: Progressing

## 2024-04-16 NOTE — Progress Notes (Signed)
 NAME:  Fred [name], MRN:  [id number], DOB:  11/24/1950, LOS: 1 ADMISSION DATE:  04/15/2024, CONSULTATION DATE:  8/18 REFERRING MD:  Elnor, CHIEF COMPLAINT:  acute respiratory failure    History of Present Illness:  This is a 73 year old male patient who resides at a group home.  At baseline he is ambulatory without oxygen, requires assistance with ADLs, not able to manage household.  Speaks in 1-2 word phrases.  Recently discharged on 7/16 after hospital admission where he was admitted for initially shortness of breath and leg swelling, found to have a large pericardial effusion with early tamponade physiology.  Ended up He underwent pericardial drain placement on 7/12.  Diagnostics were inconclusive as to etiology of the pericardial effusion.  His sed rate was 32, so felt low for pericarditis but cardiology could not rule out.  Was placed on colchicine  with plan to complete 67-month duration.  During that hospital admission his EF was estimated 45 to 50%  He had been at the group home since discharge.  Has been in his usual state of health per his caregivers, however staff did notice a new onset of a cough on 8/17.  There is no note change of behavior, no fevers, cough later in the afternoon seemed to subside but then in the a.m. hours on 8/18 he was found by staff with increased work of breathing.  He was moaning, and looked acutely ill per staff and therefore EMS was called.  On arrival pulse oximetry on room air was 75% he was placed on a nonrebreather mask at 15 L and transferred to the emergency room for further evaluation  Diagnostic evaluation in the emergency room identified the following: His respiratory viral panel was negative white blood cell count normal at 7.7 hemoglobin 10.1 BNP was normal at 55.6 troponin I was negative serum bicarbonate 21 serum creatinine 1.21 from baseline of 1.28-1.4, venous arterial blood gas obtained showed pH of 7.38 pCO2 of 39 HCO3 of 23 lactic acid was negative.   A portable chest x-ray was obtained and this showed a large right greater than left pleural effusion.  Bilateral pulmonary edema a follow-up CT angiogram was obtained this was negative for pulmonary embolus but did indeed show large right greater than left pleural effusion, also once again demonstrated a moderate-sized pericardial effusion.  He was placed on noninvasive positive pressure ventilation for his work of breathing.  Cultures were sent, he was administered empiric antibiotics as well as IV Lasix .  Cardiology was consulted given the findings of the CT scan in regards to the pericardial effusion and a stat echocardiogram was completed while in the ER.  Because of his increased work of breathing, and large pleural effusions as well as pericardial effusion, pulmonary was asked to evaluate for #1 appropriateness for hospital admission to the ICU and #2 possibility of therapeutic and diagnostic thoracentesis.  On arrival he was initially fairly agitated on BiPAP while staff was obtaining echocardiogram this is since settled down.   Pertinent  Medical History  Cognitive delay, type 2 diabetes, hypertension, BPH, recent hospital admission for large pericardial effusion with tamponade features requiring drainage  Significant Hospital Events: Including procedures, antibiotic start and stop dates in addition to other pertinent events   8/18 admitted with acute hypoxic respiratory failure, pericardial effusion, and large right greater than left pleural effusions with compression atelectasis  Interim History / Subjective:  Seems a little more calm since hospital admission  Objective    Blood pressure 114/77, pulse 100,  temperature 99.8 F (37.7 C), temperature source Axillary, resp. rate 13, height 5' 8 (1.727 m), weight 65.1 kg, SpO2 99%.    FiO2 (%):  [80 %] 80 % PEEP:  [5 cmH20] 5 cmH20 Pressure Support:  [5 cmH20] 5 cmH20   Intake/Output Summary (Last 24 hours) at 04/16/2024 0719 Last data  filed at 04/16/2024 0500 Gross per 24 hour  Intake 450 ml  Output 1150 ml  Net -700 ml   Filed Weights   04/15/24 0954 04/16/24 0500  Weight: 58.4 kg 65.1 kg    Examination: Physical Exam Constitutional:      General: He is not in acute distress.    Appearance: He is not ill-appearing.     Comments: Eating breakfast  HENT:     Head: Normocephalic.  Cardiovascular:     Rate and Rhythm: Normal rate and regular rhythm.  Pulmonary:     Comments: Decreased breath sounds bilaterally Abdominal:     General: Bowel sounds are normal.     Palpations: Abdomen is soft.  Musculoskeletal:        General: Normal range of motion.     Cervical back: Normal range of motion.  Skin:    General: Skin is warm.     Capillary Refill: Capillary refill takes less than 2 seconds.  Neurological:     General: No focal deficit present.     Mental Status: He is alert.     Comments: At baseline mental status, has some cognitive impairment     Resolved problem list   Assessment and Plan   Neuro  #Cognitive delay, with behavioral disturbance Continue his home risperidone  and sertraline  and Cogentin    Cardiac  #Pericardial effusion #Concern for early tamponade  #Acute on chronic systolic heart failure exacerbation   Diuresis trial, developed AKI Pleural fluid appears inflammatory rather than trasudative, this is less likely related to volume overload. Also with some evidence of right atrial and IVC collapse, patient is likely dry and can possibly be more preload dependant with his large pericardial effusion. Cardiology following for his pericardial effusion Continue colchicine  for now      Resp  #Acute hypoxic respiratory failure secondary to large bilateral right greater than left pleural effusions #Possible pneumonia vs compression atelectasis  #Neutrophil predominant right pleural effusion, could still be parapneumonic/infectious based on imaging with atelectatic right lung    Appears  improved post thoracentesis  - Cover with 5 days of antibiotics for presumed bacterial pneumonia. CTX and azithro, MRSA screen negative  Pleural and pericardial effusion could be related to inflammatory disease including rheumatoid, SLE, adult Still's, Vasculitis  - checking ANA, ANCA, DSDNA, CCP, RF, C3,C4, Ferritin  GI  Regular cardiac diet  SLP   Renal  #CKD stage 3B. #History of BPH   Looks like baseline creatinine  anywhere from 1.2-1.7 - Hold further dialysis   BPH med - Continue his   #Endo #History of type 2 diabetes Plan Holding his metformin for now D/C Sliding scale  Heme/Onc DVT ppx   MSK  No issues   Best Practice (right click and Reselect all SmartList Selections daily)   Diet/type: Regular consistency (see orders) DVT prophylaxis SCD + Heparin  SQ Pressure ulcer(s): N/A GI prophylaxis: PPI Lines: N/A Foley:  N/A Code Status:  DNR Dispo : transfer to floor    Labs   CBC: Recent Labs  Lab 04/15/24 1000 04/15/24 1013 04/16/24 0257  WBC 7.7  --  7.9  NEUTROABS 6.7  --   --  HGB 10.1* 10.5*  10.5* 9.2*  HCT 32.3* 31.0*  31.0* 28.1*  MCV 91.2  --  87.5  PLT 228  --  251    Basic Metabolic Panel: Recent Labs  Lab 04/15/24 1000 04/15/24 1013 04/16/24 0257  NA 134* 136  135 139  K 3.8 3.8  3.8 3.7  CL 99 101 105  CO2 21*  --  23  GLUCOSE 183* 184* 84  BUN 16 17 19   CREATININE 1.21 1.10 1.46*  CALCIUM  8.3*  --  8.2*   GFR: Estimated Creatinine Clearance: 40.9 mL/min (A) (by C-G formula based on SCr of 1.46 mg/dL (H)). Recent Labs  Lab 04/15/24 1000 04/15/24 1341 04/15/24 1712 04/16/24 0257  PROCALCITON  --   --  0.68 0.81  WBC 7.7  --   --  7.9  LATICACIDVEN  --  1.7  --   --     Liver Function Tests: Recent Labs  Lab 04/15/24 1000  AST 37  ALT 28  ALKPHOS 66  BILITOT 0.6  PROT 6.5  ALBUMIN 2.6*   No results for input(s): LIPASE, AMYLASE in the last 168 hours. No results for input(s): AMMONIA in the  last 168 hours.  ABG    Component Value Date/Time   HCO3 23.4 04/15/2024 1013   TCO2 25 04/15/2024 1013   TCO2 22 04/15/2024 1013   ACIDBASEDEF 1.0 04/15/2024 1013   O2SAT 92 04/15/2024 1013     Coagulation Profile: No results for input(s): INR, PROTIME in the last 168 hours.  Cardiac Enzymes: No results for input(s): CKTOTAL, CKMB, CKMBINDEX, TROPONINI in the last 168 hours.  HbA1C: Hgb A1c MFr Bld  Date/Time Value Ref Range Status  04/15/2024 03:58 PM 5.3 4.8 - 5.6 % Final    Comment:    (NOTE) Diagnosis of Diabetes The following HbA1c ranges recommended by the American Diabetes Association (ADA) may be used as an aid in the diagnosis of diabetes mellitus.  Hemoglobin             Suggested A1C NGSP%              Diagnosis  <5.7                   Non Diabetic  5.7-6.4                Pre-Diabetic  >6.4                   Diabetic  <7.0                   Glycemic control for                       adults with diabetes.    11/02/2022 06:20 AM 5.5 4.8 - 5.6 % Final    Comment:    (NOTE)         Prediabetes: 5.7 - 6.4         Diabetes: >6.4         Glycemic control for adults with diabetes: <7.0     CBG: Recent Labs  Lab 04/15/24 1605 04/15/24 1924 04/15/24 2320 04/16/24 0311  GLUCAP 160* 174* 186* 81    Review of Systems:   Not able   Past Medical History:  He,  has a past medical history of Acute UTI (urinary tract infection) (01/01/2023), AKI (acute kidney injury) (HCC) (11/02/2022), Diabetes mellitus, Hypertension, Mental retardation, and Severe sepsis (HCC) (  11/02/2022).   Surgical History:   Past Surgical History:  Procedure Laterality Date   PERICARDIOCENTESIS N/A 03/09/2024   Procedure: PERICARDIOCENTESIS;  Surgeon: [place], Peter M, MD;  Location: [facility] INVASIVE CV LAB;  Service: Cardiovascular;  Laterality: N/A;     Social History:   reports that he quit smoking about 7 years ago. His smoking use included cigarettes. He has never  used smokeless tobacco. He reports that he does not drink alcohol and does not use drugs.   Family History:  His Family history is unknown by patient.   Allergies No Known Allergies   Home Medications  Prior to Admission medications   Medication Sig Start Date End Date Taking? Authorizing Provider  alfuzosin  (UROXATRAL ) 10 MG 24 hr tablet Take 10 mg by mouth daily after breakfast.  03/30/20  Yes [provider]  ALLERGY RELIEF 10 MG tablet Take 10 mg by mouth daily.   Yes [provider]  aspirin  EC 81 MG tablet Take 81 mg by mouth daily.   Yes [provider]  atorvastatin  (LIPITOR) 20 MG tablet Take 20 mg by mouth daily.  02/28/18  Yes [provider]  benztropine  (COGENTIN ) 0.5 MG tablet Take 0.5 mg by mouth daily.    Yes [provider]  colchicine  0.6 MG tablet Take 1 tablet (0.6 mg total) by mouth daily. 03/14/24  Yes Smucker, Melvenia, MD  ferrous sulfate  325 (65 FE) MG tablet Take 325 mg by mouth every Monday, Wednesday, and Friday.  03/27/18  Yes [provider]  ketoconazole (NIZORAL) 2 % shampoo Apply 1 Application topically 2 (two) times a week.   Yes [provider]  metFORMIN (GLUCOPHAGE) 500 MG tablet Take 500 mg by mouth 2 (two) times daily.  01/20/15  Yes [provider]  potassium chloride  (KLOR-CON ) 10 MEQ tablet Take 10 mEq by mouth daily.   Yes [provider]  risperiDONE  (RISPERDAL ) 0.5 MG tablet Take 0.5 mg by mouth daily.   Yes [provider]  risperiDONE  (RISPERDAL ) 3 MG tablet Take 3 mg by mouth at bedtime. 05/16/19  Yes [provider]  sertraline  (ZOLOFT ) 50 MG tablet Take 50 mg by mouth daily.   Yes [provider]  amLODipine  (NORVASC ) 10 MG tablet Take 10 mg by mouth daily.  Patient not taking: Reported on 04/15/2024 05/22/19   [provider]  carvedilol  (COREG ) 6.25 MG tablet Take 1 tablet (6.25 mg total) by mouth 2 (two) times daily with a  meal. Patient not taking: Reported on 04/15/2024 01/03/23   Jillian Buttery, MD  ciprofloxacin  (CIPRO ) 500 MG tablet Take 500 mg by mouth 2 (two) times daily. Patient not taking: Reported on 04/15/2024 03/23/24   [provider]  lisinopril-hydrochlorothiazide (ZESTORETIC) 20-25 MG tablet Take by mouth. Patient not taking: Reported on 04/15/2024    [provider]      I spent 38 minutes caring for this patient today, including preparing to see the patient, obtaining a medical history , reviewing a separately obtained history, performing a medically appropriate examination and/or evaluation, counseling and educating the patient/family/caregiver, ordering medications, tests, or procedures, referring and communicating with other health care professionals (not separately reported), documenting clinical information in the electronic health record, independently interpreting results (not separately reported/billed) and communicating results to the patient/family/caregiver, and care coordination (not separately reported/billed)

## 2024-04-16 NOTE — Progress Notes (Addendum)
 Rounding Note   Patient Name: Fred [name] Date of Encounter: 04/16/2024  [facility] Cardiologist: None Follows with [facility]   Subjective Reports feeling better.   Scheduled Meds:  alfuzosin   10 mg Oral QPC breakfast   atorvastatin   20 mg Oral Daily   benztropine   0.5 mg Oral Daily   Chlorhexidine  Gluconate Cloth  6 each Topical Daily   colchicine   0.6 mg Oral Daily   [START ON 04/17/2024] ferrous sulfate   325 mg Oral Q M,W,F   heparin  injection (subcutaneous)  5,000 Units Subcutaneous Q8H   risperiDONE   0.5 mg Oral Daily   risperiDONE   3 mg Oral QHS   sertraline   50 mg Oral Daily   Continuous Infusions:  azithromycin      cefTRIAXone  (ROCEPHIN )  IV 2 g (04/16/24 1317)   PRN Meds: docusate sodium , polyethylene glycol   Vital Signs  Vitals:   04/16/24 0900 04/16/24 1000 04/16/24 1100 04/16/24 1132  BP: (!) 100/59 115/82 115/74   Pulse: 99 (!) 104 (!) 111   Resp: 16 (!) 22 (!) 24   Temp:    98.4 F (36.9 C)  TempSrc:    Axillary  SpO2: 97% 96% 98%   Weight:      Height:        Intake/Output Summary (Last 24 hours) at 04/16/2024 1337 Last data filed at 04/16/2024 0900 Gross per 24 hour  Intake 1000 ml  Output 1600 ml  Net -600 ml      04/16/2024    5:00 AM 04/15/2024    9:54 AM 03/07/2024    4:19 PM  Last 3 Weights  Weight (lbs) 143 lb 8.3 oz 128 lb 12 oz 128 lb 12 oz  Weight (kg) 65.1 kg 58.4 kg 58.4 kg      Telemetry Sinus tachycardia HR > 100 - Personally Reviewed  Physical Exam GEN: No acute distress, though sitting upright during entirety of interview  Neck: No JVD Cardiac: RRR Respiratory: Diminished breath sounds to right base MS: No edema; No deformity. Neuro:  Nonfocal  Psych: Normal affect   Labs High Sensitivity Troponin:   Recent Labs  Lab 04/15/24 1000 04/15/24 1155  TROPONINIHS 13 13     Chemistry Recent Labs  Lab 04/15/24 1000 04/15/24 1013 04/16/24 0257  NA 134* 136  135 139  K 3.8 3.8  3.8 3.7   CL 99 101 105  CO2 21*  --  23  GLUCOSE 183* 184* 84  BUN 16 17 19   CREATININE 1.21 1.10 1.46*  CALCIUM  8.3*  --  8.2*  PROT 6.5  --   --   ALBUMIN 2.6*  --   --   AST 37  --   --   ALT 28  --   --   ALKPHOS 66  --   --   BILITOT 0.6  --   --   GFRNONAA >60  --  50*  ANIONGAP 14  --  11    Lipids No results for input(s): CHOL, TRIG, HDL, LABVLDL, LDLCALC, CHOLHDL in the last 168 hours.  Hematology Recent Labs  Lab 04/15/24 1000 04/15/24 1013 04/16/24 0257  WBC 7.7  --  7.9  RBC 3.54*  --  3.21*  HGB 10.1* 10.5*  10.5* 9.2*  HCT 32.3* 31.0*  31.0* 28.1*  MCV 91.2  --  87.5  MCH 28.5  --  28.7  MCHC 31.3  --  32.7  RDW 14.0  --  14.1  PLT 228  --  251   Thyroid No results for input(s): TSH, FREET4 in the last 168 hours.  BNP Recent Labs  Lab 04/15/24 1000  BNP 55.6    DDimer No results for input(s): DDIMER in the last 168 hours.   Radiology  DG Chest Port 1 View Result Date: 04/16/2024 EXAM: 1 VIEW XRAY OF THE CHEST 04/16/2024 05:39:00 AM COMPARISON: AP radiograph of chest dated 04/15/2024. CLINICAL HISTORY: Pleural effusion. Encounter for pleural effusion. FINDINGS: LUNGS AND PLEURA: Hazy and reticular opacities present within the lung bases bilaterally, which have worsened in the interim on the right. Blunting of the costophrenic angles. The hemidiaphragms are obscured. HEART AND MEDIASTINUM: The heart is enlarged. BONES AND SOFT TISSUES: No acute osseous abnormality. IMPRESSION: 1. Worsening hazy and reticular opacities within the lung bases bilaterally, more pronounced on the right. 2. Blunting of the costophrenic angles and obscured hemidiaphragms, consistent with pleural effusion. 3. Enlarged heart. Electronically signed by: Evalene Coho MD 04/16/2024 05:53 AM EDT RP Workstation: [id number]   DG Chest Port 1 View Result Date: 04/15/2024 CLINICAL DATA:  Status post thoracentesis EXAM: PORTABLE CHEST 1 VIEW COMPARISON:  Chest x-ray 04/15/2024  FINDINGS: There is a small right pleural effusion which has slightly decreased. Small left pleural effusion is stable. No pneumothorax visualized. Patchy bibasilar infiltrates persist. The cardiomediastinal silhouette is within normal limits. No acute fractures are seen. IMPRESSION: 1. Small right pleural effusion has slightly decreased. No pneumothorax visualized. 2. Stable small left pleural effusion. 3. Stable patchy bibasilar infiltrates. Electronically Signed   By: Greig Pique M.D.   On: 04/15/2024 16:10   ECHOCARDIOGRAM LIMITED Result Date: 04/15/2024    ECHOCARDIOGRAM LIMITED REPORT   Patient Name:   Fred [name] Date of Exam: 04/15/2024 Medical Rec #:  [id number]     Height:       68.0 in Accession #:    [id number]    Weight:       128.7 lb Date of Birth:  1950-12-28     BSA:          1.695 m Patient Age:    73 years      BP:           102/73 mmHg Patient Gender: M             HR:           110 bpm. Exam Location:  Inpatient Procedure: Limited Echo, Cardiac Doppler and Color Doppler (Both Spectral and            Color Flow Doppler were utilized during procedure). STAT ECHO Indications:    Pericardial Effusion  History:        Patient has prior history of Echocardiogram examinations, most                 recent 03/12/2024. Risk Factors:Hypertension and Diabetes.  Sonographer:    Philomena Daring Referring Phys: ZANE ADAMS IMPRESSIONS  1. Left ventricular ejection fraction, by estimation, is 60 to 65%. The left ventricle has normal function. The left ventricle has no regional wall motion abnormalities. There is moderate concentric left ventricular hypertrophy. Left ventricular diastolic parameters are consistent with Grade I diastolic dysfunction (impaired relaxation).  2. Right ventricular systolic function is normal. The right ventricular size is normal. Tricuspid regurgitation signal is inadequate for assessing PA pressure.  3. The aortic valve is tricuspid. There is mild calcification of the aortic valve.  Aortic valve regurgitation is not visualized. Aortic valve sclerosis/calcification is present, without any evidence of aortic  stenosis.  4. The inferior vena cava is dilated in size with >50% respiratory variability, suggesting right atrial pressure of 8 mmHg.  5. There is a left pleural effusion. Moderate circumferential pericardial effusion. There does appear to be >25% respirophasic variation of mitral inflow E velocity. There is indentation of the right atrial free wall but no evidence for RV diastolic indentation/collapse. IVC is dilated but collapses with inspiration. Overall, there appears to be possible early hemodynamic compromise from pericardial effusion (respirophasic variation of mitral E inflow velocity) but not frank tamponade.  6. Limited echo for pericardial effusion. FINDINGS  Left Ventricle: Left ventricular ejection fraction, by estimation, is 60 to 65%. The left ventricle has normal function. The left ventricle has no regional wall motion abnormalities. The left ventricular internal cavity size was normal in size. There is  moderate concentric left ventricular hypertrophy. Left ventricular diastolic parameters are consistent with Grade I diastolic dysfunction (impaired relaxation). Right Ventricle: The right ventricular size is normal. Right ventricular systolic function is normal. Tricuspid regurgitation signal is inadequate for assessing PA pressure. Left Atrium: Left atrial size was normal in size. Right Atrium: Right atrial size was normal in size. Pericardium: There is a left pleural effusion. Moderate circumferential pericardial effusion. There does appear to be >25% respirophasic variation of mitral inflow E velocity. There is indentation of the right atrial free wall but no evidence for RV diastolic indentation/collapse. IVC is dilated but collapses with inspiration. Overall, there appears to be possible early hemodynamic compromise from pericardial effusion (respirophasic variation of  mitral E inflow velocity) but not frank tamponade. Tricuspid Valve: The tricuspid valve is normal in structure. Tricuspid valve regurgitation is trivial. Aortic Valve: The aortic valve is tricuspid. There is mild calcification of the aortic valve. Aortic valve regurgitation is not visualized. Aortic valve sclerosis/calcification is present, without any evidence of aortic stenosis. Venous: The inferior vena cava is dilated in size with greater than 50% respiratory variability, suggesting right atrial pressure of 8 mmHg. Additional Comments: Spectral Doppler performed. Color Doppler performed.  LEFT VENTRICLE PLAX 2D LVIDd:         4.10 cm   Diastology LVIDs:         2.70 cm   LV e' medial:  7.83 cm/s LV PW:         1.40 cm   LV e' lateral: 9.90 cm/s LV IVS:        1.20 cm LVOT diam:     2.20 cm LVOT Area:     3.80 cm  IVC IVC diam: 2.10 cm LEFT ATRIUM         Index LA diam:    3.10 cm 1.83 cm/m   SHUNTS Systemic Diam: 2.20 cm Dalton McleanMD Electronically signed by Ezra Kanner Signature Date/Time: 04/15/2024/3:45:38 PM    Final    CT Angio Chest PE W and/or Wo Contrast Result Date: 04/15/2024 CLINICAL DATA:  Decreased O2 sat, arrhythmia, abdominal pain. EXAM: CT ANGIOGRAPHY CHEST CT ABDOMEN AND PELVIS WITH CONTRAST TECHNIQUE: Multidetector CT imaging of the chest was performed using the standard protocol during bolus administration of intravenous contrast. Multiplanar CT image reconstructions and MIPs were obtained to evaluate the vascular anatomy. Multidetector CT imaging of the abdomen and pelvis was performed using the standard protocol during bolus administration of intravenous contrast. RADIATION DOSE REDUCTION: This exam was performed according to the departmental dose-optimization program which includes automated exposure control, adjustment of the mA and/or kV according to patient size and/or use of iterative reconstruction technique. CONTRAST:  75mL OMNIPAQUE  IOHEXOL  350 MG/ML SOLN COMPARISON:  CT  abdomen pelvis 01/03/2024. FINDINGS: CTA CHEST FINDINGS Cardiovascular: Image quality is degraded by respiratory motion. Negative for pulmonary embolus. Atherosclerotic calcification of the aorta and coronary arteries. Enlarged pulmonic trunk and heart. Large pericardial effusion. Mediastinum/Nodes: Mediastinal lymph nodes measure up to 11 mm high right paratracheal station (3/21). Subcarinal lymph node measures 1.9 cm. Bihilar lymph nodes measure up to 12 mm on the left. Small axillary lymph nodes bilaterally. Air in the esophagus can be seen with dysmotility. Lungs/Pleura: Image quality is markedly degraded by respiratory motion and expiratory phase imaging. Large loculated right pleural effusion with collapse/consolidation in the posterior segment right upper lobe, right middle lobe and right lower lobe. Moderate to large left pleural effusion with collapse/consolidation in the left lower lobe. Airway is unremarkable. Musculoskeletal: Degenerative changes in the spine. Review of the MIP images confirms the above findings. CT ABDOMEN and PELVIS FINDINGS Hepatobiliary: Liver and gallbladder are unremarkable. No biliary ductal dilatation. Pancreas: Negative. Spleen: Negative. Adrenals/Urinary Tract: Adrenal glands are unremarkable. Low-attenuation lesions in the kidneys. No specific follow-up necessary. Ureters are decompressed. Bladder is grossly unremarkable. Stomach/Bowel: Stomach, small bowel and appendix are unremarkable. Moderate to severe stool burden. Vascular/Lymphatic: Atherosclerotic calcification of the aorta. No pathologically enlarged lymph nodes. Reproductive: Prostate is slightly prominent. Other: No free fluid.  Mesenteries and peritoneum are unremarkable. Musculoskeletal: Degenerative changes in the spine. Review of the MIP images confirms the above findings. IMPRESSION: 1. Negative for pulmonary embolus. 2. Large loculated right pleural effusion with compressive atelectasis in the right upper,  right middle and right lower lobes. Moderate to large left pleural effusion with atelectasis in the left lower lobe. Difficult to exclude pneumonia. 3. Borderline mediastinal hilar adenopathy, possibly reactive in etiology. Difficult to exclude malignancy. Consider follow-up CT chest with contrast in 2-3 months, as clinically indicated. 4. Large pericardial effusion. 5. No acute findings in the abdomen or pelvis. 6. Moderate to severe stool burden. 7. Aortic atherosclerosis (ICD10-I70.0). Coronary artery calcification. 8. Enlarged pulmonic trunk, indicative of pulmonary arterial hypertension. Electronically Signed   By: Newell Eke M.D.   On: 04/15/2024 12:46   CT ABDOMEN PELVIS W CONTRAST Result Date: 04/15/2024 CLINICAL DATA:  Decreased O2 sat, arrhythmia, abdominal pain. EXAM: CT ANGIOGRAPHY CHEST CT ABDOMEN AND PELVIS WITH CONTRAST TECHNIQUE: Multidetector CT imaging of the chest was performed using the standard protocol during bolus administration of intravenous contrast. Multiplanar CT image reconstructions and MIPs were obtained to evaluate the vascular anatomy. Multidetector CT imaging of the abdomen and pelvis was performed using the standard protocol during bolus administration of intravenous contrast. RADIATION DOSE REDUCTION: This exam was performed according to the departmental dose-optimization program which includes automated exposure control, adjustment of the mA and/or kV according to patient size and/or use of iterative reconstruction technique. CONTRAST:  75mL OMNIPAQUE  IOHEXOL  350 MG/ML SOLN COMPARISON:  CT abdomen pelvis 01/03/2024. FINDINGS: CTA CHEST FINDINGS Cardiovascular: Image quality is degraded by respiratory motion. Negative for pulmonary embolus. Atherosclerotic calcification of the aorta and coronary arteries. Enlarged pulmonic trunk and heart. Large pericardial effusion. Mediastinum/Nodes: Mediastinal lymph nodes measure up to 11 mm high right paratracheal station (3/21).  Subcarinal lymph node measures 1.9 cm. Bihilar lymph nodes measure up to 12 mm on the left. Small axillary lymph nodes bilaterally. Air in the esophagus can be seen with dysmotility. Lungs/Pleura: Image quality is markedly degraded by respiratory motion and expiratory phase imaging. Large loculated right pleural effusion with collapse/consolidation in the posterior segment right upper  lobe, right middle lobe and right lower lobe. Moderate to large left pleural effusion with collapse/consolidation in the left lower lobe. Airway is unremarkable. Musculoskeletal: Degenerative changes in the spine. Review of the MIP images confirms the above findings. CT ABDOMEN and PELVIS FINDINGS Hepatobiliary: Liver and gallbladder are unremarkable. No biliary ductal dilatation. Pancreas: Negative. Spleen: Negative. Adrenals/Urinary Tract: Adrenal glands are unremarkable. Low-attenuation lesions in the kidneys. No specific follow-up necessary. Ureters are decompressed. Bladder is grossly unremarkable. Stomach/Bowel: Stomach, small bowel and appendix are unremarkable. Moderate to severe stool burden. Vascular/Lymphatic: Atherosclerotic calcification of the aorta. No pathologically enlarged lymph nodes. Reproductive: Prostate is slightly prominent. Other: No free fluid.  Mesenteries and peritoneum are unremarkable. Musculoskeletal: Degenerative changes in the spine. Review of the MIP images confirms the above findings. IMPRESSION: 1. Negative for pulmonary embolus. 2. Large loculated right pleural effusion with compressive atelectasis in the right upper, right middle and right lower lobes. Moderate to large left pleural effusion with atelectasis in the left lower lobe. Difficult to exclude pneumonia. 3. Borderline mediastinal hilar adenopathy, possibly reactive in etiology. Difficult to exclude malignancy. Consider follow-up CT chest with contrast in 2-3 months, as clinically indicated. 4. Large pericardial effusion. 5. No acute  findings in the abdomen or pelvis. 6. Moderate to severe stool burden. 7. Aortic atherosclerosis (ICD10-I70.0). Coronary artery calcification. 8. Enlarged pulmonic trunk, indicative of pulmonary arterial hypertension. Electronically Signed   By: Newell Eke M.D.   On: 04/15/2024 12:46   DG Chest Port 1 View Result Date: 04/15/2024 CLINICAL DATA:  Dyspnea EXAM: PORTABLE CHEST - 1 VIEW COMPARISON:  March 07, 2024 FINDINGS: Low lung volumes. Diffuse bilateral perihilar interstitial opacities. Small to moderate volume right and small left pleural effusions. No pneumothorax. Moderate cardiomegaly. Aortic atherosclerosis. No acute fracture or destructive lesions. Multilevel thoracic osteophytosis. IMPRESSION: Cardiomegaly with findings of interstitial edema. Bilateral pleural effusions, small to moderate on the right and small on the left. Electronically Signed   By: Rogelia Myers M.D.   On: 04/15/2024 11:28    Cardiac Studies Echocardiogram IMPRESSIONS     1. Left ventricular ejection fraction, by estimation, is 60 to 65%. The  left ventricle has normal function. The left ventricle has no regional  wall motion abnormalities. There is moderate concentric left ventricular  hypertrophy. Left ventricular  diastolic parameters are consistent with Grade I diastolic dysfunction  (impaired relaxation).   2. Right ventricular systolic function is normal. The right ventricular  size is normal. Tricuspid regurgitation signal is inadequate for assessing  PA pressure.   3. The aortic valve is tricuspid. There is mild calcification of the  aortic valve. Aortic valve regurgitation is not visualized. Aortic valve  sclerosis/calcification is present, without any evidence of aortic  stenosis.   4. The inferior vena cava is dilated in size with >50% respiratory  variability, suggesting right atrial pressure of 8 mmHg.   5. There is a left pleural effusion. Moderate circumferential pericardial  effusion.  There does appear to be >25% respirophasic variation of mitral  inflow E velocity. There is indentation of the right atrial free wall but  no evidence for RV diastolic  indentation/collapse. IVC is dilated but collapses with inspiration.  Overall, there appears to be possible early hemodynamic compromise from  pericardial effusion (respirophasic variation of mitral E inflow velocity)  but not frank tamponade.   6. Limited echo for pericardial effusion.   Patient Profile   73 y.o. male with a past medical history of intellectual disability with a POA,  HFmrEF, hypertension, hyperlipidemia, CKD, T2DM, hx of large pericardial effusion s/p pericardiocentesis on 03/09/24 who presented to the ED on 8/18 from his group home when staff noticed patient was having difficulty breathing. EMS arrived and found patient hypoxic (SpO2 70%) and placed him on 15 L of supplemental oxygen. CT scan showed evidence of bilateral large pleural effusions and pericardial effusion, cardiology was consulted. He was started on empiric IV antibiotics and given IV lasix  80 mg and Duonebs.   Assessment & Plan  Pericardial effusion Acute hypoxic respiratory failure 2/2 bilateral pleural Effusions Recent admission with pericardial effusion s/p pericardiocentesis (650 mL drawn off) 02/2024. Etiology was unclear, negative work-up for malignancy, infection, or autoimmune, and patient was treated for pericarditis with colchicine .   Patient now with moderate sized pericardial effusion with early signs of hemodynamic compromise per echocardiogram this admission. Additionally he also presented with bilateral pleural effusions (1.5 L removed via R thoracocentesis on 8/18, cultures/cytology/cell counts pending). Etiology work-up still ongoing. Patient currently being treated for presumed bacterial PNA. Patient has remained a febrile without leukocytosis though was noted to be coughing at his group home prior to arrival.  CRP 14.5   ESR 91  [both values elevated in comparison to previous admission] Autoimmune lab-work pending.   Consulted CT surgery for possible subxiphoid cardiac window as etiology of effusion remains unclear and now with re-accumulation.  Continue colchicine  0.6 mg daily Await CT surgery decision on cardiac window Pleural effusions per PCCM  HFmrEF Echo showed LVEF 60-65% with no RWMA. Moderate concentric LVH. Grade I Diastolic Dysfunction.  BNP 55.6, negative troponins Patient appears euvolemic on exam though decreased breath sounds on right side.  Do not suspect heart failure is the etiology of pleural effusions/shortness of breath.  Continue to hold coreg  6.25 mg BID Continue to hold lisinopril   GDMT was recently not titrated due to hypotension in the setting of pericardial effusion 02/2024, would wait to titrate GDMT this admission as patient has had softer blood pressure with this acute presentation.   Hypertension BP: 132/79 Would not initiate anti-hypertensive management in this acute setting PTA amlodipine , coreg  6.25 mg BID, & lisinopril-hydrochlorothiazide  Hyperlipidemia Continue lipitor 20 mg   Per primary T2DM CKD Pleural Effusions/ hypoxia    For questions or updates, please contact [facility] Please consult [email or web address] for contact info under     Signed, Leontine LOISE Salen, PA-C  04/16/2024, 1:37 PM    Agree with note by Leontine Salen, PA-C  Patient mated with respiratory distress.  He had pericardiocentesis 1 month ago.  He presented with hypoxia and bilateral pleural effusions right greater than left.  He had thoracentesis on the right removing 1500 cc of serosanguineous fluid.  2D echo did show a moderate circumferential pericardial effusion which had increased from his immediate post pericardiocentesis echo at which point he had no pericardial effusion.  Etiology is still unclear.  T CTS was consulted for a subxiphoid window although they felt the pericardial  effusion was not large enough to require this.  Will continue colchicine .  We will sign off.  He should probably have a repeat 2D echo in 2 to 3 months as an outpatient.  I suspect, since we do not know the etiology and since this has recurred in a short time period that this will continue to accumulate and at some point a subxiphoid window will need to be addressed.  Dorn DOROTHA Lesches, M.D., FACP, [facility], [place], [facility]  [address], [address] [place], [place]  [address]  [phone] 04/16/2024 2:59 PM

## 2024-04-16 NOTE — Consult Note (Cosign Needed)
 [address]       [place] [address]             [phone]        Fred [name] [facility] Medical Record #[id number] Date of Birth: Oct 24, 1950  Referring: No ref. provider found Primary Care: Pura Lenis, MD Primary Cardiologist:None  Chief Complaint:    Chief Complaint  Patient presents with   Shortness of Breath    History of Present Illness:     Fred [name] is a 73 year old male with a past medical history of pericardial effusion, hypertension, T2DM, CKD stage IIIa, BPH with urinary obstruction, and intellectual disability (speaks in 1-2 word phrases). He was admitted to the hospital on 07/10 with shortness of breath and leg swelling and was found to have a large pericardial effusion with early tamponade physiology. He underwent pericardiocentesis with pericardial drain placement on 07/12. LVEF at that time was 45-50% at that time. He was placed on colchicine  and discharged on 07/16 to his group home. His caregivers noted a new cough on 08/17 and was found to have an increased work of breathing on 08/18. They denied fevers, chills, nausea, vomiting, shortness of breath and leg swelling. EMS was called and upon arrival SpO2 was 75% on room air, he was placed on a non-rebreather mask at 15L and was brought to the emergency room. CXR showed large right greater than left pleural effusion. CTA on 08/18 showed a large loculated right pleural effusion with compressive atelectasis in the right upper, middle and lower lobes, moderate to large left pleural effusion with atelectasis in the left lower lobe, and a large pericardial effusion. Echocardiogram on 08/18 showed LVEF 60-65%, and moderate circumferential pericardial effusion with possible early hemodynamic compromise from pericardial effusion but not frank tamponade. He underwent right thoracentesis yesterday by PCCM yielding 1500cc serosanguinous fluid.  He lives in a group home and requires assistance with ADLs.  He is currently DNR-limited. CT surgery has been consulted for possible pericardial window.    Current Activity/ Functional Status: Patient is not independent with mobility/ambulation, transfers, ADL's, IADL's.   Zubrod Score: At the time of surgery this patient's most appropriate activity status/level should be described as: []     0    Normal activity, no symptoms []     1    Restricted in physical strenuous activity but ambulatory, able to do out light work []     2    Ambulatory and capable of self care, unable to do work activities, up and about                 more than 50%  Of the time                            [x]     3    Only limited self care, in bed greater than 50% of waking hours []     4    Completely disabled, no self care, confined to bed or chair []     5    Moribund  Past Medical History:  Diagnosis Date   Acute UTI (urinary tract infection) 01/01/2023   AKI (acute kidney injury) (HCC) 11/02/2022   Diabetes mellitus    Hypertension    Mental retardation    Severe sepsis (HCC) 11/02/2022    Past Surgical History:  Procedure Laterality Date   PERICARDIOCENTESIS N/A 03/09/2024   Procedure: PERICARDIOCENTESIS;  Surgeon:  [place], Peter M, MD;  Location: [facility] INVASIVE CV LAB;  Service: Cardiovascular;  Laterality: N/A;    Social History   Tobacco Use  Smoking Status Former   Current packs/day: 0.00   Types: Cigarettes   Quit date: 06/24/2016   Years since quitting: 7.8  Smokeless Tobacco Never    Social History   Substance and Sexual Activity  Alcohol Use No     No Known Allergies  Current Facility-Administered Medications  Medication Dose Route Frequency Provider Last Rate Last Admin   alfuzosin  (UROXATRAL ) 24 hr tablet 10 mg  10 mg Oral QPC breakfast Babcock, Peter E, NP   10 mg at 04/16/24 9056   atorvastatin  (LIPITOR) tablet 20 mg  20 mg Oral Daily Babcock, Peter E, NP   20 mg at 04/16/24 0818   azithromycin  (ZITHROMAX ) 500 mg in sodium chloride  0.9 % 250  mL IVPB  500 mg Intravenous Q24H Hattar, Zola SAILOR, MD       benztropine  (COGENTIN ) tablet 0.5 mg  0.5 mg Oral Daily Babcock, Peter E, NP   0.5 mg at 04/16/24 0944   cefTRIAXone  (ROCEPHIN ) 2 g in sodium chloride  0.9 % 100 mL IVPB  2 g Intravenous Q24H Hattar, Zola SAILOR, MD       Chlorhexidine  Gluconate Cloth 2 % PADS 6 each  6 each Topical Daily Babcock, Peter E, NP   6 each at 04/16/24 9051   colchicine  tablet 0.6 mg  0.6 mg Oral Daily Babcock, Peter E, NP   0.6 mg at 04/16/24 9056   docusate sodium  (COLACE) capsule 100 mg  100 mg Oral BID PRN Babcock, Peter E, NP       [START ON 04/17/2024] ferrous sulfate  tablet 325 mg  325 mg Oral Q M,W,F Babcock, Peter E, NP       heparin  injection 5,000 Units  5,000 Units Subcutaneous Q8H Hattar, Zola SAILOR, MD       polyethylene glycol (MIRALAX  / GLYCOLAX ) packet 17 g  17 g Oral Daily PRN Babcock, Peter E, NP       risperiDONE  (RISPERDAL ) tablet 0.5 mg  0.5 mg Oral Daily Babcock, Peter E, NP   0.5 mg at 04/16/24 0944   risperiDONE  (RISPERDAL ) tablet 3 mg  3 mg Oral QHS Babcock, Peter E, NP   3 mg at 04/15/24 2214   sertraline  (ZOLOFT ) tablet 50 mg  50 mg Oral Daily Babcock, Peter E, NP   50 mg at 04/16/24 0818    Medications Prior to Admission  Medication Sig Dispense Refill Last Dose/Taking   alfuzosin  (UROXATRAL ) 10 MG 24 hr tablet Take 10 mg by mouth daily after breakfast.    04/15/2024   ALLERGY RELIEF 10 MG tablet Take 10 mg by mouth daily.   04/15/2024   aspirin  EC 81 MG tablet Take 81 mg by mouth daily.   04/15/2024   atorvastatin  (LIPITOR) 20 MG tablet Take 20 mg by mouth daily.    04/15/2024   benztropine  (COGENTIN ) 0.5 MG tablet Take 0.5 mg by mouth daily.    04/15/2024   colchicine  0.6 MG tablet Take 1 tablet (0.6 mg total) by mouth daily. 90 tablet 0 04/15/2024   ferrous sulfate  325 (65 FE) MG tablet Take 325 mg by mouth every Monday, Wednesday, and Friday.    04/15/2024   ketoconazole (NIZORAL) 2 % shampoo Apply 1 Application topically 2 (two) times a  week.   Past Week   metFORMIN (GLUCOPHAGE) 500 MG tablet Take 500 mg by mouth 2 (two)  times daily.    04/15/2024   [Paused] potassium chloride  (KLOR-CON ) 10 MEQ tablet Take 10 mEq by mouth daily.   04/15/2024   risperiDONE  (RISPERDAL ) 0.5 MG tablet Take 0.5 mg by mouth daily.   04/15/2024   risperiDONE  (RISPERDAL ) 3 MG tablet Take 3 mg by mouth at bedtime.   04/14/2024   sertraline  (ZOLOFT ) 50 MG tablet Take 50 mg by mouth daily.   04/15/2024   [Paused] amLODipine  (NORVASC ) 10 MG tablet Take 10 mg by mouth daily.  (Patient not taking: Reported on 04/15/2024)   Not Taking   [Paused] carvedilol  (COREG ) 6.25 MG tablet Take 1 tablet (6.25 mg total) by mouth 2 (two) times daily with a meal. (Patient not taking: Reported on 04/15/2024) 60 tablet 0 Not Taking   ciprofloxacin  (CIPRO ) 500 MG tablet Take 500 mg by mouth 2 (two) times daily. (Patient not taking: Reported on 04/15/2024)   Not Taking   [Paused] lisinopril-hydrochlorothiazide (ZESTORETIC) 20-25 MG tablet Take by mouth. (Patient not taking: Reported on 04/15/2024)   Not Taking    Family History  Family history unknown: Yes     Review of Systems:  Patient cannot answer questions, ROS was performed through sister based on what she has seen and been told from primary caregiver Review of Systems  Constitutional:  Negative for chills, fever, malaise/fatigue and weight loss.  HENT:  Negative for hearing loss.   Eyes:  Negative for blurred vision.  Respiratory:  Positive for cough and shortness of breath. Negative for sputum production and wheezing.   Cardiovascular:  Negative for chest pain, palpitations and leg swelling.  Gastrointestinal:  Negative for abdominal pain, constipation, diarrhea and vomiting.  Musculoskeletal:  Negative for falls.  Skin:  Negative for rash.  Neurological:  Negative for loss of consciousness.  Endo/Heme/Allergies:  Does not bruise/bleed easily.  Psychiatric/Behavioral:  Negative for depression. The patient is not  nervous/anxious.     Physical Exam: BP (!) 100/59   Pulse 99   Temp 98.4 F (36.9 C) (Oral)   Resp 16   Ht 5' 8 (1.727 m)   Wt 65.1 kg   SpO2 97%   BMI 21.82 kg/m   General appearance: alert and slowed mentation Head: Normocephalic, without obvious abnormality, atraumatic Neck: no adenopathy, no carotid bruit, no JVD, supple, symmetrical, trachea midline, and thyroid not enlarged, symmetric, no tenderness/mass/nodules Lymph nodes: Cervical, supraclavicular, and axillary nodes normal. Resp: diminished bibasilar breath sounds Cardio: sinus tachycardia, no murmur GI: soft, non-tender; bowel sounds normal; no masses,  no organomegaly Extremities: extremities normal, atraumatic, no cyanosis or edema Neurologic: Patient is alert but unable to answer questions only making noises while in the room, weakly followed commands at times  Diagnostic Studies & Radiology Findings:  ECHOCARDIOGRAM LIMITED REPORT    Patient Name:   Fred [name] Date of Exam: 04/15/2024  Medical Rec #:  [id number]     Height:       68.0 in  Accession #:    [id number]    Weight:       128.7 lb  Date of Birth:  1950-09-28     BSA:          1.695 m  Patient Age:    73 years      BP:           102/73 mmHg  Patient Gender: M             HR:           110 bpm.  Exam Location:  Inpatient   Procedure: Limited Echo, Cardiac Doppler and Color Doppler (Both Spectral  and            Color Flow Doppler were utilized during procedure).   STAT ECHO   Indications:    Pericardial Effusion    History:        Patient has prior history of Echocardiogram examinations,  most                 recent 03/12/2024. Risk Factors:Hypertension and Diabetes.    Sonographer:    Philomena Daring  Referring Phys: ZANE ADAMS   IMPRESSIONS    1. Left ventricular ejection fraction, by estimation, is 60 to 65%. The  left ventricle has normal function. The left ventricle has no regional  wall motion abnormalities. There is moderate  concentric left ventricular  hypertrophy. Left ventricular  diastolic parameters are consistent with Grade I diastolic dysfunction  (impaired relaxation).   2. Right ventricular systolic function is normal. The right ventricular  size is normal. Tricuspid regurgitation signal is inadequate for assessing  PA pressure.   3. The aortic valve is tricuspid. There is mild calcification of the  aortic valve. Aortic valve regurgitation is not visualized. Aortic valve  sclerosis/calcification is present, without any evidence of aortic  stenosis.   4. The inferior vena cava is dilated in size with >50% respiratory  variability, suggesting right atrial pressure of 8 mmHg.   5. There is a left pleural effusion. Moderate circumferential pericardial  effusion. There does appear to be >25% respirophasic variation of mitral  inflow E velocity. There is indentation of the right atrial free wall but  no evidence for RV diastolic  indentation/collapse. IVC is dilated but collapses with inspiration.  Overall, there appears to be possible early hemodynamic compromise from  pericardial effusion (respirophasic variation of mitral E inflow velocity)  but not frank tamponade.   6. Limited echo for pericardial effusion.   FINDINGS   Left Ventricle: Left ventricular ejection fraction, by estimation, is 60  to 65%. The left ventricle has normal function. The left ventricle has no  regional wall motion abnormalities. The left ventricular internal cavity  size was normal in size. There is   moderate concentric left ventricular hypertrophy. Left ventricular  diastolic parameters are consistent with Grade I diastolic dysfunction  (impaired relaxation).   Right Ventricle: The right ventricular size is normal. Right ventricular  systolic function is normal. Tricuspid regurgitation signal is inadequate  for assessing PA pressure.   Left Atrium: Left atrial size was normal in size.   Right Atrium: Right atrial  size was normal in size.   Pericardium: There is a left pleural effusion. Moderate circumferential  pericardial effusion. There does appear to be >25% respirophasic variation  of mitral inflow E velocity. There is indentation of the right atrial free  wall but no evidence for RV  diastolic indentation/collapse. IVC is dilated but collapses with  inspiration. Overall, there appears to be possible early hemodynamic  compromise from pericardial effusion (respirophasic variation of mitral E  inflow velocity) but not frank tamponade.   Tricuspid Valve: The tricuspid valve is normal in structure. Tricuspid  valve regurgitation is trivial.   Aortic Valve: The aortic valve is tricuspid. There is mild calcification  of the aortic valve. Aortic valve regurgitation is not visualized. Aortic  valve sclerosis/calcification is present, without any evidence of aortic  stenosis.   Venous: The inferior vena cava is dilated in size with greater than  50%  respiratory variability, suggesting right atrial pressure of 8 mmHg.   Additional Comments: Spectral Doppler performed. Color Doppler performed.    LEFT VENTRICLE  PLAX 2D  LVIDd:         4.10 cm   Diastology  LVIDs:         2.70 cm   LV e' medial:  7.83 cm/s  LV PW:         1.40 cm   LV e' lateral: 9.90 cm/s  LV IVS:        1.20 cm  LVOT diam:     2.20 cm  LVOT Area:     3.80 cm     IVC  IVC diam: 2.10 cm   LEFT ATRIUM         Index  LA diam:    3.10 cm 1.83 cm/m     SHUNTS  Systemic Diam: 2.20 cm   Dalton McleanMD  Electronically signed by Ezra Kanner  Signature Date/Time: 04/15/2024/3:45:38 PM      Final     Assessment & Plan: Recurrent pericardial effusion: Moderate circumferential pericardial effusion with possible early hemodynamic compromise. Underwent pericardiocentesis with pericardial drain about 1 month ago now with recurrence. Would likely benefit from surgery but unsure if he would be a candidate due to comorbidities  and mental status. Sister is power of attorney and reports she would want him to undergo surgery if that is his best treatment option, at this time she is willing to change DNR/DNI if needed for surgery. Dr. Shyrl to determine surgical candidacy.  Bilateral pleural effusions: Right thoracentesis performed by PCCM yesterday yielded 1500cc of serosanguinous fluid. Pending cultures, cytology, cell counts and chemistries. On room air today.  Mental retardation: Mostly nonverbal and unable to communicate symptoms. Lives in a group home, able to perform most activities of daily living with assistance. Sister is power of attorney.  CKD stage IIIB T2DM  Con GORMAN Bend, PA-C 04/16/24   73yo male with multiple medical and social problems who presents with a moderate pericardial effusion, and large pleural effusions.  Would recommend treated the pleural effusions 1st, and then re-assessing his symptoms.  No plans for pericardial window at this point  Linnie MALVA Shyrl

## 2024-04-16 NOTE — Procedures (Signed)
 Thoracentesis  Procedure Note  Fred [name]  [id number]  03-17-1951  Date:04/15/2024 Time: 14:30  Provider Performing:Jaisa Defino N Ronnald Shedden   Procedure: Thoracentesis with imaging guidance (67444)  Indication(s) Pleural Effusion  Consent Risks of the procedure as well as the alternatives and risks of each were explained to the patient and/or caregiver.  Consent for the procedure was obtained and is signed in the bedside chart  Anesthesia Topical only with 1% lidocaine     Time Out Verified patient identification, verified procedure, site/side was marked, verified correct patient position, special equipment/implants available, medications/allergies/relevant history reviewed, required imaging and test results available.   Sterile Technique Maximal sterile technique including full sterile barrier drape, hand hygiene, sterile gown, sterile gloves, mask, hair covering, sterile ultrasound probe cover (if used).  Procedure Description Ultrasound was used to identify appropriate pleural anatomy for placement and overlying skin marked.  Area of drainage cleaned and draped in sterile fashion. Lidocaine  was used to anesthetize the skin and subcutaneous tissue.  1500 cc's of serosang appearing fluid was drained from the right pleural space. Catheter then removed and bandaid applied to site.   Complications/Tolerance None; patient tolerated the procedure well. Chest X-ray is ordered to confirm no post-procedural complication.   EBL Minimal   Specimen(s) Pleural fluid

## 2024-04-17 ENCOUNTER — Inpatient Hospital Stay (HOSPITAL_COMMUNITY)

## 2024-04-17 DIAGNOSIS — J9601 Acute respiratory failure with hypoxia: Secondary | ICD-10-CM

## 2024-04-17 LAB — GLUCOSE, CAPILLARY
Glucose-Capillary: 109 mg/dL — ABNORMAL HIGH (ref 70–99)
Glucose-Capillary: 147 mg/dL — ABNORMAL HIGH (ref 70–99)
Glucose-Capillary: 172 mg/dL — ABNORMAL HIGH (ref 70–99)
Glucose-Capillary: 151 mg/dL — ABNORMAL HIGH (ref 70–99)

## 2024-04-17 LAB — BASIC METABOLIC PANEL WITH GFR
Anion gap: 9 (ref 5–15)
BUN: 15 mg/dL (ref 8–23)
CO2: 25 mmol/L (ref 22–32)
Calcium: 8.6 mg/dL — ABNORMAL LOW (ref 8.9–10.3)
Chloride: 107 mmol/L (ref 98–111)
Creatinine, Ser: 1.17 mg/dL (ref 0.61–1.24)
GFR, Estimated: >60 mL/min (ref >60)
Glucose, Bld: 165 mg/dL — ABNORMAL HIGH (ref 70–99)
Potassium: 4 mmol/L (ref 3.5–5.1)
Sodium: 141 mmol/L (ref 135–145)

## 2024-04-17 LAB — ANCA PROFILE
Anti-MPO Antibodies: <0.2 U (ref 0.0–0.9)
Anti-PR3 Antibodies: <0.2 U (ref 0.0–0.9)
Atypical P-ANCA titer: <1:20 {titer}
C-ANCA: <1:20 {titer}
P-ANCA: <1:20 {titer}

## 2024-04-17 LAB — ANTINUCLEAR ANTIBODIES, IFA: ANA Ab, IFA: NEGATIVE

## 2024-04-17 LAB — PROCALCITONIN: Procalcitonin: 0.51 ng/mL

## 2024-04-17 LAB — ANTI-DNA ANTIBODY, DOUBLE-STRANDED: ds DNA Ab: <1 [IU]/mL (ref 0–9)

## 2024-04-17 LAB — CYTOLOGY - NON PAP

## 2024-04-17 MED ORDER — METOPROLOL TARTRATE 5 MG/5ML IV SOLN
5.0000 mg | Freq: Once | INTRAVENOUS | Status: AC
  Administered 2024-04-17: 5 mg via INTRAVENOUS
  Filled 2024-04-17: qty 5

## 2024-04-17 MED ORDER — INSULIN ASPART 100 UNIT/ML IJ SOLN
0.0000 [IU] | Freq: Three times a day (TID) | INTRAMUSCULAR | Status: DC
  Administered 2024-04-17 – 2024-04-20 (×4): 1 [IU] via SUBCUTANEOUS

## 2024-04-17 NOTE — Plan of Care (Signed)
   Problem: Coping: Goal: Ability to adjust to condition or change in health will improve Outcome: Progressing   Problem: Nutritional: Goal: Maintenance of adequate nutrition will improve Outcome: Progressing   Problem: Skin Integrity: Goal: Risk for impaired skin integrity will decrease Outcome: Progressing

## 2024-04-17 NOTE — TOC Progression Note (Signed)
 Transition of Care [facility]) - Progression Note    Patient Details  Name: Fred [name] MRN: [id number] Date of Birth: Sep 07, 1950  [facility] CM/SW Contact  Luise JAYSON Pan, [place] Phone Number: 04/17/2024, 10:59 AM  Clinical Narrative:   CSW spoke with patiens sister/POA, [name] and group home caregiver, Mr. Joshua. At this time, [name] and Mr. Joshua are okay with patient discharging home once medically stable. [name] would like for medical team to call her with updates. CSW notified treatment team.   CSW will continue to follow.   Expected Discharge Plan: Group Home Barriers to Discharge: Continued Medical Work up               Expected Discharge Plan and Services       Living arrangements for the past 2 months: Group Home                                       Social Drivers of Health (SDOH) Interventions SDOH Screenings   Food Insecurity: No Food Insecurity (04/16/2024)  Housing: Low Risk  (04/16/2024)  Transportation Needs: No Transportation Needs (04/16/2024)  Utilities: Not At Risk (04/16/2024)  Financial Resource Strain: Low Risk  (03/20/2024)   Received from [facility]  Physical Activity: Unknown (04/26/2023)   Received from [facility]  Social Connections: Unknown (04/16/2024)  Stress: No Stress Concern Present (04/26/2023)   Received from [facility]  Tobacco Use: Medium Risk (03/20/2024)   Received from [facility]    Readmission Risk Interventions     No data to display

## 2024-04-17 NOTE — Progress Notes (Signed)
 Progress Note   Patient: Fred [name] FMW:[id number] DOB: 10-16-1950 DOA: 04/15/2024     2 DOS: the patient was seen and examined on 04/17/2024   Brief hospital course: From HPI this is a 73 year old male patient who resides at a group home.  At baseline he is ambulatory without oxygen, requires assistance with ADLs, not able to manage household.  Speaks in 1-2 word phrases.  Recently discharged on 7/16 after hospital admission where he was admitted for initially shortness of breath and leg swelling, found to have a large pericardial effusion with early tamponade physiology.  Ended up He underwent pericardial drain placement on 7/12.  Diagnostics were inconclusive as to etiology of the pericardial effusion.  His sed rate was 32, so felt low for pericarditis but cardiology could not rule out.  Was placed on colchicine  with plan to complete 62-month duration.  During that hospital admission his EF was estimated 45 to 50%   He had been at the group home since discharge.  Has been in his usual state of health per his caregivers, however staff did notice a new onset of a cough on 8/17.  There is no note change of behavior, no fevers, cough later in the afternoon seemed to subside but then in the a.m. hours on 8/18 he was found by staff with increased work of breathing.  He was moaning, and looked acutely ill per staff and therefore EMS was called.  On arrival pulse oximetry on room air was 75% he was placed on a nonrebreather mask at 15 L and transferred to the emergency room for further evaluation   Diagnostic evaluation in the emergency room identified the following: His respiratory viral panel was negative white blood cell count normal at 7.7 hemoglobin 10.1 BNP was normal at 55.6 troponin I was negative serum bicarbonate 21 serum creatinine 1.21 from baseline of 1.28-1.4, venous arterial blood gas obtained showed pH of 7.38 pCO2 of 39 HCO3 of 23 lactic acid was negative.  A portable chest x-ray was  obtained and this showed a large right greater than left pleural effusion.  Bilateral pulmonary edema a follow-up CT angiogram was obtained this was negative for pulmonary embolus but did indeed show large right greater than left pleural effusion, also once again demonstrated a moderate-sized pericardial effusion.  He was placed on noninvasive positive pressure ventilation for his work of breathing.  Cultures were sent, he was administered empiric antibiotics as well as IV Lasix .  Cardiology was consulted given the findings of the CT scan in regards to the pericardial effusion and a stat echocardiogram was completed while in the ER.  Because of his increased work of breathing, and large pleural effusions as well as pericardial effusion, pulmonary was asked to evaluate for #1 appropriateness for hospital admission to the ICU and #2 possibility of therapeutic and diagnostic thoracentesis.  On arrival he was initially fairly agitated on BiPAP while staff was obtaining echocardiogram this is since settled down.     Patient transferred from the ICU to TRH service on 04/17/2024  Assessment and Plan:  #Acute hypoxic respiratory failure secondary to large bilateral right greater than left pleural effusions #Possible pneumonia vs compression atelectasis  #Neutrophil predominant right pleural effusion, could still be parapneumonic/infectious based on imaging with atelectatic right lung  Appears improved post thoracentesis  We will complete 5 days of antibiotics for presumed bacterial pneumonia Continue ceftriaxone   Has completed azithromycin  course, MRSA screen negative Pleural and pericardial effusion could be related to inflammatory disease including  rheumatoid, SLE, adult Still's, Vasculitis  Follow-up ANA, ANCA, DSDNA, CCP, RF, C3,C4    #Cognitive delay, with behavioral disturbance Continue his home risperidone  and sertraline  and Cogentin   #Pericardial effusion #Concern for early tamponade  #Acute on  chronic systolic heart failure exacerbation  Diuresis trial, developed AKI Cardiology following for his pericardial effusion Continue colchicine  for now   CT surgery on board and talking with patient's family concerning possible surgery   #CKD stage 3B. #History of BPH   Looks like baseline creatinine  anywhere from 1.2-1.7   BPH med - Continue his     #History of type 2 diabetes Holding his metformin for now Continue insulin  therapy   DVT prophylaxis SCD + Heparin  SQ   Subjective:  Patient seen and examined at bedside this morning Denies any acute overnight events  Physical Exam:    General: He is not in acute distress. HENT:     Head: Normocephalic.  Cardiovascular:     Rate and Rhythm: Normal rate and regular rhythm.  Pulmonary: Breath sounds decreased bilaterally especially at the bases Abdominal:     General: Bowel sounds are normal.     Palpations: Abdomen is soft.  Musculoskeletal:        General: Normal range of motion.     Cervical back: Normal range of motion.  Skin:    General: Skin is warm.     Capillary Refill: Capillary refill takes less than 2 seconds.  Neurological:     General: No focal deficit present.    Vitals:   04/16/24 2331 04/17/24 0349 04/17/24 0720 04/17/24 1115  BP: 136/86 139/80 (!) 130/97 116/78  Pulse: 95 91 100 94  Resp: 18 20 18 18   Temp: 97.7 F (36.5 C) 98.1 F (36.7 C) 97.7 F (36.5 C) 97.7 F (36.5 C)  TempSrc: Oral Oral Oral Oral  SpO2: 94% 99% 94% 96%  Weight:  62.5 kg    Height:        Data Reviewed:     Latest Ref Rng & Units 04/16/2024    2:57 AM 04/15/2024   10:13 AM 04/15/2024   10:00 AM  CBC  WBC 4.0 - 10.5 K/uL 7.9   7.7   Hemoglobin 13.0 - 17.0 g/dL 9.2  89.4    89.4  89.8   Hematocrit 39.0 - 52.0 % 28.1  31.0    31.0  32.3   Platelets 150 - 400 K/uL 251   228        Latest Ref Rng & Units 04/17/2024   10:54 AM 04/16/2024    2:57 AM 04/15/2024   10:13 AM  BMP  Glucose 70 - 99 mg/dL 834  84   815   BUN 8 - 23 mg/dL 15  19  17    Creatinine 0.61 - 1.24 mg/dL 8.82  8.53  8.89   Sodium 135 - 145 mmol/L 141  139  135    136   Potassium 3.5 - 5.1 mmol/L 4.0  3.7  3.8    3.8   Chloride 98 - 111 mmol/L 107  105  101   CO2 22 - 32 mmol/L 25  23    Calcium  8.9 - 10.3 mg/dL 8.6  8.2       Family Communication: None present at bedside  Disposition: Still having workup ongoing from CT vacs, cardiology  Status is: Inpatient   Time spent: 41 minutes  Author: Drue ONEIDA Potter, MD 04/17/2024 1:59 PM  For on call review [email or web address].

## 2024-04-17 NOTE — Progress Notes (Signed)
 NAME:  Fred [name], MRN:  [id number], DOB:  1951-07-15, LOS: 2 ADMISSION DATE:  04/15/2024, CONSULTATION DATE:  8/18 REFERRING MD:  Elnor, CHIEF COMPLAINT:  acute respiratory failure    History of Present Illness:  This is a 73 year old male patient who resides at a group home.  At baseline he is ambulatory without oxygen, requires assistance with ADLs, not able to manage household.  Speaks in 1-2 word phrases.  Recently discharged on 7/16 after hospital admission where he was admitted for initially shortness of breath and leg swelling, found to have a large pericardial effusion with early tamponade physiology.  Ended up He underwent pericardial drain placement on 7/12.  Diagnostics were inconclusive as to etiology of the pericardial effusion.  His sed rate was 32, so felt low for pericarditis but cardiology could not rule out.  Was placed on colchicine  with plan to complete 61-month duration.  During that hospital admission his EF was estimated 45 to 50%  He had been at the group home since discharge.  Has been in his usual state of health per his caregivers, however staff did notice a new onset of a cough on 8/17.  There is no note change of behavior, no fevers, cough later in the afternoon seemed to subside but then in the a.m. hours on 8/18 he was found by staff with increased work of breathing.  He was moaning, and looked acutely ill per staff and therefore EMS was called.  On arrival pulse oximetry on room air was 75% he was placed on a nonrebreather mask at 15 L and transferred to the emergency room for further evaluation  Diagnostic evaluation in the emergency room identified the following: His respiratory viral panel was negative white blood cell count normal at 7.7 hemoglobin 10.1 BNP was normal at 55.6 troponin I was negative serum bicarbonate 21 serum creatinine 1.21 from baseline of 1.28-1.4, venous arterial blood gas obtained showed pH of 7.38 pCO2 of 39 HCO3 of 23 lactic acid was negative.   A portable chest x-ray was obtained and this showed a large right greater than left pleural effusion.  Bilateral pulmonary edema a follow-up CT angiogram was obtained this was negative for pulmonary embolus but did indeed show large right greater than left pleural effusion, also once again demonstrated a moderate-sized pericardial effusion.  He was placed on noninvasive positive pressure ventilation for his work of breathing.  Cultures were sent, he was administered empiric antibiotics as well as IV Lasix .  Cardiology was consulted given the findings of the CT scan in regards to the pericardial effusion and a stat echocardiogram was completed while in the ER.  Because of his increased work of breathing, and large pleural effusions as well as pericardial effusion, pulmonary was asked to evaluate for #1 appropriateness for hospital admission to the ICU and #2 possibility of therapeutic and diagnostic thoracentesis.  On arrival he was initially fairly agitated on BiPAP while staff was obtaining echocardiogram this is since settled down.   Pertinent  Medical History  Cognitive delay, type 2 diabetes, hypertension, BPH, recent hospital admission for large pericardial effusion with tamponade features requiring drainage  Significant Hospital Events: Including procedures, antibiotic start and stop dates in addition to other pertinent events   8/18 admitted with acute hypoxic respiratory failure, pericardial effusion, and large right greater than left pleural effusions with compression atelectasis  Interim History / Subjective:  Seems a little more calm since hospital admission  Objective    Blood pressure 116/78, pulse 94,  temperature 97.7 F (36.5 C), temperature source Oral, resp. rate 18, height 5' 8 (1.727 m), weight 62.5 kg, SpO2 96%.        Intake/Output Summary (Last 24 hours) at 04/17/2024 1249 Last data filed at 04/17/2024 1216 Gross per 24 hour  Intake 840 ml  Output 2950 ml  Net -2110 ml    Filed Weights   04/16/24 0500 04/16/24 1424 04/17/24 0349  Weight: 65.1 kg 62.7 kg 62.5 kg    Examination: Physical Exam Constitutional:      General: He is not in acute distress.    Appearance: He is not ill-appearing.     Comments: Eating breakfast  HENT:     Head: Normocephalic.  Cardiovascular:     Rate and Rhythm: Normal rate and regular rhythm.  Pulmonary:     Comments: Decreased breath sounds bilaterally. Poor air entry at both lung bases  Abdominal:     General: Bowel sounds are normal.     Palpations: Abdomen is soft.  Musculoskeletal:        General: Normal range of motion.     Cervical back: Normal range of motion.  Skin:    General: Skin is warm.     Capillary Refill: Capillary refill takes less than 2 seconds.  Neurological:     General: No focal deficit present.     Mental Status: He is alert.     Comments: At baseline mental status, has some cognitive impairment   Bedside US  of lungs   Right lung US     Left lung US      Resolved problem list   Assessment and Plan    Resp  #Acute hypoxic respiratory failure secondary to large bilateral right greater than left pleural effusions (improved) #Possible pneumonia vs compression atelectasis  #Neutrophil predominant right pleural effusion, could still be parapneumonic/infectious based on imaging with atelectatic right lung    Appears improved post thoracentesis  - Cover with 5 days of antibiotics for presumed bacterial pneumonia. CTX and azithro, MRSA screen negative   Pleural and pericardial effusion could be related to inflammatory disease including rheumatoid, SLE, adult Still's, Vasculitis  - checking ANA (negative), ANCA (pending), DSDNA(negative), CCP (pending), RF(pending), C3,C4 (pending), Ferritin (normal).   I suspect if cytology negative, auto immune work up is negative that his effusion could be related to inflmamation from pericarditis or parapneumonic with an element of volume overload.   Effusion is exudative with neutrophil predominance. He is currently comfortable on room air without any evidence of respiratory distress.   Bedside US  today showing bilateral pleural effusions larger on the right. Sediment present on both effusions suggestive of similar inflammatory process.    Best Practice (right click and Reselect all SmartList Selections daily)   Per Primary    Labs   CBC: Recent Labs  Lab 04/15/24 1000 04/15/24 1013 04/16/24 0257  WBC 7.7  --  7.9  NEUTROABS 6.7  --   --   HGB 10.1* 10.5*  10.5* 9.2*  HCT 32.3* 31.0*  31.0* 28.1*  MCV 91.2  --  87.5  PLT 228  --  251    Basic Metabolic Panel: Recent Labs  Lab 04/15/24 1000 04/15/24 1013 04/16/24 0257  NA 134* 136  135 139  K 3.8 3.8  3.8 3.7  CL 99 101 105  CO2 21*  --  23  GLUCOSE 183* 184* 84  BUN 16 17 19   CREATININE 1.21 1.10 1.46*  CALCIUM  8.3*  --  8.2*   GFR:  Estimated Creatinine Clearance: 39.2 mL/min (A) (by C-G formula based on SCr of 1.46 mg/dL (H)). Recent Labs  Lab 04/15/24 1000 04/15/24 1341 04/15/24 1712 04/16/24 0257 04/17/24 0244  PROCALCITON  --   --  0.68 0.81 0.51  WBC 7.7  --   --  7.9  --   LATICACIDVEN  --  1.7  --   --   --     Liver Function Tests: Recent Labs  Lab 04/15/24 1000  AST 37  ALT 28  ALKPHOS 66  BILITOT 0.6  PROT 6.5  ALBUMIN 2.6*   No results for input(s): LIPASE, AMYLASE in the last 168 hours. No results for input(s): AMMONIA in the last 168 hours.  ABG    Component Value Date/Time   HCO3 23.4 04/15/2024 1013   TCO2 25 04/15/2024 1013   TCO2 22 04/15/2024 1013   ACIDBASEDEF 1.0 04/15/2024 1013   O2SAT 92 04/15/2024 1013     Coagulation Profile: No results for input(s): INR, PROTIME in the last 168 hours.  Cardiac Enzymes: No results for input(s): CKTOTAL, CKMB, CKMBINDEX, TROPONINI in the last 168 hours.  HbA1C: Hgb A1c MFr Bld  Date/Time Value Ref Range Status  04/15/2024 03:58 PM 5.3 4.8 - 5.6 %  Final    Comment:    (NOTE) Diagnosis of Diabetes The following HbA1c ranges recommended by the American Diabetes Association (ADA) may be used as an aid in the diagnosis of diabetes mellitus.  Hemoglobin             Suggested A1C NGSP%              Diagnosis  <5.7                   Non Diabetic  5.7-6.4                Pre-Diabetic  >6.4                   Diabetic  <7.0                   Glycemic control for                       adults with diabetes.    11/02/2022 06:20 AM 5.5 4.8 - 5.6 % Final    Comment:    (NOTE)         Prediabetes: 5.7 - 6.4         Diabetes: >6.4         Glycemic control for adults with diabetes: <7.0     CBG: Recent Labs  Lab 04/16/24 1105 04/16/24 1546 04/16/24 2108 04/17/24 0606 04/17/24 1040  GLUCAP 146* 117* 193* 109* 147*    Review of Systems:   Not able   Past Medical History:  He,  has a past medical history of Acute UTI (urinary tract infection) (01/01/2023), AKI (acute kidney injury) (HCC) (11/02/2022), Diabetes mellitus, Hypertension, Mental retardation, and Severe sepsis (HCC) (11/02/2022).   Surgical History:   Past Surgical History:  Procedure Laterality Date   PERICARDIOCENTESIS N/A 03/09/2024   Procedure: PERICARDIOCENTESIS;  Surgeon: [place], Peter M, MD;  Location: [facility] INVASIVE CV LAB;  Service: Cardiovascular;  Laterality: N/A;     Social History:   reports that he quit smoking about 7 years ago. His smoking use included cigarettes. He has never used smokeless tobacco. He reports that he does not drink alcohol and does not use drugs.  Family History:  His Family history is unknown by patient.   Allergies No Known Allergies   Home Medications  Prior to Admission medications   Medication Sig Start Date End Date Taking? Authorizing Provider  alfuzosin  (UROXATRAL ) 10 MG 24 hr tablet Take 10 mg by mouth daily after breakfast.  03/30/20  Yes [provider]  ALLERGY RELIEF 10 MG tablet Take 10 mg by mouth  daily.   Yes [provider]  aspirin  EC 81 MG tablet Take 81 mg by mouth daily.   Yes [provider]  atorvastatin  (LIPITOR) 20 MG tablet Take 20 mg by mouth daily.  02/28/18  Yes [provider]  benztropine  (COGENTIN ) 0.5 MG tablet Take 0.5 mg by mouth daily.    Yes [provider]  colchicine  0.6 MG tablet Take 1 tablet (0.6 mg total) by mouth daily. 03/14/24  Yes Smucker, Melvenia, MD  ferrous sulfate  325 (65 FE) MG tablet Take 325 mg by mouth every Monday, Wednesday, and Friday.  03/27/18  Yes [provider]  ketoconazole (NIZORAL) 2 % shampoo Apply 1 Application topically 2 (two) times a week.   Yes [provider]  metFORMIN (GLUCOPHAGE) 500 MG tablet Take 500 mg by mouth 2 (two) times daily.  01/20/15  Yes [provider]  potassium chloride  (KLOR-CON ) 10 MEQ tablet Take 10 mEq by mouth daily.   Yes [provider]  risperiDONE  (RISPERDAL ) 0.5 MG tablet Take 0.5 mg by mouth daily.   Yes [provider]  risperiDONE  (RISPERDAL ) 3 MG tablet Take 3 mg by mouth at bedtime. 05/16/19  Yes [provider]  sertraline  (ZOLOFT ) 50 MG tablet Take 50 mg by mouth daily.   Yes [provider]  amLODipine  (NORVASC ) 10 MG tablet Take 10 mg by mouth daily.  Patient not taking: Reported on 04/15/2024 05/22/19   [provider]  carvedilol  (COREG ) 6.25 MG tablet Take 1 tablet (6.25 mg total) by mouth 2 (two) times daily with a meal. Patient not taking: Reported on 04/15/2024 01/03/23   Jillian Buttery, MD  ciprofloxacin  (CIPRO ) 500 MG tablet Take 500 mg by mouth 2 (two) times daily. Patient not taking: Reported on 04/15/2024 03/23/24   [provider]  lisinopril-hydrochlorothiazide (ZESTORETIC) 20-25 MG tablet Take by mouth. Patient not taking: Reported on 04/15/2024    [provider]      I spent 32 minutes caring for this patient today, including preparing to see the patient, obtaining  a medical history , reviewing a separately obtained history, performing a medically appropriate examination and/or evaluation, counseling and educating the patient/family/caregiver, ordering medications, tests, or procedures, referring and communicating with other health care professionals (not separately reported), documenting clinical information in the electronic health record, independently interpreting results (not separately reported/billed) and communicating results to the patient/family/caregiver, and care coordination (not separately reported/billed)

## 2024-04-18 ENCOUNTER — Inpatient Hospital Stay (HOSPITAL_COMMUNITY)

## 2024-04-18 DIAGNOSIS — J9601 Acute respiratory failure with hypoxia: Secondary | ICD-10-CM | POA: Diagnosis not present

## 2024-04-18 LAB — BODY FLUID CELL COUNT WITH DIFFERENTIAL
Eos, Fluid: 0 %
Lymphs, Fluid: 52 %
Monocyte-Macrophage-Serous Fluid: 17 % — ABNORMAL LOW (ref 50–90)
Neutrophil Count, Fluid: 30 % — ABNORMAL HIGH (ref 0–25)
Other Cells, Fluid: 1 %
Total Nucleated Cell Count, Fluid: 1380 uL — ABNORMAL HIGH (ref 0–1000)

## 2024-04-18 LAB — ALBUMIN, PLEURAL OR PERITONEAL FLUID: Albumin, Fluid: 1.6 g/dL

## 2024-04-18 LAB — CBC
HCT: 30 % — ABNORMAL LOW (ref 39.0–52.0)
Hemoglobin: 9.5 g/dL — ABNORMAL LOW (ref 13.0–17.0)
MCH: 28.1 pg (ref 26.0–34.0)
MCHC: 31.7 g/dL (ref 30.0–36.0)
MCV: 88.8 fL (ref 80.0–100.0)
Platelets: 297 K/uL (ref 150–400)
RBC: 3.38 MIL/uL — ABNORMAL LOW (ref 4.22–5.81)
RDW: 14.3 % (ref 11.5–15.5)
WBC: 6.9 K/uL (ref 4.0–10.5)
nRBC: 0 % (ref 0.0–0.2)

## 2024-04-18 LAB — C3 COMPLEMENT: C3 Complement: 139 mg/dL (ref 82–167)

## 2024-04-18 LAB — LACTATE DEHYDROGENASE, PLEURAL OR PERITONEAL FLUID: LD, Fluid: 269 U/L — ABNORMAL HIGH (ref 3–23)

## 2024-04-18 LAB — BASIC METABOLIC PANEL WITH GFR
Anion gap: 10 (ref 5–15)
BUN: 15 mg/dL (ref 8–23)
CO2: 24 mmol/L (ref 22–32)
Calcium: 8.4 mg/dL — ABNORMAL LOW (ref 8.9–10.3)
Chloride: 107 mmol/L (ref 98–111)
Creatinine, Ser: 1.26 mg/dL — ABNORMAL HIGH (ref 0.61–1.24)
GFR, Estimated: 60 mL/min — ABNORMAL LOW (ref >60)
Glucose, Bld: 117 mg/dL — ABNORMAL HIGH (ref 70–99)
Potassium: 3.7 mmol/L (ref 3.5–5.1)
Sodium: 141 mmol/L (ref 135–145)

## 2024-04-18 LAB — AMYLASE, PLEURAL OR PERITONEAL FLUID: Amylase, Fluid: 57 U/L

## 2024-04-18 LAB — RHEUMATOID FACTOR: Rheumatoid fact SerPl-aCnc: 20.8 [IU]/mL — ABNORMAL HIGH (ref <14.0)

## 2024-04-18 LAB — GLUCOSE, CAPILLARY
Glucose-Capillary: 124 mg/dL — ABNORMAL HIGH (ref 70–99)
Glucose-Capillary: 152 mg/dL — ABNORMAL HIGH (ref 70–99)
Glucose-Capillary: 149 mg/dL — ABNORMAL HIGH (ref 70–99)
Glucose-Capillary: 263 mg/dL — ABNORMAL HIGH (ref 70–99)

## 2024-04-18 LAB — PROTEIN, PLEURAL OR PERITONEAL FLUID: Total protein, fluid: 3.6 g/dL

## 2024-04-18 LAB — C4 COMPLEMENT: Complement C4, Body Fluid: 35 mg/dL (ref 12–38)

## 2024-04-18 LAB — CYCLIC CITRUL PEPTIDE ANTIBODY, IGG/IGA: CCP Antibodies IgG/IgA: 8 U (ref 0–19)

## 2024-04-18 LAB — GLUCOSE, PLEURAL OR PERITONEAL FLUID: Glucose, Fluid: 177 mg/dL

## 2024-04-18 LAB — PROCALCITONIN: Procalcitonin: 0.29 ng/mL

## 2024-04-18 NOTE — Progress Notes (Signed)
  Progress Note   Patient: Fred [name] FMW:[id number] DOB: April 10, 1951 DOA: 04/15/2024     3 DOS: the patient was seen and examined on 04/18/2024 at 8:48 AM      Brief hospital course: 73 year old man with cognitive impairment, lives in group home, recent pericardial effusion, CKD stage IIIb, DM who presented with new cough and work of breathing, found to have pleural effusion.     Assessment and Plan: Respiratory failure with hypoxia secondary to bilateral pleural effusion Suspected parapneumonic effusion Suspected right lower lobe pneumonia He has weaned to room air.  Analysis of pleural fluid suggest this is parapneumonic. - Continue antibiotics - Pulmonology plan thoracentesis of opposite side today, then postthoracentesis chest x-ray   Pericardial effusion Chronic systolic congestive heart failure  Acute CHF ruled out. - Repeat echo in 1 month - Outpatient cardiology follow-up - Continue colchicine   Hypertension Blood pressure normal off medication - Hold amlodipine , carvedilol , lisinopril, HCTZ  Chronic kidney disease stage IIIb Creatinine stable in baseline range 1-1.2  Diabetes -Continue Lipitor - Continue sliding scale corrections - Hold metformin  Chronic developmental delay - Continue Risperdal , sertraline , MiraLAX        Subjective: The patient gives of yes no answers but this does not seem reliable.  Nursing of no concerns.  They observed no respiratory distress, fever, cough, agitation, objective signs of pain.     Physical Exam: BP (!) 151/85 (BP Location: Left Arm)   Pulse (!) 108   Temp 98 F (36.7 C) (Oral)   Resp 18   Ht 5' 8 (1.727 m)   Wt 61.5 kg   SpO2 90%   BMI 20.60 kg/m   Thin adult male, sitting up in bed, playing on the deck of cards Tachycardic, regular, no murmurs, no peripheral edema Respiratory rate normal, lungs clear without rales or wheezes Abdomen soft no tenderness palpation or guarding, no ascites or  distention Attentive to questions, makes eye contact briefly, gives one-word answers, generally does not follow command    Data Reviewed: Basic metabolic panel shows creatinine up to 1.2, but stable at baseline range CBC shows hemoglobin slightly down to 9.5, white count normal Discussed with pulmonology    Family Communication: Sister by phone    Disposition: Status is: Inpatient         Author: Lonni SHAUNNA Dalton, MD 04/18/2024 2:50 PM  For on call review [email or web address].

## 2024-04-18 NOTE — TOC Progression Note (Signed)
 Transition of Care [facility]) - Progression Note    Patient Details  Name: Fred [name] MRN: [id number] Date of Birth: 1951/07/08  [facility] CM/SW Contact  Luise JAYSON Pan, [place] Phone Number: 04/18/2024, 2:05 PM  Clinical Narrative:   Per MD, patient can discharge tomorrow. Per [name] Molt, group home caregiver, he can pick patient up tomorrow. CSW to call Mr. Molt tomorrow.   CSW will continue to follow.    Expected Discharge Plan: Group Home Barriers to Discharge: Continued Medical Work up               Expected Discharge Plan and Services       Living arrangements for the past 2 months: Group Home                                       Social Drivers of Health (SDOH) Interventions SDOH Screenings   Food Insecurity: No Food Insecurity (04/16/2024)  Housing: Low Risk  (04/16/2024)  Transportation Needs: No Transportation Needs (04/16/2024)  Utilities: Not At Risk (04/16/2024)  Financial Resource Strain: Low Risk  (03/20/2024)   Received from [facility]  Physical Activity: Unknown (04/26/2023)   Received from [facility]  Social Connections: Unknown (04/16/2024)  Stress: No Stress Concern Present (04/26/2023)   Received from [facility]  Tobacco Use: Medium Risk (03/20/2024)   Received from [facility]    Readmission Risk Interventions     No data to display

## 2024-04-18 NOTE — Hospital Course (Signed)
 73 year old man with cognitive impairment, lives in group home, recent pericardial effusion, CKD stage IIIb, DM who presented with new cough and work of breathing, found to have pleural effusion.

## 2024-04-18 NOTE — Procedures (Signed)
 Thoracentesis  Procedure Note  Fred [name]  [id number]  [birth year]  Date:04/18/24  Time:7:15 PM   Provider Performing:Aniesa Boback N Jawon Dipiero   Procedure: Thoracentesis with imaging guidance (67444)  Indication(s) Pleural Effusion  Consent Risks of the procedure as well as the alternatives and risks of each were explained to the patient and/or caregiver.  Consent for the procedure was obtained and is signed in the bedside chart  Anesthesia Topical only with 1% lidocaine     Time Out Verified patient identification, verified procedure, site/side was marked, verified correct patient position, special equipment/implants available, medications/allergies/relevant history reviewed, required imaging and test results available.   Sterile Technique Maximal sterile technique including full sterile barrier drape, hand hygiene, sterile gown, sterile gloves, mask, hair covering, sterile ultrasound probe cover (if used).  Procedure Description Ultrasound was used to identify appropriate pleural anatomy for placement and overlying skin marked.  Area of drainage cleaned and draped in sterile fashion. Lidocaine  was used to anesthetize the skin and subcutaneous tissue.  1800 cc's of serosang appearing fluid was drained from the right pleural space. Catheter then removed and bandaid applied to site.   Complications/Tolerance None; patient tolerated the procedure well. Chest X-ray is ordered to confirm no post-procedural complication.   EBL Minimal   Specimen(s) Pleural fluid

## 2024-04-18 NOTE — Progress Notes (Signed)
 Mobility Specialist Progress Note:    04/18/24 0940  Mobility  Activity Ambulated with assistance  Level of Assistance Standby assist, set-up cues, supervision of patient - no hands on  Assistive Device None  Distance Ambulated (ft) 100 ft  Activity Response Tolerated well  Mobility Referral Yes  Mobility visit 1 Mobility  Mobility Specialist Start Time (ACUTE ONLY) 0940  Mobility Specialist Stop Time (ACUTE ONLY) 0951  Mobility Specialist Time Calculation (min) (ACUTE ONLY) 11 min   Pt agreeable to session. Needed little to no assist to move and ambulate. Pt needing cueing for speed walking a couple of times but otherwise alright. Returned pt to bed w/ all needs met.   Venetia Keel Mobility Specialist Please Neurosurgeon or Rehab Office at [phone]

## 2024-04-18 NOTE — Progress Notes (Signed)
 Assisted with right thoracentesis. Vitals pre/during/post procedure. Sister at bedside. Consent in chart. Specimen walked to lab. CXR ordered by MD.

## 2024-04-18 NOTE — Evaluation (Signed)
 Physical Therapy Evaluation Patient Details Name: Fred [name] MRN: [id number] DOB: 20-Nov-1950 Today's Date: 04/18/2024  History of Present Illness  Pt is a 73 y/o male admitted 8/18 throught the ER with SOB/respiratory failure.  Work up continues for acute hypoxic respiratory failure with large bil R>L pleural effusions and lage pericardial effusion.  PMH includes HF with moderately reduced EF, HTN, CKD III, DMII, BPH, intellectual disability.  Clinical Impression  Pt is likely close to baseline functioning and should be safe at group home with normal staff supervision. There are no further acute PT needs.  Continue with mobility team during this admission.  Will sign off at this time.         If plan is discharge home, recommend the following: A little help with bathing/dressing/bathroom;A little help with walking and/or transfers;Direct supervision/assist for medications management;Direct supervision/assist for financial management;Assist for transportation;Assistance with cooking/housework   Can travel by private vehicle        Equipment Recommendations None recommended by PT  Recommendations for Other Services       Functional Status Assessment Patient has had a recent decline in their functional status and/or demonstrates limited ability to make significant improvements in function in a reasonable and predictable amount of time     Precautions / Restrictions Precautions Precautions: Fall      Mobility  Bed Mobility Overal bed mobility: Needs Assistance Bed Mobility: Supine to Sit, Sit to Supine     Supine to sit: Contact guard Sit to supine: Contact guard assist   General bed mobility comments: pt moves without assist to/from sitting EOB, but doesn't follow commands well to position well in the bed.  CGA for safety    Transfers Overall transfer level: Needs assistance   Transfers: Sit to/from Stand Sit to Stand: Contact guard assist, Min assist            General transfer comment: assist for direction more than anything.  suspect there is a fall risk in a home-like environment    Ambulation/Gait Ambulation/Gait assistance: Min assist, Contact guard assist Gait Distance (Feet): 100 Feet (with/without min HHA)   Gait Pattern/deviations: Step-through pattern, Decreased step length - right, Decreased step length - left, Decreased stride length   Gait velocity interpretation: <1.31 ft/sec, indicative of household ambulator   General Gait Details: mildly unsteady steps overall, poor heel toe with forefoot or flat contact, flexed posture, moderately hight UE guarding.  Stairs            Wheelchair Mobility     Tilt Bed    Modified Rankin (Stroke Patients Only)       Balance Overall balance assessment: Needs assistance Sitting-balance support: No upper extremity supported Sitting balance-Leahy Scale: Good     Standing balance support: No upper extremity supported, Single extremity supported, During functional activity Standing balance-Leahy Scale: Fair                               Pertinent Vitals/Pain Pain Assessment Pain Assessment: Faces Faces Pain Scale: No hurt Pain Intervention(s): Monitored during session    Home Living Family/patient expects to be discharged to:: Group home Living Arrangements: Non-relatives/Friends;Group Home               Home Equipment:  (unable to get reliable information on use of AD)      Prior Function Prior Level of Function : Patient poor historian/Family not available  Extremity/Trunk Assessment   Upper Extremity Assessment Upper Extremity Assessment: Generalized weakness;Overall [facility] for tasks assessed    Lower Extremity Assessment Lower Extremity Assessment: Generalized weakness;Overall [facility] for tasks assessed    Cervical / Trunk Assessment Cervical / Trunk Assessment: Normal  Communication   Communication Communication:  Impaired Factors Affecting Communication: Difficulty expressing self    Cognition Arousal: Alert Behavior During Therapy: Flat affect, WFL for tasks assessed/performed   PT - Cognitive impairments: History of cognitive impairments                         Following commands: Impaired Following commands impaired: Follows one step commands with increased time     Cueing Cueing Techniques: Gestural cues, Tactile cues, Verbal cues     General Comments General comments (skin integrity, edema, etc.): VSS, BP 138/79  irregular HR in the low 100's, Sats on RA upper 90's%    Exercises     Assessment/Plan    PT Assessment Patient needs continued PT services  PT Problem List Decreased strength;Decreased activity tolerance;Decreased balance;Decreased mobility;Cardiopulmonary status limiting activity       PT Treatment Interventions Gait training;DME instruction;Stair training;Functional mobility training;Therapeutic activities    PT Goals (Current goals can be found in the Care Plan section)  Acute Rehab PT Goals PT Goal Formulation: Patient unable to participate in goal setting Potential to Achieve Goals: Fair    Frequency Min 2X/week     Co-evaluation               AM-PAC PT 6 Clicks Mobility  Outcome Measure Help needed turning from your back to your side while in a flat bed without using bedrails?: None Help needed moving from lying on your back to sitting on the side of a flat bed without using bedrails?: None Help needed moving to and from a bed to a chair (including a wheelchair)?: A Little Help needed standing up from a chair using your arms (e.g., wheelchair or bedside chair)?: A Little Help needed to walk in hospital room?: A Little Help needed climbing 3-5 steps with a railing? : A Little 6 Click Score: 20    End of Session     Patient left: in bed;with call bell/phone within reach;with bed alarm set Nurse Communication: Mobility status PT  Visit Diagnosis: Unsteadiness on feet (R26.81);Muscle weakness (generalized) (M62.81)    Time: 8947-8886 PT Time Calculation (min) (ACUTE ONLY): 21 min   Charges:   PT Evaluation $PT Eval Moderate Complexity: 1 Mod   PT General Charges $$ ACUTE PT VISIT: 1 Visit         04/18/2024  India HERO., PT Acute Rehabilitation Services [phone]  (office)  Vinie GAILS Annjeanette Sarwar 04/18/2024, 11:28 AM

## 2024-04-18 NOTE — Progress Notes (Signed)
 NAME:  Fred [name], MRN:  [id number], DOB:  [birth year], LOS: 3 ADMISSION DATE:  04/15/2024, CONSULTATION DATE:  8/18 REFERRING MD:  Elnor, CHIEF COMPLAINT:  acute respiratory failure    History of Present Illness:  This is a 73 year old male patient who resides at a group home.  At baseline he is ambulatory without oxygen, requires assistance with ADLs, not able to manage household.  Speaks in 1-2 word phrases.  Recently discharged on 7/16 after hospital admission where he was admitted for initially shortness of breath and leg swelling, found to have a large pericardial effusion with early tamponade physiology.  Ended up He underwent pericardial drain placement on 7/12.  Diagnostics were inconclusive as to etiology of the pericardial effusion.  His sed rate was 32, so felt low for pericarditis but cardiology could not rule out.  Was placed on colchicine  with plan to complete 65-month duration.  During that hospital admission his EF was estimated 45 to 50%  He had been at the group home since discharge.  Has been in his usual state of health per his caregivers, however staff did notice a new onset of a cough on 8/17.  There is no note change of behavior, no fevers, cough later in the afternoon seemed to subside but then in the a.m. hours on 8/18 he was found by staff with increased work of breathing.  He was moaning, and looked acutely ill per staff and therefore EMS was called.  On arrival pulse oximetry on room air was 75% he was placed on a nonrebreather mask at 15 L and transferred to the emergency room for further evaluation  Diagnostic evaluation in the emergency room identified the following: His respiratory viral panel was negative white blood cell count normal at 7.7 hemoglobin 10.1 BNP was normal at 55.6 troponin I was negative serum bicarbonate 21 serum creatinine 1.21 from baseline of 1.28-1.4, venous arterial blood gas obtained showed pH of 7.38 pCO2 of 39 HCO3 of 23 lactic acid was negative.   A portable chest x-ray was obtained and this showed a large right greater than left pleural effusion.  Bilateral pulmonary edema a follow-up CT angiogram was obtained this was negative for pulmonary embolus but did indeed show large right greater than left pleural effusion, also once again demonstrated a moderate-sized pericardial effusion.  He was placed on noninvasive positive pressure ventilation for his work of breathing.  Cultures were sent, he was administered empiric antibiotics as well as IV Lasix .  Cardiology was consulted given the findings of the CT scan in regards to the pericardial effusion and a stat echocardiogram was completed while in the ER.  Because of his increased work of breathing, and large pleural effusions as well as pericardial effusion, pulmonary was asked to evaluate for #1 appropriateness for hospital admission to the ICU and #2 possibility of therapeutic and diagnostic thoracentesis.  On arrival he was initially fairly agitated on BiPAP while staff was obtaining echocardiogram this is since settled down.   Pertinent  Medical History  Cognitive delay, type 2 diabetes, hypertension, BPH, recent hospital admission for large pericardial effusion with tamponade features requiring drainage  Significant Hospital Events: Including procedures, antibiotic start and stop dates in addition to other pertinent events   8/18 admitted with acute hypoxic respiratory failure, pericardial effusion, and large right greater than left pleural effusions with compression atelectasis  Interim History / Subjective:  Seems a little more calm since hospital admission  Objective    Blood pressure 135/68, pulse ROLLEN)  108, temperature 98 F (36.7 C), temperature source Axillary, resp. rate (!) 24, height 5' 8 (1.727 m), weight 61.5 kg, SpO2 98%.        Intake/Output Summary (Last 24 hours) at 04/18/2024 1916 Last data filed at 04/18/2024 1817 Gross per 24 hour  Intake 220 ml  Output 3750 ml  Net  -3530 ml   Filed Weights   04/16/24 1424 04/17/24 0349 04/18/24 0436  Weight: 62.7 kg 62.5 kg 61.5 kg    Examination: Physical Exam Constitutional:      General: He is not in acute distress.    Appearance: He is not ill-appearing.     Comments: Eating breakfast  HENT:     Head: Normocephalic.  Cardiovascular:     Rate and Rhythm: Normal rate and regular rhythm.  Pulmonary:     Comments: Decreased breath sounds bilaterally. Poor air entry at both lung bases  Abdominal:     General: Bowel sounds are normal.     Palpations: Abdomen is soft.  Musculoskeletal:        General: Normal range of motion.     Cervical back: Normal range of motion.  Skin:    General: Skin is warm.     Capillary Refill: Capillary refill takes less than 2 seconds.  Neurological:     General: No focal deficit present.     Mental Status: He is alert.     Comments: At baseline mental status, has some cognitive impairment   Bedside US  of lungs   Right lung US     Left lung US      Resolved problem list   Assessment and Plan    Resp  #Acute hypoxic respiratory failure secondary to large bilateral right greater than left pleural effusions (improved) #Possible pneumonia vs compression atelectasis  #Neutrophil predominant right pleural effusion, could still be parapneumonic/infectious based on imaging with atelectatic right lung    Appears improved post thoracentesis  - Cover with 5 days of antibiotics for presumed bacterial pneumonia. CTX and azithro, MRSA screen negative   Pleural and pericardial effusion could be related to inflammatory disease including rheumatoid, SLE, adult Still's, Vasculitis  Workup negative so far   I suspect if cytology negative, auto immune work up is negative that his effusion could be related to inflmamation from pericarditis or parapneumonic with an element of volume overload.  Effusion is exudative with neutrophil predominance. He is currently comfortable on room air  without any evidence of respiratory distress.   Bedside US  today showing bilateral pleural effusions larger on the right. Sediment present on both effusions suggestive of similar inflammatory process.   Thoracentesis performed today and 1800cc of fluid removed. He will follow up in the pulmonary clinic    Best Practice (right click and Reselect all SmartList Selections daily)   Per Primary    Labs   CBC: Recent Labs  Lab 04/15/24 1000 04/15/24 1013 04/16/24 0257 04/18/24 0246  WBC 7.7  --  7.9 6.9  NEUTROABS 6.7  --   --   --   HGB 10.1* 10.5*  10.5* 9.2* 9.5*  HCT 32.3* 31.0*  31.0* 28.1* 30.0*  MCV 91.2  --  87.5 88.8  PLT 228  --  251 297    Basic Metabolic Panel: Recent Labs  Lab 04/15/24 1000 04/15/24 1013 04/16/24 0257 04/17/24 1054 04/18/24 0246  NA 134* 136  135 139 141 141  K 3.8 3.8  3.8 3.7 4.0 3.7  CL 99 101 105 107 107  CO2  21*  --  23 25 24   GLUCOSE 183* 184* 84 165* 117*  BUN 16 17 19 15 15   CREATININE 1.21 1.10 1.46* 1.17 1.26*  CALCIUM  8.3*  --  8.2* 8.6* 8.4*   GFR: Estimated Creatinine Clearance: 44.7 mL/min (A) (by C-G formula based on SCr of 1.26 mg/dL (H)). Recent Labs  Lab 04/15/24 1000 04/15/24 1341 04/15/24 1712 04/16/24 0257 04/17/24 0244 04/18/24 0246  PROCALCITON  --   --  0.68 0.81 0.51 0.29  WBC 7.7  --   --  7.9  --  6.9  LATICACIDVEN  --  1.7  --   --   --   --     Liver Function Tests: Recent Labs  Lab 04/15/24 1000  AST 37  ALT 28  ALKPHOS 66  BILITOT 0.6  PROT 6.5  ALBUMIN 2.6*   No results for input(s): LIPASE, AMYLASE in the last 168 hours. No results for input(s): AMMONIA in the last 168 hours.  ABG    Component Value Date/Time   HCO3 23.4 04/15/2024 1013   TCO2 25 04/15/2024 1013   TCO2 22 04/15/2024 1013   ACIDBASEDEF 1.0 04/15/2024 1013   O2SAT 92 04/15/2024 1013     Coagulation Profile: No results for input(s): INR, PROTIME in the last 168 hours.  Cardiac Enzymes: No  results for input(s): CKTOTAL, CKMB, CKMBINDEX, TROPONINI in the last 168 hours.  HbA1C: Hgb A1c MFr Bld  Date/Time Value Ref Range Status  04/15/2024 03:58 PM 5.3 4.8 - 5.6 % Final    Comment:    (NOTE) Diagnosis of Diabetes The following HbA1c ranges recommended by the American Diabetes Association (ADA) may be used as an aid in the diagnosis of diabetes mellitus.  Hemoglobin             Suggested A1C NGSP%              Diagnosis  <5.7                   Non Diabetic  5.7-6.4                Pre-Diabetic  >6.4                   Diabetic  <7.0                   Glycemic control for                       adults with diabetes.    11/02/2022 06:20 AM 5.5 4.8 - 5.6 % Final    Comment:    (NOTE)         Prediabetes: 5.7 - 6.4         Diabetes: >6.4         Glycemic control for adults with diabetes: <7.0     CBG: Recent Labs  Lab 04/17/24 1529 04/17/24 2059 04/18/24 0555 04/18/24 1132 04/18/24 1624  GLUCAP 172* 151* 124* 152* 149*    Review of Systems:   Not able   Past Medical History:  He,  has a past medical history of Acute UTI (urinary tract infection) (01/01/2023), AKI (acute kidney injury) (HCC) (11/02/2022), Diabetes mellitus, Hypertension, Mental retardation, and Severe sepsis (HCC) (11/02/2022).   Surgical History:   Past Surgical History:  Procedure Laterality Date   PERICARDIOCENTESIS N/A 03/09/2024   Procedure: PERICARDIOCENTESIS;  Surgeon: [place], Peter M, MD;  Location: [facility] INVASIVE CV LAB;  Service: Cardiovascular;  Laterality: N/A;     Social History:   reports that he quit smoking about 7 years ago. His smoking use included cigarettes. He has never used smokeless tobacco. He reports that he does not drink alcohol and does not use drugs.   Family History:  His Family history is unknown by patient.   Allergies No Known Allergies   Home Medications  Prior to Admission medications   Medication Sig Start Date End Date Taking?  Authorizing Provider  alfuzosin  (UROXATRAL ) 10 MG 24 hr tablet Take 10 mg by mouth daily after breakfast.  03/30/20  Yes [provider]  ALLERGY RELIEF 10 MG tablet Take 10 mg by mouth daily.   Yes [provider]  aspirin  EC 81 MG tablet Take 81 mg by mouth daily.   Yes [provider]  atorvastatin  (LIPITOR) 20 MG tablet Take 20 mg by mouth daily.  02/28/18  Yes [provider]  benztropine  (COGENTIN ) 0.5 MG tablet Take 0.5 mg by mouth daily.    Yes [provider]  colchicine  0.6 MG tablet Take 1 tablet (0.6 mg total) by mouth daily. 03/14/24  Yes Smucker, Melvenia, MD  ferrous sulfate  325 (65 FE) MG tablet Take 325 mg by mouth every Monday, Wednesday, and Friday.  03/27/18  Yes [provider]  ketoconazole (NIZORAL) 2 % shampoo Apply 1 Application topically 2 (two) times a week.   Yes [provider]  metFORMIN (GLUCOPHAGE) 500 MG tablet Take 500 mg by mouth 2 (two) times daily.  01/20/15  Yes [provider]  potassium chloride  (KLOR-CON ) 10 MEQ tablet Take 10 mEq by mouth daily.   Yes [provider]  risperiDONE  (RISPERDAL ) 0.5 MG tablet Take 0.5 mg by mouth daily.   Yes [provider]  risperiDONE  (RISPERDAL ) 3 MG tablet Take 3 mg by mouth at bedtime. 05/16/19  Yes [provider]  sertraline  (ZOLOFT ) 50 MG tablet Take 50 mg by mouth daily.   Yes [provider]  amLODipine  (NORVASC ) 10 MG tablet Take 10 mg by mouth daily.  Patient not taking: Reported on 04/15/2024 05/22/19   [provider]  carvedilol  (COREG ) 6.25 MG tablet Take 1 tablet (6.25 mg total) by mouth 2 (two) times daily with a meal. Patient not taking: Reported on 04/15/2024 01/03/23   Jillian Buttery, MD  ciprofloxacin  (CIPRO ) 500 MG tablet Take 500 mg by mouth 2 (two) times daily. Patient not taking: Reported on 04/15/2024 03/23/24   [provider]  lisinopril-hydrochlorothiazide (ZESTORETIC) 20-25 MG tablet  Take by mouth. Patient not taking: Reported on 04/15/2024    [provider]      I spent 32 minutes caring for this patient today, including preparing to see the patient, obtaining a medical history , reviewing a separately obtained history, performing a medically appropriate examination and/or evaluation, counseling and educating the patient/family/caregiver, ordering medications, tests, or procedures, referring and communicating with other health care professionals (not separately reported), documenting clinical information in the electronic health record, independently interpreting results (not separately reported/billed) and communicating results to the patient/family/caregiver, and care coordination (not separately reported/billed)

## 2024-04-19 ENCOUNTER — Inpatient Hospital Stay (HOSPITAL_COMMUNITY)

## 2024-04-19 DIAGNOSIS — J90 Pleural effusion, not elsewhere classified: Principal | ICD-10-CM

## 2024-04-19 DIAGNOSIS — J9601 Acute respiratory failure with hypoxia: Secondary | ICD-10-CM | POA: Diagnosis not present

## 2024-04-19 LAB — PATHOLOGIST SMEAR REVIEW

## 2024-04-19 LAB — GLUCOSE, CAPILLARY
Glucose-Capillary: 123 mg/dL — ABNORMAL HIGH (ref 70–99)
Glucose-Capillary: 119 mg/dL — ABNORMAL HIGH (ref 70–99)
Glucose-Capillary: 164 mg/dL — ABNORMAL HIGH (ref 70–99)
Glucose-Capillary: 234 mg/dL — ABNORMAL HIGH (ref 70–99)

## 2024-04-19 NOTE — Progress Notes (Signed)
  Progress Note   Patient: Fred [name] FMW:[id number] DOB: 1951/03/11 DOA: 04/15/2024     4 DOS: the patient was seen and examined on 04/19/2024 at 8AM       Brief hospital course: 73 year old man with cognitive impairment, lives in group home, recent pericardial effusion, CKD stage IIIb, DM who presented with new cough and work of breathing, found to have pleural effusion.     Assessment and Plan: Respiratory failure Effusion Pneumonia CTA on admission showed large loculated right and mod-large left effusions with lymphadenopathy, can't rule out malignancy.   Tap of 1500cc on 8/18 on right and 1800cc on 8/21 again from right.  Serum labs not sent by Pulm, so can't formally assess Light's criteria but with high protein and LDH in pleural fluid, transudate effectively ruled out.  Cytology sent on first tap (negative), not second.  Procalcitonin elevated but not markedly so.  CCP Ab, ANA, anti-DNA, complements, ANCA all negative.  Rheumatoid factor weakly positive, but not specific with setting of possible infection.  Remains febrile and tachycardic.   - Continue antibiotics - Pulmonary toilet - Repeat chest imaging   Pericardial effusion Chronic systolic congestive heart failure Acute CHF ruled out Still present based on CT chest.  CT surgery consulted, recommended no window. - Continue colchicine  -Repeat echo in 1 month - Outpatient cardiology follow-up  Cognitive impairment Significant cognitive impairment confounds diagnosis terribly. -Continue sertraline , risperidone , MiraLAX    Chronic kidney disease stage IIIb Baseline 1-1.2, stable  Type 2 diabetes - Continue sliding scale corrections - Continue Lipitor - Hold metformin  Hypertension Blood pressure normal off medication - Stop amlodipine , carvedilol , lisinopril, HCTZ  BPH - Continue alfuzosin         Subjective: Febrile overnight.  Still tachycardic.  Nursing note no changes.  Patient is not able to  volunteer any symptoms.     Physical Exam: BP 115/78 (BP Location: Left Arm)   Pulse 94   Temp 98.4 F (36.9 C) (Oral)   Resp 19   Ht 5' 8 (1.727 m)   Wt 62.9 kg   SpO2 100%   BMI 21.08 kg/m   Thin adult male, sitting up in bed, has a deck of cards in his hands Tachycardic, regular, no murmurs, no pitting edema in the extremities Respiratory rate seems normal, he has rales on the right, he has very diminished on both bases He makes eye contact briefly, then stares away, he has chronic cognitive impairment, seems mentally at baseline, seems to be manipulating the cards well, does not follow commands well, seems to have normal symmetric upper extremity movements  Data Reviewed: Discussed with pulmonology Chest x-ray personally reviewed, shows persistent moderate to large pleural effusions      Family Communication: Sister    Disposition: Status is: Inpatient         Author: Lonni SHAUNNA Dalton, MD 04/19/2024 3:26 PM  For on call review [email or web address].

## 2024-04-19 NOTE — TOC Progression Note (Signed)
 Transition of Care [facility]) - Progression Note    Patient Details  Name: Fred [name] MRN: [id number] Date of Birth: 1951-04-15  [facility] CM/SW Contact  Luise JAYSON Pan, [place] Phone Number: 04/19/2024, 11:55 AM  Clinical Narrative:   Per MD, patient running a fever. No longer discharging today. CSW updated patients sister, [name].   11:56 AM CSW called group home caregiver, Mr. Joshua, to update him on patient not discharging. He stated okay. CSW inquired if Mr. Joshua could transport patient over the weekend if patient is medically ready. Mr. Joshua stated yes, please call [phone].  CSW will continue to follow.    Expected Discharge Plan: Group Home Barriers to Discharge: Continued Medical Work up               Expected Discharge Plan and Services       Living arrangements for the past 2 months: Group Home Expected Discharge Date: 04/19/24                                     Social Drivers of Health (SDOH) Interventions SDOH Screenings   Food Insecurity: No Food Insecurity (04/16/2024)  Housing: Low Risk  (04/16/2024)  Transportation Needs: No Transportation Needs (04/16/2024)  Utilities: Not At Risk (04/16/2024)  Financial Resource Strain: Low Risk  (03/20/2024)   Received from [facility]  Physical Activity: Unknown (04/26/2023)   Received from [facility]  Social Connections: Unknown (04/16/2024)  Stress: No Stress Concern Present (04/26/2023)   Received from [facility]  Tobacco Use: Medium Risk (03/20/2024)   Received from [facility]    Readmission Risk Interventions     No data to display

## 2024-04-19 NOTE — Consult Note (Addendum)
 ADMISSION DATE:  04/15/2024  SUBJECTIVE: Doing well. On RA. Denies any symptoms.   REVIEW OF SYSTEMS:   Pertinent ROS as above.   VITAL SIGNS: Temp:  [98 F (36.7 C)-100.5 F (38.1 C)] 98.4 F (36.9 C) (08/22 1149) Pulse Rate:  [94-112] 94 (08/22 1149) Resp:  [17-24] 19 (08/22 1149) BP: (114-157)/(68-105) 115/78 (08/22 1149) SpO2:  [92 %-100 %] 100 % (08/22 1149) Weight:  [62.9 kg] 62.9 kg (08/22 0607)  PHYSICAL EXAMINATION: General: not in distress patient appearing cachetic.  Lungs: clear to auscultation bilaterally.  Heart: regular rate rhythm, no murmur appreciated.  Abdomen: non tender, non distended. Normal BS.  Neuro: axo. Moving all extremities.    Recent Labs  Lab 04/16/24 0257 04/17/24 1054 04/18/24 0246  NA 139 141 141  K 3.7 4.0 3.7  CL 105 107 107  CO2 23 25 24   BUN 19 15 15   CREATININE 1.46* 1.17 1.26*  GLUCOSE 84 165* 117*   Recent Labs  Lab 04/15/24 1000 04/15/24 1013 04/16/24 0257 04/18/24 0246  HGB 10.1* 10.5*  10.5* 9.2* 9.5*  HCT 32.3* 31.0*  31.0* 28.1* 30.0*  WBC 7.7  --  7.9 6.9  PLT 228  --  251 297   DG CHEST PORT 1 VIEW Result Date: 04/18/2024 CLINICAL DATA:  Pleural effusion EXAM: PORTABLE CHEST 1 VIEW COMPARISON:  04/17/2024 FINDINGS: Right pleural effusion has diminished from prior exam. Left pleural effusion is similar. Associated bibasilar opacities, likely atelectasis. Stable cardiomegaly. Slight vascular congestion. No pneumothorax. IMPRESSION: 1. Right pleural effusion has diminished from prior exam. Left pleural effusion is similar. 2. Stable cardiomegaly. Slight vascular congestion. Electronically Signed   By: Andrea Gasman M.D.   On: 04/18/2024 22:29   DG CHEST PORT 1 VIEW Result Date: 04/17/2024 CLINICAL DATA:  Shortness of breath EXAM: PORTABLE CHEST 1 VIEW COMPARISON:  Chest x-ray 04/16/2024 FINDINGS: The heart is enlarged. There are atherosclerotic calcifications of the aorta. There is a moderate right pleural  effusion and small left pleural effusion, unchanged. No new lung infiltrate. There is no evidence for pneumothorax or acute fracture. IMPRESSION: Stable moderate right and small left pleural effusions. Electronically Signed   By: Greig Pique M.D.   On: 04/17/2024 18:55    STUDIES:  CXR/CT scan/ECHO  Reviewed.   CXR post thoracentesis shows residual b/l pleff. No such pleff in 2024.  CT chest reviewed which showed R>L pleff w some GGO in upper lobes.  ECHO w normal EF, GI DD.   ASSESSMENT / PLAN:  50 M who presented w sob. Prior had pericard effusion w tamponade physiology which resolved after drain in place. Diagnostic results inconclusive. Rx w colchicine . Had recurrence of SOB and hypoxic. Imaging studies showing R>L pleff w pericardial effuison. S/p Right thora on 8/18 removal of 1500 cc. Exudative and neutrophil pred. Cultures neg. Cytology neg. Left thora 8/21 lymph pred exudative (was on abx at this point). Cytology pending.  - not a cardiac window candidate per surgery.   - currently asymptomatic.  - complete abx course.  - pleural effusion likely a sequelae of tamponade physiology which subsequently might have gotten infected. However, this is not certain. Systemic cause of both pericardial and pleural effuison is a possibility with differential as outlined in previous notes.  - we will follow peripherally. Can dc from pulm standpoint with follow up appointment. His cytology will be followed in the clinic if dc.   Sammi JONETTA Fredericks, MD Pulmonary, Critical Care & Sleep Medicine  [facility] Pager: [phone]  04/19/2024, 2:41 PM   Addendum: Thoracentesis on 8/21- no cyto sent. Pulm will sign off. Follow up scheduled with Dr Zaida in [place].

## 2024-04-19 NOTE — TOC Transition Note (Deleted)
 Transition of Care [facility]) - Discharge Note   Patient Details  Name: Fred [name] MRN: [id number] Date of Birth: 07-29-1951  Transition of Care [facility]) CM/SW Contact:  Luise JAYSON Pan, LCSWA Phone Number: 04/19/2024, 11:45 AM   Clinical Narrative:   Patient will DC to: [facility]  Anticipated DC date: 04/19/24  Family notified: [name] (sis) Transport by: [place]   Per MD patient ready for DC to [facility]. RN, patient, patient's family, and facility notified of DC. Per patients sister, [name], Mr.Jones (pts caregiver) will not be available to pick patient up until 3PM. Bedside RN, MD and unit secretary notified.   CSW will sign off for now as social work intervention is no longer needed. Please consult us  again if new needs arise.      Final next level of care: Group Home Barriers to Discharge: Barriers Resolved   Patient Goals and CMS Choice            Discharge Placement              Patient chooses bed at: Other - please specify in the comment section below: (Group Home) Patient to be transferred to facility by: [name] Molt - caregiver Name of family member notified: [name]  (Sister) Patient and family notified of of transfer: 04/19/24  Discharge Plan and Services Additional resources added to the After Visit Summary for                                       Social Drivers of Health (SDOH) Interventions SDOH Screenings   Food Insecurity: No Food Insecurity (04/16/2024)  Housing: Low Risk  (04/16/2024)  Transportation Needs: No Transportation Needs (04/16/2024)  Utilities: Not At Risk (04/16/2024)  Financial Resource Strain: Low Risk  (03/20/2024)   Received from [facility]  Physical Activity: Unknown (04/26/2023)   Received from [facility]  Social Connections: Unknown (04/16/2024)  Stress: No Stress Concern Present (04/26/2023)   Received from [facility]  Tobacco Use: Medium Risk (03/20/2024)   Received from [facility]      Readmission Risk Interventions     No data to display

## 2024-04-19 NOTE — Care Management Important Message (Signed)
 Important Message  Patient Details  Name: Fred [name] MRN: [id number] Date of Birth: [birth year]   Important Message Given:  Yes - Medicare IM     [name] 04/19/2024, 10:39 AM

## 2024-04-20 ENCOUNTER — Other Ambulatory Visit (HOSPITAL_COMMUNITY): Payer: Self-pay

## 2024-04-20 DIAGNOSIS — J9601 Acute respiratory failure with hypoxia: Secondary | ICD-10-CM | POA: Diagnosis not present

## 2024-04-20 LAB — BASIC METABOLIC PANEL WITH GFR
Anion gap: 4 — ABNORMAL LOW (ref 5–15)
BUN: 14 mg/dL (ref 8–23)
CO2: 26 mmol/L (ref 22–32)
Calcium: 8.1 mg/dL — ABNORMAL LOW (ref 8.9–10.3)
Chloride: 110 mmol/L (ref 98–111)
Creatinine, Ser: 1.18 mg/dL (ref 0.61–1.24)
GFR, Estimated: >60 mL/min (ref >60)
Glucose, Bld: 134 mg/dL — ABNORMAL HIGH (ref 70–99)
Potassium: 3.8 mmol/L (ref 3.5–5.1)
Sodium: 140 mmol/L (ref 135–145)

## 2024-04-20 LAB — CULTURE, BLOOD (ROUTINE X 2)
Culture: NO GROWTH
Culture: NO GROWTH

## 2024-04-20 LAB — CBC
HCT: 30.8 % — ABNORMAL LOW (ref 39.0–52.0)
Hemoglobin: 9.6 g/dL — ABNORMAL LOW (ref 13.0–17.0)
MCH: 28.1 pg (ref 26.0–34.0)
MCHC: 31.2 g/dL (ref 30.0–36.0)
MCV: 90.1 fL (ref 80.0–100.0)
Platelets: 313 K/uL (ref 150–400)
RBC: 3.42 MIL/uL — ABNORMAL LOW (ref 4.22–5.81)
RDW: 14.1 % (ref 11.5–15.5)
WBC: 6.7 K/uL (ref 4.0–10.5)
nRBC: 0 % (ref 0.0–0.2)

## 2024-04-20 LAB — GLUCOSE, CAPILLARY
Glucose-Capillary: 114 mg/dL — ABNORMAL HIGH (ref 70–99)
Glucose-Capillary: 166 mg/dL — ABNORMAL HIGH (ref 70–99)

## 2024-04-20 LAB — CULTURE, BODY FLUID W GRAM STAIN -BOTTLE: Culture: NO GROWTH

## 2024-04-20 MED ORDER — AMOXICILLIN-POT CLAVULANATE 875-125 MG PO TABS
1.0000 | ORAL_TABLET | Freq: Two times a day (BID) | ORAL | 0 refills | Status: DC
  Filled 2024-04-20: qty 10, 5d supply, fill #0

## 2024-04-20 NOTE — Progress Notes (Signed)
 Mobility Specialist Progress Note:   04/20/24 1121  Mobility  Activity Ambulated with assistance  Level of Assistance Contact guard assist, steadying assist  Assistive Device Other (Comment) (HHA)  Distance Ambulated (ft) 100 ft  Activity Response Tolerated well  Mobility Referral Yes  Mobility visit 1 Mobility  Mobility Specialist Start Time (ACUTE ONLY) 1121  Mobility Specialist Stop Time (ACUTE ONLY) 1132  Mobility Specialist Time Calculation (min) (ACUTE ONLY) 11 min   Pt agreeable to mobility session. Required CGA via HHA to ambulate. No c/o throughout. Pt back in bed with all needs met.   Therisa Rana Mobility Specialist Please contact via SecureChat or  Rehab office at [phone]

## 2024-04-20 NOTE — TOC Progression Note (Signed)
 Transition of Care [facility]) - Progression Note    Patient Details  Name: Fred [name] MRN: [id number] Date of Birth: 1951/04/25  Transition [facility]) CM/SW Contact  Marval Gell, RN Phone Number: 04/20/2024, 10:36 AM  Clinical Narrative:     Patient will DC to group home, CSW Isaiah aware, CSW to set up DC to group home.  Requested by MD to set up [facility] RN.  Spoke with group home care giver preferred not to use [facility] again, per notes sister prefers [facility] or [place] so referral made to [place] for [facility] RN, disease and medication management. Mr Joshua states that the nurse already called him and he will be at the hospital at noon to pick up patient.   Expected Discharge Plan: Group Home Barriers to Discharge: No Barriers Identified               Expected Discharge Plan and Services       Living arrangements for the past 2 months: Group Home Expected Discharge Date: 04/20/24                         [facility] Arranged: RN HH Agency: [facility] Date [facility] Agency Contacted: 04/20/24 Time HH Agency Contacted: 1036 Representative spoke with at [facility] Agency: Darleene   Social Drivers of Health (SDOH) Interventions SDOH Screenings   Food Insecurity: No Food Insecurity (04/16/2024)  Housing: Low Risk  (04/16/2024)  Transportation Needs: No Transportation Needs (04/16/2024)  Utilities: Not At Risk (04/16/2024)  Financial Resource Strain: Low Risk  (03/20/2024)   Received from [facility]  Physical Activity: Unknown (04/26/2023)   Received from [facility]  Social Connections: Unknown (04/16/2024)  Stress: No Stress Concern Present (04/26/2023)   Received from [facility]  Tobacco Use: Medium Risk (03/20/2024)   Received from [facility]    Readmission Risk Interventions     No data to display

## 2024-04-20 NOTE — Plan of Care (Signed)
  Problem: Education: Goal: Ability to describe self-care measures that may prevent or decrease complications (Diabetes Survival Skills Education) will improve Outcome: Not Progressing   Problem: Coping: Goal: Ability to adjust to condition or change in health will improve Outcome: Progressing

## 2024-04-20 NOTE — Discharge Summary (Signed)
 Physician Discharge Summary   Patient: Fred [name] MRN: [id number] DOB: [birth year]  Admit date:     04/15/2024  Discharge date: 04/20/24  Discharge Physician: Lonni SHAUNNA Dalton   PCP: Pura Lenis, MD     Recommendations at discharge:  Follow up with Pulmonology Dr. Zaida on 9/18 for pleural effusion Follow up with Cardiology Dr. Fredrica for pericardial effusion within 2 weeks Dr. Zaida: Please repeat appropriate chest imaging to rule in/out reaccumulation of pleural fluid Dr. Fredrica: Please repeat echo at appropriate interval to rule out reaccumulation of pericardial fluid (see below)     Discharge Diagnoses: Principal Problem:   Respiratory failure, acute (HCC) Active Problems:   BPH with urinary obstruction   Hypertension associated with diabetes (HCC)   Intellectual disability   Type 2 diabetes mellitus without complication, without long-term current use of insulin  (HCC)   Stage 3a chronic kidney disease (CKD) (HCC) - baseline SCr 1.3   Anemia     Hospital Course: 73 year old man with cognitive impairment, lives in group home, recent pericardial effusion, CKD stage IIIb, DM who presented with new cough and work of breathing, found to have pleural effusion.  In the ER he was found to have SpO2 75%, in respiratory distress so placed on BiPAP.  CTA showed large loculated right pleural effusion and moderate left.  Admitted to ICU and underwent thoracentesis.     Acute respiratory failure with hypoxia secondary to bilateral pleural effusions Suspected parapneumonic effusion Possible right lower lobe pneumonia CTA on admission showed large loculated right and mod-large left effusions with lymphadenopathy, can't rule out malignancy.    Tap of 1500cc on 8/18 on right and 1800cc on 8/21 again from right.  Serum labs not sent by Pulm, so can't formally assess Light's criteria but with high protein and LDH in pleural fluid, transudate effectively ruled out.   Cytology sent on first tap (negative), not second.  Procalcitonin elevated but not markedly so.  CCP Ab, ANA, anti-DNA, complements, ANCA all negative.  Rheumatoid factor weakly positive, but not specific with setting of possible infection.   He improved clinically and was cleared by Pulmonology.  Has pulmonology follow up arranged.  Discharged on 5 more days oral antibiotics.     Pericardial effusion Chronic systolic congestive heart failure Diagnosed incidentally in May 2025.  Patient was noted by caretakers to have decreased activity, taken to the ER where a CT of the abdomen and pelvis incidentally Showed a moderate pericardial effusion.  This was being monitored by his Cardiologist Dr. Fredrica monthly and at July office visit echo in the office showed worsening with some RA collapse and so he was sent to the ER.    Admitted to ICU, pericardial drain placed 7/12, cultures and cytology negative, high cell count but mostly monocytes.  Started on 3 mo colchicine .  During this admission, Cardiology again consulted given reaccumulation of pericardial fluid and CT surgery consulted, who recommended against pericardial window given their assessment was that the effusion was not large enough in size to warrant window.   - Recommend close follow up with Cardiology - Continue colchicine              [place]  Controlled Substances Registry was reviewed for this patient prior to discharge.  Consultants: Pulmonology Cardiology Cardiothoracic surgery    Disposition: Home Diet recommendation:  Regular diet  DISCHARGE MEDICATION: Allergies as of 04/20/2024   No Known Allergies      Medication List     PAUSE taking these  medications    amLODipine  10 MG tablet Wait to take this until your doctor or other care provider tells you to start again. Commonly known as: NORVASC  Take 10 mg by mouth daily.   carvedilol  6.25 MG tablet Wait to take this until your doctor  or other care provider tells you to start again. Commonly known as: COREG  Take 1 tablet (6.25 mg total) by mouth 2 (two) times daily with a meal.   lisinopril-hydrochlorothiazide 20-25 MG tablet Wait to take this until your doctor or other care provider tells you to start again. Commonly known as: ZESTORETIC Take by mouth.   potassium chloride  10 MEQ tablet Wait to take this until your doctor or other care provider tells you to start again. Commonly known as: KLOR-CON  Take 10 mEq by mouth daily.       STOP taking these medications    ciprofloxacin  500 MG tablet Commonly known as: CIPRO        TAKE these medications    alfuzosin  10 MG 24 hr tablet Commonly known as: UROXATRAL  Take 10 mg by mouth daily after breakfast.   Allergy Relief 10 MG tablet Generic drug: loratadine  Take 10 mg by mouth daily.   amoxicillin -clavulanate 875-125 MG tablet Commonly known as: AUGMENTIN  Take 1 tablet by mouth 2 (two) times daily.   aspirin  EC 81 MG tablet Take 81 mg by mouth daily.   atorvastatin  20 MG tablet Commonly known as: LIPITOR Take 20 mg by mouth daily.   benztropine  0.5 MG tablet Commonly known as: COGENTIN  Take 0.5 mg by mouth daily.   colchicine  0.6 MG tablet Take 1 tablet (0.6 mg total) by mouth daily.   ferrous sulfate  325 (65 FE) MG tablet Take 325 mg by mouth every Monday, Wednesday, and Friday.   ketoconazole 2 % shampoo Commonly known as: NIZORAL Apply 1 Application topically 2 (two) times a week.   metFORMIN 500 MG tablet Commonly known as: GLUCOPHAGE Take 500 mg by mouth 2 (two) times daily.   risperiDONE  0.5 MG tablet Commonly known as: RISPERDAL  Take 0.5 mg by mouth daily.   risperiDONE  3 MG tablet Commonly known as: RISPERDAL  Take 3 mg by mouth at bedtime.   sertraline  50 MG tablet Commonly known as: ZOLOFT  Take 50 mg by mouth daily.        Follow-up Information     Pura Lenis, MD Follow up.   Specialty: Family Medicine Contact  information: [address] [place] [address] [phone]         Care, [facility] Follow up.   Specialty: Home Health Services Why: for home health services Contact information: [address] [place] [address] [phone]                 Discharge Instructions     Discharge instructions   Complete by: As directed    **IMPORTANT DISCHARGE INSTRUCTIONS**   From Dr. Jonel: You were admitted for trouble breathing and found to have a pleural effusion This was tapped (we call this thoracentesis, where a needle is used to draw out the fluid), and almost 3 liters of fluid were removed in two different thoracenteses.  This fluid was tested for cancer cells and none were found, so at this time, we don't see a reason to believe this is from cancer.    But it did look inflammatory, as if the fluid came from a nearby infection.  Continue antibiotics with 5 more days of Augmentin , starting tonight  Use the flutter valve as often as Iz[place] will tolerate it (a couple blows every couple hours is ideal)  You need repeat chest imaging at some point.   You have an appointment with the lung specialist Dr. Zaida in a few weeks (see below in the To Do section), he will arrange lung imaging if he feels appropriate.   You also need to keep the Cardiology follow up for the pericarditis (inflammation/fluid around the heart).   Take the colchicine  daily Call Dr. Lindia office to arrange follow up as soon as they think is appropriate based on our current notes.   Increase activity slowly   Complete by: As directed        Discharge Exam: Filed Weights   04/19/24 0607 04/20/24 0456 04/20/24 0536  Weight: 62.9 kg 61.6 kg 61.9 kg    General: Pt is alert, awake, not in acute distress, sitting up shuffling cards Cardiovascular: RRR, nl S1-S2, no murmurs appreciated.   No LE edema.   Respiratory: Normal respiratory rate and  rhythm.  Rales at right base, diminished at both bases. Abdominal: Abdomen soft and non-tender.  No distension or HSM.   Neuro/Psych: Strength symmetric in upper and lower extremities.  Judgment and insight appear at baseline.   Condition at discharge: fair  The results of significant diagnostics from this hospitalization (including imaging, microbiology, ancillary and laboratory) are listed below for reference.   Imaging Studies: DG Chest 2 View Result Date: 04/19/2024 CLINICAL DATA:  [id number] Fever [id number] EXAM: CHEST - 2 VIEW COMPARISON:  April 18, 2024 FINDINGS: Patchy opacities in the right lung base. Small left pleural effusion. The right pleural effusion is no longer visualized. Moderate cardiomegaly. Tortuous aorta with aortic atherosclerosis. No acute fracture or destructive lesions. Multilevel thoracic osteophytosis. IMPRESSION: Small left pleural effusion. Patchy opacities in both lung bases, which may reflect atelectasis or superimposed pneumonia. Electronically Signed   By: Rogelia Myers M.D.   On: 04/19/2024 16:17   DG CHEST PORT 1 VIEW Result Date: 04/18/2024 CLINICAL DATA:  Pleural effusion EXAM: PORTABLE CHEST 1 VIEW COMPARISON:  04/17/2024 FINDINGS: Right pleural effusion has diminished from prior exam. Left pleural effusion is similar. Associated bibasilar opacities, likely atelectasis. Stable cardiomegaly. Slight vascular congestion. No pneumothorax. IMPRESSION: 1. Right pleural effusion has diminished from prior exam. Left pleural effusion is similar. 2. Stable cardiomegaly. Slight vascular congestion. Electronically Signed   By: Andrea Gasman M.D.   On: 04/18/2024 22:29   DG CHEST PORT 1 VIEW Result Date: 04/17/2024 CLINICAL DATA:  Shortness of breath EXAM: PORTABLE CHEST 1 VIEW COMPARISON:  Chest x-ray 04/16/2024 FINDINGS: The heart is enlarged. There are atherosclerotic calcifications of the aorta. There is a moderate right pleural effusion and small left pleural effusion,  unchanged. No new lung infiltrate. There is no evidence for pneumothorax or acute fracture. IMPRESSION: Stable moderate right and small left pleural effusions. Electronically Signed   By: Greig Pique M.D.   On: 04/17/2024 18:55   DG Chest Port 1 View Result Date: 04/16/2024 EXAM: 1 VIEW XRAY OF THE CHEST 04/16/2024 05:39:00 AM COMPARISON: AP radiograph of chest dated 04/15/2024. CLINICAL HISTORY: Pleural effusion. Encounter for pleural effusion. FINDINGS: LUNGS AND PLEURA: Hazy and reticular opacities present within the lung bases bilaterally, which have worsened in the interim on the right. Blunting of the costophrenic angles. The hemidiaphragms are obscured. HEART AND MEDIASTINUM: The heart is enlarged. BONES AND SOFT TISSUES: No acute osseous abnormality. IMPRESSION: 1. Worsening hazy and reticular opacities within the lung  bases bilaterally, more pronounced on the right. 2. Blunting of the costophrenic angles and obscured hemidiaphragms, consistent with pleural effusion. 3. Enlarged heart. Electronically signed by: Evalene Coho MD 04/16/2024 05:53 AM EDT RP Workstation: [id number]   DG Chest Port 1 View Result Date: 04/15/2024 CLINICAL DATA:  Status post thoracentesis EXAM: PORTABLE CHEST 1 VIEW COMPARISON:  Chest x-ray 04/15/2024 FINDINGS: There is a small right pleural effusion which has slightly decreased. Small left pleural effusion is stable. No pneumothorax visualized. Patchy bibasilar infiltrates persist. The cardiomediastinal silhouette is within normal limits. No acute fractures are seen. IMPRESSION: 1. Small right pleural effusion has slightly decreased. No pneumothorax visualized. 2. Stable small left pleural effusion. 3. Stable patchy bibasilar infiltrates. Electronically Signed   By: Greig Pique M.D.   On: 04/15/2024 16:10   ECHOCARDIOGRAM LIMITED Result Date: 04/15/2024    ECHOCARDIOGRAM LIMITED REPORT   Patient Name:   Marsden Zaino Date of Exam: 04/15/2024 Medical Rec #:   [id number]     Height:       68.0 in Accession #:    [id number]    Weight:       128.7 lb Date of Birth:  [birth year]     BSA:          1.695 m Patient Age:    73 years      BP:           102/73 mmHg Patient Gender: M             HR:           110 bpm. Exam Location:  Inpatient Procedure: Limited Echo, Cardiac Doppler and Color Doppler (Both Spectral and            Color Flow Doppler were utilized during procedure). STAT ECHO Indications:    Pericardial Effusion  History:        Patient has prior history of Echocardiogram examinations, most                 recent 03/12/2024. Risk Factors:Hypertension and Diabetes.  Sonographer:    Philomena Daring Referring Phys: ZANE ADAMS IMPRESSIONS  1. Left ventricular ejection fraction, by estimation, is 60 to 65%. The left ventricle has normal function. The left ventricle has no regional wall motion abnormalities. There is moderate concentric left ventricular hypertrophy. Left ventricular diastolic parameters are consistent with Grade I diastolic dysfunction (impaired relaxation).  2. Right ventricular systolic function is normal. The right ventricular size is normal. Tricuspid regurgitation signal is inadequate for assessing PA pressure.  3. The aortic valve is tricuspid. There is mild calcification of the aortic valve. Aortic valve regurgitation is not visualized. Aortic valve sclerosis/calcification is present, without any evidence of aortic stenosis.  4. The inferior vena cava is dilated in size with >50% respiratory variability, suggesting right atrial pressure of 8 mmHg.  5. There is a left pleural effusion. Moderate circumferential pericardial effusion. There does appear to be >25% respirophasic variation of mitral inflow E velocity. There is indentation of the right atrial free wall but no evidence for RV diastolic indentation/collapse. IVC is dilated but collapses with inspiration. Overall, there appears to be possible early hemodynamic compromise from pericardial effusion  (respirophasic variation of mitral E inflow velocity) but not frank tamponade.  6. Limited echo for pericardial effusion. FINDINGS  Left Ventricle: Left ventricular ejection fraction, by estimation, is 60 to 65%. The left ventricle has normal function. The left ventricle has no regional wall motion abnormalities. The left ventricular internal cavity  size was normal in size. There is  moderate concentric left ventricular hypertrophy. Left ventricular diastolic parameters are consistent with Grade I diastolic dysfunction (impaired relaxation). Right Ventricle: The right ventricular size is normal. Right ventricular systolic function is normal. Tricuspid regurgitation signal is inadequate for assessing PA pressure. Left Atrium: Left atrial size was normal in size. Right Atrium: Right atrial size was normal in size. Pericardium: There is a left pleural effusion. Moderate circumferential pericardial effusion. There does appear to be >25% respirophasic variation of mitral inflow E velocity. There is indentation of the right atrial free wall but no evidence for RV diastolic indentation/collapse. IVC is dilated but collapses with inspiration. Overall, there appears to be possible early hemodynamic compromise from pericardial effusion (respirophasic variation of mitral E inflow velocity) but not frank tamponade. Tricuspid Valve: The tricuspid valve is normal in structure. Tricuspid valve regurgitation is trivial. Aortic Valve: The aortic valve is tricuspid. There is mild calcification of the aortic valve. Aortic valve regurgitation is not visualized. Aortic valve sclerosis/calcification is present, without any evidence of aortic stenosis. Venous: The inferior vena cava is dilated in size with greater than 50% respiratory variability, suggesting right atrial pressure of 8 mmHg. Additional Comments: Spectral Doppler performed. Color Doppler performed.  LEFT VENTRICLE PLAX 2D LVIDd:         4.10 cm   Diastology LVIDs:          2.70 cm   LV e' medial:  7.83 cm/s LV PW:         1.40 cm   LV e' lateral: 9.90 cm/s LV IVS:        1.20 cm LVOT diam:     2.20 cm LVOT Area:     3.80 cm  IVC IVC diam: 2.10 cm LEFT ATRIUM         Index LA diam:    3.10 cm 1.83 cm/m   SHUNTS Systemic Diam: 2.20 cm Dalton McleanMD Electronically signed by Ezra Kanner Signature Date/Time: 04/15/2024/3:45:38 PM    Final    CT Angio Chest PE W and/or Wo Contrast Result Date: 04/15/2024 CLINICAL DATA:  Decreased O2 sat, arrhythmia, abdominal pain. EXAM: CT ANGIOGRAPHY CHEST CT ABDOMEN AND PELVIS WITH CONTRAST TECHNIQUE: Multidetector CT imaging of the chest was performed using the standard protocol during bolus administration of intravenous contrast. Multiplanar CT image reconstructions and MIPs were obtained to evaluate the vascular anatomy. Multidetector CT imaging of the abdomen and pelvis was performed using the standard protocol during bolus administration of intravenous contrast. RADIATION DOSE REDUCTION: This exam was performed according to the departmental dose-optimization program which includes automated exposure control, adjustment of the mA and/or kV according to patient size and/or use of iterative reconstruction technique. CONTRAST:  75mL OMNIPAQUE  IOHEXOL  350 MG/ML SOLN COMPARISON:  CT abdomen pelvis 01/03/2024. FINDINGS: CTA CHEST FINDINGS Cardiovascular: Image quality is degraded by respiratory motion. Negative for pulmonary embolus. Atherosclerotic calcification of the aorta and coronary arteries. Enlarged pulmonic trunk and heart. Large pericardial effusion. Mediastinum/Nodes: Mediastinal lymph nodes measure up to 11 mm high right paratracheal station (3/21). Subcarinal lymph node measures 1.9 cm. Bihilar lymph nodes measure up to 12 mm on the left. Small axillary lymph nodes bilaterally. Air in the esophagus can be seen with dysmotility. Lungs/Pleura: Image quality is markedly degraded by respiratory motion and expiratory phase imaging. Large  loculated right pleural effusion with collapse/consolidation in the posterior segment right upper lobe, right middle lobe and right lower lobe. Moderate to large left pleural effusion with collapse/consolidation in  the left lower lobe. Airway is unremarkable. Musculoskeletal: Degenerative changes in the spine. Review of the MIP images confirms the above findings. CT ABDOMEN and PELVIS FINDINGS Hepatobiliary: Liver and gallbladder are unremarkable. No biliary ductal dilatation. Pancreas: Negative. Spleen: Negative. Adrenals/Urinary Tract: Adrenal glands are unremarkable. Low-attenuation lesions in the kidneys. No specific follow-up necessary. Ureters are decompressed. Bladder is grossly unremarkable. Stomach/Bowel: Stomach, small bowel and appendix are unremarkable. Moderate to severe stool burden. Vascular/Lymphatic: Atherosclerotic calcification of the aorta. No pathologically enlarged lymph nodes. Reproductive: Prostate is slightly prominent. Other: No free fluid.  Mesenteries and peritoneum are unremarkable. Musculoskeletal: Degenerative changes in the spine. Review of the MIP images confirms the above findings. IMPRESSION: 1. Negative for pulmonary embolus. 2. Large loculated right pleural effusion with compressive atelectasis in the right upper, right middle and right lower lobes. Moderate to large left pleural effusion with atelectasis in the left lower lobe. Difficult to exclude pneumonia. 3. Borderline mediastinal hilar adenopathy, possibly reactive in etiology. Difficult to exclude malignancy. Consider follow-up CT chest with contrast in 2-3 months, as clinically indicated. 4. Large pericardial effusion. 5. No acute findings in the abdomen or pelvis. 6. Moderate to severe stool burden. 7. Aortic atherosclerosis (ICD10-I70.0). Coronary artery calcification. 8. Enlarged pulmonic trunk, indicative of pulmonary arterial hypertension. Electronically Signed   By: Newell Eke M.D.   On: 04/15/2024 12:46   CT  ABDOMEN PELVIS W CONTRAST Result Date: 04/15/2024 CLINICAL DATA:  Decreased O2 sat, arrhythmia, abdominal pain. EXAM: CT ANGIOGRAPHY CHEST CT ABDOMEN AND PELVIS WITH CONTRAST TECHNIQUE: Multidetector CT imaging of the chest was performed using the standard protocol during bolus administration of intravenous contrast. Multiplanar CT image reconstructions and MIPs were obtained to evaluate the vascular anatomy. Multidetector CT imaging of the abdomen and pelvis was performed using the standard protocol during bolus administration of intravenous contrast. RADIATION DOSE REDUCTION: This exam was performed according to the departmental dose-optimization program which includes automated exposure control, adjustment of the mA and/or kV according to patient size and/or use of iterative reconstruction technique. CONTRAST:  75mL OMNIPAQUE  IOHEXOL  350 MG/ML SOLN COMPARISON:  CT abdomen pelvis 01/03/2024. FINDINGS: CTA CHEST FINDINGS Cardiovascular: Image quality is degraded by respiratory motion. Negative for pulmonary embolus. Atherosclerotic calcification of the aorta and coronary arteries. Enlarged pulmonic trunk and heart. Large pericardial effusion. Mediastinum/Nodes: Mediastinal lymph nodes measure up to 11 mm high right paratracheal station (3/21). Subcarinal lymph node measures 1.9 cm. Bihilar lymph nodes measure up to 12 mm on the left. Small axillary lymph nodes bilaterally. Air in the esophagus can be seen with dysmotility. Lungs/Pleura: Image quality is markedly degraded by respiratory motion and expiratory phase imaging. Large loculated right pleural effusion with collapse/consolidation in the posterior segment right upper lobe, right middle lobe and right lower lobe. Moderate to large left pleural effusion with collapse/consolidation in the left lower lobe. Airway is unremarkable. Musculoskeletal: Degenerative changes in the spine. Review of the MIP images confirms the above findings. CT ABDOMEN and PELVIS  FINDINGS Hepatobiliary: Liver and gallbladder are unremarkable. No biliary ductal dilatation. Pancreas: Negative. Spleen: Negative. Adrenals/Urinary Tract: Adrenal glands are unremarkable. Low-attenuation lesions in the kidneys. No specific follow-up necessary. Ureters are decompressed. Bladder is grossly unremarkable. Stomach/Bowel: Stomach, small bowel and appendix are unremarkable. Moderate to severe stool burden. Vascular/Lymphatic: Atherosclerotic calcification of the aorta. No pathologically enlarged lymph nodes. Reproductive: Prostate is slightly prominent. Other: No free fluid.  Mesenteries and peritoneum are unremarkable. Musculoskeletal: Degenerative changes in the spine. Review of the MIP images confirms the above  findings. IMPRESSION: 1. Negative for pulmonary embolus. 2. Large loculated right pleural effusion with compressive atelectasis in the right upper, right middle and right lower lobes. Moderate to large left pleural effusion with atelectasis in the left lower lobe. Difficult to exclude pneumonia. 3. Borderline mediastinal hilar adenopathy, possibly reactive in etiology. Difficult to exclude malignancy. Consider follow-up CT chest with contrast in 2-3 months, as clinically indicated. 4. Large pericardial effusion. 5. No acute findings in the abdomen or pelvis. 6. Moderate to severe stool burden. 7. Aortic atherosclerosis (ICD10-I70.0). Coronary artery calcification. 8. Enlarged pulmonic trunk, indicative of pulmonary arterial hypertension. Electronically Signed   By: Newell Eke M.D.   On: 04/15/2024 12:46   DG Chest Port 1 View Result Date: 04/15/2024 CLINICAL DATA:  Dyspnea EXAM: PORTABLE CHEST - 1 VIEW COMPARISON:  March 07, 2024 FINDINGS: Low lung volumes. Diffuse bilateral perihilar interstitial opacities. Small to moderate volume right and small left pleural effusions. No pneumothorax. Moderate cardiomegaly. Aortic atherosclerosis. No acute fracture or destructive lesions. Multilevel  thoracic osteophytosis. IMPRESSION: Cardiomegaly with findings of interstitial edema. Bilateral pleural effusions, small to moderate on the right and small on the left. Electronically Signed   By: Rogelia Myers M.D.   On: 04/15/2024 11:28    Microbiology: Results for orders placed or performed during the hospital encounter of 04/15/24  Resp panel by RT-PCR (RSV, Flu A&B, Covid) Anterior Nasal Swab     Status: None   Collection Time: 04/15/24  9:55 AM   Specimen: Anterior Nasal Swab  Result Value Ref Range Status   SARS Coronavirus 2 by RT PCR NEGATIVE NEGATIVE Final   Influenza A by PCR NEGATIVE NEGATIVE Final   Influenza B by PCR NEGATIVE NEGATIVE Final    Comment: (NOTE) The Xpert Xpress SARS-CoV-2/FLU/RSV plus assay is intended as an aid in the diagnosis of influenza from Nasopharyngeal swab specimens and should not be used as a sole basis for treatment. Nasal washings and aspirates are unacceptable for Xpert Xpress SARS-CoV-2/FLU/RSV testing.  Fact Sheet for Patients: BloggerCourse.com  Fact Sheet for Healthcare Providers: SeriousBroker.it  This test is not yet approved or cleared by the [place]  FDA and has been authorized for detection and/or diagnosis of SARS-CoV-2 by FDA under an Emergency Use Authorization (EUA). This EUA will remain in effect (meaning this test can be used) for the duration of the COVID-19 declaration under Section 564(b)(1) of the Act, 21 U.S.C. section 360bbb-3(b)(1), unless the authorization is terminated or revoked.     Resp Syncytial Virus by PCR NEGATIVE NEGATIVE Final    Comment: (NOTE) Fact Sheet for Patients: BloggerCourse.com  Fact Sheet for Healthcare Providers: SeriousBroker.it  This test is not yet approved or cleared by the [place]  FDA and has been authorized for detection and/or diagnosis of SARS-CoV-2 by FDA under an  Emergency Use Authorization (EUA). This EUA will remain in effect (meaning this test can be used) for the duration of the COVID-19 declaration under Section 564(b)(1) of the Act, 21 U.S.C. section 360bbb-3(b)(1), unless the authorization is terminated or revoked.  Performed at [facility], [address] [place], [place] [address]   Blood Culture (routine x 2)     Status: None   Collection Time: 04/15/24 10:00 AM   Specimen: BLOOD  Result Value Ref Range Status   Specimen Description BLOOD SITE NOT SPECIFIED  Final   Special Requests   Final    BOTTLES DRAWN AEROBIC ONLY Blood Culture results may not be optimal due to an inadequate  volume of blood received in culture bottles   Culture   Final    NO GROWTH 5 DAYS Performed at [facility], [address] [place], [place] [address]    Report Status 04/20/2024 FINAL  Final  Blood Culture (routine x 2)     Status: None   Collection Time: 04/15/24  1:35 PM   Specimen: BLOOD LEFT WRIST  Result Value Ref Range Status   Specimen Description BLOOD LEFT WRIST  Final   Special Requests   Final    BOTTLES DRAWN AEROBIC AND ANAEROBIC Blood Culture results may not be optimal due to an inadequate volume of blood received in culture bottles   Culture   Final    NO GROWTH 5 DAYS Performed at [facility], [address], [place], [place] [address]    Report Status 04/20/2024 FINAL  Final  Culture, body fluid w Gram Stain-bottle     Status: None   Collection Time: 04/15/24  3:15 PM   Specimen: Pleura  Result Value Ref Range Status   Specimen Description PLEURAL  Final   Special Requests NONE  Final   Culture   Final    NO GROWTH 5 DAYS Performed at [facility], [address] [place], [place] [address]    Report Status 04/20/2024 FINAL  Final  Respiratory (~20 pathogens) panel by PCR     Status: None   Collection Time: 04/15/24  3:42 PM   Specimen: Nasal Mucosa; Respiratory  Result Value Ref Range Status    Adenovirus NOT DETECTED NOT DETECTED Final   Coronavirus 229E NOT DETECTED NOT DETECTED Final    Comment: (NOTE) The Coronavirus on the Respiratory Panel, DOES NOT test for the novel  Coronavirus (2019 nCoV)    Coronavirus HKU1 NOT DETECTED NOT DETECTED Final   Coronavirus NL63 NOT DETECTED NOT DETECTED Final   Coronavirus OC43 NOT DETECTED NOT DETECTED Final   Metapneumovirus NOT DETECTED NOT DETECTED Final   Rhinovirus / Enterovirus NOT DETECTED NOT DETECTED Final   Influenza A NOT DETECTED NOT DETECTED Final   Influenza B NOT DETECTED NOT DETECTED Final   Parainfluenza Virus 1 NOT DETECTED NOT DETECTED Final   Parainfluenza Virus 2 NOT DETECTED NOT DETECTED Final   Parainfluenza Virus 3 NOT DETECTED NOT DETECTED Final   Parainfluenza Virus 4 NOT DETECTED NOT DETECTED Final   Respiratory Syncytial Virus NOT DETECTED NOT DETECTED Final   Bordetella pertussis NOT DETECTED NOT DETECTED Final   Bordetella Parapertussis NOT DETECTED NOT DETECTED Final   Chlamydophila pneumoniae NOT DETECTED NOT DETECTED Final   Mycoplasma pneumoniae NOT DETECTED NOT DETECTED Final    Comment: Performed at [facility], [address], [place], [place] [address]  MRSA Next Gen by PCR, Nasal     Status: None   Collection Time: 04/15/24  3:52 PM   Specimen: Nasal Mucosa; Nasal Swab  Result Value Ref Range Status   MRSA by PCR Next Gen NOT DETECTED NOT DETECTED Final    Comment: (NOTE) The GeneXpert MRSA Assay (FDA approved for NASAL specimens only), is one component of a comprehensive MRSA colonization surveillance program. It is not intended to diagnose MRSA infection nor to guide or monitor treatment for MRSA infections. Test performance is not FDA approved in patients less than 19 years old. Performed at [facility], [address], [place], [place] [address]   Body fluid culture w Gram Stain     Status: None (Preliminary result)  Collection Time: 04/18/24  6:14 PM   Specimen:  Pleural Fluid  Result Value Ref Range Status   Specimen Description PLEURAL  Final   Special Requests RIGHT  Final   Gram Stain NO WBC SEEN NO ORGANISMS SEEN   Final   Culture   Final    NO GROWTH 2 DAYS Performed at [facility], [address], [place], [place] [address]    Report Status PENDING  Incomplete    Labs: CBC: Recent Labs  Lab 04/15/24 1000 04/15/24 1013 04/16/24 0257 04/18/24 0246 04/20/24 0239  WBC 7.7  --  7.9 6.9 6.7  NEUTROABS 6.7  --   --   --   --   HGB 10.1* 10.5*  10.5* 9.2* 9.5* 9.6*  HCT 32.3* 31.0*  31.0* 28.1* 30.0* 30.8*  MCV 91.2  --  87.5 88.8 90.1  PLT 228  --  251 297 313   Basic Metabolic Panel: Recent Labs  Lab 04/15/24 1000 04/15/24 1013 04/16/24 0257 04/17/24 1054 04/18/24 0246 04/20/24 0239  NA 134* 136  135 139 141 141 140  K 3.8 3.8  3.8 3.7 4.0 3.7 3.8  CL 99 101 105 107 107 110  CO2 21*  --  23 25 24 26   GLUCOSE 183* 184* 84 165* 117* 134*  BUN 16 17 19 15 15 14   CREATININE 1.21 1.10 1.46* 1.17 1.26* 1.18  CALCIUM  8.3*  --  8.2* 8.6* 8.4* 8.1*   Liver Function Tests: Recent Labs  Lab 04/15/24 1000  AST 37  ALT 28  ALKPHOS 66  BILITOT 0.6  PROT 6.5  ALBUMIN 2.6*   CBG: Recent Labs  Lab 04/19/24 1137 04/19/24 1514 04/19/24 2259 04/20/24 0627 04/20/24 1054  GLUCAP 119* 164* 234* 114* 166*    Discharge time spent: approximately 45 minutes spent on discharge counseling, evaluation of patient on day of discharge, and coordination of discharge planning with nursing, social work, pharmacy and case management  Signed: Lonni SHAUNNA Dalton, MD [facility] 04/20/2024

## 2024-04-20 NOTE — TOC Transition Note (Addendum)
 Transition of Care [facility]) - Discharge Note   Patient Details  Name: Fred [name] MRN: [id number] Date of Birth: March 15, 1951  [facility] of [facility] CM/SW Contact:  Isaiah Public, LCSWA Phone Number: 04/20/2024, 1:00 PM   Clinical Narrative:     CSW spoke with Mr. MICAEL Molt with group home who informed CSW no FL2 needed for patient to return.  Patient will DC to: [facility]  Anticipated DC date: 04/20/2024  Family notified:  [name]  Transport by: MICAEL Molt with Group Home  ?  Per MD patient ready for DC to Group Home . RN, patient, patient's family, and facility notified of DC. RN given number for report [phone].  CSW signing off.   Final next level of care: Group Home Barriers to Discharge: No Barriers Identified   Patient Goals and CMS Choice            Discharge Placement              Patient chooses bed at: Other - please specify in the comment section below: (Group Home) Patient to be transferred to facility by: [name] Molt - caregiver Name of family member notified: [name]  (Sister) Patient and family notified of of transfer: 04/19/24  Discharge Plan and Services Additional resources added to the After Visit Summary for                            [facility] Arranged: RN [facility] Agency: [facility] Date [facility] Agency Contacted: 04/20/24 Time HH Agency Contacted: 1036 Representative spoke with at [facility] Agency: Darleene  Social Drivers of Health (SDOH) Interventions SDOH Screenings   Food Insecurity: No Food Insecurity (04/16/2024)  Housing: Low Risk  (04/16/2024)  Transportation Needs: No Transportation Needs (04/16/2024)  Utilities: Not At Risk (04/16/2024)  Financial Resource Strain: Low Risk  (03/20/2024)   Received from [facility]  Physical Activity: Unknown (04/26/2023)   Received from [facility]  Social Connections: Unknown (04/16/2024)  Stress: No Stress Concern Present (04/26/2023)   Received from [facility]  Tobacco Use:  Medium Risk (03/20/2024)   Received from [facility]     Readmission Risk Interventions     No data to display

## 2024-04-21 LAB — CHOLESTEROL, BODY FLUID: Cholesterol, Fluid: 58 mg/dL

## 2024-04-22 LAB — BODY FLUID CULTURE W GRAM STAIN
Culture: NO GROWTH
Gram Stain: NONE SEEN

## 2024-04-23 LAB — MISC LABCORP TEST (SEND OUT): Labcorp test code: 9985

## 2024-04-24 LAB — CYTOLOGY - NON PAP

## 2024-05-08 ENCOUNTER — Encounter: Payer: Self-pay | Admitting: Internal Medicine

## 2024-05-08 ENCOUNTER — Ambulatory Visit: Admitting: Internal Medicine

## 2024-05-08 VITALS — BP 128/66 | HR 83 | Temp 98.2°F | Ht 67.0 in | Wt 127.0 lb

## 2024-05-08 DIAGNOSIS — I3139 Other pericardial effusion (noninflammatory): Secondary | ICD-10-CM

## 2024-05-08 DIAGNOSIS — J90 Pleural effusion, not elsewhere classified: Secondary | ICD-10-CM

## 2024-05-08 DIAGNOSIS — Z87891 Personal history of nicotine dependence: Secondary | ICD-10-CM

## 2024-05-08 DIAGNOSIS — R634 Abnormal weight loss: Secondary | ICD-10-CM

## 2024-05-08 DIAGNOSIS — E119 Type 2 diabetes mellitus without complications: Secondary | ICD-10-CM | POA: Diagnosis not present

## 2024-05-08 NOTE — Patient Instructions (Addendum)
 It was a pleasure to see you today!  Please schedule follow up with myself in 2 months.  If my schedule is not open yet, we will contact you with a reminder closer to that time. Please call [phone] if you haven't heard from us  a month before, and always call us  sooner if issues or concerns arise. You can also send us  a message through MyChart, but but aware that this is not to be used for urgent issues and it may take up to 5-7 days to receive a reply. Please be aware that you will likely be able to view your results before I have a chance to respond to them. Please give us  5 business days to respond to any non-urgent results.    YOUR PLAN:  -RECURRENT BILATERAL PLEURAL EFFUSIONS: Recurrent pleural effusions mean that fluid keeps building up around your lungs. We will perform a procedure called bilateral thoracentesis to drain the fluid and analyze it. Depending on the results, we may consider using diuretics or adjusting your current medication, colchicine . We will also coordinate with your cardiologist for further management.  -PERICARDIAL EFFUSION: A pericardial effusion is a buildup of fluid around your heart. Although it is small and not causing significant heart problems, we will continue to monitor it with your cardiologist and coordinate any necessary treatments.  -UNINTENTIONAL WEIGHT LOSS: You have been losing weight without trying, which can be concerning. To help you gain weight in a healthy way, we recommend using Glucerna, a nutritional supplement suitable for people with diabetes. We also encourage you to eat high-protein, nutrient-dense foods.

## 2024-05-08 NOTE — Progress Notes (Signed)
 Fred [name]    [id number]    Feb 10, 1951  Primary Care Physician:Bouska, Alm, MD Date of Appointment: 05/08/2024 Established Patient Visit  Chief complaint:   Chief Complaint  Patient presents with   Hospitalization Follow-up    Follow up for fluid on lungs.     HPI: Fred [name] is a 73 year old gentleman with a history of cognitive impairment who lives in a group home.  He was hospitalized in August 2025 for weakness, falls, possible pneumonia.  He was found to have pericardial effusion and underwent drain placement.  He had bilateral pleural effusions which were drained.  Cytology from all 3 of these was negative for malignancy.  He was treated [place] for pneumonia.  He was started on colchicine  for the pericardial effusion.  Interval Updates: Discussed the use of AI scribe software for clinical note transcription with the patient, who gave verbal consent to proceed.  History of Present Illness Here for follow-up and evaluation for the pleural effusions.  The fuid continues to recur, and he has experienced weight loss since his hospital visit. His appetite remains normal, but he has not been gaining weight. His caretaker reports that he eats well but has difficulty with certain foods due to dental issues.  His breathing has improved since the initial episode, and his oxygen saturation levels have been stable in the 90s. He is able to engage in physical activities such as walking around his home, although he has reduced his participation in a day program due to decreased interest rather than physical limitation.  He lives in his family home in [place] with his brothers and receives assistance from aides. He has a history of cognitive impairment and diabetes, which complicates his nutritional management. His caretaker monitors his blood pressure and oxygen levels regularly, and he was seen by an in-home nurse prior to this visit.  He saw his cardiologist at [facility]  last week.  Repeat echocardiogram showed trivial pericardial effusion which would not be amenable to any treatment.  It was recommended that he continue his colchicine .  That echocardiogram did show moderate bilateral pleural effusions.    I have reviewed the patient's family social and past medical history and updated as appropriate.   Past Medical History:  Diagnosis Date   Acute UTI (urinary tract infection) 01/01/2023   AKI (acute kidney injury) (HCC) 11/02/2022   Diabetes mellitus    Hypertension    Mental retardation    Severe sepsis (HCC) 11/02/2022    Past Surgical History:  Procedure Laterality Date   PERICARDIOCENTESIS N/A 03/09/2024   Procedure: PERICARDIOCENTESIS;  Surgeon: Swazil[place], Peter M, MD;  Location: [facility] INVASIVE CV LAB;  Service: Cardiovascular;  Laterality: N/A;    Family History  Family history unknown: Yes    Social History   Occupational History   Not on file  Tobacco Use   Smoking status: Former    Current packs/day: 0.00    Types: Cigarettes    Quit date: 06/24/2016    Years since quitting: 7.8   Smokeless tobacco: Never  Substance and Sexual Activity   Alcohol use: No   Drug use: No   Sexual activity: Not on file     Physical Exam: Blood pressure 128/66, pulse 83, temperature 98.2 F (36.8 C), temperature source Oral, height 5' 7 (1.702 m), weight 127 lb (57.6 kg), SpO2 99%.  Gen:      No acute distress, thin, frail ENT:  no nasal polyps, mucus membranes moist  Lungs:    No increased respiratory effort, symmetric chest wall excursion, clear to auscultation bilaterally, no wheezes or crackles CV:         Regular rate and rhythm; no murmurs, rubs, or gallops.  No pedal edema   Data Reviewed: Imaging: I have personally reviewed the chest xray August 2025 - small left pleural effusion, patch bilateral airspace disease.   Right pleural effusion 05/08/2024    Left pleural effusion 05/08/24   PFTs:  Labs: Cytology left pleural fluid  04/20/24 negative for malignant cells Cytology right pleural fluid 04/15/24 negative for malignant cells Cytology pericardial fluid 03/09/24 shows resactive mesothelial cells Immunization status: Immunization History  Administered Date(s) Administered   Pfizer Covid-19 Vaccine Bivalent Booster 71yrs & up 07/28/2021   Pneumococcal Conjugate-13 08/04/2017   Pneumococcal Polysaccharide-23 08/31/2018    External Records Personally Reviewed: hospital stay  Assessment and Plan Assessment & Plan Recurrent bilateral pleural effusions Recurrent pleural effusions with negative fluid analysis for infection and cancer. Differential includes inflammation and cardiac dysfunction. Cardiologist noted small pericardial effusion without significant cardiac dysfunction. - Order bilateral thoracentesis for fluid drainage and analysis. - Send fluid for culture and analysis. - Consider diuretics if fluid is transudative - Consider increasing colchicine  or newer medications if fluid is inflammatory. - Coordinate with cardiologist for further management.  Pericardial effusion Small circumferential pericardial effusion noted on echocardiogram. No significant cardiac dysfunction. Related to pleural effusions, possibly due to inflammation or cardiac dysfunction. - Monitor pericardial effusion with cardiologist.  Unintentional weight loss Unintentional weight loss post-hospitalization with normal appetite but no weight regain. Difficulty with high-calorie supplements due to diabetes and gastrointestinal side effects. Dental issues complicate dietary intake. - Recommend Glucerna for diabetic-friendly nutritional supplementation. - Encourage high-protein, nutrient-dense foods.  Type 2 diabetes mellitus Type 2 diabetes mellitus complicates nutritional management. - Adjust diet to manage diabetes.  I spent 30 minutes on 05/08/2024 in care of this patient including face to face time and non-face to face time spent  charting, review of outside records, and coordination of care.    Return to Care: Return in about 2 months (around 07/08/2024).   Verdon Gore, MD Pulmonary and Critical Care Medicine [facility] Office:[phone]

## 2024-05-16 ENCOUNTER — Inpatient Hospital Stay

## 2024-06-20 ENCOUNTER — Ambulatory Visit (HOSPITAL_COMMUNITY)
Admission: RE | Admit: 2024-06-20 | Discharge: 2024-06-20 | Disposition: A | Source: Ambulatory Visit | Attending: Radiology

## 2024-06-20 ENCOUNTER — Ambulatory Visit (HOSPITAL_COMMUNITY)
Admission: RE | Admit: 2024-06-20 | Discharge: 2024-06-20 | Disposition: A | Discharge status: Home / Self Care | Source: Ambulatory Visit | Attending: Internal Medicine | Admitting: Internal Medicine

## 2024-06-20 ENCOUNTER — Other Ambulatory Visit (HOSPITAL_COMMUNITY): Payer: Self-pay | Admitting: Radiology

## 2024-06-20 DIAGNOSIS — J90 Pleural effusion, not elsewhere classified: Secondary | ICD-10-CM | POA: Insufficient documentation

## 2024-06-20 DIAGNOSIS — Z9889 Other specified postprocedural states: Secondary | ICD-10-CM

## 2024-06-20 LAB — GLUCOSE, PLEURAL OR PERITONEAL FLUID: Glucose, Fluid: 97 mg/dL

## 2024-06-20 LAB — PROTEIN, PLEURAL OR PERITONEAL FLUID: Total protein, fluid: 4 g/dL

## 2024-06-20 LAB — LACTATE DEHYDROGENASE, PLEURAL OR PERITONEAL FLUID: LD, Fluid: 74 U/L — ABNORMAL HIGH (ref 3–23)

## 2024-06-20 NOTE — Procedures (Signed)
 PROCEDURE SUMMARY:  Successful image-guided left thoracentesis. Yielded 250 mL of hazy yellow fluid. Patient tolerated procedure well. EBL: trace No immediate complications.  Specimen was sent for labs. Post procedure CXR shows no pneumothorax.  Please see imaging section of Epic for full dictation.  Kimble DEL Tereza Gilham PA-C 06/20/2024 2:08 PM

## 2024-06-21 LAB — CYTOLOGY - NON PAP

## 2024-06-23 LAB — BODY FLUID CULTURE W GRAM STAIN: Culture: NO GROWTH

## 2024-06-23 LAB — CHOLESTEROL, BODY FLUID: Cholesterol, Fluid: 44 mg/dL

## 2024-06-28 ENCOUNTER — Inpatient Hospital Stay (HOSPITAL_COMMUNITY)
Admission: EM | Admit: 2024-06-28 | Discharge: 2024-07-04 | DRG: 070 | Disposition: A | Discharge status: Home / Self Care | Attending: Internal Medicine | Admitting: Internal Medicine

## 2024-06-28 ENCOUNTER — Emergency Department (HOSPITAL_COMMUNITY)

## 2024-06-28 ENCOUNTER — Other Ambulatory Visit: Payer: Self-pay

## 2024-06-28 ENCOUNTER — Encounter (HOSPITAL_COMMUNITY): Payer: Self-pay | Admitting: Emergency Medicine

## 2024-06-28 DIAGNOSIS — Z79899 Other long term (current) drug therapy: Secondary | ICD-10-CM

## 2024-06-28 DIAGNOSIS — B952 Enterococcus as the cause of diseases classified elsewhere: Secondary | ICD-10-CM | POA: Diagnosis present

## 2024-06-28 DIAGNOSIS — G9341 Metabolic encephalopathy: Principal | ICD-10-CM | POA: Diagnosis present

## 2024-06-28 DIAGNOSIS — I5032 Chronic diastolic (congestive) heart failure: Secondary | ICD-10-CM | POA: Diagnosis present

## 2024-06-28 DIAGNOSIS — M109 Gout, unspecified: Secondary | ICD-10-CM | POA: Diagnosis present

## 2024-06-28 DIAGNOSIS — E119 Type 2 diabetes mellitus without complications: Secondary | ICD-10-CM

## 2024-06-28 DIAGNOSIS — J690 Pneumonitis due to inhalation of food and vomit: Secondary | ICD-10-CM | POA: Diagnosis present

## 2024-06-28 DIAGNOSIS — I480 Paroxysmal atrial fibrillation: Secondary | ICD-10-CM | POA: Diagnosis present

## 2024-06-28 DIAGNOSIS — F79 Unspecified intellectual disabilities: Secondary | ICD-10-CM | POA: Diagnosis present

## 2024-06-28 DIAGNOSIS — Z7982 Long term (current) use of aspirin: Secondary | ICD-10-CM

## 2024-06-28 DIAGNOSIS — Z7984 Long term (current) use of oral hypoglycemic drugs: Secondary | ICD-10-CM

## 2024-06-28 DIAGNOSIS — N390 Urinary tract infection, site not specified: Principal | ICD-10-CM | POA: Diagnosis present

## 2024-06-28 DIAGNOSIS — B999 Unspecified infectious disease: Secondary | ICD-10-CM | POA: Diagnosis present

## 2024-06-28 DIAGNOSIS — Z87891 Personal history of nicotine dependence: Secondary | ICD-10-CM

## 2024-06-28 DIAGNOSIS — Z87440 Personal history of urinary (tract) infections: Secondary | ICD-10-CM

## 2024-06-28 DIAGNOSIS — E785 Hyperlipidemia, unspecified: Secondary | ICD-10-CM | POA: Diagnosis present

## 2024-06-28 DIAGNOSIS — J189 Pneumonia, unspecified organism: Secondary | ICD-10-CM

## 2024-06-28 DIAGNOSIS — N400 Enlarged prostate without lower urinary tract symptoms: Secondary | ICD-10-CM | POA: Diagnosis present

## 2024-06-28 DIAGNOSIS — J159 Unspecified bacterial pneumonia: Secondary | ICD-10-CM | POA: Diagnosis present

## 2024-06-28 DIAGNOSIS — Z66 Do not resuscitate: Secondary | ICD-10-CM | POA: Diagnosis present

## 2024-06-28 DIAGNOSIS — I110 Hypertensive heart disease with heart failure: Secondary | ICD-10-CM | POA: Diagnosis present

## 2024-06-28 DIAGNOSIS — N179 Acute kidney failure, unspecified: Secondary | ICD-10-CM | POA: Diagnosis not present

## 2024-06-28 DIAGNOSIS — I2510 Atherosclerotic heart disease of native coronary artery without angina pectoris: Secondary | ICD-10-CM | POA: Diagnosis present

## 2024-06-28 DIAGNOSIS — G9349 Other encephalopathy: Principal | ICD-10-CM | POA: Diagnosis present

## 2024-06-28 DIAGNOSIS — F39 Unspecified mood [affective] disorder: Secondary | ICD-10-CM | POA: Diagnosis present

## 2024-06-28 LAB — CBC WITH DIFFERENTIAL/PLATELET
Abs Immature Granulocytes: 0.01 K/uL (ref 0.00–0.07)
Basophils Absolute: 0 K/uL (ref 0.0–0.1)
Basophils Relative: 0 %
Eosinophils Absolute: 0.1 K/uL (ref 0.0–0.5)
Eosinophils Relative: 1 %
HCT: 35.9 % — ABNORMAL LOW (ref 39.0–52.0)
Hemoglobin: 11.1 g/dL — ABNORMAL LOW (ref 13.0–17.0)
Immature Granulocytes: 0 %
Lymphocytes Relative: 18 %
Lymphs Abs: 0.9 K/uL (ref 0.7–4.0)
MCH: 27.8 pg (ref 26.0–34.0)
MCHC: 30.9 g/dL (ref 30.0–36.0)
MCV: 90 fL (ref 80.0–100.0)
Monocytes Absolute: 0.5 K/uL (ref 0.1–1.0)
Monocytes Relative: 9 %
Neutro Abs: 3.6 K/uL (ref 1.7–7.7)
Neutrophils Relative %: 72 %
Platelets: 142 K/uL — ABNORMAL LOW (ref 150–400)
RBC: 3.99 MIL/uL — ABNORMAL LOW (ref 4.22–5.81)
RDW: 16.5 % — ABNORMAL HIGH (ref 11.5–15.5)
WBC: 5 K/uL (ref 4.0–10.5)
nRBC: 0 % (ref 0.0–0.2)

## 2024-06-28 LAB — COMPREHENSIVE METABOLIC PANEL WITH GFR
ALT: 14 U/L (ref 0–44)
AST: 17 U/L (ref 15–41)
Albumin: 2.7 g/dL — ABNORMAL LOW (ref 3.5–5.0)
Alkaline Phosphatase: 54 U/L (ref 38–126)
Anion gap: 11 (ref 5–15)
BUN: 22 mg/dL (ref 8–23)
CO2: 24 mmol/L (ref 22–32)
Calcium: 8.1 mg/dL — ABNORMAL LOW (ref 8.9–10.3)
Chloride: 108 mmol/L (ref 98–111)
Creatinine, Ser: 1.15 mg/dL (ref 0.61–1.24)
GFR, Estimated: >60 mL/min (ref >60)
Glucose, Bld: 85 mg/dL (ref 70–99)
Potassium: 3.2 mmol/L — ABNORMAL LOW (ref 3.5–5.1)
Sodium: 143 mmol/L (ref 135–145)
Total Bilirubin: 0.5 mg/dL (ref 0.0–1.2)
Total Protein: 5.9 g/dL — ABNORMAL LOW (ref 6.5–8.1)

## 2024-06-28 LAB — LIPASE, BLOOD: Lipase: 21 U/L (ref 11–51)

## 2024-06-28 MED ORDER — AMLODIPINE BESYLATE 5 MG PO TABS
10.0000 mg | ORAL_TABLET | Freq: Once | ORAL | Status: AC
  Administered 2024-06-28: 10 mg via ORAL
  Filled 2024-06-28: qty 2

## 2024-06-28 MED ORDER — CARVEDILOL 3.125 MG PO TABS
6.2500 mg | ORAL_TABLET | ORAL | Status: AC
  Administered 2024-06-28: 6.25 mg via ORAL
  Filled 2024-06-28: qty 2

## 2024-06-28 MED ORDER — IOHEXOL 350 MG/ML SOLN
75.0000 mL | Freq: Once | INTRAVENOUS | Status: AC | PRN
  Administered 2024-06-28: 75 mL via INTRAVENOUS

## 2024-06-28 NOTE — ED Provider Triage Note (Signed)
 Emergency Medicine Provider Triage Evaluation Note  Akil Hoos , a 73 y.o. male  was evaluated in triage.  Pt is poor historian but arrived by EMS from group home.  EMS reported that caretaker advised patient was not acting his normal and was more lethargic than normal.  Review of Systems  Positive: Lethargy Negative: Chest pain, respiratory distress  Physical Exam  BP (!) 200/95 (BP Location: Left Arm)   Pulse 67   Temp 98.1 F (36.7 C)   Resp 14   Ht 5' 7 (1.702 m)   Wt 58 kg   SpO2 100%   BMI 20.03 kg/m  Gen:   Awake, no distress   Resp:  Normal effort  MSK:   Moves other extremities without difficulty.  No noted swelling in lower extremities. Other:    Medical Decision Making  Medically screening exam initiated at 9:21 PM.  Appropriate orders placed.  Maliki Tuohey was informed that the remainder of the evaluation will be completed by another provider, this initial triage assessment does not replace that evaluation, and the importance of remaining in the ED until their evaluation is complete.  Patient brought in by EMS from group home.  Caretaker advised patient appears lethargic and a concern for UTI.  Vitals are unremarkable and patient on exam reports his abdomen hurts.  No noticeable pain to palpation.  Lungs are clear to auscultation all fields.   Myriam Fonda RAMAN, [place] 06/28/24 2123

## 2024-06-28 NOTE — ED Provider Notes (Incomplete)
 [place] EMERGENCY DEPARTMENT AT [facility] Provider Note   CSN: [id number] Arrival date & time: 06/28/24  2100     Patient presents with: Fatigue   Fred [name] is a 73 y.o. male with history of acute UTI, AKI, diabetes, hypertension, mental retardation.  Presents to ED with caregiver for evaluation of fatigue and altered mental status.  Per caregiver the patient reportedly is less aware, less responsive than he usually is.  Caregiver reports that the patient will typically be much more active and aware of what is going on than he is currently.  He reports that for about 2 hours he was seemingly more altered, confused and seemed lethargic.  He reports that upon arrival to the ED, after EMS was called, the patient does seem to be back to his baseline.  The patient caregiver reports that he has been urinating on himself more often in the last 2 days and this is typically indicative of a UTI. The patient on my exam is at his baseline per caregiver shuffling cards in bed in no apparent distress. He follows commands appropriately.   HPI     Prior to Admission medications   Medication Sig Start Date End Date Taking? Authorizing Provider  alfuzosin  (UROXATRAL ) 10 MG 24 hr tablet Take 10 mg by mouth daily after breakfast.  03/30/20   [provider]  ALLERGY RELIEF 10 MG tablet Take 10 mg by mouth daily.    [provider]  amLODipine  (NORVASC ) 10 MG tablet Take 10 mg by mouth daily.  05/22/19   [provider]  amoxicillin -clavulanate (AUGMENTIN ) 875-125 MG tablet Take 1 tablet by mouth 2 (two) times daily. 04/20/24   DanfordLonni SQUIBB, MD  aspirin  EC 81 MG tablet Take 81 mg by mouth daily.    [provider]  atorvastatin  (LIPITOR) 20 MG tablet Take 20 mg by mouth daily.  02/28/18   [provider]  benztropine  (COGENTIN ) 0.5 MG tablet Take 0.5 mg by mouth daily.     [provider]  carvedilol  (COREG ) 6.25 MG tablet Take 1  tablet (6.25 mg total) by mouth 2 (two) times daily with a meal. 01/03/23   Jillian Buttery, MD  colchicine  0.6 MG tablet Take 1 tablet (0.6 mg total) by mouth daily. 03/14/24   Smucker, Melvenia, MD  ferrous sulfate  325 (65 FE) MG tablet Take 325 mg by mouth every Monday, Wednesday, and Friday.  03/27/18   [provider]  ketoconazole (NIZORAL) 2 % shampoo Apply 1 Application topically 2 (two) times a week.    [provider]  lisinopril-hydrochlorothiazide (ZESTORETIC) 20-25 MG tablet Take by mouth.    [provider]  metFORMIN (GLUCOPHAGE) 500 MG tablet Take 500 mg by mouth 2 (two) times daily.  01/20/15   [provider]  potassium chloride  (KLOR-CON ) 10 MEQ tablet Take 10 mEq by mouth daily.    [provider]  risperiDONE  (RISPERDAL ) 0.5 MG tablet Take 0.5 mg by mouth daily.    [provider]  risperiDONE  (RISPERDAL ) 3 MG tablet Take 3 mg by mouth at bedtime. 05/16/19   [provider]  sertraline  (ZOLOFT ) 50 MG tablet Take 50 mg by mouth daily.    [provider]    Allergies: Patient has no known allergies.    Review of Systems  Updated Vital Signs BP (!) 200/95 (BP Location: Left Arm)   Pulse 67   Temp 98.1 F (36.7 C)   Resp 14   Ht 5'  7 (1.702 m)   Wt 58 kg   SpO2 100%   BMI 20.03 kg/m   Physical Exam  (all labs ordered are listed, but only abnormal results are displayed) Labs Reviewed  COMPREHENSIVE METABOLIC PANEL WITH GFR - Abnormal; Notable for the following components:      Result Value   Potassium 3.2 (*)    Calcium  8.1 (*)    Total Protein 5.9 (*)    Albumin 2.7 (*)    All other components within normal limits  CBC WITH DIFFERENTIAL/PLATELET - Abnormal; Notable for the following components:   RBC 3.99 (*)    Hemoglobin 11.1 (*)    HCT 35.9 (*)    RDW 16.5 (*)    Platelets 142 (*)    All other components within normal limits  LIPASE, BLOOD  URINALYSIS, ROUTINE W REFLEX MICROSCOPIC     EKG: None  Radiology: No results found.  {Document cardiac monitor, telemetry assessment procedure when appropriate:32947} Procedures   Medications Ordered in the ED  amLODipine  (NORVASC ) tablet 10 mg (has no administration in time range)  carvedilol  (COREG ) tablet 6.25 mg (has no administration in time range)      {Click here for ABCD2, HEART and other calculators REFRESH Note before signing:1}                              Medical Decision Making Amount and/or Complexity of Data Reviewed Radiology: ordered.  Risk Prescription drug management.   ***  {Document critical care time when appropriate  Document review of labs and clinical decision tools ie CHADS2VASC2, etc  Document your independent review of radiology images and any outside records  Document your discussion with family members, caretakers and with consultants  Document social determinants of health affecting pt's care  Document your decision making why or why not admission, treatments were needed:32947:::1}   Final diagnoses:  None    ED Discharge Orders     None

## 2024-06-28 NOTE — ED Triage Notes (Signed)
 Pt arrive by [facility] from home with care givers for c/o increase fatigue, pt been more lethargic than usual concern about possible UTI. BP 160/90, HR 64 bpm, 12 RR, 96% RA. CBG 98. Per care giver pt is normal at baseline just more lethargic.

## 2024-06-28 NOTE — ED Provider Notes (Signed)
 [place] [facility]ERGENC[facility] DEPARTMENT AT [facility] Provider Note   CSN: [id number] Arrival date & time: 06/28/24  2100     Patient presents with: Fatigue   Fred [name] is a 73 y.o. male with history of acute UTI, AKI, diabetes, hypertension, mental retardation.  Presents to ED with caregiver for evaluation of fatigue and altered mental status.  Per caregiver the patient reportedly is less aware, less responsive than he usually is.  Caregiver reports that the patient will typically be much more active and aware of what is going on than he is currently.  He reports that for about 2 hours he was seemingly more altered, confused and seemed lethargic.  He reports that upon arrival to the ED, after EMS was called, the patient does seem to be back to his baseline.  The patient caregiver reports that he has been urinating on himself more often in the last 2 days and this is typically indicative of a UTI. The patient on my exam is at his baseline per caregiver shuffling cards in bed in no apparent distress. He follows commands appropriately.   HPI     Prior to Admission medications   Medication Sig Start Date End Date Taking? Authorizing Provider  alfuzosin  (UROXATRAL ) 10 MG 24 hr tablet Take 10 mg by mouth daily after breakfast.  03/30/20   [provider]  ALLERGY RELIEF 10 MG tablet Take 10 mg by mouth daily.    [provider]  amLODipine  (NORVASC ) 10 MG tablet Take 10 mg by mouth daily.  05/22/19   [provider]  amoxicillin -clavulanate (AUGMENTIN ) 875-125 MG tablet Take 1 tablet by mouth 2 (two) times daily. 04/20/24   Danford, Lonni SQUIBB, MD  aspirin  EC 81 MG tablet Take 81 mg by mouth daily.    [provider]  atorvastatin  (LIPITOR) 20 MG tablet Take 20 mg by mouth daily.  02/28/18   [provider]  benztropine  (COGENTIN ) 0.5 MG tablet Take 0.5 mg by mouth daily.     [provider]  carvedilol  (COREG ) 6.25 MG tablet Take 1  tablet (6.25 mg total) by mouth 2 (two) times daily with a meal. 01/03/23   Jillian Buttery, MD  colchicine  0.6 MG tablet Take 1 tablet (0.6 mg total) by mouth daily. 03/14/24   Smucker, Melvenia, MD  ferrous sulfate  325 (65 FE) MG tablet Take 325 mg by mouth every Monday, Wednesday, and Friday.  03/27/18   [provider]  ketoconazole (NIZORAL) 2 % shampoo Apply 1 Application topically 2 (two) times a week.    [provider]  lisinopril-hydrochlorothiazide (ZESTORETIC) 20-25 MG tablet Take by mouth.    [provider]  metFORMIN (GLUCOPHAGE) 500 MG tablet Take 500 mg by mouth 2 (two) times daily.  01/20/15   [provider]  potassium chloride  (KLOR-CON ) 10 MEQ tablet Take 10 mEq by mouth daily.    [provider]  risperiDONE  (RISPERDAL ) 0.5 MG tablet Take 0.5 mg by mouth daily.    [provider]  risperiDONE  (RISPERDAL ) 3 MG tablet Take 3 mg by mouth at bedtime. 05/16/19   [provider]  sertraline  (ZOLOFT ) 50 MG tablet Take 50 mg by mouth daily.    [provider]    Allergies: Patient has no known allergies.    Review of Systems  Unable to perform ROS: Other (Level 5 caveat)    Updated Vital Signs BP (!) 149/92   Pulse 73   Temp 98.1 F (36.7 C) (Axillary)  Resp 16   Ht 5' 7 (1.702 m)   Wt 58 kg   SpO2 100%   BMI 20.03 kg/m   Physical Exam Vitals and nursing note reviewed.  Constitutional:      General: He is not in acute distress.    Appearance: He is well-developed.  HENT:     Head: Normocephalic and atraumatic.  Eyes:     Conjunctiva/sclera: Conjunctivae normal.  Cardiovascular:     Rate and Rhythm: Normal rate and regular rhythm.     Heart sounds: No murmur heard. Pulmonary:     Effort: Pulmonary effort is normal. No respiratory distress.     Breath sounds: Normal breath sounds.  Abdominal:     Palpations: Abdomen is soft.     Tenderness: There is no abdominal tenderness.   Musculoskeletal:        General: No swelling.     Cervical back: Neck supple.  Skin:    General: Skin is warm and dry.     Capillary Refill: Capillary refill takes less than 2 seconds.  Neurological:     Mental Status: He is alert. Mental status is at baseline.     Comments: Patient follows commands appropriately.  He is slightly somnolent and slowed per caregiver at bedside.  Psychiatric:        Mood and Affect: Mood normal.     (all labs ordered are listed, but only abnormal results are displayed) Labs Reviewed  COMPREHENSIVE METABOLIC PANEL WITH GFR - Abnormal; Notable for the following components:      Result Value   Potassium 3.2 (*)    Calcium  8.1 (*)    Total Protein 5.9 (*)    Albumin 2.7 (*)    All other components within normal limits  CBC WITH DIFFERENTIAL/PLATELET - Abnormal; Notable for the following components:   RBC 3.99 (*)    Hemoglobin 11.1 (*)    HCT 35.9 (*)    RDW 16.5 (*)    Platelets 142 (*)    All other components within normal limits  URINALYSIS, ROUTINE W REFLEX MICROSCOPIC - Abnormal; Notable for the following components:   Color, Urine STRAW (*)    Protein, ur 100 (*)    Leukocytes,Ua TRACE (*)    Bacteria, UA RARE (*)    All other components within normal limits  URINE CULTURE  LIPASE, BLOOD  BASIC METABOLIC PANEL WITH GFR  CBC  PROTIME-INR    EKG: None  Radiology: CT ABDOMEN PELVIS W CONTRAST Result Date: 06/29/2024 EXAM: CT ABDOMEN AND PELVIS WITH CONTRAST 06/28/2024 11:41:16 PM TECHNIQUE: CT of the abdomen and pelvis was performed with the administration of 75 mL of intravenous iohexol  (OMNIPAQUE ) 350 MG/ML injection. Multiplanar reformatted images are provided for review. Automated exposure control, iterative reconstruction, and/or weight-based adjustment of the mA/kV was utilized to reduce the radiation dose to as low as reasonably achievable. COMPARISON: 04/15/2024 CLINICAL HISTORY: Abdominal pain, acute, nonlocalized. FINDINGS:  LOWER CHEST: Moderate left pleural effusion. Patchy ground glass opacities in the left lower lobe could reflect atelectasis or infiltrate. The previously seen large right pleural effusion has resolved. Mild cardiomegaly. Aortic atherosclerosis. LIVER: The liver is unremarkable. GALLBLADDER AND BILE DUCTS: Gallbladder is unremarkable. No biliary ductal dilatation. SPLEEN: No acute abnormality. PANCREAS: No acute abnormality. ADRENAL GLANDS: No acute abnormality. KIDNEYS, URETERS AND BLADDER: Numerous bilateral renal cysts appear simple and stable. Per consensus, no follow-up is needed for simple Bosniak type 1 and 2 renal cysts, unless the patient has a malignancy history or  risk factors. No stones in the kidneys or ureters. No hydronephrosis. No perinephric or periureteral stranding. Urinary bladder is unremarkable. GI AND BOWEL: Stomach demonstrates no acute abnormality. Large stool burden throughout the colon. Normal appendix. There is no bowel obstruction. PERITONEUM AND RETROPERITONEUM: No ascites. No free air. VASCULATURE: Aorta is normal in caliber. Aortic atherosclerosis. LYMPH NODES: No lymphadenopathy. REPRODUCTIVE ORGANS: No acute abnormality. BONES AND SOFT TISSUES: Degenerative disc and facet disease in the lumbar spine. No focal soft tissue abnormality. IMPRESSION: 1. Moderate left pleural effusion with patchy left lower lobe ground-glass opacities, which may reflect atelectasis or infiltrate. 2. . No acute findings in the abdomen or pelvis. 3. Aortic atherosclerosis. Electronically signed by: Franky Crease MD 06/29/2024 12:02 AM EDT RP Workstation: [id number]   CT Head Wo Contrast Result Date: 06/29/2024 CLINICAL DATA:  Altered mental status. EXAM: CT HEAD WITHOUT CONTRAST TECHNIQUE: Contiguous axial images were obtained from the base of the skull through the vertex without intravenous contrast. RADIATION DOSE REDUCTION: This exam was performed according to the departmental dose-optimization program  which includes automated exposure control, adjustment of the mA and/or kV according to patient size and/or use of iterative reconstruction technique. COMPARISON:  Jan 03, 2024 FINDINGS: Brain: There is generalized cerebral atrophy with widening of the extra-axial spaces and ventricular dilatation. There are areas of decreased attenuation within the white matter tracts of the supratentorial brain, consistent with microvascular disease changes. Vascular: Moderate to marked severity bilateral cavernous carotid artery calcification is noted. Skull: Normal. Negative for fracture or focal lesion. Sinuses/Orbits: No acute finding. Other: None. IMPRESSION: 1. Generalized cerebral atrophy and microvascular disease changes of the supratentorial brain. 2. No acute intracranial abnormality. Electronically Signed   By: Suzen Dials M.D.   On: 06/29/2024 00:01   DG Chest Portable 1 View Result Date: 06/28/2024 EXAM: 1 VIEW(S) XRAY OF THE CHEST 06/28/2024 11:21:54 PM COMPARISON: 06/20/2024 CLINICAL HISTORY: ams, tachypnea, assess for pleural effusions, hx of the same FINDINGS: LUNGS AND PLEURA: Increasing left lower lobe airspace opacity concerning for pneumonia. No pulmonary edema. No pleural effusion. No pneumothorax. HEART AND MEDIASTINUM: No acute abnormality of the cardiac and mediastinal silhouettes. BONES AND SOFT TISSUES: No acute osseous abnormality. IMPRESSION: 1. Increasing left lower lobe airspace opacity concerning for pneumonia. Electronically signed by: Franky Crease MD 06/28/2024 11:31 PM EDT RP Workstation: [id number]    Procedures   Medications Ordered in the ED  aspirin  EC tablet 81 mg (has no administration in time range)  colchicine  tablet 0.6 mg (has no administration in time range)  atorvastatin  (LIPITOR) tablet 20 mg (has no administration in time range)  risperiDONE  (RISPERDAL ) tablet 0.5 mg (has no administration in time range)  risperiDONE  (RISPERDAL ) tablet 3 mg (has no administration in  time range)  sertraline  (ZOLOFT ) tablet 50 mg (has no administration in time range)  alfuzosin  (UROXATRAL ) 24 hr tablet 10 mg (has no administration in time range)  ferrous sulfate  tablet 325 mg (has no administration in time range)  benztropine  (COGENTIN ) tablet 0.5 mg (has no administration in time range)  enoxaparin  (LOVENOX ) injection 40 mg (has no administration in time range)  acetaminophen  (TYLENOL ) tablet 650 mg (has no administration in time range)    Or  acetaminophen  (TYLENOL ) suppository 650 mg (has no administration in time range)  ondansetron  (ZOFRAN ) tablet 4 mg (has no administration in time range)    Or  ondansetron  (ZOFRAN ) injection 4 mg (has no administration in time range)  amLODipine  (NORVASC ) tablet 10 mg (10 mg Oral Given 06/28/24  2308)  carvedilol  (COREG ) tablet 6.25 mg (6.25 mg Oral Given 06/28/24 2307)  iohexol  (OMNIPAQUE ) 350 MG/ML injection 75 mL (75 mLs Intravenous Contrast Given 06/28/24 2341)  cefTRIAXone  (ROCEPHIN ) 1 g in sodium chloride  0.9 % 100 mL IVPB (0 g Intravenous Stopped 06/29/24 0059)  azithromycin  (ZITHROMAX ) 500 mg in sodium chloride  0.9 % 250 mL IVPB (0 mg Intravenous Stopped 06/29/24 0221)  potassium chloride  (KLOR-CON ) packet 60 mEq (60 mEq Oral Given 06/29/24 0323)    Clinical Course as of 06/29/24 0401  Sat Jun 29, 2024  0154 Admit for CAP with PORT score = 114 [CG]    Clinical Course User Index [CG] Ruthell Lonni FALCON, PA-C   Medical Decision Making Amount and/or Complexity of Data Reviewed Radiology: ordered.  Risk Prescription drug management.   This is a 73 year old man presenting to the ED with concerns of fatigue, altered mental status, concern of UTI.  On exam, the patient is afebrile and nontachycardic.  His lung sounds are clear bilaterally, there is no hypoxia.  Abdomen is soft and compressible with no tenderness noted.  Neurological examination is at baseline.  The patient is slightly somnolent and slow to respond.  He  follows commands appropriately.  Patient assessed utilizing CBC, CMP, lipase, urinalysis, CT abdomen and pelvis.  Patient arrives hypertensive so was given home dose of amlodipine  and carvedilol .  Per caregiver, patient was taken off of all blood pressure medication at recent admission.  Patient CBC is without leukocytosis, he has baseline hemoglobin.  His metabolic panel reveals a potassium of 3.2 which was repleted with 60 mEq of oral potassium by packet, no elevated LFT, anion gap 11, stable creatinine 1.15.  His urinalysis shows trace leukocytes, rare bacteria, this was cultured.  His lipase is 21.  CT scan of head obtained due to altered mental status.  Unremarkable.  CT scan of abdomen and pelvis obtained as well which shows moderate left pleural effusion with patchy left lower lobe ground glass opacities which may reflect atelectasis or infiltrate.  There are no acute findings in the abdomen or pelvis.  Concern of pneumonia confirmed on chest x-ray.  Patient started on Rocephin  for community-acquired pneumonia as well as UTI, also started on azithromycin .  Patient blood pressure has reduced to 149/92.  Patient PORT score is 114.  Patient has intellectual disability and resides in a group home.  I feel this patient is high risk to send home.  Patient will be admitted to hospital at this time for IV and biotics.  He was discussed with Dr. Dena of the [facility] who has agreed to admit the patient.  The patient is stable on admission.    Final diagnoses:  Lower urinary tract infectious disease  Community acquired pneumonia, unspecified laterality    ED Discharge Orders     None          Ruthell Lonni FALCON DEVONNA 06/29/24 0401    Palumbo, April, MD 06/29/24 [phone]

## 2024-06-29 DIAGNOSIS — F39 Unspecified mood [affective] disorder: Secondary | ICD-10-CM | POA: Diagnosis present

## 2024-06-29 DIAGNOSIS — Z87440 Personal history of urinary (tract) infections: Secondary | ICD-10-CM | POA: Diagnosis not present

## 2024-06-29 DIAGNOSIS — Z66 Do not resuscitate: Secondary | ICD-10-CM | POA: Diagnosis present

## 2024-06-29 DIAGNOSIS — I480 Paroxysmal atrial fibrillation: Secondary | ICD-10-CM | POA: Diagnosis present

## 2024-06-29 DIAGNOSIS — E785 Hyperlipidemia, unspecified: Secondary | ICD-10-CM | POA: Diagnosis present

## 2024-06-29 DIAGNOSIS — G9341 Metabolic encephalopathy: Secondary | ICD-10-CM | POA: Diagnosis present

## 2024-06-29 DIAGNOSIS — J159 Unspecified bacterial pneumonia: Secondary | ICD-10-CM | POA: Diagnosis present

## 2024-06-29 DIAGNOSIS — B999 Unspecified infectious disease: Secondary | ICD-10-CM | POA: Diagnosis present

## 2024-06-29 DIAGNOSIS — I5032 Chronic diastolic (congestive) heart failure: Secondary | ICD-10-CM | POA: Diagnosis present

## 2024-06-29 DIAGNOSIS — G9349 Other encephalopathy: Secondary | ICD-10-CM | POA: Diagnosis present

## 2024-06-29 DIAGNOSIS — Z7982 Long term (current) use of aspirin: Secondary | ICD-10-CM | POA: Diagnosis not present

## 2024-06-29 DIAGNOSIS — I2510 Atherosclerotic heart disease of native coronary artery without angina pectoris: Secondary | ICD-10-CM | POA: Diagnosis present

## 2024-06-29 DIAGNOSIS — J690 Pneumonitis due to inhalation of food and vomit: Secondary | ICD-10-CM | POA: Diagnosis present

## 2024-06-29 DIAGNOSIS — Z87891 Personal history of nicotine dependence: Secondary | ICD-10-CM | POA: Diagnosis not present

## 2024-06-29 DIAGNOSIS — B952 Enterococcus as the cause of diseases classified elsewhere: Secondary | ICD-10-CM | POA: Diagnosis present

## 2024-06-29 DIAGNOSIS — F79 Unspecified intellectual disabilities: Secondary | ICD-10-CM | POA: Diagnosis present

## 2024-06-29 DIAGNOSIS — N179 Acute kidney failure, unspecified: Secondary | ICD-10-CM | POA: Diagnosis not present

## 2024-06-29 DIAGNOSIS — N390 Urinary tract infection, site not specified: Secondary | ICD-10-CM | POA: Diagnosis present

## 2024-06-29 DIAGNOSIS — I110 Hypertensive heart disease with heart failure: Secondary | ICD-10-CM | POA: Diagnosis present

## 2024-06-29 DIAGNOSIS — N400 Enlarged prostate without lower urinary tract symptoms: Secondary | ICD-10-CM | POA: Diagnosis present

## 2024-06-29 DIAGNOSIS — M109 Gout, unspecified: Secondary | ICD-10-CM | POA: Diagnosis present

## 2024-06-29 DIAGNOSIS — E119 Type 2 diabetes mellitus without complications: Secondary | ICD-10-CM | POA: Diagnosis not present

## 2024-06-29 DIAGNOSIS — Z7984 Long term (current) use of oral hypoglycemic drugs: Secondary | ICD-10-CM | POA: Diagnosis not present

## 2024-06-29 DIAGNOSIS — Z79899 Other long term (current) drug therapy: Secondary | ICD-10-CM | POA: Diagnosis not present

## 2024-06-29 DIAGNOSIS — J189 Pneumonia, unspecified organism: Secondary | ICD-10-CM | POA: Diagnosis not present

## 2024-06-29 LAB — URINALYSIS, ROUTINE W REFLEX MICROSCOPIC
Bilirubin Urine: NEGATIVE
Glucose, UA: NEGATIVE mg/dL
Hgb urine dipstick: NEGATIVE
Ketones, ur: NEGATIVE mg/dL
Nitrite: NEGATIVE
Protein, ur: 100 mg/dL — AB
Specific Gravity, Urine: 1.016 (ref 1.005–1.030)
pH: 8 (ref 5.0–8.0)

## 2024-06-29 LAB — BASIC METABOLIC PANEL WITH GFR
Anion gap: 13 (ref 5–15)
BUN: 19 mg/dL (ref 8–23)
CO2: 24 mmol/L (ref 22–32)
Calcium: 9 mg/dL (ref 8.9–10.3)
Chloride: 105 mmol/L (ref 98–111)
Creatinine, Ser: 1.19 mg/dL (ref 0.61–1.24)
GFR, Estimated: >60 mL/min (ref >60)
Glucose, Bld: 70 mg/dL (ref 70–99)
Potassium: 4.1 mmol/L (ref 3.5–5.1)
Sodium: 142 mmol/L (ref 135–145)

## 2024-06-29 LAB — PROTIME-INR
INR: 1.1 (ref 0.8–1.2)
Prothrombin Time: 15.1 s (ref 11.4–15.2)

## 2024-06-29 LAB — CBC
HCT: 40 % (ref 39.0–52.0)
Hemoglobin: 12.5 g/dL — ABNORMAL LOW (ref 13.0–17.0)
MCH: 27.8 pg (ref 26.0–34.0)
MCHC: 31.3 g/dL (ref 30.0–36.0)
MCV: 89.1 fL (ref 80.0–100.0)
Platelets: 171 K/uL (ref 150–400)
RBC: 4.49 MIL/uL (ref 4.22–5.81)
RDW: 16.6 % — ABNORMAL HIGH (ref 11.5–15.5)
WBC: 4.8 K/uL (ref 4.0–10.5)
nRBC: 0 % (ref 0.0–0.2)

## 2024-06-29 MED ORDER — ENOXAPARIN SODIUM 40 MG/0.4ML IJ SOSY
40.0000 mg | PREFILLED_SYRINGE | Freq: Every day | INTRAMUSCULAR | Status: DC
  Administered 2024-06-29 – 2024-07-04 (×6): 40 mg via SUBCUTANEOUS
  Filled 2024-06-29 (×6): qty 0.4

## 2024-06-29 MED ORDER — ONDANSETRON HCL 4 MG PO TABS: 4.0000 mg | ORAL_TABLET | Freq: Four times a day (QID) | ORAL | Status: DC | PRN

## 2024-06-29 MED ORDER — SODIUM CHLORIDE 0.9 % IV SOLN
500.0000 mg | INTRAVENOUS | Status: AC
  Administered 2024-06-29 – 2024-07-01 (×3): 500 mg via INTRAVENOUS
  Filled 2024-06-29 (×3): qty 5

## 2024-06-29 MED ORDER — FERROUS SULFATE 325 (65 FE) MG PO TABS
325.0000 mg | ORAL_TABLET | ORAL | Status: DC
  Administered 2024-07-01 – 2024-07-03 (×2): 325 mg via ORAL
  Filled 2024-06-29 (×2): qty 1

## 2024-06-29 MED ORDER — SODIUM CHLORIDE 0.9 % IV SOLN
1.0000 g | Freq: Once | INTRAVENOUS | Status: AC
  Administered 2024-06-29: 1 g via INTRAVENOUS
  Filled 2024-06-29: qty 10

## 2024-06-29 MED ORDER — POTASSIUM CHLORIDE CRYS ER 20 MEQ PO TBCR: 40.0000 meq | EXTENDED_RELEASE_TABLET | Freq: Once | ORAL | Status: DC

## 2024-06-29 MED ORDER — SERTRALINE HCL 50 MG PO TABS
50.0000 mg | ORAL_TABLET | Freq: Every day | ORAL | Status: DC
  Administered 2024-06-29 – 2024-07-04 (×6): 50 mg via ORAL
  Filled 2024-06-29 (×6): qty 1

## 2024-06-29 MED ORDER — ASPIRIN 81 MG PO TBEC
81.0000 mg | DELAYED_RELEASE_TABLET | Freq: Every day | ORAL | Status: DC
  Administered 2024-06-29 – 2024-07-04 (×6): 81 mg via ORAL
  Filled 2024-06-29 (×6): qty 1

## 2024-06-29 MED ORDER — POTASSIUM CHLORIDE 20 MEQ PO PACK: 60.0000 meq | PACK | Freq: Every day | ORAL | Status: DC

## 2024-06-29 MED ORDER — ATORVASTATIN CALCIUM 10 MG PO TABS
20.0000 mg | ORAL_TABLET | Freq: Every day | ORAL | Status: DC
  Administered 2024-06-29 – 2024-07-04 (×6): 20 mg via ORAL
  Filled 2024-06-29 (×6): qty 2

## 2024-06-29 MED ORDER — SODIUM CHLORIDE 0.9 % IV SOLN
500.0000 mg | Freq: Once | INTRAVENOUS | Status: AC
  Administered 2024-06-29: 500 mg via INTRAVENOUS
  Filled 2024-06-29: qty 5

## 2024-06-29 MED ORDER — SODIUM CHLORIDE 0.9 % IV SOLN
1.0000 g | INTRAVENOUS | Status: DC
  Administered 2024-06-29: 1 g via INTRAVENOUS
  Filled 2024-06-29: qty 10

## 2024-06-29 MED ORDER — ONDANSETRON HCL 4 MG/2ML IJ SOLN: 4.0000 mg | Freq: Four times a day (QID) | INTRAMUSCULAR | Status: DC | PRN

## 2024-06-29 MED ORDER — ACETAMINOPHEN 325 MG PO TABS: 650.0000 mg | ORAL_TABLET | Freq: Four times a day (QID) | ORAL | Status: DC | PRN

## 2024-06-29 MED ORDER — ALFUZOSIN HCL ER 10 MG PO TB24
10.0000 mg | ORAL_TABLET | Freq: Every day | ORAL | Status: DC
  Administered 2024-06-29 – 2024-07-04 (×6): 10 mg via ORAL
  Filled 2024-06-29 (×6): qty 1

## 2024-06-29 MED ORDER — POTASSIUM CHLORIDE 20 MEQ PO PACK
60.0000 meq | PACK | ORAL | Status: AC
  Administered 2024-06-29: 60 meq via ORAL
  Filled 2024-06-29: qty 3

## 2024-06-29 MED ORDER — ACETAMINOPHEN 650 MG RE SUPP: 650.0000 mg | Freq: Four times a day (QID) | RECTAL | Status: DC | PRN

## 2024-06-29 MED ORDER — RISPERIDONE 1 MG PO TABS
0.5000 mg | ORAL_TABLET | Freq: Every day | ORAL | Status: DC
  Administered 2024-06-29 – 2024-07-04 (×6): 0.5 mg via ORAL
  Filled 2024-06-29 (×7): qty 1

## 2024-06-29 MED ORDER — RISPERIDONE 1 MG PO TABS
3.0000 mg | ORAL_TABLET | Freq: Every day | ORAL | Status: DC
  Administered 2024-06-29 – 2024-07-03 (×5): 3 mg via ORAL
  Filled 2024-06-29 (×5): qty 3

## 2024-06-29 MED ORDER — COLCHICINE 0.6 MG PO TABS
0.6000 mg | ORAL_TABLET | Freq: Every day | ORAL | Status: DC
  Administered 2024-06-29 – 2024-07-04 (×6): 0.6 mg via ORAL
  Filled 2024-06-29 (×6): qty 1

## 2024-06-29 MED ORDER — BENZTROPINE MESYLATE 0.5 MG PO TABS
0.5000 mg | ORAL_TABLET | Freq: Every day | ORAL | Status: DC
  Administered 2024-06-29 – 2024-07-04 (×6): 0.5 mg via ORAL
  Filled 2024-06-29 (×6): qty 1

## 2024-06-29 NOTE — Progress Notes (Addendum)
 PROGRESS NOTE    Fred [name]  FMW:[id number] DOB: 1950-11-05 DOA: 06/28/2024 PCP: Pura Lenis, MD    Chief Complaint  Patient presents with   Fatigue    Brief Narrative:    Fred [name] is a 73 y.o. male with medical history significant of recurrent UTI, hypertension, cognitive delay who presents emergency department with a caretaker due to fatigue and altered mental status.  Patient is less aware and less responsive than usual.  Caregiver reports that he seemed altered and confused and not his usual self.  He presented to the ER where he was found to be afebrile and hemodynamically stable.  There was concern that he was urinating on himself.  Labs were [facility] which showed potassium 3.2, creatinine 1.15, lipase 21, WBC 5.0, hemoglobin 11.1, platelets 142, urinalysis negative for infection.  CT abdomen pelvis was obtained which showed left-sided pleural effusion and patchy ground glass opacities.  CT head showed no acute findings.  Patient was given ceftriaxone  and azithromycin  and admitted for further workup.  On evaluation he was unable to provide any history regarding his presentation.  No caretaker was at bedside.  He was resting comfortably.  Denied complaints.   Assessment & Plan:   Principal Problem:   Infectious encephalopathy   This is a no charge note as patient was seen and admitted earlier today by Dr. Dena, patient was seen and examined, chart and imaging were reviewed   Acute infectious encephalopathy most likely secondary to bacterial commune acquired pneumonia - Patient presented with changes in mental status - History of recurrent UTI - Urinalysis negative for infection -Continue ceftriaxone  and azithromycin  due to opacities seen on CT  - Has a presentation in August at which time he was found to have a pleural effusion.  Pulmonology was consulted at that time - Concern for aspiration, as sister reports cough while eating, will consult SLP   History of CHF,  not in exacerbation-patient found to have a pericardial effusion and underwent drain placement in July.  Started on colchicine .      # Cognitive impairment/mood disorder-continue risperidone , Zoloft , benztropine    # Hyperlipidemia-continue Lipitor   # CAD-continue aspirin    # Urinary tension-continue alfozosin   # History of gout-continue colchicine     DVT prophylaxis: (Lovenox ) Code Status: (DNR) Family Communication: (D/W sister at bedside  Status is: Inpatient    Consultants:  none   Subjective:  Patient cannot provide any reliable complaints, no significant events as discussed with sister at bedside  Objective: Vitals:   06/29/24 0400 06/29/24 0614 06/29/24 0804 06/29/24 1154  BP: (!) 152/88 (!) 145/95 (!) 141/72 (!) 152/95  Pulse: 88 62    Resp: 14 18    Temp: 98.3 F (36.8 C)  97.6 F (36.4 C) 97.6 F (36.4 C)  TempSrc:   Oral Axillary  SpO2: 100% 97%    Weight:  54.9 kg    Height:  5' 7 (1.702 m)      Intake/Output Summary (Last 24 hours) at 06/29/2024 1217 Last data filed at 06/29/2024 1155 Gross per 24 hour  Intake 350 ml  Output 300 ml  Net 50 ml   Filed Weights   06/28/24 2104 06/29/24 0614  Weight: 58 kg 54.9 kg    Examination:  Awake, pleasant, extremely frail and deconditioned Rales at the bases RRR,No Gallops,Rubs or new Murmurs, No Parasternal Heave +ve B.Sounds No Cyanosis, Clubbing or edema    Data Reviewed: I have personally reviewed following labs and imaging studies  CBC:  Recent Labs  Lab 06/28/24 2152 06/29/24 0451  WBC 5.0 4.8  NEUTROABS 3.6  --   HGB 11.1* 12.5*  HCT 35.9* 40.0  MCV 90.0 89.1  PLT 142* 171    Basic Metabolic Panel: Recent Labs  Lab 06/28/24 2152 06/29/24 0451  NA 143 142  K 3.2* 4.1  CL 108 105  CO2 24 24  GLUCOSE 85 70  BUN 22 19  CREATININE 1.15 1.19  CALCIUM  8.1* 9.0    GFR: Estimated Creatinine Clearance: 42.3 mL/min (by C-G formula based on SCr of 1.19 mg/dL).  Liver  Function Tests: Recent Labs  Lab 06/28/24 2152  AST 17  ALT 14  ALKPHOS 54  BILITOT 0.5  PROT 5.9*  ALBUMIN 2.7*    CBG: No results for input(s): GLUCAP in the last 168 hours.   Recent Results (from the past 240 hours)  Body fluid culture w Gram Stain     Status: None   Collection Time: 06/20/24  2:16 PM   Specimen: Lung, Left; Pleural Fluid  Result Value Ref Range Status   Specimen Description PLEURAL  Final   Special Requests LEFT LUNG  Final   Gram Stain   Final    RARE WBC PRESENT,BOTH PMN AND MONONUCLEAR NO ORGANISMS SEEN    Culture   Final    NO GROWTH 3 DAYS Performed at [facility], [address] [place], [place] [address]    Report Status 06/23/2024 FINAL  Final         Radiology Studies: CT ABDOMEN PELVIS W CONTRAST Result Date: 06/29/2024 EXAM: CT ABDOMEN AND PELVIS WITH CONTRAST 06/28/2024 11:41:16 PM TECHNIQUE: CT of the abdomen and pelvis was performed with the administration of 75 mL of intravenous iohexol  (OMNIPAQUE ) 350 MG/ML injection. Multiplanar reformatted images are provided for review. Automated exposure control, iterative reconstruction, and/or weight-based adjustment of the mA/kV was utilized to reduce the radiation dose to as low as reasonably achievable. COMPARISON: 04/15/2024 CLINICAL HISTORY: Abdominal pain, acute, nonlocalized. FINDINGS: LOWER CHEST: Moderate left pleural effusion. Patchy ground glass opacities in the left lower lobe could reflect atelectasis or infiltrate. The previously seen large right pleural effusion has resolved. Mild cardiomegaly. Aortic atherosclerosis. LIVER: The liver is unremarkable. GALLBLADDER AND BILE DUCTS: Gallbladder is unremarkable. No biliary ductal dilatation. SPLEEN: No acute abnormality. PANCREAS: No acute abnormality. ADRENAL GLANDS: No acute abnormality. KIDNEYS, URETERS AND BLADDER: Numerous bilateral renal cysts appear simple and stable. Per consensus, no follow-up is needed for simple Bosniak  type 1 and 2 renal cysts, unless the patient has a malignancy history or risk factors. No stones in the kidneys or ureters. No hydronephrosis. No perinephric or periureteral stranding. Urinary bladder is unremarkable. GI AND BOWEL: Stomach demonstrates no acute abnormality. Large stool burden throughout the colon. Normal appendix. There is no bowel obstruction. PERITONEUM AND RETROPERITONEUM: No ascites. No free air. VASCULATURE: Aorta is normal in caliber. Aortic atherosclerosis. LYMPH NODES: No lymphadenopathy. REPRODUCTIVE ORGANS: No acute abnormality. BONES AND SOFT TISSUES: Degenerative disc and facet disease in the lumbar spine. No focal soft tissue abnormality. IMPRESSION: 1. Moderate left pleural effusion with patchy left lower lobe ground-glass opacities, which may reflect atelectasis or infiltrate. 2. . No acute findings in the abdomen or pelvis. 3. Aortic atherosclerosis. Electronically signed by: Franky Crease MD 06/29/2024 12:02 AM EDT RP Workstation: [id number]   CT Head Wo Contrast Result Date: 06/29/2024 CLINICAL DATA:  Altered mental status. EXAM: CT HEAD WITHOUT CONTRAST TECHNIQUE: Contiguous axial images were obtained from the base  of the skull through the vertex without intravenous contrast. RADIATION DOSE REDUCTION: This exam was performed according to the departmental dose-optimization program which includes automated exposure control, adjustment of the mA and/or kV according to patient size and/or use of iterative reconstruction technique. COMPARISON:  Jan 03, 2024 FINDINGS: Brain: There is generalized cerebral atrophy with widening of the extra-axial spaces and ventricular dilatation. There are areas of decreased attenuation within the white matter tracts of the supratentorial brain, consistent with microvascular disease changes. Vascular: Moderate to marked severity bilateral cavernous carotid artery calcification is noted. Skull: Normal. Negative for fracture or focal lesion.  Sinuses/Orbits: No acute finding. Other: None. IMPRESSION: 1. Generalized cerebral atrophy and microvascular disease changes of the supratentorial brain. 2. No acute intracranial abnormality. Electronically Signed   By: Suzen Dials M.D.   On: 06/29/2024 00:01   DG Chest Portable 1 View Result Date: 06/28/2024 EXAM: 1 VIEW(S) XRAY OF THE CHEST 06/28/2024 11:21:54 PM COMPARISON: 06/20/2024 CLINICAL HISTORY: ams, tachypnea, assess for pleural effusions, hx of the same FINDINGS: LUNGS AND PLEURA: Increasing left lower lobe airspace opacity concerning for pneumonia. No pulmonary edema. No pleural effusion. No pneumothorax. HEART AND MEDIASTINUM: No acute abnormality of the cardiac and mediastinal silhouettes. BONES AND SOFT TISSUES: No acute osseous abnormality. IMPRESSION: 1. Increasing left lower lobe airspace opacity concerning for pneumonia. Electronically signed by: Kevin Dover MD 06/28/2024 11:31 PM EDT RP Workstation: [id number]        Scheduled Meds:  alfuzosin   10 mg Oral Daily   aspirin  EC  81 mg Oral Daily   atorvastatin   20 mg Oral Daily   benztropine   0.5 mg Oral Daily   colchicine   0.6 mg Oral Daily   enoxaparin  (LOVENOX ) injection  40 mg Subcutaneous Daily   [START ON 07/01/2024] ferrous sulfate   325 mg Oral Q M,W,F   risperiDONE   0.5 mg Oral Daily   risperiDONE   3 mg Oral QHS   sertraline   50 mg Oral Daily   Continuous Infusions:  azithromycin      cefTRIAXone  (ROCEPHIN )  IV       LOS: 0 days      Brayton Lye, MD [facility]   To contact the attending provider between 7A-7P or the covering provider during after hours 7P-7A, please log into the web site [email or web address] and access using universal [place] password for that web site. If you do not have the password, please call the hospital operator.  06/29/2024, 12:17 PM

## 2024-06-29 NOTE — Evaluation (Signed)
 Physical Therapy Evaluation Patient Details Name: Fred [name] MRN: [id number] DOB: March 29, 1951 Today's Date: 06/29/2024  History of Present Illness  Pt is a 73 y.o. M who presents 06/28/2024 with fatigue and AMS. T abdomen pelvis was obtained which showed left-sided pleural effusion and patchy ground glass opacities.  CT head showed no acute findings.  Patient was given ceftriaxone  and azithromycin  and admitted for further workup. Significant PMH: recurrent UTI, hypertension, cognitive delay.  Clinical Impression  PTA, pt lives at a group home and is ambulatory without an AD and requires assist for ADL's; pt has 24/7 assist. Pt seems to presents fairly close to his functional baseline. Requiring min assist for mobility, ambulating 105 ft with handheld assist. HR peak 115 bpm, SpO2 97-99% on RA. No PT follow up anticipated. Will benefit from continued mobility while inpatient.        If plan is discharge home, recommend the following: A little help with walking and/or transfers;A little help with bathing/dressing/bathroom;Assistance with cooking/housework;Assist for transportation;Help with stairs or ramp for entrance;Supervision due to cognitive status   Can travel by private vehicle        Equipment Recommendations None recommended by PT  Recommendations for Other Services       Functional Status Assessment Patient has not had a recent decline in their functional status     Precautions / Restrictions Precautions Precautions: Fall Restrictions Weight Bearing Restrictions Per Provider Order: No      Mobility  Bed Mobility Overal bed mobility: Needs Assistance Bed Mobility: Supine to Sit     Supine to sit: Min assist     General bed mobility comments: MinA for trunk elevation to sit upright    Transfers Overall transfer level: Needs assistance Equipment used: 1 person hand held assist Transfers: Sit to/from Stand Sit to Stand: Contact guard assist                 Ambulation/Gait Ambulation/Gait assistance: Min assist Gait Distance (Feet): 105 Feet Assistive device: 1 person hand held assist Gait Pattern/deviations: Step-through pattern, Decreased stride length, Narrow base of support Gait velocity: decreased Gait velocity interpretation: <1.31 ft/sec, indicative of household ambulator   General Gait Details: Decreased bilateral foot clearance and narrow BOS, minA for steadying assist  Stairs            Wheelchair Mobility     Tilt Bed    Modified Rankin (Stroke Patients Only)       Balance Overall balance assessment: Needs assistance Sitting-balance support: Feet supported Sitting balance-Leahy Scale: Fair     Standing balance support: Single extremity supported, During functional activity Standing balance-Leahy Scale: Poor                               Pertinent Vitals/Pain Pain Assessment Pain Assessment: Faces Faces Pain Scale: No hurt    Home Living Family/patient expects to be discharged to:: Private residence Living Arrangements: Other relatives (2 brothers) Available Help at Discharge: Family;Personal care attendant;Available 24 hours/day (PCA available 24 hours/day) Type of Home: House Home Access: Stairs to enter Entrance Stairs-Rails: Doctor, General Practice of Steps: 3   Home Layout: One level (3 steps to get to bathroom with R/L rails) Home Equipment: Shower seat Additional Comments: Family home licensed as a group home.    Prior Function Prior Level of Function : Needs assist             Mobility Comments: Day support program  3 days/wk, ambulates with no AD, and no falls ADLs Comments: assist from aide for ADL's     Extremity/Trunk Assessment   Upper Extremity Assessment Upper Extremity Assessment: Defer to OT evaluation    Lower Extremity Assessment Lower Extremity Assessment: Generalized weakness    Cervical / Trunk Assessment Cervical / Trunk Assessment:  Kyphotic  Communication   Communication Communication: Impaired Factors Affecting Communication: Difficulty expressing self    Cognition Arousal: Alert Behavior During Therapy: WFL for tasks assessed/performed   PT - Cognitive impairments: History of cognitive impairments                       PT - Cognition Comments: Hx of cognitive delay. Pt sister reports he can typically follow instructions, one word responses Following commands: Intact       Cueing Cueing Techniques: Verbal cues, Gestural cues     General Comments      Exercises     Assessment/Plan    PT Assessment Patient needs continued PT services  PT Problem List Decreased balance;Decreased mobility       PT Treatment Interventions DME instruction;Gait training;Stair training;Therapeutic activities;Functional mobility training;Therapeutic exercise;Balance training;Patient/family education    PT Goals (Current goals can be found in the Care Plan section)  Acute Rehab PT Goals Patient Stated Goal: pt sister would like him to maintain his mobility PT Goal Formulation: With patient/family Time For Goal Achievement: 07/13/24 Potential to Achieve Goals: Good    Frequency Min 1X/week     Co-evaluation               AM-PAC PT 6 Clicks Mobility  Outcome Measure Help needed turning from your back to your side while in a flat bed without using bedrails?: A Little Help needed moving from lying on your back to sitting on the side of a flat bed without using bedrails?: A Little Help needed moving to and from a bed to a chair (including a wheelchair)?: A Little Help needed standing up from a chair using your arms (e.g., wheelchair or bedside chair)?: A Little Help needed to walk in hospital room?: A Little Help needed climbing 3-5 steps with a railing? : A Little 6 Click Score: 18    End of Session Equipment Utilized During Treatment: Gait belt Activity Tolerance: Patient tolerated treatment  well Patient left: in chair;with call bell/phone within reach;with chair alarm set Nurse Communication: Mobility status PT Visit Diagnosis: Unsteadiness on feet (R26.81)    Time: 8474-8453 PT Time Calculation (min) (ACUTE ONLY): 21 min   Charges:   PT Evaluation $PT Eval Low Complexity: 1 Low   PT General Charges $$ ACUTE PT VISIT: 1 Visit         Aleck Daring, PT, DPT Acute Rehabilitation Services Office [phone]   Aleck ONEIDA Daring 06/29/2024, 3:57 PM

## 2024-06-29 NOTE — H&P (Signed)
 History and Physical    Danen Lapaglia FMW:[id number] DOB: 1951/07/29 DOA: 06/28/2024  PCP: Pura Lenis, MD   Chief Complaint: Fatigue  HPI: Fred [name] is a 73 y.o. male with medical history significant of recurrent UTI, hypertension, cognitive delay who presents emergency department with a caretaker due to fatigue and altered mental status.  Patient is less aware and less responsive than usual.  Caregiver reports that he seemed altered and confused and not his usual self.  He presented to the ER where he was found to be afebrile and hemodynamically stable.  There was concern that he was urinating on himself.  Labs were [facility] which showed potassium 3.2, creatinine 1.15, lipase 21, WBC 5.0, hemoglobin 11.1, platelets 142, urinalysis negative for infection.  CT abdomen pelvis was obtained which showed left-sided pleural effusion and patchy ground glass opacities.  CT head showed no acute findings.  Patient was given ceftriaxone  and azithromycin  and admitted for further workup.  On evaluation he was unable to provide any history regarding his presentation.  No caretaker was at bedside.  He was resting comfortably.  Denied complaints.   Review of Systems: Review of Systems  All other systems reviewed and are negative.    As per HPI otherwise 10 point review of systems negative.   No Known Allergies  Past Medical History:  Diagnosis Date   Acute UTI (urinary tract infection) 01/01/2023   AKI (acute kidney injury) 11/02/2022   Diabetes mellitus    Hypertension    Mental retardation    Severe sepsis (HCC) 11/02/2022    Past Surgical History:  Procedure Laterality Date   IR THORACENTESIS ASP PLEURAL SPACE W/IMG GUIDE  06/20/2024   PERICARDIOCENTESIS N/A 03/09/2024   Procedure: PERICARDIOCENTESIS;  Surgeon: Jordan, Peter M, MD;  Location: [facility] INVASIVE CV LAB;  Service: Cardiovascular;  Laterality: N/A;     reports that he quit smoking about 8 years ago. His smoking use included  cigarettes. He has never used smokeless tobacco. He reports that he does not drink alcohol and does not use drugs.  Family History  Family history unknown: Yes    Prior to Admission medications   Medication Sig Start Date End Date Taking? Authorizing Provider  alfuzosin  (UROXATRAL ) 10 MG 24 hr tablet Take 10 mg by mouth daily after breakfast.  03/30/20   [provider]  ALLERGY RELIEF 10 MG tablet Take 10 mg by mouth daily.    [provider]  amLODipine  (NORVASC ) 10 MG tablet Take 10 mg by mouth daily.  05/22/19   [provider]  amoxicillin -clavulanate (AUGMENTIN ) 875-125 MG tablet Take 1 tablet by mouth 2 (two) times daily. 04/20/24   Danford, Lonni SQUIBB, MD  aspirin  EC 81 MG tablet Take 81 mg by mouth daily.    [provider]  atorvastatin  (LIPITOR) 20 MG tablet Take 20 mg by mouth daily.  02/28/18   [provider]  benztropine  (COGENTIN ) 0.5 MG tablet Take 0.5 mg by mouth daily.     [provider]  carvedilol  (COREG ) 6.25 MG tablet Take 1 tablet (6.25 mg total) by mouth 2 (two) times daily with a meal. 01/03/23   Jillian Buttery, MD  colchicine  0.6 MG tablet Take 1 tablet (0.6 mg total) by mouth daily. 03/14/24   Napoleon Limes, MD  ferrous sulfate  325 (65 FE) MG tablet Take 325 mg by mouth every Monday, Wednesday, and Friday.  03/27/18   [provider]  ketoconazole (NIZORAL) 2 % shampoo Apply 1 Application topically 2 (two)  times a week.    [provider]  lisinopril-hydrochlorothiazide (ZESTORETIC) 20-25 MG tablet Take by mouth.    [provider]  metFORMIN (GLUCOPHAGE) 500 MG tablet Take 500 mg by mouth 2 (two) times daily.  01/20/15   [provider]  potassium chloride  (KLOR-CON ) 10 MEQ tablet Take 10 mEq by mouth daily.    [provider]  risperiDONE  (RISPERDAL ) 0.5 MG tablet Take 0.5 mg by mouth daily.    [provider]  risperiDONE  (RISPERDAL ) 3 MG tablet Take 3 mg by  mouth at bedtime. 05/16/19   [provider]  sertraline  (ZOLOFT ) 50 MG tablet Take 50 mg by mouth daily.    [provider]    Physical Exam: Vitals:   06/29/24 0215 06/29/24 0230 06/29/24 0245 06/29/24 0300  BP: (!) 153/89 (!) 145/82 (!) 141/69 (!) 149/92  Pulse: 73 69 71 73  Resp: 14 11 15 16   Temp:      TempSrc:      SpO2: 99% 100% 99% 100%  Weight:      Height:       Physical Exam Constitutional:      Appearance: He is normal weight.  HENT:     Head: Normocephalic.     Nose: Nose normal.     Mouth/Throat:     Mouth: Mucous membranes are moist.     Pharynx: Oropharynx is clear.  Eyes:     Conjunctiva/sclera: Conjunctivae normal.     Pupils: Pupils are equal, round, and reactive to light.  Cardiovascular:     Rate and Rhythm: Normal rate and regular rhythm.     Pulses: Normal pulses.     Heart sounds: Murmur heard.  Pulmonary:     Effort: Pulmonary effort is normal. No respiratory distress.     Breath sounds: Normal breath sounds.  Abdominal:     General: Abdomen is flat.  Musculoskeletal:        General: Normal range of motion.     Cervical back: Normal range of motion.  Skin:    General: Skin is warm.     Capillary Refill: Capillary refill takes less than 2 seconds.  Neurological:     General: No focal deficit present.     Mental Status: He is alert.  Psychiatric:        Mood and Affect: Mood normal.       Labs on Admission: I have personally reviewed the patients's labs and imaging studies.  Assessment/Plan Principal Problem:   Infectious encephalopathy   # Acute infectious encephalopathy most likely secondary to bacterial commune acquired pneumonia - Patient presented with changes in mental status - History of recurrent UTI - Urinalysis negative for infection - Has a presentation in August at which time he was found to have a pleural effusion.  Pulmonology was consulted at that time  # History of CHF, not in exacerbation-patient  found to have a pericardial effusion and underwent drain placement in July.  Started on colchicine .   Plan: Start ceftriaxone  and azithromycin  due to opacities seen on CT  # Cognitive impairment/mood disorder-continue risperidone , Zoloft , benztropine   # Hyperlipidemia-continue Lipitor  # CAD-continue aspirin   # Urinary tension-continue alfozosin  # History of gout-continue colchicine  Admission status: Inpatient Med-Surg  Certification: The appropriate patient status for this patient is INPATIENT. Inpatient status is judged to be reasonable and necessary in order to provide the required intensity of service to ensure the patient's safety. The patient's presenting symptoms, physical exam findings, and initial  radiographic and laboratory data in the context of their chronic comorbidities is felt to place them at high risk for further clinical deterioration. Furthermore, it is not anticipated that the patient will be medically stable for discharge from the hospital within 2 midnights of admission.   * I certify that at the point of admission it is my clinical judgment that the patient will require inpatient hospital care spanning beyond 2 midnights from the point of admission due to high intensity of service, high risk for further deterioration and high frequency of surveillance required.DEWAINE Lamar Dess MD [facility] If 7PM-7AM, please contact night-coverage [email or web address]  06/29/2024, 3:57 AM

## 2024-06-30 DIAGNOSIS — G9349 Other encephalopathy: Secondary | ICD-10-CM | POA: Diagnosis not present

## 2024-06-30 DIAGNOSIS — B999 Unspecified infectious disease: Secondary | ICD-10-CM | POA: Diagnosis not present

## 2024-06-30 MED ORDER — AMOXICILLIN-POT CLAVULANATE 875-125 MG PO TABS
1.0000 | ORAL_TABLET | Freq: Two times a day (BID) | ORAL | Status: DC
  Administered 2024-06-30 – 2024-07-04 (×9): 1 via ORAL
  Filled 2024-06-30 (×9): qty 1

## 2024-06-30 NOTE — Progress Notes (Signed)
 PROGRESS NOTE    Fred [name]  FMW:[id number] DOB: Jun 11, 1951 DOA: 06/28/2024 PCP: Pura Lenis, MD    Chief Complaint  Patient presents with   Fatigue    Brief Narrative:    Fred [name] is a 73 y.o. male with medical history significant of recurrent UTI, hypertension, cognitive delay who presents emergency department with a caretaker due to fatigue and altered mental status.  Patient is less aware and less responsive than usual.  Caregiver reports that he seemed altered and confused and not his usual self.  He presented to the ER where he was found to be afebrile and hemodynamically stable.  There was concern that he was urinating on himself.  Labs were [facility] which showed potassium 3.2, creatinine 1.15, lipase 21, WBC 5.0, hemoglobin 11.1, platelets 142, urinalysis negative for infection.  CT abdomen pelvis was obtained which showed left-sided pleural effusion and patchy ground glass opacities.  CT head showed no acute findings.  Patient was given ceftriaxone  and azithromycin  and admitted for further workup.  On evaluation he was unable to provide any history regarding his presentation.  No caretaker was at bedside.  He was resting comfortably.  Denied complaints.   Assessment & Plan:   Principal Problem:   Infectious encephalopathy   Acute infectious encephalopathy  bacterial community acquired pneumonia - Patient presented with changes in mental status - History of recurrent UTI - Urinalysis negative for infection -Continue ceftriaxone  and azithromycin  due to opacities seen on CT  - Has a presentation in August at which time he was found to have a pleural effusion.  Pulmonology was consulted at that time - Concern for aspiration, as sister reports cough while eating, SLP input appreciated, currently on dysphagia 2, thin liquid, and likely will need MBS this hospital stay     Chronic CHF -not in exacerbation-patient found to have a pericardial effusion and underwent drain  placement in July.  Started on colchicine .       Cognitive impairment/mood disorder-continue risperidone , Zoloft , benztropine    Hyperlipidemia-continue Lipitor   CAD-continue aspirin    BP-continue alfozosin   History of gout-continue colchicine     DVT prophylaxis: (Lovenox ) Code Status: (DNR) Family Communication: (D/W sister at bedside 11/1  Status is: Inpatient    Consultants:  none   Subjective:  Not provide reliable history, but no significant event as discussed with staff  Objective: Vitals:   06/30/24 0120 06/30/24 0300 06/30/24 0400 06/30/24 0800  BP: (!) 142/74  (!) 147/96 (!) 154/90  Pulse: 75 69 69 80  Resp: 15 12 15 14   Temp: (!) 97.3 F (36.3 C)  98.9 F (37.2 C) 97.9 F (36.6 C)  TempSrc: Axillary  Oral Oral  SpO2: 93% 97% 97% 96%  Weight:      Height:        Intake/Output Summary (Last 24 hours) at 06/30/2024 1153 Last data filed at 06/29/2024 1837 Gross per 24 hour  Intake --  Output 650 ml  Net -650 ml   Filed Weights   06/28/24 2104 06/29/24 0614  Weight: 58 kg 54.9 kg    Examination:  Awake, pleasant, extremely frail and deconditioned, chronic ill-appearing With her entry, with rales at the bases RRR +ve B.Sounds No Cyanosis, Clubbing or edema    Data Reviewed: I have personally reviewed following labs and imaging studies  CBC: Recent Labs  Lab 06/28/24 2152 06/29/24 0451  WBC 5.0 4.8  NEUTROABS 3.6  --   HGB 11.1* 12.5*  HCT 35.9* 40.0  MCV 90.0 89.1  PLT 142* 171    Basic Metabolic Panel: Recent Labs  Lab 06/28/24 2152 06/29/24 0451  NA 143 142  K 3.2* 4.1  CL 108 105  CO2 24 24  GLUCOSE 85 70  BUN 22 19  CREATININE 1.15 1.19  CALCIUM  8.1* 9.0    GFR: Estimated Creatinine Clearance: 42.3 mL/min (by C-G formula based on SCr of 1.19 mg/dL).  Liver Function Tests: Recent Labs  Lab 06/28/24 2152  AST 17  ALT 14  ALKPHOS 54  BILITOT 0.5  PROT 5.9*  ALBUMIN 2.7*    CBG: No results for  input(s): GLUCAP in the last 168 hours.   Recent Results (from the past 240 hours)  Body fluid culture w Gram Stain     Status: None   Collection Time: 06/20/24  2:16 PM   Specimen: Lung, Left; Pleural Fluid  Result Value Ref Range Status   Specimen Description PLEURAL  Final   Special Requests LEFT LUNG  Final   Gram Stain   Final    RARE WBC PRESENT,BOTH PMN AND MONONUCLEAR NO ORGANISMS SEEN    Culture   Final    NO GROWTH 3 DAYS Performed at [facility], [address] [place], [place] [address]    Report Status 06/23/2024 FINAL  Final  Urine Culture     Status: Abnormal (Preliminary result)   Collection Time: 06/29/24  1:29 AM   Specimen: Urine, Clean Catch  Result Value Ref Range Status   Specimen Description URINE, CLEAN CATCH  Final   Special Requests NONE  Final   Culture (A)  Final    >=100,000 COLONIES/mL ENTEROCOCCUS FAECALIS SUSCEPTIBILITIES TO FOLLOW Performed at [facility], [address] [place], [place] [address]    Report Status PENDING  Incomplete         Radiology Studies: CT ABDOMEN PELVIS W CONTRAST Result Date: 06/29/2024 EXAM: CT ABDOMEN AND PELVIS WITH CONTRAST 06/28/2024 11:41:16 PM TECHNIQUE: CT of the abdomen and pelvis was performed with the administration of 75 mL of intravenous iohexol  (OMNIPAQUE ) 350 MG/ML injection. Multiplanar reformatted images are provided for review. Automated exposure control, iterative reconstruction, and/or weight-based adjustment of the mA/kV was utilized to reduce the radiation dose to as low as reasonably achievable. COMPARISON: 04/15/2024 CLINICAL HISTORY: Abdominal pain, acute, nonlocalized. FINDINGS: LOWER CHEST: Moderate left pleural effusion. Patchy ground glass opacities in the left lower lobe could reflect atelectasis or infiltrate. The previously seen large right pleural effusion has resolved. Mild cardiomegaly. Aortic atherosclerosis. LIVER: The liver is unremarkable. GALLBLADDER AND BILE  DUCTS: Gallbladder is unremarkable. No biliary ductal dilatation. SPLEEN: No acute abnormality. PANCREAS: No acute abnormality. ADRENAL GLANDS: No acute abnormality. KIDNEYS, URETERS AND BLADDER: Numerous bilateral renal cysts appear simple and stable. Per consensus, no follow-up is needed for simple Bosniak type 1 and 2 renal cysts, unless the patient has a malignancy history or risk factors. No stones in the kidneys or ureters. No hydronephrosis. No perinephric or periureteral stranding. Urinary bladder is unremarkable. GI AND BOWEL: Stomach demonstrates no acute abnormality. Large stool burden throughout the colon. Normal appendix. There is no bowel obstruction. PERITONEUM AND RETROPERITONEUM: No ascites. No free air. VASCULATURE: Aorta is normal in caliber. Aortic atherosclerosis. LYMPH NODES: No lymphadenopathy. REPRODUCTIVE ORGANS: No acute abnormality. BONES AND SOFT TISSUES: Degenerative disc and facet disease in the lumbar spine. No focal soft tissue abnormality. IMPRESSION: 1. Moderate left pleural effusion with patchy left lower lobe ground-glass opacities, which may reflect atelectasis or infiltrate. 2. .  No acute findings in the abdomen or pelvis. 3. Aortic atherosclerosis. Electronically signed by: Franky Crease MD 06/29/2024 12:02 AM EDT RP Workstation: [id number]   CT Head Wo Contrast Result Date: 06/29/2024 CLINICAL DATA:  Altered mental status. EXAM: CT HEAD WITHOUT CONTRAST TECHNIQUE: Contiguous axial images were obtained from the base of the skull through the vertex without intravenous contrast. RADIATION DOSE REDUCTION: This exam was performed according to the departmental dose-optimization program which includes automated exposure control, adjustment of the mA and/or kV according to patient size and/or use of iterative reconstruction technique. COMPARISON:  Jan 03, 2024 FINDINGS: Brain: There is generalized cerebral atrophy with widening of the extra-axial spaces and ventricular dilatation.  There are areas of decreased attenuation within the white matter tracts of the supratentorial brain, consistent with microvascular disease changes. Vascular: Moderate to marked severity bilateral cavernous carotid artery calcification is noted. Skull: Normal. Negative for fracture or focal lesion. Sinuses/Orbits: No acute finding. Other: None. IMPRESSION: 1. Generalized cerebral atrophy and microvascular disease changes of the supratentorial brain. 2. No acute intracranial abnormality. Electronically Signed   By: Suzen Dials M.D.   On: 06/29/2024 00:01   DG Chest Portable 1 View Result Date: 06/28/2024 EXAM: 1 VIEW(S) XRAY OF THE CHEST 06/28/2024 11:21:54 PM COMPARISON: 06/20/2024 CLINICAL HISTORY: ams, tachypnea, assess for pleural effusions, hx of the same FINDINGS: LUNGS AND PLEURA: Increasing left lower lobe airspace opacity concerning for pneumonia. No pulmonary edema. No pleural effusion. No pneumothorax. HEART AND MEDIASTINUM: No acute abnormality of the cardiac and mediastinal silhouettes. BONES AND SOFT TISSUES: No acute osseous abnormality. IMPRESSION: 1. Increasing left lower lobe airspace opacity concerning for pneumonia. Electronically signed by: Kevin Dover MD 06/28/2024 11:31 PM EDT RP Workstation: [id number]        Scheduled Meds:  alfuzosin   10 mg Oral Daily   aspirin  EC  81 mg Oral Daily   atorvastatin   20 mg Oral Daily   benztropine   0.5 mg Oral Daily   colchicine   0.6 mg Oral Daily   enoxaparin  (LOVENOX ) injection  40 mg Subcutaneous Daily   [START ON 07/01/2024] ferrous sulfate   325 mg Oral Q M,W,F   risperiDONE   0.5 mg Oral Daily   risperiDONE   3 mg Oral QHS   sertraline   50 mg Oral Daily   Continuous Infusions:  azithromycin  500 mg (06/29/24 2348)   cefTRIAXone  (ROCEPHIN )  IV 1 g (06/29/24 2245)     LOS: 1 day      Brayton Lye, MD [facility]   To contact the attending provider between 7A-7P or the covering provider during after hours  7P-7A, please log into the web site [email or web address] and access using universal [place] password for that web site. If you do not have the password, please call the hospital operator.  06/30/2024, 11:53 AM

## 2024-06-30 NOTE — Evaluation (Signed)
 Occupational Therapy Evaluation Patient Details Name: Fred [name] MRN: [id number] DOB: [birth year] Today's Date: 06/30/2024   History of Present Illness   Pt is a 73 y.o. M who presents 06/28/2024 with fatigue and AMS. T abdomen pelvis was obtained which showed left-sided pleural effusion and patchy ground glass opacities.  CT head showed no acute findings.  Patient was given ceftriaxone  and azithromycin  and admitted for further workup. Significant PMH: recurrent UTI, hypertension, cognitive delay.     Clinical Impressions Pt has assist at baseline with ADLs and is typically ambulatory without AD. Pt from group home with 24/7 caregiver support. Pt currently needs up to mod A for ADLs, min A for bed mobility and min A for transfers with HHA. Pt with condom cath malfunction upon arrival, bed saturated. Pt able to ambulate around bed to chair and perform LB bathing task and UB dressing. Pt presenting with impairments listed below, will follow acutely. Anticipate no OT follow up needs at d/c given pt will have continued assist from PCA.     If plan is discharge home, recommend the following:   A little help with walking and/or transfers;A little help with bathing/dressing/bathroom;Assistance with cooking/housework;Direct supervision/assist for financial management;Direct supervision/assist for medications management;Assist for transportation;Help with stairs or ramp for entrance;Supervision due to cognitive status     Functional Status Assessment   Patient has had a recent decline in their functional status and demonstrates the ability to make significant improvements in function in a reasonable and predictable amount of time.     Equipment Recommendations   None recommended by OT     Recommendations for Other Services   PT consult     Precautions/Restrictions   Precautions Precautions: Fall Restrictions Weight Bearing Restrictions Per Provider Order: No     Mobility Bed  Mobility Overal bed mobility: Needs Assistance Bed Mobility: Supine to Sit     Supine to sit: Min assist          Transfers Overall transfer level: Needs assistance Equipment used: 1 person hand held assist Transfers: Sit to/from Stand Sit to Stand: Min assist                  Balance Overall balance assessment: Needs assistance Sitting-balance support: Feet supported Sitting balance-Leahy Scale: Fair     Standing balance support: Single extremity supported, During functional activity Standing balance-Leahy Scale: Poor                             ADL either performed or assessed with clinical judgement   ADL Overall ADL's : Needs assistance/impaired Eating/Feeding: Minimal assistance   Grooming: Minimal assistance   Upper Body Bathing: Moderate assistance   Lower Body Bathing: Moderate assistance   Upper Body Dressing : Moderate assistance   Lower Body Dressing: Moderate assistance   Toilet Transfer: Minimal assistance   Toileting- Clothing Manipulation and Hygiene: Minimal assistance       Functional mobility during ADLs: Minimal assistance       Vision   Vision Assessment?: No apparent visual deficits Additional Comments: not formally assessed     Perception Perception: Not tested       Praxis Praxis: Not tested       Pertinent Vitals/Pain Pain Assessment Pain Assessment: No/denies pain     Extremity/Trunk Assessment Upper Extremity Assessment Upper Extremity Assessment: Generalized weakness   Lower Extremity Assessment Lower Extremity Assessment: Defer to PT evaluation   Cervical / Trunk Assessment Cervical /  Trunk Assessment: Kyphotic   Communication Communication Communication: Impaired Factors Affecting Communication: Difficulty expressing self   Cognition Arousal: Alert Behavior During Therapy: WFL for tasks assessed/performed Cognition: History of cognitive impairments             OT - Cognition  Comments: hx of cognitive delay                 Following commands: Intact       Cueing  General Comments   Cueing Techniques: Verbal cues;Gestural cues  VSS on RA   Exercises     Shoulder Instructions      Home Living Family/patient expects to be discharged to:: Private residence Living Arrangements: Other relatives Available Help at Discharge: Family;Personal care attendant;Available 24 hours/day (PCA 24 hrs/day) Type of Home: House Home Access: Stairs to enter [employer] of Steps: 3 Entrance Stairs-Rails: Right;Left Home Layout: One level (3 steps into bathroom wtih R/L rails)     Bathroom Shower/Tub: Chief Strategy Officer: Standard     Home Equipment: Shower seat   Additional Comments: Family home licensed as a group home.      Prior Functioning/Environment Prior Level of Function : Needs assist             Mobility Comments: Day support program 3 days/wk, ambulates with no AD, and no falls ADLs Comments: assist from aide for ADL's    OT Problem List: Decreased strength;Decreased range of motion;Decreased activity tolerance;Impaired balance (sitting and/or standing);Decreased cognition;Decreased safety awareness   OT Treatment/Interventions: Self-care/ADL training;Therapeutic exercise;Energy conservation;DME and/or AE instruction;Therapeutic activities;Visual/perceptual remediation/compensation;Patient/family education;Balance training;Cognitive remediation/compensation      OT Goals(Current goals can be found in the care plan section)   Acute Rehab OT Goals Patient Stated Goal: none stated OT Goal Formulation: With patient Time For Goal Achievement: 07/14/24 Potential to Achieve Goals: Good ADL Goals Pt Will Perform Grooming: with set-up;standing Pt Will Perform Upper Body Dressing: with contact guard assist;with caregiver independent in assisting;sitting Pt Will Perform Lower Body Dressing: with contact guard  assist;with caregiver independent in assisting;sit to/from stand;sitting/lateral leans Pt Will Transfer to Toilet: with contact guard assist;ambulating;regular height toilet Pt Will Perform Tub/Shower Transfer: Tub transfer;Shower transfer;with contact guard assist;ambulating   OT Frequency:  Min 1X/week    Co-evaluation              AM-PAC OT 6 Clicks Daily Activity     Outcome Measure Help from another person eating meals?: A Little Help from another person taking care of personal grooming?: A Little Help from another person toileting, which includes using toliet, bedpan, or urinal?: A Lot Help from another person bathing (including washing, rinsing, drying)?: A Lot Help from another person to put on and taking off regular upper body clothing?: A Lot Help from another person to put on and taking off regular lower body clothing?: A Lot 6 Click Score: 14   End of Session Equipment Utilized During Treatment: Gait belt Nurse Communication: Mobility status;Other (comment) (condom cath off, needs to be replaced)  Activity Tolerance: Patient tolerated treatment well Patient left: in chair;with call bell/phone within reach;with chair alarm set;with nursing/sitter in room  OT Visit Diagnosis: Other abnormalities of gait and mobility (R26.89);Unsteadiness on feet (R26.81);Muscle weakness (generalized) (M62.81)                Time: 9052-8997 OT Time Calculation (min): 15 min Charges:  OT General Charges $OT Visit: 1 Visit OT Evaluation $OT Eval Low Complexity: 1 Low  Cylan Borum K,  OTD, OTR/L SecureChat Preferred Acute Rehab [phone]   Laneta MARLA Pereyra 06/30/2024, 12:19 PM

## 2024-06-30 NOTE — Plan of Care (Signed)
  Problem: Education: Goal: Knowledge of General Education information will improve Description: Including pain rating scale, medication(s)/side effects and non-pharmacologic comfort measures Outcome: Progressing   Problem: Clinical Measurements: Goal: Will remain free from infection Outcome: Progressing Goal: Diagnostic test results will improve Outcome: Progressing   Problem: Activity: Goal: Risk for activity intolerance will decrease Outcome: Progressing   Problem: Nutrition: Goal: Adequate nutrition will be maintained Outcome: Progressing

## 2024-06-30 NOTE — Evaluation (Signed)
 Clinical/Bedside Swallow Evaluation Patient Details  Name: Fred [name] MRN: [id number] Date of Birth: 07/21/1951  Today's Date: 06/30/2024 Time: SLP Start Time (ACUTE ONLY): 9178 SLP Stop Time (ACUTE ONLY): 0838 SLP Time Calculation (min) (ACUTE ONLY): 17 min  Past Medical History:  Past Medical History:  Diagnosis Date   Acute UTI (urinary tract infection) 01/01/2023   AKI (acute kidney injury) 11/02/2022   Diabetes mellitus    Hypertension    Mental retardation    Severe sepsis (HCC) 11/02/2022   Past Surgical History:  Past Surgical History:  Procedure Laterality Date   IR THORACENTESIS ASP PLEURAL SPACE W/IMG GUIDE  06/20/2024   PERICARDIOCENTESIS N/A 03/09/2024   Procedure: PERICARDIOCENTESIS;  Surgeon: Jordan, Peter M, MD;  Location: [facility] INVASIVE CV LAB;  Service: Cardiovascular;  Laterality: N/A;   HPI:  Pt is a 73 y.o. M who presents 06/28/2024 with fatigue and AMS. CT abdomen pelvis was obtained which showed left-sided pleural effusion and patchy ground glass opacities.  CT head showed no acute findings. Per MD note 11/1 pt's sister reports coughing with PO intake. Pt was seen in August 2025 at which time he was admitted with hypoxic respiratory failure and pleural effusions. No signs of aspiration were noted, but he was impulsive with solids and decision with sister was for more finely chopped foods. Significant PMH: recurrent UTI, hypertension, cognitive delay.    Assessment / Plan / Recommendation  Clinical Impression  Similarly to previous SLP evaluation earlier this year, pt has a tendency to be impulsive with self-feeding. He fills his mouth but still puts more in while he is chewing the previous bite. This time though it did result in occasional coughing. Once repositioned and given cues to assist with slowing pacing, no further overt signs of aspiration were noted. Will start by adjusting diet to Dys 2 (finely chopped) with thin liquids, but will also continue to follow  briefly given signs of possible aspiration and concern for PNA and persistent pleural effusions. Will consider MBS depending on clinical presentation on modified diet and family wishes as discussed with MD. Pt is likely to need supervision during meals for pacing.   SLP Visit Diagnosis: Dysphagia, unspecified (R13.10)    Aspiration Risk       Diet Recommendation Dysphagia 2 (Fine chop);Thin liquid    Liquid Administration via: Cup;Straw Medication Administration: Whole meds with liquid Supervision: Patient able to self feed;Full supervision/cueing for compensatory strategies Compensations: Minimize environmental distractions;Slow rate;Small sips/bites;Other (Comment) (swallow before taking another bite) Postural Changes: Seated upright at 90 degrees    Other  Recommendations Oral Care Recommendations: Oral care BID     Assistance Recommended at Discharge    Functional Status Assessment Patient has had a recent decline in their functional status and demonstrates the ability to make significant improvements in function in a reasonable and predictable amount of time.  Frequency and Duration min 2x/week  2 weeks       Prognosis Prognosis for improved oropharyngeal function: Fair Barriers to Reach Goals: Cognitive deficits;Time post onset      Swallow Study   General HPI: Pt is a 73 y.o. M who presents 06/28/2024 with fatigue and AMS. CT abdomen pelvis was obtained which showed left-sided pleural effusion and patchy ground glass opacities.  CT head showed no acute findings. Per MD note 11/1 pt's sister reports coughing with PO intake. Pt was seen in August 2025 at which time he was admitted with hypoxic respiratory failure and pleural effusions. No signs of aspiration  were noted, but he was impulsive with solids and decision with sister was for more finely chopped foods. Significant PMH: recurrent UTI, hypertension, cognitive delay. Type of Study: Bedside Swallow Evaluation Previous  Swallow Assessment: see HPI Diet Prior to this Study: Regular;Thin liquids (Level 0) Temperature Spikes Noted: No Respiratory Status: Room air History of Recent Intubation: No Behavior/Cognition: Alert;Cooperative;Requires cueing Oral Care Completed by SLP: No Vision: Functional for self-feeding Self-Feeding Abilities: Able to feed self Patient Positioning: Upright in bed Baseline Vocal Quality: Normal    Oral/Motor/Sensory Function Overall Oral Motor/Sensory Function:  (not following commands to assess directly)   Ice Chips Ice chips: Not tested   Thin Liquid Thin Liquid: Within functional limits Presentation: Self Fed;Straw    Nectar Thick Nectar Thick Liquid: Not tested   Honey Thick Honey Thick Liquid: Not tested   Puree Puree: Impaired Presentation: Self Fed;Spoon Oral Phase Impairments: Poor awareness of bolus Oral Phase Functional Implications: Other (comment) (anterior loss)   Solid     Solid: Impaired Presentation: Self Fed Oral Phase Impairments: Poor awareness of bolus Oral Phase Functional Implications: Prolonged oral transit;Oral residue Pharyngeal Phase Impairments: Cough - Immediate      Leita SAILOR., M.A. CCC-SLP Acute Rehabilitation Services Office: [phone]  Secure chat preferred  06/30/2024,9:09 AM

## 2024-07-01 ENCOUNTER — Ambulatory Visit: Admitting: Podiatry

## 2024-07-01 DIAGNOSIS — B999 Unspecified infectious disease: Secondary | ICD-10-CM | POA: Diagnosis not present

## 2024-07-01 DIAGNOSIS — G9349 Other encephalopathy: Secondary | ICD-10-CM | POA: Diagnosis not present

## 2024-07-01 LAB — CBC
HCT: 35.9 % — ABNORMAL LOW (ref 39.0–52.0)
Hemoglobin: 11.4 g/dL — ABNORMAL LOW (ref 13.0–17.0)
MCH: 27.9 pg (ref 26.0–34.0)
MCHC: 31.8 g/dL (ref 30.0–36.0)
MCV: 87.8 fL (ref 80.0–100.0)
Platelets: 129 K/uL — ABNORMAL LOW (ref 150–400)
RBC: 4.09 MIL/uL — ABNORMAL LOW (ref 4.22–5.81)
RDW: 16.8 % — ABNORMAL HIGH (ref 11.5–15.5)
WBC: 4 K/uL (ref 4.0–10.5)
nRBC: 0 % (ref 0.0–0.2)

## 2024-07-01 LAB — BASIC METABOLIC PANEL WITH GFR
Anion gap: 11 (ref 5–15)
BUN: 19 mg/dL (ref 8–23)
CO2: 25 mmol/L (ref 22–32)
Calcium: 8.6 mg/dL — ABNORMAL LOW (ref 8.9–10.3)
Chloride: 105 mmol/L (ref 98–111)
Creatinine, Ser: 1.34 mg/dL — ABNORMAL HIGH (ref 0.61–1.24)
GFR, Estimated: 56 mL/min — ABNORMAL LOW (ref >60)
Glucose, Bld: 101 mg/dL — ABNORMAL HIGH (ref 70–99)
Potassium: 3.6 mmol/L (ref 3.5–5.1)
Sodium: 141 mmol/L (ref 135–145)

## 2024-07-01 LAB — MAGNESIUM: Magnesium: 1.8 mg/dL (ref 1.7–2.4)

## 2024-07-01 LAB — URINE CULTURE: Culture: >=100000 — AB

## 2024-07-01 LAB — PHOSPHORUS: Phosphorus: 3.6 mg/dL (ref 2.5–4.6)

## 2024-07-01 NOTE — Progress Notes (Addendum)
   07/01/24 1030  Mobility  Activity Ambulated with assistance  Level of Assistance Minimal assist, patient does 75% or more  Assistive Device Other (Comment) (HHA)  Distance Ambulated (ft) 20 ft  Activity Response Tolerated fair  Mobility Referral Yes  Mobility visit 1 Mobility  Mobility Specialist Start Time (ACUTE ONLY) 1030  Mobility Specialist Stop Time (ACUTE ONLY) 1057  Mobility Specialist Time Calculation (min) (ACUTE ONLY) 27 min   Mobility Specialist: Progress Note  During Mobility:  BP 157/97 (114)  Pt agreeable to mobility session - received in bed. C/o dizziness -  RN aware. Returned to chair with all needs met - call bell within reach. Left with RN present.   Additional comments: STS x4 from bed, STS x1 from toilet. Pt with large amounts of loose stool, ambulated to BR, required assistance with pericare.   Pt seen for additional visit. Ambulated longer distance. Pt was asymptomatic throughout session with no complaints. Requesting to go back to bed. Left with sister present.    Virgle Boards, BS Mobility Specialist Please contact via SecureChat or  Rehab office at [phone].

## 2024-07-01 NOTE — Progress Notes (Signed)
 Speech Language Pathology Treatment: Dysphagia  Patient Details Name: Fred [name] MRN: [id number] DOB: March 06, 1951 Today's Date: 07/01/2024 Time: 8383-8373 SLP Time Calculation (min) (ACUTE ONLY): 10 min  Assessment / Plan / Recommendation Clinical Impression  Observed pt with his meal tray with assistance provided by his sister. She reports that while she supervises him closely with PO intake, this level of supervision is not always consistent when she is away for the day. Pt takes large bites, often adding more food to his mouth before swallowing the previous bite. Mastication is prolonged and multiple liquid washes were needed to clear his oral cavity completely. Min cueing prevents him from over-filling his mouth. Coughing intermittently follows bites and sips. Provided education regarding aspiration precautions and options for instrumental testing. Pt's sister eager to proceed with an MBS for further assessment. Will f/u as scheduling allows but recommend continuing current diet in the interim.    HPI HPI: Pt is a 73 y.o. M who presents 06/28/2024 with fatigue and AMS. CT abdomen pelvis was obtained which showed left-sided pleural effusion and patchy ground glass opacities.  CT head showed no acute findings. Per MD note 11/1 pt's sister reports coughing with PO intake. Pt was seen in August 2025 at which time he was admitted with hypoxic respiratory failure and pleural effusions. No signs of aspiration were noted, but he was impulsive with solids and decision with sister was for more finely chopped foods. Significant PMH: recurrent UTI, hypertension, cognitive delay.      SLP Plan  MBS          Recommendations  Diet recommendations: Dysphagia 2 (fine chop);Thin liquid Liquids provided via: Cup;Straw Medication Administration: Whole meds with liquid Supervision: Staff to assist with self feeding;Full supervision/cueing for compensatory strategies;Trained caregiver to feed  patient Compensations: Minimize environmental distractions;Slow rate;Small sips/bites;Other (Comment) (swallow before taking another bite) Postural Changes and/or Swallow Maneuvers: Seated upright 90 degrees                  Oral care BID   Frequent or constant Supervision/Assistance Dysphagia, unspecified (R13.10)     MBS     Damien Blumenthal, M.A., CCC-SLP Speech Language Pathology, Acute Rehabilitation Services  Secure Chat preferred [phone]   07/01/2024, 5:10 PM

## 2024-07-01 NOTE — Progress Notes (Signed)
 PROGRESS NOTE    Fred [name]  FMW:[id number] DOB: 05-Feb-1951 DOA: 06/28/2024 PCP: Pura Lenis, MD    Chief Complaint  Patient presents with   Fatigue    Brief Narrative:    Fred [name] is a 73 y.o. male with medical history significant of recurrent UTI, hypertension, cognitive delay who presents emergency department with a caretaker due to fatigue and altered mental status.  Patient is less aware and less responsive than usual.  Caregiver reports that he seemed altered and confused and not his usual self.  He presented to the ER where he was found to be afebrile and hemodynamically stable.  There was concern that he was urinating on himself.  Labs were [facility] which showed potassium 3.2, creatinine 1.15, lipase 21, WBC 5.0, hemoglobin 11.1, platelets 142, urinalysis negative for infection.  CT abdomen pelvis was obtained which showed left-sided pleural effusion and patchy ground glass opacities.  CT head showed no acute findings.  Patient was given ceftriaxone  and azithromycin  and admitted for further workup.  On evaluation he was unable to provide any history regarding his presentation.  No caretaker was at bedside.  He was resting comfortably.  Denied complaints.   Assessment & Plan:   Principal Problem:   Infectious encephalopathy   Acute infectious encephalopathy  bacterial community acquired pneumonia - Patient presented with changes in mental status - History of recurrent UTI -Continue ceftriaxone  and azithromycin  due to opacities seen on CT, transition to Augmentin   - Has a presentation in August at which time he was found to have a pleural effusion.  Pulmonology was consulted at that time - Concern for aspiration, as sister reports cough while eating, SLP input appreciated, currently on dysphagia 2, thin liquid, will be followed by SLP, may need MBS.  SABRA   UTI - Urinalysis negative for infection, but urine culture growing Enterococcus faecalis, currently on Augmentin     Chronic CHF -not in exacerbation-patient found to have a pericardial effusion and underwent drain placement in July.  Started on colchicine .   AKI - Creatinine up to 1.34 this morning , bladder scan with 300 cc, encouraged to ambulate, will monitor closely to avoid Foley catheter insertion, will give gentle hydration.   Cognitive impairment/mood disorder-continue risperidone , Zoloft , benztropine    Hyperlipidemia-continue Lipitor   CAD-continue aspirin    BP-continue alfozosin   History of gout-continue colchicine     DVT prophylaxis: (Lovenox ) Code Status: (DNR) Family Communication: (None at bedside  Status is: Inpatient    Consultants:  none   Subjective:  No significant events overnight as discussed with staff, bladder scan showing 300 cc this morning.   Objective: Vitals:   06/30/24 2016 07/01/24 0000 07/01/24 0400 07/01/24 0801  BP: 139/75 130/75 126/73 (!) 148/84  Pulse: 78 74 75   Resp: 18 14 15    Temp: (!) 97.3 F (36.3 C) (!) 97.5 F (36.4 C) 97.6 F (36.4 C) 97.6 F (36.4 C)  TempSrc: Axillary Axillary Axillary Axillary  SpO2: 98% 97% 96%   Weight:      Height:        Intake/Output Summary (Last 24 hours) at 07/01/2024 0955 Last data filed at 07/01/2024 0803 Gross per 24 hour  Intake 120 ml  Output 800 ml  Net -680 ml   Filed Weights   06/28/24 2104 06/29/24 0614  Weight: 58 kg 54.9 kg    Examination:   Awake, pleasant, no apparent distress, extremely frail, impaired judgment and insight Decreased air entry at the bases, left> right RRR,No Gallops,Rubs or  new Murmurs, No Parasternal Heave +ve B.Sounds, Abd Soft, No tenderness, No rebound - guarding or rigidity. No Cyanosis, Clubbing or edema, No new Rash or bruise      Data Reviewed: I have personally reviewed following labs and imaging studies  CBC: Recent Labs  Lab 06/28/24 2152 06/29/24 0451 07/01/24 0423  WBC 5.0 4.8 4.0  NEUTROABS 3.6  --   --   HGB 11.1* 12.5* 11.4*  HCT  35.9* 40.0 35.9*  MCV 90.0 89.1 87.8  PLT 142* 171 129*    Basic Metabolic Panel: Recent Labs  Lab 06/28/24 2152 06/29/24 0451 07/01/24 0423  NA 143 142 141  K 3.2* 4.1 3.6  CL 108 105 105  CO2 24 24 25   GLUCOSE 85 70 101*  BUN 22 19 19   CREATININE 1.15 1.19 1.34*  CALCIUM  8.1* 9.0 8.6*  MG  --   --  1.8  PHOS  --   --  3.6    GFR: Estimated Creatinine Clearance: 37.6 mL/min (A) (by C-G formula based on SCr of 1.34 mg/dL (H)).  Liver Function Tests: Recent Labs  Lab 06/28/24 2152  AST 17  ALT 14  ALKPHOS 54  BILITOT 0.5  PROT 5.9*  ALBUMIN 2.7*    CBG: No results for input(s): GLUCAP in the last 168 hours.   Recent Results (from the past 240 hours)  Urine Culture     Status: Abnormal   Collection Time: 06/29/24  1:29 AM   Specimen: Urine, Clean Catch  Result Value Ref Range Status   Specimen Description URINE, CLEAN CATCH  Final   Special Requests   Final    NONE Performed at [facility], [address], [place], [place] [address]    Culture >=100,000 COLONIES/mL ENTEROCOCCUS FAECALIS (A)  Final   Report Status 07/01/2024 FINAL  Final   Organism ID, Bacteria ENTEROCOCCUS FAECALIS (A)  Final      Susceptibility   Enterococcus faecalis - MIC*    AMPICILLIN <=2 SENSITIVE Sensitive     NITROFURANTOIN <=16 SENSITIVE Sensitive     VANCOMYCIN  1 SENSITIVE Sensitive     * >=100,000 COLONIES/mL ENTEROCOCCUS FAECALIS         Radiology Studies: No results found.       Scheduled Meds:  alfuzosin   10 mg Oral Daily   amoxicillin -clavulanate  1 tablet Oral Q12H   aspirin  EC  81 mg Oral Daily   atorvastatin   20 mg Oral Daily   benztropine   0.5 mg Oral Daily   colchicine   0.6 mg Oral Daily   enoxaparin  (LOVENOX ) injection  40 mg Subcutaneous Daily   ferrous sulfate   325 mg Oral Q M,W,F   risperiDONE   0.5 mg Oral Daily   risperiDONE   3 mg Oral QHS   sertraline   50 mg Oral Daily   Continuous Infusions:  azithromycin  500 mg (06/30/24 2151)      LOS: 2 days      Brayton Lye, MD [facility]   To contact the attending provider between 7A-7P or the covering provider during after hours 7P-7A, please log into the web site [email or web address] and access using universal [place] password for that web site. If you do not have the password, please call the hospital operator.  07/01/2024, 9:55 AM

## 2024-07-01 NOTE — Plan of Care (Signed)

## 2024-07-02 ENCOUNTER — Inpatient Hospital Stay (HOSPITAL_COMMUNITY)

## 2024-07-02 DIAGNOSIS — G9349 Other encephalopathy: Secondary | ICD-10-CM | POA: Diagnosis not present

## 2024-07-02 DIAGNOSIS — B999 Unspecified infectious disease: Secondary | ICD-10-CM | POA: Diagnosis not present

## 2024-07-02 LAB — BASIC METABOLIC PANEL WITH GFR
Anion gap: 11 (ref 5–15)
BUN: 16 mg/dL (ref 8–23)
CO2: 28 mmol/L (ref 22–32)
Calcium: 8.6 mg/dL — ABNORMAL LOW (ref 8.9–10.3)
Chloride: 104 mmol/L (ref 98–111)
Creatinine, Ser: 1.24 mg/dL (ref 0.61–1.24)
GFR, Estimated: >60 mL/min (ref >60)
Glucose, Bld: 112 mg/dL — ABNORMAL HIGH (ref 70–99)
Potassium: 3.5 mmol/L (ref 3.5–5.1)
Sodium: 143 mmol/L (ref 135–145)

## 2024-07-02 LAB — CBC
HCT: 39.4 % (ref 39.0–52.0)
Hemoglobin: 12.1 g/dL — ABNORMAL LOW (ref 13.0–17.0)
MCH: 27.8 pg (ref 26.0–34.0)
MCHC: 30.7 g/dL (ref 30.0–36.0)
MCV: 90.4 fL (ref 80.0–100.0)
Platelets: 153 K/uL (ref 150–400)
RBC: 4.36 MIL/uL (ref 4.22–5.81)
RDW: 17 % — ABNORMAL HIGH (ref 11.5–15.5)
WBC: 6.6 K/uL (ref 4.0–10.5)
nRBC: 0 % (ref 0.0–0.2)

## 2024-07-02 LAB — MAGNESIUM: Magnesium: 1.8 mg/dL (ref 1.7–2.4)

## 2024-07-02 LAB — PHOSPHORUS: Phosphorus: 2.8 mg/dL (ref 2.5–4.6)

## 2024-07-02 NOTE — Plan of Care (Signed)

## 2024-07-02 NOTE — Care Management Important Message (Signed)
 Important Message  Patient Details  Name: Fred [name] MRN: [id number] Date of Birth: [birth year]   Important Message Given:  Yes - Medicare IM     [name] 07/02/2024, 3:03 PM

## 2024-07-02 NOTE — Care Management Important Message (Signed)
 Important Message  Patient Details  Name: Fred [name] MRN: [id number] Date of Birth: 23-Jul-1951   Important Message Given:  Yes - Medicare IM     [name] 07/02/2024, 3:09 PM

## 2024-07-02 NOTE — Progress Notes (Signed)
 Modified Barium Swallow Study  Patient Details  Name: Fred [name] MRN: [id number] Date of Birth: [birth year]  Today's Date: 07/02/2024  Modified Barium Swallow completed.  Full report located under Chart Review in the Imaging Section.  History of Present Illness Pt is a 73 y.o. M who presents 06/28/2024 with fatigue and AMS. CT abdomen pelvis was obtained which showed left-sided pleural effusion and patchy ground glass opacities.  CT head showed no acute findings. MRI Cervical Spine 2021 with prominent degenerative endplate changes at C3-4 including bulky anterior vertebral spurring with prominent anterior spurring also noted C4-5 and C5-6. Per MD note 11/1 pt's sister reports coughing with PO intake. Pt was seen in August 2025 at which time he was admitted with hypoxic respiratory failure and pleural effusions. No signs of aspiration were noted, but he was impulsive with solids and decision with sister was for more finely chopped foods. Significant PMH: recurrent UTI, hypertension, cognitive delay.   Clinical Impression Pt exhibits severe oropharyngeal dysphagia (DIGEST score of 3 primarily due to the chronicity and volume of aspiration) characterized by absent epiglottic deflection, incomplete laryngeal closure, and reduced pharyngeal peristalsis. Most recent cervical spine imaging 2021 confirms bulky anterior vertebral spurring of C3-4, C4-5, and C5-6, which limits epiglottic inversion and also affects pharyngeal clearance and UES distension. All liquids are silently aspirated, most notably after the swallow and are only variably sensed (PAS 7, 8). Vallecular residuals of pureed solids also progressed over the epiglottis and into the laryngeal vestibule, though remained above the vocal folds (PAS 3). It is difficult to replicate the rate he uses during meals and although mastication is thorough, he is unable to clear his oral cavity of diffuse palatal residue, which did not clear with a liquid  wash. Cognitive deficits limit his ability to use compensatory strategies, including a volitional cough. Pt's presentation is suspected to represent chronic dysphagia and it is recommended that he continue current diet using aspiration precautions. SLP will f/u for ongoing education.  Factors that may increase risk of adverse event in presence of aspiration [facility] L[facility] 2021): Reduced cognitive function;Limited mobility;Dependence for feeding and/or oral hygiene;Weak cough;Aspiration of thick, dense, and/or acidic materials;Frequent aspiration of large volumes  Swallow Evaluation Recommendations Recommendations: PO diet PO Diet Recommendation: Dysphagia 2 (Finely chopped);Thin liquids (Level 0) Liquid Administration via: Cup;Straw Medication Administration: Crushed with puree Supervision: Full assist for feeding;Full supervision/cueing for swallowing strategies Swallowing strategies  : Minimize environmental distractions;Slow rate;Small bites/sips Postural changes: Position pt fully upright for meals;Stay upright 30-60 min after meals Oral care recommendations: Oral care BID (2x/day);Oral care before PO;Staff/trained caregiver to provide oral care    Damien Blumenthal, M.A., CCC-SLP Speech Language Pathology, Acute Rehabilitation Services  Secure Chat preferred [phone]  07/02/2024,11:11 AM

## 2024-07-02 NOTE — Progress Notes (Signed)
 PROGRESS NOTE    Fred [name]  FMW:[id number] DOB: April 12, 1951 DOA: 06/28/2024 PCP: Pura Lenis, MD    Chief Complaint  Patient presents with   Fatigue    Brief Narrative:    Fred [name] is a 73 y.o. male with medical history significant of recurrent UTI, hypertension, cognitive delay who presents emergency department with a caretaker due to fatigue and altered mental status.  Patient is less aware and less responsive than usual.  Recent hospitalization for pneumonia, recurrent pleural effusion, doing with pulmonary as an outpatient, most recent thoracentesis 10/23.  His workup significant for recurrent pneumonia, as well urine culture growing Enterococcus faecalis  Assessment & Plan:   Principal Problem:   Infectious encephalopathy   Acute infectious encephalopathy  bacterial community acquired pneumonia - Patient presented with changes in mental status - History of recurrent UTI -Continue ceftriaxone  and azithromycin  due to opacities seen on CT, transitioned to Augmentin   - Has a presentation in August at which time he was found to have a pleural effusion.  Pulmonology was consulted at that time - Concern for aspiration, as sister reports cough while eating, SLP input appreciated, currently on dysphagia 2, thin liquid, will be followed by SLP, likely plan for MBS today.  UTI - Urinalysis negative for infection, but urine culture growing Enterococcus faecalis, currently on Augmentin    Chronic CHF -not in exacerbation-patient found to have a pericardial effusion and underwent drain placement in July.  Started on colchicine .   AKI - Creatinine up to 1.34 this morning , bladder scan with 300 cc, encouraged to ambulate, will monitor closely to avoid Foley catheter insertion, will give gentle hydration.  Cognitive impairment/mood disorder -continue risperidone , Zoloft , benztropine    Hyperlipidemia -continue Lipitor   CAD -continue aspirin    BP -continue alfozosin    History of gout -continue colchicine     DVT prophylaxis: (Lovenox ) Code Status: (DNR) Family Communication: (None at bedside today, discussed with sister at bedside 11/1, left voicemail 11/4)  Status is: Inpatient    Consultants:  none   Subjective:  No significant events overnight, no evidence of urinary retention Objective: Vitals:   07/01/24 0801 07/01/24 1206 07/01/24 1932 07/02/24 0833  BP: (!) 148/84  (!) 165/98 (!) 145/95  Pulse:  71 97 81  Resp:  16 17   Temp: 97.6 F (36.4 C) 98.1 F (36.7 C)  (!) 97.4 F (36.3 C)  TempSrc: Axillary Axillary  Oral  SpO2:      Weight:      Height:        Intake/Output Summary (Last 24 hours) at 07/02/2024 0929 Last data filed at 07/01/2024 2204 Gross per 24 hour  Intake 684.1 ml  Output 800 ml  Net -115.9 ml   Filed Weights   06/28/24 2104 06/29/24 0614  Weight: 58 kg 54.9 kg    Examination:   Awake, pleasant, no apparent distress, extremely frail, impaired judgment and insight Decreased air entry at the bases, left> right RRR,No Gallops,Rubs or new Murmurs +ve B.Sounds, Abd Soft No Cyanosis, Clubbing or edema     Data Reviewed: I have personally reviewed following labs and imaging studies  CBC: Recent Labs  Lab 06/28/24 2152 06/29/24 0451 07/01/24 0423 07/02/24 0418  WBC 5.0 4.8 4.0 6.6  NEUTROABS 3.6  --   --   --   HGB 11.1* 12.5* 11.4* 12.1*  HCT 35.9* 40.0 35.9* 39.4  MCV 90.0 89.1 87.8 90.4  PLT 142* 171 129* 153    Basic Metabolic Panel: Recent Labs  Lab 06/28/24 2152 06/29/24 0451 07/01/24 0423 07/02/24 0418  NA 143 142 141 143  K 3.2* 4.1 3.6 3.5  CL 108 105 105 104  CO2 24 24 25 28   GLUCOSE 85 70 101* 112*  BUN 22 19 19 16   CREATININE 1.15 1.19 1.34* 1.24  CALCIUM  8.1* 9.0 8.6* 8.6*  MG  --   --  1.8 1.8  PHOS  --   --  3.6 2.8    GFR: Estimated Creatinine Clearance: 40.6 mL/min (by C-G formula based on SCr of 1.24 mg/dL).  Liver Function Tests: Recent Labs  Lab  06/28/24 2152  AST 17  ALT 14  ALKPHOS 54  BILITOT 0.5  PROT 5.9*  ALBUMIN 2.7*    CBG: No results for input(s): GLUCAP in the last 168 hours.   Recent Results (from the past 240 hours)  Urine Culture     Status: Abnormal   Collection Time: 06/29/24  1:29 AM   Specimen: Urine, Clean Catch  Result Value Ref Range Status   Specimen Description URINE, CLEAN CATCH  Final   Special Requests   Final    NONE Performed at [facility], [address], [place], [place] [address]    Culture >=100,000 COLONIES/mL ENTEROCOCCUS FAECALIS (A)  Final   Report Status 07/01/2024 FINAL  Final   Organism ID, Bacteria ENTEROCOCCUS FAECALIS (A)  Final      Susceptibility   Enterococcus faecalis - MIC*    AMPICILLIN <=2 SENSITIVE Sensitive     NITROFURANTOIN <=16 SENSITIVE Sensitive     VANCOMYCIN  1 SENSITIVE Sensitive     * >=100,000 COLONIES/mL ENTEROCOCCUS FAECALIS         Radiology Studies: No results found.       Scheduled Meds:  alfuzosin   10 mg Oral Daily   amoxicillin -clavulanate  1 tablet Oral Q12H   aspirin  EC  81 mg Oral Daily   atorvastatin   20 mg Oral Daily   benztropine   0.5 mg Oral Daily   colchicine   0.6 mg Oral Daily   enoxaparin  (LOVENOX ) injection  40 mg Subcutaneous Daily   ferrous sulfate   325 mg Oral Q M,W,F   risperiDONE   0.5 mg Oral Daily   risperiDONE   3 mg Oral QHS   sertraline   50 mg Oral Daily   Continuous Infusions:     LOS: 3 days      Brayton Lye, MD [facility]   To contact the attending provider between 7A-7P or the covering provider during after hours 7P-7A, please log into the web site [email or web address] and access using universal [place] password for that web site. If you do not have the password, please call the hospital operator.  07/02/2024, 9:29 AM

## 2024-07-02 NOTE — Progress Notes (Signed)
   07/02/24 1510  Mobility  Activity Ambulated with assistance  Level of Assistance Minimal assist, patient does 75% or more  Assistive Device Other (Comment) (HHA)  Distance Ambulated (ft) 250 ft  Activity Response Tolerated fair  Mobility Referral Yes  Mobility visit 1 Mobility  Mobility Specialist Start Time (ACUTE ONLY) 1510  Mobility Specialist Stop Time (ACUTE ONLY) 1522  Mobility Specialist Time Calculation (min) (ACUTE ONLY) 12 min   Mobility Specialist: Progress Note  Pt agreeable to mobility session - received in chair. Pt was asymptomatic throughout session with no complaints. Returned to chair with all needs met - call bell within reach. Chair alarm on. Sister present.   Additional comments:  Virgle Boards, BS Mobility Specialist Please contact via SecureChat or  Rehab office at [phone].

## 2024-07-02 NOTE — Progress Notes (Signed)
 Speech Language Pathology Treatment: Dysphagia  Patient Details Name: Fred [name] MRN: [id number] DOB: Feb 16, 1951 Today's Date: 07/02/2024 Time: 8553-8498 SLP Time Calculation (min) (ACUTE ONLY): 15 min  Assessment / Plan / Recommendation Clinical Impression  Returned to pt's room to discuss results of MBS with his sister. Reviewed images with recommendations to continue current diet given aspiration of all liquids. Based on pt's acute presentation, multiple admissions for pneumonia, and his sister's report of his baseline, dysphagia is suspected to be chronic in nature. She agrees that it is unlikely that he would consistently be able to use compensatory strategies. Discussed the factors that can be controlled in this situation including oral hygiene and positioning for PO intake. Although aspiration was primarily silent on the MBS, he was observed to cough repeatedly after controlled bites of a milkshake. Discussed that coughing is likely a sign of aspiration, though aspiration may also be occurring even when he is not coughing. Recommend he continue Dys 2 solids with thin liquids, with full supervision for pacing and to ensure he is sitting fully upright during and after meal times. Provide oral care BID and before PO intake. She demonstrates understanding of all provided education. SLP will f/u at least briefly.    HPI HPI: Pt is a 73 y.o. M who presents 06/28/2024 with fatigue and AMS. CT abdomen pelvis was obtained which showed left-sided pleural effusion and patchy ground glass opacities.  CT head showed no acute findings. MRI Cervical Spine 2021 with prominent degenerative endplate changes at C3-4 including bulky anterior vertebral spurring with prominent anterior spurring also noted C4-5 and C5-6. Per MD note 11/1 pt's sister reports coughing with PO intake. Pt was seen in August 2025 at which time he was admitted with hypoxic respiratory failure and pleural effusions. No signs of aspiration  were noted, but he was impulsive with solids and decision with sister was for more finely chopped foods. Significant PMH: recurrent UTI, hypertension, cognitive delay.      SLP Plan  Continue with current plan of care          Recommendations  Diet recommendations: Dysphagia 2 (fine chop);Thin liquid Liquids provided via: Cup;Straw Medication Administration: Crushed with puree Supervision: Staff to assist with self feeding;Full supervision/cueing for compensatory strategies;Trained caregiver to feed patient Compensations: Minimize environmental distractions;Slow rate;Small sips/bites;Other (Comment) (swallow before taking another bite) Postural Changes and/or Swallow Maneuvers: Seated upright 90 degrees;Upright 30-60 min after meal                  Oral care BID;Oral care before and after PO;Staff/trained caregiver to provide oral care   Frequent or constant Supervision/Assistance Dysphagia, oropharyngeal phase (R13.12)     Continue with current plan of care     Damien Blumenthal, M.A., CCC-SLP Speech Language Pathology, Acute Rehabilitation Services  Secure Chat preferred [phone]   07/02/2024, 3:23 PM

## 2024-07-03 ENCOUNTER — Ambulatory Visit: Payer: Self-pay | Admitting: Internal Medicine

## 2024-07-03 DIAGNOSIS — N400 Enlarged prostate without lower urinary tract symptoms: Secondary | ICD-10-CM | POA: Diagnosis not present

## 2024-07-03 DIAGNOSIS — J189 Pneumonia, unspecified organism: Secondary | ICD-10-CM

## 2024-07-03 DIAGNOSIS — N390 Urinary tract infection, site not specified: Secondary | ICD-10-CM

## 2024-07-03 DIAGNOSIS — G9349 Other encephalopathy: Secondary | ICD-10-CM | POA: Diagnosis not present

## 2024-07-03 LAB — BASIC METABOLIC PANEL WITH GFR
Anion gap: 11 (ref 5–15)
BUN: 20 mg/dL (ref 8–23)
CO2: 27 mmol/L (ref 22–32)
Calcium: 8.5 mg/dL — ABNORMAL LOW (ref 8.9–10.3)
Chloride: 103 mmol/L (ref 98–111)
Creatinine, Ser: 1.22 mg/dL (ref 0.61–1.24)
GFR, Estimated: >60 mL/min (ref >60)
Glucose, Bld: 125 mg/dL — ABNORMAL HIGH (ref 70–99)
Potassium: 3.6 mmol/L (ref 3.5–5.1)
Sodium: 141 mmol/L (ref 135–145)

## 2024-07-03 LAB — MAGNESIUM: Magnesium: 2.1 mg/dL (ref 1.7–2.4)

## 2024-07-03 LAB — CBC
HCT: 38.1 % — ABNORMAL LOW (ref 39.0–52.0)
Hemoglobin: 11.6 g/dL — ABNORMAL LOW (ref 13.0–17.0)
MCH: 27.3 pg (ref 26.0–34.0)
MCHC: 30.4 g/dL (ref 30.0–36.0)
MCV: 89.6 fL (ref 80.0–100.0)
Platelets: 154 K/uL (ref 150–400)
RBC: 4.25 MIL/uL (ref 4.22–5.81)
RDW: 16.8 % — ABNORMAL HIGH (ref 11.5–15.5)
WBC: 5 K/uL (ref 4.0–10.5)
nRBC: 0 % (ref 0.0–0.2)

## 2024-07-03 LAB — PHOSPHORUS: Phosphorus: 3.6 mg/dL (ref 2.5–4.6)

## 2024-07-03 NOTE — Progress Notes (Signed)
   07/03/24 1528  Mobility  Activity Ambulated with assistance  Level of Assistance Minimal assist, patient does 75% or more  Assistive Device Other (Comment) (HHA)  Distance Ambulated (ft) 100 ft  Activity Response Tolerated well  Mobility Referral Yes  Mobility visit 1 Mobility  Mobility Specialist Start Time (ACUTE ONLY) 1528  Mobility Specialist Stop Time (ACUTE ONLY) 1535  Mobility Specialist Time Calculation (min) (ACUTE ONLY) 7 min   Mobility Specialist: Progress Note  Pt agreeable to mobility session - received in chair. Pt was asymptomatic throughout session with no complaints. Returned to ambulating in hallway with PT with all needs met - call bell within reach.   Additional comments: Sister present.   Virgle Boards, BS Mobility Specialist Please contact via SecureChat or  Rehab office at [phone].

## 2024-07-03 NOTE — Progress Notes (Signed)
   07/03/24 1126  Mobility  Activity Pivoted/transferred from bed to chair  Level of Assistance Minimal assist, patient does 75% or more  Assistive Device Other (Comment) (HHA)  Activity Response Tolerated fair  Mobility Referral Yes  Mobility visit 1 Mobility  Mobility Specialist Start Time (ACUTE ONLY) 1126  Mobility Specialist Stop Time (ACUTE ONLY) 1133  Mobility Specialist Time Calculation (min) (ACUTE ONLY) 7 min   Mobility Specialist: Progress Note  Pt agreeable to mobility session - received in sitting up in bed. Pt was asymptomatic throughout session with no complaints. Returned to chair eating lunch with all needs met - call bell within reach. Chair alarm on.   Additional comments:  Virgle Boards, BS Mobility Specialist Please contact via SecureChat or  Rehab office at [phone].

## 2024-07-03 NOTE — Progress Notes (Signed)
 Physical Therapy Treatment Patient Details Name: Fred [name] MRN: [id number] DOB: 03-15-1951 Today's Date: 07/03/2024   History of Present Illness Pt is a 73 y.o. M who presents 06/28/2024 with fatigue and AMS. T abdomen pelvis was obtained which showed left-sided pleural effusion and patchy ground glass opacities.  CT head showed no acute findings.  Patient was given ceftriaxone  and azithromycin  and admitted for further workup. Significant PMH: recurrent UTI, hypertension, cognitive delay.    PT Comments  Pt received ambulating in the hallway with mobility specialist and agreeable to session. Pt able to tolerate increased gait distance with HHA for support. Pt attempted to static march without UE support and demonstrates posterior lean and pt sits back to recliner. Pt's sister reports pt's balance and strength are below baseline and shuffled steps are new. Pt requests to use the bathroom at end of session and has urinary incontinence while ambulating to bathroom requiring assist for hygiene. Pt continues to benefit from PT services to progress toward functional mobility goals.    If plan is discharge home, recommend the following: A little help with walking and/or transfers;A little help with bathing/dressing/bathroom;Assistance with cooking/housework;Assist for transportation;Help with stairs or ramp for entrance;Supervision due to cognitive status   Can travel by private vehicle        Equipment Recommendations  None recommended by PT    Recommendations for Other Services       Precautions / Restrictions Precautions Precautions: Fall Restrictions Weight Bearing Restrictions Per Provider Order: No     Mobility  Bed Mobility               General bed mobility comments: Pt OOB at beginning and end of session    Transfers Overall transfer level: Needs assistance Equipment used: 1 person hand held assist Transfers: Sit to/from Stand Sit to Stand: Min assist            General transfer comment: assist for steadying and anterior weight shift    Ambulation/Gait Ambulation/Gait assistance: Contact guard assist, Min assist Gait Distance (Feet): 150 Feet (+10,+10) Assistive device: 1 person hand held assist Gait Pattern/deviations: Step-through pattern, Decreased stride length, Narrow base of support, Shuffle Gait velocity: decreased     General Gait Details: Pt demonstrates short steps with increased lateral leans for weight shifting. Intermittent min A for balance   Stairs             Wheelchair Mobility     Tilt Bed    Modified Rankin (Stroke Patients Only)       Balance Overall balance assessment: Needs assistance Sitting-balance support: Feet supported Sitting balance-Leahy Scale: Fair     Standing balance support: Single extremity supported, During functional activity Standing balance-Leahy Scale: Poor Standing balance comment: reliant on UE support                            Communication Communication Communication: Impaired Factors Affecting Communication: Difficulty expressing self  Cognition Arousal: Alert Behavior During Therapy: WFL for tasks assessed/performed   PT - Cognitive impairments: History of cognitive impairments                         Following commands: Intact      Cueing Cueing Techniques: Verbal cues, Gestural cues  Exercises      General Comments General comments (skin integrity, edema, etc.): Pt's sister present and supportive throughout      Pertinent Vitals/Pain Pain  Assessment Pain Assessment: No/denies pain     PT Goals (current goals can now be found in the care plan section) Acute Rehab PT Goals Patient Stated Goal: pt sister would like him to maintain his mobility PT Goal Formulation: With patient/family Time For Goal Achievement: 07/13/24 Progress towards PT goals: Progressing toward goals    Frequency    Min 1X/week       AM-PAC PT 6  Clicks Mobility   Outcome Measure  Help needed turning from your back to your side while in a flat bed without using bedrails?: A Little Help needed moving from lying on your back to sitting on the side of a flat bed without using bedrails?: A Little Help needed moving to and from a bed to a chair (including a wheelchair)?: A Little Help needed standing up from a chair using your arms (e.g., wheelchair or bedside chair)?: A Little Help needed to walk in hospital room?: A Little Help needed climbing 3-5 steps with a railing? : A Little 6 Click Score: 18    End of Session   Activity Tolerance: Patient tolerated treatment well Patient left: in chair;with call bell/phone within reach;with chair alarm set;with family/visitor present Nurse Communication: Mobility status PT Visit Diagnosis: Unsteadiness on feet (R26.81)     Time: 8466-8446 PT Time Calculation (min) (ACUTE ONLY): 20 min  Charges:    $Gait Training: 8-22 mins PT General Charges $$ ACUTE PT VISIT: 1 Visit                     Darryle George, PTA Acute Rehabilitation Services Secure Chat Preferred  [phone]    Darryle George 07/03/2024, 4:05 PM

## 2024-07-03 NOTE — Plan of Care (Signed)

## 2024-07-03 NOTE — Progress Notes (Signed)
 PROGRESS NOTE        PATIENT DETAILS Name: Fred [name] Age: 73 y.o. Sex: male Date of Birth: 10/23/1950 Admit Date: 06/28/2024 Admitting Physician Lamar Dess, MD ERE:Anldxj, Alm, MD  Brief Summary: Patient is a 73 y.o.  male with history of developmental/cognitive delay-recurrent UTIs-hypertension-who has had recurrent pleural effusion-recent pericardial effusion requiring pericardial drain July 2025-presented to the hospital with acute metabolic encephalopathy in the setting of PNA.  Significant events: 10/31>> admit to [facility]  Significant studies: 10/31>> CXR: Increasing left lower lobe airspace disease-concerning for pneumonia. 10/31>> CT abdomen/pelvis: Moderate left pleural effusion with patchy left lower lobe ground glass glass opacities. 10/31>> CT head: No acute intracranial abnormality  Significant microbiology data: 11/01>> urine culture: Enterococcus faecalis  Procedures: None  Consults: None  Subjective: Sitting up in bed-no family at bedside-answer simple questions appropriately.  Objective: Vitals: Blood pressure (!) 143/81, pulse 82, temperature 97.6 F (36.4 C), temperature source Axillary, resp. rate 17, height 5' 7 (1.702 m), weight 54.9 kg, SpO2 96%.   Exam: Gen Exam:Alert awake-not in any distress HEENT:atraumatic, normocephalic Chest: Moving air well-some minimal transmitted upper airway sounds CVS:S1S2 regular Abdomen:soft non tender, non distended Extremities:no edema Neurology: Non focal Skin: no rash  Pertinent Labs/Radiology:    Latest Ref Rng & Units 07/03/2024    3:01 AM 07/02/2024    4:18 AM 07/01/2024    4:23 AM  CBC  WBC 4.0 - 10.5 K/uL 5.0  6.6  4.0   Hemoglobin 13.0 - 17.0 g/dL 88.3  87.8  88.5   Hematocrit 39.0 - 52.0 % 38.1  39.4  35.9   Platelets 150 - 400 K/uL 154  153  129     Lab Results  Component Value Date   NA 141 07/03/2024   K 3.6 07/03/2024   CL 103 07/03/2024   CO2 27  07/03/2024      Assessment/Plan: Probable aspiration pneumonia Afebrile-no leukocytosis-but some transmitted upper airway sounds on exam Appreciate SLP input-dysphagia 2 diet Some congestion today-Will encourage incentive spirometry/flutter valve if possible-unfortunately he will remain at risk for ongoing aspiration episodes in the future-I spoke with his sister over the phone today-she is aware of this-and understands risk and is okay to continue with dysphagia to diet.  For now he is stable-Will encourage incentive spirometry/flutter valve-Will watch him overnight-if stable-he should be okay for discharge tomorrow with outpatient follow-up with palliative care.  Acute metabolic encephalopathy Secondary PNA Clinically improved-suspect back to baseline  Asymptomatic bacteriuria UTI ruled out-has no clinical symptoms of UTI.  Has had recurrent UTIs in the past-urine culture likely reflects colonization rather than infection.  AKI Likely hemodynamically mediated Resolved  CAD No anginal symptoms Aspirin /statin  Chronic PAF Euvolemic As needed diuretics if develops volume overload  History of pericardial effusion requiring drain July 2025 Continue colchicine  Recent outpatient echocardiogram stable Follows with [facility]  History of recurrent pleural effusion Likely exudative-numerous prior cytology is negative ?  Related to aspiration Continue outpatient follow-up with PCCM.  BPH Continue alfuzosin   History of cognitive impairment/developmental delay/mood disorder Continue risperidone /Zoloft /benzatropine  Code status:   Code Status: Limited: Do not attempt resuscitation (DNR) -DNR-LIMITED -Do Not Intubate/DNI    DVT Prophylaxis: enoxaparin  (LOVENOX ) injection 40 mg Start: 06/29/24 1000 SCDs Start: 06/29/24 0357   Family Communication: Sister-Cheryl Brown-[phone] updated over the phone 11/5   Disposition Plan: Status is: Inpatient  Remains inpatient  appropriate because: Severity of illness   Planned Discharge Destination:Group home   Diet: Diet Order             DIET DYS 2 Room service appropriate? Yes with Assist; Fluid consistency: Thin  Diet effective now                     Antimicrobial agents: Anti-infectives (From admission, onward)    Start     Dose/Rate Route Frequency Ordered Stop   06/30/24 1530  amoxicillin -clavulanate (AUGMENTIN ) 875-125 MG per tablet 1 tablet        1 tablet Oral Every 12 hours 06/30/24 1439     06/29/24 2200  cefTRIAXone  (ROCEPHIN ) 1 g in sodium chloride  0.9 % 100 mL IVPB  Status:  Discontinued        1 g 200 mL/hr over 30 Minutes Intravenous Every 24 hours 06/29/24 0448 06/30/24 1439   06/29/24 2200  azithromycin  (ZITHROMAX ) 500 mg in sodium chloride  0.9 % 250 mL IVPB        500 mg 250 mL/hr over 60 Minutes Intravenous Every 24 hours 06/29/24 0448 07/01/24 2304   06/29/24 0015  cefTRIAXone  (ROCEPHIN ) 1 g in sodium chloride  0.9 % 100 mL IVPB        1 g 200 mL/hr over 30 Minutes Intravenous  Once 06/29/24 0007 06/29/24 0059   06/29/24 0015  azithromycin  (ZITHROMAX ) 500 mg in sodium chloride  0.9 % 250 mL IVPB        500 mg 250 mL/hr over 60 Minutes Intravenous  Once 06/29/24 0007 06/29/24 0221        MEDICATIONS: Scheduled Meds:  alfuzosin   10 mg Oral Daily   amoxicillin -clavulanate  1 tablet Oral Q12H   aspirin  EC  81 mg Oral Daily   atorvastatin   20 mg Oral Daily   benztropine   0.5 mg Oral Daily   colchicine   0.6 mg Oral Daily   enoxaparin  (LOVENOX ) injection  40 mg Subcutaneous Daily   ferrous sulfate   325 mg Oral Q M,W,F   risperiDONE   0.5 mg Oral Daily   risperiDONE   3 mg Oral QHS   sertraline   50 mg Oral Daily   Continuous Infusions: PRN Meds:.acetaminophen  **OR** acetaminophen , ondansetron  **OR** ondansetron  (ZOFRAN ) IV   I have personally reviewed following labs and imaging studies  LABORATORY DATA: CBC: Recent Labs  Lab 06/28/24 2152 06/29/24 0451  07/01/24 0423 07/02/24 0418 07/03/24 0301  WBC 5.0 4.8 4.0 6.6 5.0  NEUTROABS 3.6  --   --   --   --   HGB 11.1* 12.5* 11.4* 12.1* 11.6*  HCT 35.9* 40.0 35.9* 39.4 38.1*  MCV 90.0 89.1 87.8 90.4 89.6  PLT 142* 171 129* 153 154    Basic Metabolic Panel: Recent Labs  Lab 06/28/24 2152 06/29/24 0451 07/01/24 0423 07/02/24 0418 07/03/24 0301  NA 143 142 141 143 141  K 3.2* 4.1 3.6 3.5 3.6  CL 108 105 105 104 103  CO2 24 24 25 28 27   GLUCOSE 85 70 101* 112* 125*  BUN 22 19 19 16 20   CREATININE 1.15 1.19 1.34* 1.24 1.22  CALCIUM  8.1* 9.0 8.6* 8.6* 8.5*  MG  --   --  1.8 1.8 2.1  PHOS  --   --  3.6 2.8 3.6    GFR: Estimated Creatinine Clearance: 41.3 mL/min (by C-G formula based on SCr of 1.22 mg/dL).  Liver Function Tests: Recent Labs  Lab 06/28/24 2152  AST 17  ALT  14  ALKPHOS 54  BILITOT 0.5  PROT 5.9*  ALBUMIN 2.7*   Recent Labs  Lab 06/28/24 2152  LIPASE 21   No results for input(s): AMMONIA in the last 168 hours.  Coagulation Profile: Recent Labs  Lab 06/29/24 0451  INR 1.1    Cardiac Enzymes: No results for input(s): CKTOTAL, CKMB, CKMBINDEX, TROPONINI in the last 168 hours.  BNP (last 3 results) No results for input(s): PROBNP in the last 8760 hours.  Lipid Profile: No results for input(s): CHOL, HDL, LDLCALC, TRIG, CHOLHDL, LDLDIRECT in the last 72 hours.  Thyroid Function Tests: No results for input(s): TSH, T4TOTAL, FREET4, T3FREE, THYROIDAB in the last 72 hours.  Anemia Panel: No results for input(s): VITAMINB12, FOLATE, FERRITIN, TIBC, IRON, RETICCTPCT in the last 72 hours.  Urine analysis:    Component Value Date/Time   COLORURINE STRAW (A) 06/29/2024 0129   APPEARANCEUR CLEAR 06/29/2024 0129   LABSPEC 1.016 06/29/2024 0129   PHURINE 8.0 06/29/2024 0129   GLUCOSEU NEGATIVE 06/29/2024 0129   HGBUR NEGATIVE 06/29/2024 0129   BILIRUBINUR NEGATIVE 06/29/2024 0129   KETONESUR NEGATIVE  06/29/2024 0129   PROTEINUR 100 (A) 06/29/2024 0129   UROBILINOGEN 1.0 02/27/2012 1053   NITRITE NEGATIVE 06/29/2024 0129   LEUKOCYTESUR TRACE (A) 06/29/2024 0129    Sepsis Labs: Lactic Acid, Venous    Component Value Date/Time   LATICACIDVEN 1.7 04/15/2024 1341    MICROBIOLOGY: Recent Results (from the past 240 hours)  Urine Culture     Status: Abnormal   Collection Time: 06/29/24  1:29 AM   Specimen: Urine, Clean Catch  Result Value Ref Range Status   Specimen Description URINE, CLEAN CATCH  Final   Special Requests   Final    NONE Performed at [facility], [address], [place], [place] [address]    Culture >=100,000 COLONIES/mL ENTEROCOCCUS FAECALIS (A)  Final   Report Status 07/01/2024 FINAL  Final   Organism ID, Bacteria ENTEROCOCCUS FAECALIS (A)  Final      Susceptibility   Enterococcus faecalis - MIC*    AMPICILLIN <=2 SENSITIVE Sensitive     NITROFURANTOIN <=16 SENSITIVE Sensitive     VANCOMYCIN  1 SENSITIVE Sensitive     * >=100,000 COLONIES/mL ENTEROCOCCUS FAECALIS    RADIOLOGY STUDIES/RESULTS: DG Swallowing Func-Speech Pathology Result Date: 07/02/2024 Table formatting from the original result was not included. Modified Barium Swallow Study Patient Details Name: Fred [name] MRN: [id number] Date of Birth: 08-03-1951 Today's Date: 07/02/2024 HPI/PMH: HPI: Pt is a 72 y.o. M who presents 06/28/2024 with fatigue and AMS. CT abdomen pelvis was obtained which showed left-sided pleural effusion and patchy ground glass opacities.  CT head showed no acute findings. MRI Cervical Spine 2021 with prominent degenerative endplate changes at C3-4 including bulky anterior vertebral spurring with prominent anterior spurring also noted C4-5 and C5-6. Per MD note 11/1 pt's sister reports coughing with PO intake. Pt was seen in August 2025 at which time he was admitted with hypoxic respiratory failure and pleural effusions. No signs of aspiration were noted, but he was impulsive  with solids and decision with sister was for more finely chopped foods. Significant PMH: recurrent UTI, hypertension, cognitive delay. Clinical Impression: Pt exhibits severe oropharyngeal dysphagia (DIGEST score of 3 primarily due to the chronicity and volume of aspiration) characterized by absent epiglottic deflection, incomplete laryngeal closure, and reduced pharyngeal peristalsis. Most recent cervical spine imaging 2021 confirms bulky anterior vertebral spurring of C3-4, C4-5, and C5-6, which limits epiglottic inversion and  also affects pharyngeal clearance and UES distension. All liquids are silently aspirated, most notably after the swallow and are only variably sensed (PAS 7, 8). Vallecular residuals of pureed solids also progressed over the epiglottis and into the laryngeal vestibule, though remained above the vocal folds (PAS 3). It is difficult to replicate the rate he uses during meals and although mastication is thorough, he is unable to clear his oral cavity of diffuse palatal residue, which did not clear with a liquid wash. Cognitive deficits limit his ability to use compensatory strategies, including a volitional cough. Pt's presentation is suspected to represent chronic dysphagia and it is recommended that he continue current diet using aspiration precautions. SLP will f/u for ongoing education. Factors that may increase risk of adverse event in presence of aspiration Noe & Lianne 2021): Factors that may increase risk of adverse event in presence of aspiration Noe & Lianne 2021): Reduced cognitive function; Limited mobility; Dependence for feeding and/or oral hygiene; Weak cough; Aspiration of thick, dense, and/or acidic materials; Frequent aspiration of large volumes Recommendations/Plan: Swallowing Evaluation Recommendations Swallowing Evaluation Recommendations Recommendations: PO diet PO Diet Recommendation: Dysphagia 2 (Finely chopped); Thin liquids (Level 0) Liquid Administration  via: Cup; Straw Medication Administration: Crushed with puree Supervision: Full assist for feeding; Full supervision/cueing for swallowing strategies Swallowing strategies  : Minimize environmental distractions; Slow rate; Small bites/sips Postural changes: Position pt fully upright for meals; Stay upright 30-60 min after meals Oral care recommendations: Oral care BID (2x/day); Oral care before PO; Staff/trained caregiver to provide oral care Treatment Plan Treatment Plan Treatment recommendations: Therapy as outlined in treatment plan below Follow-up recommendations: Home health SLP Functional status assessment: Patient has had a recent decline in their functional status and demonstrates the ability to make significant improvements in function in a reasonable and predictable amount of time. Treatment frequency: Min 2x/week Treatment duration: 1 week Interventions: Aspiration precaution training; Patient/family education; Trials of upgraded texture/liquids; Diet toleration management by SLP Recommendations Recommendations for follow up therapy are one component of a multi-disciplinary discharge planning process, led by the attending physician.  Recommendations may be updated based on patient status, additional functional criteria and insurance authorization. Assessment: Orofacial Exam: Orofacial Exam Oral Cavity: Oral Hygiene: WFL Oral Cavity - Dentition: Missing dentition Orofacial Anatomy: WFL Oral Motor/Sensory Function: WFL Anatomy: Anatomy: Suspected cervical osteophytes Boluses Administered: Boluses Administered Boluses Administered: Thin liquids (Level 0); Mildly thick liquids (Level 2, nectar thick); Moderately thick liquids (Level 3, honey thick); Puree; Solid  Oral Impairment Domain: Oral Impairment Domain Lip Closure: Escape progressing to mid-chin Tongue control during bolus hold: Not tested Bolus preparation/mastication: Timely and efficient chewing and mashing Bolus transport/lingual motion: Delayed  initiation of tongue motion (oral holding) Oral residue: Trace residue lining oral structures Location of oral residue : Tongue; Palate Initiation of pharyngeal swallow : Pyriform sinuses  Pharyngeal Impairment Domain: Pharyngeal Impairment Domain Soft palate elevation: No bolus between soft palate (SP)/pharyngeal wall (PW) Laryngeal elevation: Partial superior movement of thyroid cartilage/partial approximation of arytenoids to epiglottic petiole Anterior hyoid excursion: Partial anterior movement Epiglottic movement: No inversion Laryngeal vestibule closure: Incomplete, narrow column air/contrast in laryngeal vestibule Pharyngeal stripping wave : Present - diminished Pharyngeal contraction (A/P view only): N/A Pharyngoesophageal segment opening: Minimal distention/minimal duration, marked obstruction of flow Tongue base retraction: Trace column of contrast or air between tongue base and PPW Pharyngeal residue: Collection of residue within or on pharyngeal structures Location of pharyngeal residue: Tongue base; Valleculae; Pyriform sinuses  Esophageal Impairment Domain: No data recorded  Pill: No data recorded Penetration/Aspiration Scale Score: Penetration/Aspiration Scale Score 1.  Material does not enter airway: Solid 3.  Material enters airway, remains ABOVE vocal cords and not ejected out: Puree 8.  Material enters airway, passes BELOW cords without attempt by patient to eject out (silent aspiration) : Thin liquids (Level 0); Mildly thick liquids (Level 2, nectar thick); Moderately thick liquids (Level 3, honey thick) Compensatory Strategies: Compensatory Strategies Compensatory strategies: No   General Information: Caregiver present: No  Diet Prior to this Study: Dysphagia 2 (finely chopped); Thin liquids (Level 0)   Temperature : Normal   Respiratory Status: WFL   Supplemental O2: None (Room air)   History of Recent Intubation: No  Behavior/Cognition: Alert; Cooperative; Requires cueing Self-Feeding  Abilities: Needs set-up for self-feeding Baseline vocal quality/speech: Normal Volitional Cough: Unable to elicit Volitional Swallow: Able to elicit Exam Limitations: No limitations Goal Planning: Prognosis for improved oropharyngeal function: Fair Barriers to Reach Goals: Cognitive deficits; Time post onset No data recorded Patient/Family Stated Goal: none stated Consulted and agree with results and recommendations: Pt unable/family or caregiver not available; Family member/caregiver; Physician Pain: Pain Assessment Pain Assessment: Faces Faces Pain Scale: 0 Pain Intervention(s): Monitored during session End of Session: Start Time:SLP Start Time (ACUTE ONLY): 1446 Stop Time: SLP Stop Time (ACUTE ONLY): 1501 Time Calculation:SLP Time Calculation (min) (ACUTE ONLY): 15 min Charges: SLP Evaluations $ SLP Speech Visit: 1 Visit SLP Evaluations $MBS Swallow: 1 Procedure $Swallowing Treatment: 1 Procedure SLP visit diagnosis: SLP Visit Diagnosis: Dysphagia, oropharyngeal phase (R13.12) Past Medical History: Past Medical History: Diagnosis Date  Acute UTI (urinary tract infection) 01/01/2023  AKI (acute kidney injury) 11/02/2022  Diabetes mellitus   Hypertension   Mental retardation   Severe sepsis (HCC) 11/02/2022 Past Surgical History: Past Surgical History: Procedure Laterality Date  IR THORACENTESIS ASP PLEURAL SPACE W/IMG GUIDE  06/20/2024  PERICARDIOCENTESIS N/A 03/09/2024  Procedure: PERICARDIOCENTESIS;  Surgeon: Jordan, Peter M, MD;  Location: [facility] INVASIVE CV LAB;  Service: Cardiovascular;  Laterality: N/A; Damien Blumenthal, M.A., CCC-SLP Speech Language Pathology, Acute Rehabilitation Services Secure Chat preferred [phone] 07/02/2024, 3:21 PM    LOS: 4 days   Donalda Applebaum, MD  [facility]    To contact the attending provider between 7A-7P or the covering provider during after hours 7P-7A, please log into the web site [email or web address] and access using universal [place] password for that web site.  If you do not have the password, please call the hospital operator.  07/03/2024, 11:21 AM

## 2024-07-04 ENCOUNTER — Other Ambulatory Visit (HOSPITAL_COMMUNITY): Payer: Self-pay

## 2024-07-04 DIAGNOSIS — J189 Pneumonia, unspecified organism: Secondary | ICD-10-CM | POA: Diagnosis not present

## 2024-07-04 DIAGNOSIS — N390 Urinary tract infection, site not specified: Secondary | ICD-10-CM | POA: Diagnosis not present

## 2024-07-04 DIAGNOSIS — G9349 Other encephalopathy: Secondary | ICD-10-CM | POA: Diagnosis not present

## 2024-07-04 DIAGNOSIS — E119 Type 2 diabetes mellitus without complications: Secondary | ICD-10-CM

## 2024-07-04 MED ORDER — AMOXICILLIN-POT CLAVULANATE 875-125 MG PO TABS
1.0000 | ORAL_TABLET | Freq: Two times a day (BID) | ORAL | 0 refills | Status: AC
  Filled 2024-07-04: qty 2, 1d supply, fill #0

## 2024-07-04 MED ORDER — AMLODIPINE BESYLATE 5 MG PO TABS
5.0000 mg | ORAL_TABLET | Freq: Every day | ORAL | 2 refills | Status: AC
  Filled 2024-07-04: qty 30, 30d supply, fill #0

## 2024-07-04 NOTE — TOC Transition Note (Signed)
 Transition of Care [facility]) - Discharge Note   Patient Details  Name: Fred [name] MRN: [id number] Date of Birth: 1951-05-03  [facility] CM/SW Contact:  Inocente GORMAN Kindle, LCSW Phone Number: 07/04/2024, 9:39 AM   Clinical Narrative:    Patient discharging back to his group home with no needs. RN to contact family/caregiver once ready to be picked up.      Barriers to Discharge: Continued Medical Work up   Patient Goals and CMS Choice            Discharge Placement                       Discharge Plan and Services Additional resources added to the After Visit Summary for                                       Social Drivers of Health (SDOH) Interventions SDOH Screenings   Food Insecurity: Patient Unable To Answer (06/29/2024)  Housing: Unknown (06/29/2024)  Transportation Needs: Patient Unable To Answer (06/29/2024)  Utilities: Patient Unable To Answer (06/29/2024)  Financial Resource Strain: Low Risk  (03/20/2024)   Received from [facility]  Physical Activity: Unknown (04/26/2023)   Received from [facility]  Social Connections: Patient Unable To Answer (06/29/2024)  Stress: No Stress Concern Present (04/26/2023)   Received from [facility]  Tobacco Use: Medium Risk (06/28/2024)     Readmission Risk Interventions     No data to display

## 2024-07-04 NOTE — TOC Initial Note (Signed)
 Transition of Care [facility]) - Initial/Assessment Note    Patient Details  Name: Fred [name] MRN: [id number] Date of Birth: May 06, 1951  [facility]) CM/SW Contact:    Inocente GORMAN Kindle, LCSW Phone Number: 07/04/2024, 9:38 AM  Clinical Narrative:                 Patient admitted from group home. No current needs identified at this time but please place consult if needs arise.   Expected Discharge Plan: Home/Self Care Barriers to Discharge: Continued Medical Work up   Patient Goals and CMS Choice            Expected Discharge Plan and Services         Expected Discharge Date: 07/04/24                                    Prior Living Arrangements/Services   Lives with:: Facility Resident Patient language and need for interpreter reviewed:: Yes Do you feel safe going back to the place where you live?: Yes      Need for Family Participation in Patient Care: Yes (Comment) Care giver support system in place?: Yes (comment)   Criminal Activity/Legal Involvement Pertinent to Current Situation/Hospitalization: No - Comment as needed  Activities of Daily Living      Permission Sought/Granted      Share Information with NAME: Delores [name] JONETTA GLENWOODSister [phone]           Emotional Assessment Appearance:: Appears stated age Attitude/Demeanor/Rapport: Unable to Assess Affect (typically observed): Unable to Assess Orientation: : Oriented to Self Alcohol / Substance Use: Not Applicable Psych Involvement: No (comment)  Admission diagnosis:  Lower urinary tract infectious disease [N39.0] Infectious encephalopathy [G93.49, B99.9] Community acquired pneumonia, unspecified laterality [J18.9] Patient Active Problem List   Diagnosis Date Noted   Infectious encephalopathy 06/29/2024   Respiratory failure, acute (HCC) 04/15/2024   Pericardial effusion 01/23/2024   Irregular heart beat 01/23/2024   Contusion of toenail of right foot 02/01/2023   Acute  cystitis without hematuria 11/02/2022   Acute metabolic encephalopathy 11/02/2022   Stage 3a chronic kidney disease (CKD) (HCC) - baseline SCr 1.3 11/02/2022   Hypokalemia 11/02/2022   DNR (do not resuscitate)/DNI(Do Not Intubate) 11/02/2022   Xerosis of skin 12/29/2021   Pain due to onychomycosis of toenails of both feet 02/08/2019   Aortic ectasia, abdominal 04/23/2018   Anemia 08/06/2017   Elevated prostate specific antigen (PSA) 06/29/2014   Incomplete emptying of bladder 06/29/2014   Urinary incontinence 06/29/2014   BPH with urinary obstruction 06/27/2014   Hypertension associated with diabetes (HCC) 09/19/2012   Intellectual disability 09/19/2012   Type 2 diabetes mellitus without complication, without long-term current use of insulin  (HCC) 09/19/2012   PCP:  Pura Lenis, MD Pharmacy:   [facility] - [place], [place] - [address] [place] [address] Phone: [phone] Fax: [phone]  [facility] - [place], [place] - [address] [place] [address] Phone: [phone] Fax: [phone]  [facility] [address] [place] [address] Phone: [phone] Fax: [phone]     Social Drivers of Health (SDOH) Social History: SDOH Screenings   Food Insecurity: Patient Unable To Answer (06/29/2024)  Housing: Unknown (06/29/2024)  Transportation Needs: Patient Unable To Answer (06/29/2024)  Utilities: Patient Unable To Answer (06/29/2024)  Financial Resource Strain: Low Risk  (03/20/2024)   Received from [facility]  Physical Activity: Unknown (04/26/2023)   Received from [facility]  Social Connections: Patient Unable To Answer (06/29/2024)  Stress: No Stress Concern Present (04/26/2023)   Received from [facility]  Tobacco Use: Medium Risk (06/28/2024)   SDOH Interventions:     Readmission Risk Interventions     No data to display

## 2024-07-04 NOTE — Plan of Care (Signed)

## 2024-07-04 NOTE — Progress Notes (Signed)
 Occupational Therapy Treatment Patient Details Name: Fred [name] MRN: [id number] DOB: [birth year] Today's Date: 07/04/2024   History of present illness Pt is a 73 y.o. M who presents 06/28/2024 with fatigue and AMS. T abdomen pelvis was obtained which showed left-sided pleural effusion and patchy ground glass opacities.  CT head showed no acute findings.  Patient was given ceftriaxone  and azithromycin  and admitted for further workup. Significant PMH: recurrent UTI, hypertension, cognitive delay.   OT comments  Patient discharging to group home today.  Able to complete bathing and dressing task sit to stand sinkside with setup and up to Min A and cues fir thoroughness.  No post acute OT recommended.        If plan is discharge home, recommend the following:  A little help with walking and/or transfers;A little help with bathing/dressing/bathroom   Equipment Recommendations  None recommended by OT    Recommendations for Other Services      Precautions / Restrictions Precautions Precautions: Fall Restrictions Weight Bearing Restrictions Per Provider Order: No       Mobility Bed Mobility Overal bed mobility: Needs Assistance Bed Mobility: Supine to Sit     Supine to sit: Supervision, HOB elevated          Transfers Overall transfer level: Needs assistance Equipment used: 1 person hand held assist Transfers: Sit to/from Stand Sit to Stand: Contact guard assist                 Balance Overall balance assessment: Needs assistance Sitting-balance support: Feet supported Sitting balance-Leahy Scale: Good     Standing balance support: Single extremity supported Standing balance-Leahy Scale: Fair                             ADL either performed or assessed with clinical judgement   ADL           Upper Body Bathing: Minimal assistance;Sitting;Standing   Lower Body Bathing: Minimal assistance;Sit to/from stand   Upper Body Dressing : Set  up;Sitting   Lower Body Dressing: Contact guard assist;Sit to/from stand   Toilet Transfer: Contact guard assist;Minimal assistance                  Extremity/Trunk Assessment Upper Extremity Assessment Upper Extremity Assessment: Overall WFL for tasks assessed   Lower Extremity Assessment Lower Extremity Assessment: Defer to PT evaluation   Cervical / Trunk Assessment Cervical / Trunk Assessment: Kyphotic    Vision   Vision Assessment?: No apparent visual deficits   Perception Perception Perception: Not tested   Praxis Praxis Praxis: Not tested   Communication Communication Communication: Impaired Factors Affecting Communication: Difficulty expressing self   Cognition Arousal: Alert Behavior During Therapy: WFL for tasks assessed/performed Cognition: History of cognitive impairments             OT - Cognition Comments: hx of cognitive delay                 Following commands: Intact        Cueing   Cueing Techniques: Verbal cues, Gestural cues  Exercises      Shoulder Instructions       General Comments  Off tele    Pertinent Vitals/ Pain       Pain Assessment Pain Assessment: No/denies pain  Frequency           Progress Toward Goals  OT Goals(current goals can now be found in the care plan section)  Progress towards OT goals: Goals met/education completed, patient discharged from OT  Acute Rehab OT Goals OT Goal Formulation: With patient Time For Goal Achievement: 07/05/24 Potential to Achieve Goals: Good  Plan      Co-evaluation                 AM-PAC OT 6 Clicks Daily Activity     Outcome Measure   Help from another person eating meals?: None Help from another person taking care of personal grooming?: A Little Help from another person toileting, which includes using toliet, bedpan, or urinal?: A Little Help from another  person bathing (including washing, rinsing, drying)?: A Little Help from another person to put on and taking off regular upper body clothing?: A Little Help from another person to put on and taking off regular lower body clothing?: A Little 6 Click Score: 19    End of Session Equipment Utilized During Treatment: Gait belt  OT Visit Diagnosis: Other abnormalities of gait and mobility (R26.89);Unsteadiness on feet (R26.81);Muscle weakness (generalized) (M62.81)   Activity Tolerance Patient tolerated treatment well   Patient Left in chair;with call bell/phone within reach;with chair alarm set   Nurse Communication Mobility status        Time: 0950-1003 OT Time Calculation (min): 13 min  Charges: OT General Charges $OT Visit: 1 Visit OT Treatments $Self Care/Home Management : 8-22 mins  07/04/2024  RP, OTR/L  Acute Rehabilitation Services  Office:  [phone]   Charlie JONETTA Halsted 07/04/2024, 10:20 AM

## 2024-07-04 NOTE — Discharge Summary (Signed)
 PATIENT DETAILS Name: Fred [name] Age: 73 y.o. Sex: male Date of Birth: [birth year] MRN: [id number]. Admitting Physician: Lamar Dess, MD ERE:Anldxj, Alm, MD  Admit Date: 06/28/2024 Discharge date: 07/04/2024  Recommendations for Outpatient Follow-up:  Follow up with PCP in 1-2 weeks Please obtain CMP/CBC in one week Please ensure follow-up with pulmonology and cardiology.  Admitted From:  Home  Disposition: Home   Discharge Condition: good  CODE STATUS:   Code Status: Limited: Do not attempt resuscitation (DNR) -DNR-LIMITED -Do Not Intubate/DNI    Diet recommendation:  Diet Order             Diet - low sodium heart healthy           Diet Carb Modified           DIET DYS 2 Room service appropriate? Yes with Assist; Fluid consistency: Thin  Diet effective now                    Brief Summary: Patient is a 73 y.o.  male with history of developmental/cognitive delay-recurrent UTIs-hypertension-who has had recurrent pleural effusion-recent pericardial effusion requiring pericardial drain July 2025-presented to the hospital with acute metabolic encephalopathy in the setting of PNA.   Significant events: 10/31>> admit to [facility]   Significant studies: 10/31>> CXR: Increasing left lower lobe airspace disease-concerning for pneumonia. 10/31>> CT abdomen/pelvis: Moderate left pleural effusion with patchy left lower lobe ground glass glass opacities. 10/31>> CT head: No acute intracranial abnormality   Significant microbiology data: 11/01>> urine culture: Enterococcus faecalis   Procedures: None   Consults: None  Brief Hospital Course: Probable aspiration pneumonia Overall much improved-afebrile-on room air-lung sounds clear on exam. Treated with Rocephin /Zithromax  initially-has been transition to Augmentin -patient-will be discharged on 1 more day of Augmentin . Evaluated by SLP-started on dysphagia to diet which she seems to be tolerating. This MD had a  discussion with patient's sister over the phone on 11/5-she understands that given patient's cognitive issues/developmental delay-he will always remain at risk for aspiration pneumonia in the future.  Thankfully he has stabilized-and is overall improved-and will be discharged back to his group home.   Acute metabolic encephalopathy Secondary PNA Clinically improved-suspect back to baseline   Asymptomatic bacteriuria UTI ruled out-has no clinical symptoms of UTI.  Has had recurrent UTIs in the past-urine culture likely reflects colonization rather than infection.   AKI Likely hemodynamically mediated Resolved   CAD No anginal symptoms Aspirin /statin   Chronic PAF Euvolemic As needed diuretics if develops volume overload   History of pericardial effusion requiring drain July 2025 Continue colchicine  Recent outpatient echocardiogram stable Follows with [facility] cardiology-please ensure follow-up as previously scheduled.   History of recurrent pleural effusion Likely exudative-numerous prior cytology is negative ?  Related to aspiration Continue outpatient follow-up with PCCM.  HTN BP creeping up-resume amlodipine  Coreg /lisinopril/HCTZ remain on hold (these were held prior to this hospitalization) resume at the discretion of PCP.   BPH Continue alfuzosin    History of cognitive impairment/developmental delay/mood disorder Continue risperidone /Zoloft /benzatropine   Discharge Diagnoses:  Principal Problem:   Infectious encephalopathy   Discharge Instructions:  Activity:  As tolerated    Discharge Instructions     Call MD for:  difficulty breathing, headache or visual disturbances   Complete by: As directed    Diet - low sodium heart healthy   Complete by: As directed    Diet Carb Modified   Complete by: As directed    Discharge instructions   Complete by: As  directed    Follow with Primary MD  Pura Lenis, MD in 1-2 weeks  Please get a complete blood count  and chemistry panel checked by your Primary MD at your next visit, and again as instructed by your Primary MD.  Get Medicines reviewed and adjusted: Please take all your medications with you for your next visit with your Primary MD  Laboratory/radiological data: Please request your Primary MD to go over all hospital tests and procedure/radiological results at the follow up, please ask your Primary MD to get all Hospital records sent to his/her office.  In some cases, they will be blood work, cultures and biopsy results pending at the time of your discharge. Please request that your primary care M.D. follows up on these results.  Also Note the following: If you experience worsening of your admission symptoms, develop shortness of breath, life threatening emergency, suicidal or homicidal thoughts you must seek medical attention immediately by calling 911 or calling your MD immediately  if symptoms less severe.  You must read complete instructions/literature along with all the possible adverse reactions/side effects for all the Medicines you take and that have been prescribed to you. Take any new Medicines after you have completely understood and accpet all the possible adverse reactions/side effects.   Do not drive when taking Pain medications or sleeping medications (Benzodaizepines)  Do not take more than prescribed Pain, Sleep and Anxiety Medications. It is not advisable to combine anxiety,sleep and pain medications without talking with your primary care practitioner  Special Instructions: If you have smoked or chewed Tobacco  in the last 2 yrs please stop smoking, stop any regular Alcohol  and or any Recreational drug use.  Wear Seat belts while driving.  Please note: You were cared for by a hospitalist during your hospital stay. Once you are discharged, your primary care physician will handle any further medical issues. Please note that NO REFILLS for any discharge medications will be  authorized once you are discharged, as it is imperative that you return to your primary care physician (or establish a relationship with a primary care physician if you do not have one) for your post hospital discharge needs so that they can reassess your need for medications and monitor your lab values.   Increase activity slowly   Complete by: As directed       Allergies as of 07/04/2024   No Known Allergies      Medication List     PAUSE taking these medications    carvedilol  6.25 MG tablet Wait to take this until your doctor or other care provider tells you to start again. Commonly known as: COREG  Take 1 tablet (6.25 mg total) by mouth 2 (two) times daily with a meal.   lisinopril-hydrochlorothiazide 20-25 MG tablet Wait to take this until your doctor or other care provider tells you to start again. Commonly known as: ZESTORETIC Take by mouth.   potassium chloride  10 MEQ tablet Wait to take this until your doctor or other care provider tells you to start again. Commonly known as: KLOR-CON  Take 10 mEq by mouth daily.       TAKE these medications    alfuzosin  10 MG 24 hr tablet Commonly known as: UROXATRAL  Take 10 mg by mouth daily after breakfast.   Allergy Relief 10 MG tablet Generic drug: loratadine  Take 10 mg by mouth daily.   amLODipine  5 MG tablet Commonly known as: NORVASC  Take 1 tablet (5 mg total) by mouth daily.  amoxicillin -clavulanate 875-125 MG tablet Commonly known as: AUGMENTIN  Take 1 tablet by mouth 2 (two) times daily for 1 day.   aspirin  EC 81 MG tablet Take 81 mg by mouth daily.   atorvastatin  20 MG tablet Commonly known as: LIPITOR Take 20 mg by mouth daily.   benztropine  0.5 MG tablet Commonly known as: COGENTIN  Take 0.5 mg by mouth daily.   colchicine  0.6 MG tablet Take 1 tablet (0.6 mg total) by mouth daily.   ferrous sulfate  325 (65 FE) MG tablet Take 325 mg by mouth every Monday, Wednesday, and Friday.   ketoconazole 2 %  shampoo Commonly known as: NIZORAL Apply 1 Application topically 2 (two) times a week.   metFORMIN 500 MG tablet Commonly known as: GLUCOPHAGE Take 500 mg by mouth 2 (two) times daily.   risperiDONE  0.5 MG tablet Commonly known as: RISPERDAL  Take 0.5 mg by mouth daily.   risperiDONE  3 MG tablet Commonly known as: RISPERDAL  Take 3 mg by mouth at bedtime.   sertraline  50 MG tablet Commonly known as: ZOLOFT  Take 50 mg by mouth daily.        Follow-up Information     Pura Lenis, MD. Schedule an appointment as soon as possible for a visit in 1 week(s).   Specialty: Family Medicine Contact information: [address] [place] [address] [phone]                No Known Allergies   Other Procedures/Studies: DG Swallowing Func-Speech Pathology Result Date: 07/02/2024 Table formatting from the original result was not included. Modified Barium Swallow Study Patient Details Name: Briston Lax MRN: [id number] Date of Birth: 11/19/1950 Today's Date: 07/02/2024 HPI/PMH: HPI: Pt is a 73 y.o. M who presents 06/28/2024 with fatigue and AMS. CT abdomen pelvis was obtained which showed left-sided pleural effusion and patchy ground glass opacities.  CT head showed no acute findings. MRI Cervical Spine 2021 with prominent degenerative endplate changes at C3-4 including bulky anterior vertebral spurring with prominent anterior spurring also noted C4-5 and C5-6. Per MD note 11/1 pt's sister reports coughing with PO intake. Pt was seen in August 2025 at which time he was admitted with hypoxic respiratory failure and pleural effusions. No signs of aspiration were noted, but he was impulsive with solids and decision with sister was for more finely chopped foods. Significant PMH: recurrent UTI, hypertension, cognitive delay. Clinical Impression: Pt exhibits severe oropharyngeal dysphagia (DIGEST score of 3 primarily due to the chronicity and volume of aspiration)  characterized by absent epiglottic deflection, incomplete laryngeal closure, and reduced pharyngeal peristalsis. Most recent cervical spine imaging 2021 confirms bulky anterior vertebral spurring of C3-4, C4-5, and C5-6, which limits epiglottic inversion and also affects pharyngeal clearance and UES distension. All liquids are silently aspirated, most notably after the swallow and are only variably sensed (PAS 7, 8). Vallecular residuals of pureed solids also progressed over the epiglottis and into the laryngeal vestibule, though remained above the vocal folds (PAS 3). It is difficult to replicate the rate he uses during meals and although mastication is thorough, he is unable to clear his oral cavity of diffuse palatal residue, which did not clear with a liquid wash. Cognitive deficits limit his ability to use compensatory strategies, including a volitional cough. Pt's presentation is suspected to represent chronic dysphagia and it is recommended that he continue current diet using aspiration precautions. SLP will f/u for ongoing education. Factors that may increase risk of adverse event in presence of aspiration Noe &  Lianne 2021): Factors that may increase risk of adverse event in presence of aspiration Noe & Lianne 2021): Reduced cognitive function; Limited mobility; Dependence for feeding and/or oral hygiene; Weak cough; Aspiration of thick, dense, and/or acidic materials; Frequent aspiration of large volumes Recommendations/Plan: Swallowing Evaluation Recommendations Swallowing Evaluation Recommendations Recommendations: PO diet PO Diet Recommendation: Dysphagia 2 (Finely chopped); Thin liquids (Level 0) Liquid Administration via: Cup; Straw Medication Administration: Crushed with puree Supervision: Full assist for feeding; Full supervision/cueing for swallowing strategies Swallowing strategies  : Minimize environmental distractions; Slow rate; Small bites/sips Postural changes: Position pt fully  upright for meals; Stay upright 30-60 min after meals Oral care recommendations: Oral care BID (2x/day); Oral care before PO; Staff/trained caregiver to provide oral care Treatment Plan Treatment Plan Treatment recommendations: Therapy as outlined in treatment plan below Follow-up recommendations: Home health SLP Functional status assessment: Patient has had a recent decline in their functional status and demonstrates the ability to make significant improvements in function in a reasonable and predictable amount of time. Treatment frequency: Min 2x/week Treatment duration: 1 week Interventions: Aspiration precaution training; Patient/family education; Trials of upgraded texture/liquids; Diet toleration management by SLP Recommendations Recommendations for follow up therapy are one component of a multi-disciplinary discharge planning process, led by the attending physician.  Recommendations may be updated based on patient status, additional functional criteria and insurance authorization. Assessment: Orofacial Exam: Orofacial Exam Oral Cavity: Oral Hygiene: WFL Oral Cavity - Dentition: Missing dentition Orofacial Anatomy: WFL Oral Motor/Sensory Function: WFL Anatomy: Anatomy: Suspected cervical osteophytes Boluses Administered: Boluses Administered Boluses Administered: Thin liquids (Level 0); Mildly thick liquids (Level 2, nectar thick); Moderately thick liquids (Level 3, honey thick); Puree; Solid  Oral Impairment Domain: Oral Impairment Domain Lip Closure: Escape progressing to mid-chin Tongue control during bolus hold: Not tested Bolus preparation/mastication: Timely and efficient chewing and mashing Bolus transport/lingual motion: Delayed initiation of tongue motion (oral holding) Oral residue: Trace residue lining oral structures Location of oral residue : Tongue; Palate Initiation of pharyngeal swallow : Pyriform sinuses  Pharyngeal Impairment Domain: Pharyngeal Impairment Domain Soft palate elevation: No  bolus between soft palate (SP)/pharyngeal wall (PW) Laryngeal elevation: Partial superior movement of thyroid cartilage/partial approximation of arytenoids to epiglottic petiole Anterior hyoid excursion: Partial anterior movement Epiglottic movement: No inversion Laryngeal vestibule closure: Incomplete, narrow column air/contrast in laryngeal vestibule Pharyngeal stripping wave : Present - diminished Pharyngeal contraction (A/P view only): N/A Pharyngoesophageal segment opening: Minimal distention/minimal duration, marked obstruction of flow Tongue base retraction: Trace column of contrast or air between tongue base and PPW Pharyngeal residue: Collection of residue within or on pharyngeal structures Location of pharyngeal residue: Tongue base; Valleculae; Pyriform sinuses  Esophageal Impairment Domain: No data recorded Pill: No data recorded Penetration/Aspiration Scale Score: Penetration/Aspiration Scale Score 1.  Material does not enter airway: Solid 3.  Material enters airway, remains ABOVE vocal cords and not ejected out: Puree 8.  Material enters airway, passes BELOW cords without attempt by patient to eject out (silent aspiration) : Thin liquids (Level 0); Mildly thick liquids (Level 2, nectar thick); Moderately thick liquids (Level 3, honey thick) Compensatory Strategies: Compensatory Strategies Compensatory strategies: No   General Information: Caregiver present: No  Diet Prior to this Study: Dysphagia 2 (finely chopped); Thin liquids (Level 0)   Temperature : Normal   Respiratory Status: WFL   Supplemental O2: None (Room air)   History of Recent Intubation: No  Behavior/Cognition: Alert; Cooperative; Requires cueing Self-Feeding Abilities: Needs set-up for self-feeding Baseline vocal quality/speech: Normal Volitional  Cough: Unable to elicit Volitional Swallow: Able to elicit Exam Limitations: No limitations Goal Planning: Prognosis for improved oropharyngeal function: Fair Barriers to Reach Goals: Cognitive  deficits; Time post onset No data recorded Patient/Family Stated Goal: none stated Consulted and agree with results and recommendations: Pt unable/family or caregiver not available; Family member/caregiver; Physician Pain: Pain Assessment Pain Assessment: Faces Faces Pain Scale: 0 Pain Intervention(s): Monitored during session End of Session: Start Time:SLP Start Time (ACUTE ONLY): 1446 Stop Time: SLP Stop Time (ACUTE ONLY): 1501 Time Calculation:SLP Time Calculation (min) (ACUTE ONLY): 15 min Charges: SLP Evaluations $ SLP Speech Visit: 1 Visit SLP Evaluations $MBS Swallow: 1 Procedure $Swallowing Treatment: 1 Procedure SLP visit diagnosis: SLP Visit Diagnosis: Dysphagia, oropharyngeal phase (R13.12) Past Medical History: Past Medical History: Diagnosis Date  Acute UTI (urinary tract infection) 01/01/2023  AKI (acute kidney injury) 11/02/2022  Diabetes mellitus   Hypertension   Mental retardation   Severe sepsis (HCC) 11/02/2022 Past Surgical History: Past Surgical History: Procedure Laterality Date  IR THORACENTESIS ASP PLEURAL SPACE W/IMG GUIDE  06/20/2024  PERICARDIOCENTESIS N/A 03/09/2024  Procedure: PERICARDIOCENTESIS;  Surgeon: Jordan, Peter M, MD;  Location: [facility] INVASIVE CV LAB;  Service: Cardiovascular;  Laterality: N/A; Damien Blumenthal, M.A., CCC-SLP Speech Language Pathology, Acute Rehabilitation Services Secure Chat preferred [phone] 07/02/2024, 3:21 PM  CT ABDOMEN PELVIS W CONTRAST Result Date: 06/29/2024 EXAM: CT ABDOMEN AND PELVIS WITH CONTRAST 06/28/2024 11:41:16 PM TECHNIQUE: CT of the abdomen and pelvis was performed with the administration of 75 mL of intravenous iohexol  (OMNIPAQUE ) 350 MG/ML injection. Multiplanar reformatted images are provided for review. Automated exposure control, iterative reconstruction, and/or weight-based adjustment of the mA/kV was utilized to reduce the radiation dose to as low as reasonably achievable. COMPARISON: 04/15/2024 CLINICAL HISTORY: Abdominal pain, acute,  nonlocalized. FINDINGS: LOWER CHEST: Moderate left pleural effusion. Patchy ground glass opacities in the left lower lobe could reflect atelectasis or infiltrate. The previously seen large right pleural effusion has resolved. Mild cardiomegaly. Aortic atherosclerosis. LIVER: The liver is unremarkable. GALLBLADDER AND BILE DUCTS: Gallbladder is unremarkable. No biliary ductal dilatation. SPLEEN: No acute abnormality. PANCREAS: No acute abnormality. ADRENAL GLANDS: No acute abnormality. KIDNEYS, URETERS AND BLADDER: Numerous bilateral renal cysts appear simple and stable. Per consensus, no follow-up is needed for simple Bosniak type 1 and 2 renal cysts, unless the patient has a malignancy history or risk factors. No stones in the kidneys or ureters. No hydronephrosis. No perinephric or periureteral stranding. Urinary bladder is unremarkable. GI AND BOWEL: Stomach demonstrates no acute abnormality. Large stool burden throughout the colon. Normal appendix. There is no bowel obstruction. PERITONEUM AND RETROPERITONEUM: No ascites. No free air. VASCULATURE: Aorta is normal in caliber. Aortic atherosclerosis. LYMPH NODES: No lymphadenopathy. REPRODUCTIVE ORGANS: No acute abnormality. BONES AND SOFT TISSUES: Degenerative disc and facet disease in the lumbar spine. No focal soft tissue abnormality. IMPRESSION: 1. Moderate left pleural effusion with patchy left lower lobe ground-glass opacities, which may reflect atelectasis or infiltrate. 2. . No acute findings in the abdomen or pelvis. 3. Aortic atherosclerosis. Electronically signed by: Franky Crease MD 06/29/2024 12:02 AM EDT RP Workstation: [id number]   CT Head Wo Contrast Result Date: 06/29/2024 CLINICAL DATA:  Altered mental status. EXAM: CT HEAD WITHOUT CONTRAST TECHNIQUE: Contiguous axial images were obtained from the base of the skull through the vertex without intravenous contrast. RADIATION DOSE REDUCTION: This exam was performed according to the departmental  dose-optimization program which includes automated exposure control, adjustment of the mA and/or kV according to patient size and/or  use of iterative reconstruction technique. COMPARISON:  Jan 03, 2024 FINDINGS: Brain: There is generalized cerebral atrophy with widening of the extra-axial spaces and ventricular dilatation. There are areas of decreased attenuation within the white matter tracts of the supratentorial brain, consistent with microvascular disease changes. Vascular: Moderate to marked severity bilateral cavernous carotid artery calcification is noted. Skull: Normal. Negative for fracture or focal lesion. Sinuses/Orbits: No acute finding. Other: None. IMPRESSION: 1. Generalized cerebral atrophy and microvascular disease changes of the supratentorial brain. 2. No acute intracranial abnormality. Electronically Signed   By: Suzen Dials M.D.   On: 06/29/2024 00:01   DG Chest Portable 1 View Result Date: 06/28/2024 EXAM: 1 VIEW(S) XRAY OF THE CHEST 06/28/2024 11:21:54 PM COMPARISON: 06/20/2024 CLINICAL HISTORY: ams, tachypnea, assess for pleural effusions, hx of the same FINDINGS: LUNGS AND PLEURA: Increasing left lower lobe airspace opacity concerning for pneumonia. No pulmonary edema. No pleural effusion. No pneumothorax. HEART AND MEDIASTINUM: No acute abnormality of the cardiac and mediastinal silhouettes. BONES AND SOFT TISSUES: No acute osseous abnormality. IMPRESSION: 1. Increasing left lower lobe airspace opacity concerning for pneumonia. Electronically signed by: Franky Crease MD 06/28/2024 11:31 PM EDT RP Workstation: [id number]   IR THORACENTESIS ASP PLEURAL SPACE W/IMG GUIDE Result Date: 06/20/2024 INDICATION: Recent hospital discharge in August 2025 after being treated for pneumonia with bilateral pleural effusions. Found to have recurrent pleural effusions at outpatient pulmonology office visit. IR consulted for repeat diagnostic thoracentesis for recurrent pleural effusions. EXAM:  ULTRASOUND GUIDED DIAGNOSTIC LEFT THORACENTESIS MEDICATIONS: 6 mL 1% lidocaine . COMPLICATIONS: None immediate. PROCEDURE: An ultrasound guided thoracentesis was thoroughly discussed with the patient and questions answered. The benefits, risks, alternatives and complications were also discussed. The patient understands and wishes to proceed with the procedure. Written consent was obtained. Ultrasound was performed to localize and mark an adequate pocket of fluid in the left chest. There was a small pleural effusion in the lower left chest. No visualized right pleural effusion. The area was then prepped and draped in the normal sterile fashion. 1% Lidocaine  was used for local anesthesia. Under ultrasound guidance a 6 Fr Safe-T-Centesis catheter was introduced. Thoracentesis was performed. The catheter was removed and a dressing applied. FINDINGS: A total of approximately 250 mL of hazy yellow fluid was removed. Samples were sent to the laboratory as requested by the clinical team. IMPRESSION: Successful ultrasound guided left thoracentesis yielding 250 mL of pleural fluid. Performed and dictated by Kimble Clas, PA-C Electronically Signed   By: Ester Sides M.D.   On: 06/20/2024 14:13   DG Chest 1 View Result Date: 06/20/2024 CLINICAL DATA:  pleural effusion EXAM: PORTABLE CHEST 1 VIEW COMPARISON:  CHEST XR, 04/19/2024. IR THORACENTESIS, 06/20/2024. CT CHEST, 04/15/2024. FINDINGS: Cardiac silhouette is within normal limits. Aortic arch atherosclerosis. The RIGHT lung is clear. Improved aeration of the LEFT lung post thoracentesis, without residual pleural effusion. No pneumothorax. No focal consolidation or mass. No acute osseous abnormality. IMPRESSION: Improved aeration of the LEFT lung post thoracentesis, without residual pleural effusion. No pneumothorax. Electronically Signed   By: Thom Hall M.D.   On: 06/20/2024 14:12     TODAY-DAY OF DISCHARGE:  Subjective:   Jaquan Sadowsky today has no  headache,no chest abdominal pain,no new weakness tingling or numbness, feels much better wants to go home today.   Objective:   Blood pressure (!) 164/94, pulse 74, temperature 98.2 F (36.8 C), temperature source Oral, resp. rate 17, height 5' 7 (1.702 m), weight 54.9 kg, SpO2 95%.  Intake/Output Summary (  Last 24 hours) at 07/04/2024 0856 Last data filed at 07/04/2024 0556 Gross per 24 hour  Intake --  Output 500 ml  Net -500 ml   Filed Weights   06/28/24 2104 06/29/24 0614  Weight: 58 kg 54.9 kg    Exam: Awake Alert, Oriented *3, No new F.N deficits, Normal affect [place].AT,PERRAL Supple Neck,No JVD, No cervical lymphadenopathy appriciated.  Symmetrical Chest wall movement, Good air movement bilaterally, CTAB RRR,No Gallops,Rubs or new Murmurs, No Parasternal Heave +ve B.Sounds, Abd Soft, Non tender, No organomegaly appriciated, No rebound -guarding or rigidity. No Cyanosis, Clubbing or edema, No new Rash or bruise   PERTINENT RADIOLOGIC STUDIES: DG Swallowing Func-Speech Pathology Result Date: 07/02/2024 Table formatting from the original result was not included. Modified Barium Swallow Study Patient Details Name: Vivian Okelley MRN: [id number] Date of Birth: 17-May-1951 Today's Date: 07/02/2024 HPI/PMH: HPI: Pt is a 73 y.o. M who presents 06/28/2024 with fatigue and AMS. CT abdomen pelvis was obtained which showed left-sided pleural effusion and patchy ground glass opacities.  CT head showed no acute findings. MRI Cervical Spine 2021 with prominent degenerative endplate changes at C3-4 including bulky anterior vertebral spurring with prominent anterior spurring also noted C4-5 and C5-6. Per MD note 11/1 pt's sister reports coughing with PO intake. Pt was seen in August 2025 at which time he was admitted with hypoxic respiratory failure and pleural effusions. No signs of aspiration were noted, but he was impulsive with solids and decision with sister was for more finely chopped foods.  Significant PMH: recurrent UTI, hypertension, cognitive delay. Clinical Impression: Pt exhibits severe oropharyngeal dysphagia (DIGEST score of 3 primarily due to the chronicity and volume of aspiration) characterized by absent epiglottic deflection, incomplete laryngeal closure, and reduced pharyngeal peristalsis. Most recent cervical spine imaging 2021 confirms bulky anterior vertebral spurring of C3-4, C4-5, and C5-6, which limits epiglottic inversion and also affects pharyngeal clearance and UES distension. All liquids are silently aspirated, most notably after the swallow and are only variably sensed (PAS 7, 8). Vallecular residuals of pureed solids also progressed over the epiglottis and into the laryngeal vestibule, though remained above the vocal folds (PAS 3). It is difficult to replicate the rate he uses during meals and although mastication is thorough, he is unable to clear his oral cavity of diffuse palatal residue, which did not clear with a liquid wash. Cognitive deficits limit his ability to use compensatory strategies, including a volitional cough. Pt's presentation is suspected to represent chronic dysphagia and it is recommended that he continue current diet using aspiration precautions. SLP will f/u for ongoing education. Factors that may increase risk of adverse event in presence of aspiration Noe & Lianne 2021): Factors that may increase risk of adverse event in presence of aspiration Noe & Lianne 2021): Reduced cognitive function; Limited mobility; Dependence for feeding and/or oral hygiene; Weak cough; Aspiration of thick, dense, and/or acidic materials; Frequent aspiration of large volumes Recommendations/Plan: Swallowing Evaluation Recommendations Swallowing Evaluation Recommendations Recommendations: PO diet PO Diet Recommendation: Dysphagia 2 (Finely chopped); Thin liquids (Level 0) Liquid Administration via: Cup; Straw Medication Administration: Crushed with puree Supervision:  Full assist for feeding; Full supervision/cueing for swallowing strategies Swallowing strategies  : Minimize environmental distractions; Slow rate; Small bites/sips Postural changes: Position pt fully upright for meals; Stay upright 30-60 min after meals Oral care recommendations: Oral care BID (2x/day); Oral care before PO; Staff/trained caregiver to provide oral care Treatment Plan Treatment Plan Treatment recommendations: Therapy as outlined in treatment plan below Follow-up recommendations:  Home health SLP Functional status assessment: Patient has had a recent decline in their functional status and demonstrates the ability to make significant improvements in function in a reasonable and predictable amount of time. Treatment frequency: Min 2x/week Treatment duration: 1 week Interventions: Aspiration precaution training; Patient/family education; Trials of upgraded texture/liquids; Diet toleration management by SLP Recommendations Recommendations for follow up therapy are one component of a multi-disciplinary discharge planning process, led by the attending physician.  Recommendations may be updated based on patient status, additional functional criteria and insurance authorization. Assessment: Orofacial Exam: Orofacial Exam Oral Cavity: Oral Hygiene: WFL Oral Cavity - Dentition: Missing dentition Orofacial Anatomy: WFL Oral Motor/Sensory Function: WFL Anatomy: Anatomy: Suspected cervical osteophytes Boluses Administered: Boluses Administered Boluses Administered: Thin liquids (Level 0); Mildly thick liquids (Level 2, nectar thick); Moderately thick liquids (Level 3, honey thick); Puree; Solid  Oral Impairment Domain: Oral Impairment Domain Lip Closure: Escape progressing to mid-chin Tongue control during bolus hold: Not tested Bolus preparation/mastication: Timely and efficient chewing and mashing Bolus transport/lingual motion: Delayed initiation of tongue motion (oral holding) Oral residue: Trace residue lining  oral structures Location of oral residue : Tongue; Palate Initiation of pharyngeal swallow : Pyriform sinuses  Pharyngeal Impairment Domain: Pharyngeal Impairment Domain Soft palate elevation: No bolus between soft palate (SP)/pharyngeal wall (PW) Laryngeal elevation: Partial superior movement of thyroid cartilage/partial approximation of arytenoids to epiglottic petiole Anterior hyoid excursion: Partial anterior movement Epiglottic movement: No inversion Laryngeal vestibule closure: Incomplete, narrow column air/contrast in laryngeal vestibule Pharyngeal stripping wave : Present - diminished Pharyngeal contraction (A/P view only): N/A Pharyngoesophageal segment opening: Minimal distention/minimal duration, marked obstruction of flow Tongue base retraction: Trace column of contrast or air between tongue base and PPW Pharyngeal residue: Collection of residue within or on pharyngeal structures Location of pharyngeal residue: Tongue base; Valleculae; Pyriform sinuses  Esophageal Impairment Domain: No data recorded Pill: No data recorded Penetration/Aspiration Scale Score: Penetration/Aspiration Scale Score 1.  Material does not enter airway: Solid 3.  Material enters airway, remains ABOVE vocal cords and not ejected out: Puree 8.  Material enters airway, passes BELOW cords without attempt by patient to eject out (silent aspiration) : Thin liquids (Level 0); Mildly thick liquids (Level 2, nectar thick); Moderately thick liquids (Level 3, honey thick) Compensatory Strategies: Compensatory Strategies Compensatory strategies: No   General Information: Caregiver present: No  Diet Prior to this Study: Dysphagia 2 (finely chopped); Thin liquids (Level 0)   Temperature : Normal   Respiratory Status: WFL   Supplemental O2: None (Room air)   History of Recent Intubation: No  Behavior/Cognition: Alert; Cooperative; Requires cueing Self-Feeding Abilities: Needs set-up for self-feeding Baseline vocal quality/speech: Normal  Volitional Cough: Unable to elicit Volitional Swallow: Able to elicit Exam Limitations: No limitations Goal Planning: Prognosis for improved oropharyngeal function: Fair Barriers to Reach Goals: Cognitive deficits; Time post onset No data recorded Patient/Family Stated Goal: none stated Consulted and agree with results and recommendations: Pt unable/family or caregiver not available; Family member/caregiver; Physician Pain: Pain Assessment Pain Assessment: Faces Faces Pain Scale: 0 Pain Intervention(s): Monitored during session End of Session: Start Time:SLP Start Time (ACUTE ONLY): 1446 Stop Time: SLP Stop Time (ACUTE ONLY): 1501 Time Calculation:SLP Time Calculation (min) (ACUTE ONLY): 15 min Charges: SLP Evaluations $ SLP Speech Visit: 1 Visit SLP Evaluations $MBS Swallow: 1 Procedure $Swallowing Treatment: 1 Procedure SLP visit diagnosis: SLP Visit Diagnosis: Dysphagia, oropharyngeal phase (R13.12) Past Medical History: Past Medical History: Diagnosis Date  Acute UTI (urinary tract infection) 01/01/2023  AKI (acute kidney injury) 11/02/2022  Diabetes mellitus   Hypertension   Mental retardation   Severe sepsis (HCC) 11/02/2022 Past Surgical History: Past Surgical History: Procedure Laterality Date  IR THORACENTESIS ASP PLEURAL SPACE W/IMG GUIDE  06/20/2024  PERICARDIOCENTESIS N/A 03/09/2024  Procedure: PERICARDIOCENTESIS;  Surgeon: Jordan, Peter M, MD;  Location: [facility] INVASIVE CV LAB;  Service: Cardiovascular;  Laterality: N/A; Damien Blumenthal, M.A., CCC-SLP Speech Language Pathology, Acute Rehabilitation Services Secure Chat preferred [phone] 07/02/2024, 3:21 PM    PERTINENT LAB RESULTS: CBC: Recent Labs    07/02/24 0418 07/03/24 0301  WBC 6.6 5.0  HGB 12.1* 11.6*  HCT 39.4 38.1*  PLT 153 154   CMET CMP     Component Value Date/Time   NA 141 07/03/2024 0301   K 3.6 07/03/2024 0301   CL 103 07/03/2024 0301   CO2 27 07/03/2024 0301   GLUCOSE 125 (H) 07/03/2024 0301   BUN 20 07/03/2024 0301    CREATININE 1.22 07/03/2024 0301   CALCIUM  8.5 (L) 07/03/2024 0301   PROT 5.9 (L) 06/28/2024 2152   ALBUMIN 2.7 (L) 06/28/2024 2152   AST 17 06/28/2024 2152   ALT 14 06/28/2024 2152   ALKPHOS 54 06/28/2024 2152   BILITOT 0.5 06/28/2024 2152   GFRNONAA >60 07/03/2024 0301    GFR Estimated Creatinine Clearance: 41.3 mL/min (by C-G formula based on SCr of 1.22 mg/dL). No results for input(s): LIPASE, AMYLASE in the last 72 hours. No results for input(s): CKTOTAL, CKMB, CKMBINDEX, TROPONINI in the last 72 hours. Invalid input(s): POCBNP No results for input(s): DDIMER in the last 72 hours. No results for input(s): HGBA1C in the last 72 hours. No results for input(s): CHOL, HDL, LDLCALC, TRIG, CHOLHDL, LDLDIRECT in the last 72 hours. No results for input(s): TSH, T4TOTAL, T3FREE, THYROIDAB in the last 72 hours.  Invalid input(s): FREET3 No results for input(s): VITAMINB12, FOLATE, FERRITIN, TIBC, IRON, RETICCTPCT in the last 72 hours. Coags: No results for input(s): INR in the last 72 hours.  Invalid input(s): PT Microbiology: Recent Results (from the past 240 hours)  Urine Culture     Status: Abnormal   Collection Time: 06/29/24  1:29 AM   Specimen: Urine, Clean Catch  Result Value Ref Range Status   Specimen Description URINE, CLEAN CATCH  Final   Special Requests   Final    NONE Performed at [facility], [address], [place], [place] [address]    Culture >=100,000 COLONIES/mL ENTEROCOCCUS FAECALIS (A)  Final   Report Status 07/01/2024 FINAL  Final   Organism ID, Bacteria ENTEROCOCCUS FAECALIS (A)  Final      Susceptibility   Enterococcus faecalis - MIC*    AMPICILLIN <=2 SENSITIVE Sensitive     NITROFURANTOIN <=16 SENSITIVE Sensitive     VANCOMYCIN  1 SENSITIVE Sensitive     * >=100,000 COLONIES/mL ENTEROCOCCUS FAECALIS    FURTHER DISCHARGE INSTRUCTIONS:  Get Medicines reviewed and adjusted: Please  take all your medications with you for your next visit with your Primary MD  Laboratory/radiological data: Please request your Primary MD to go over all hospital tests and procedure/radiological results at the follow up, please ask your Primary MD to get all Hospital records sent to his/her office.  In some cases, they will be blood work, cultures and biopsy results pending at the time of your discharge. Please request that your primary care M.D. goes through all the records of your hospital data and follows up on these results.  Also Note the following:  If you experience worsening of your admission symptoms, develop shortness of breath, life threatening emergency, suicidal or homicidal thoughts you must seek medical attention immediately by calling 911 or calling your MD immediately  if symptoms less severe.  You must read complete instructions/literature along with all the possible adverse reactions/side effects for all the Medicines you take and that have been prescribed to you. Take any new Medicines after you have completely understood and accpet all the possible adverse reactions/side effects.   Do not drive when taking Pain medications or sleeping medications (Benzodaizepines)  Do not take more than prescribed Pain, Sleep and Anxiety Medications. It is not advisable to combine anxiety,sleep and pain medications without talking with your primary care practitioner  Special Instructions: If you have smoked or chewed Tobacco  in the last 2 yrs please stop smoking, stop any regular Alcohol  and or any Recreational drug use.  Wear Seat belts while driving.  Please note: You were cared for by a hospitalist during your hospital stay. Once you are discharged, your primary care physician will handle any further medical issues. Please note that NO REFILLS for any discharge medications will be authorized once you are discharged, as it is imperative that you return to your primary care physician (or  establish a relationship with a primary care physician if you do not have one) for your post hospital discharge needs so that they can reassess your need for medications and monitor your lab values.  Total Time spent coordinating discharge including counseling, education and face to face time equals greater than 30 minutes.  SignedBETHA Donalda Applebaum 07/04/2024 8:56 AM

## 2024-07-08 ENCOUNTER — Ambulatory Visit: Admitting: Internal Medicine

## 2024-07-08 ENCOUNTER — Encounter: Payer: Self-pay | Admitting: Internal Medicine

## 2024-07-08 VITALS — BP 162/78 | HR 78 | Temp 98.5°F | Ht 62.0 in | Wt 132.4 lb

## 2024-07-08 DIAGNOSIS — I5032 Chronic diastolic (congestive) heart failure: Secondary | ICD-10-CM

## 2024-07-08 DIAGNOSIS — R6250 Unspecified lack of expected normal physiological development in childhood: Secondary | ICD-10-CM | POA: Diagnosis not present

## 2024-07-08 DIAGNOSIS — J690 Pneumonitis due to inhalation of food and vomit: Secondary | ICD-10-CM

## 2024-07-08 DIAGNOSIS — J90 Pleural effusion, not elsewhere classified: Secondary | ICD-10-CM

## 2024-07-08 NOTE — Progress Notes (Signed)
 Fred [name]    [id number]    [birth year]  Primary Care Physician:Bouska, Alm, MD Date of Appointment: 07/08/2024 Established Patient Visit  Chief complaint:   Chief Complaint  Patient presents with   Medical Management of Chronic Issues    05/08/24 Bilateral Pleural effusion       HPI: Fred [name] is a 73 year old gentleman with a history of cognitive impairment, nonverbal, who lives in a group home.  He was hospitalized in August 2025 for weakness, falls, possible pneumonia.  He was found to have pericardial effusion and underwent drain placement.  He had bilateral pleural effusions which were drained.  Cytology from all 3 of these was negative for malignancy.  He was treated empirically for pneumonia.  He was started on colchicine  for the pericardial effusion.  Interval Updates: Here with a caregiver today from group home.  Left pleural effusion s/p thoracentesis. 250 cc yellow hazy drained. Cultures reviewed - negative. Mildly exudative based on elevated protein LDH. However LDH downtrending 293, 269, 74 suggesting a resolving or not actively inflammatory process. Cytology negative. Cell count not sent.  This was done in October. The following week he was hospitalized for pneumonia. There was some concern for aspiration. HE has a modified diet recommendation dysphagia 2 finely chopped, and he is to be seeing SLP outpatient soon.   I have reviewed the patient's family social and past medical history and updated as appropriate.   Past Medical History:  Diagnosis Date   Acute UTI (urinary tract infection) 01/01/2023   AKI (acute kidney injury) 11/02/2022   Diabetes mellitus    Hypertension    Mental retardation    Severe sepsis (HCC) 11/02/2022    Past Surgical History:  Procedure Laterality Date   IR THORACENTESIS ASP PLEURAL SPACE W/IMG GUIDE  06/20/2024   PERICARDIOCENTESIS N/A 03/09/2024   Procedure: PERICARDIOCENTESIS;  Surgeon: Jordan, Peter M, MD;   Location: [facility] INVASIVE CV LAB;  Service: Cardiovascular;  Laterality: N/A;    Family History  Family history unknown: Yes    Social History   Occupational History   Not on file  Tobacco Use   Smoking status: Former    Current packs/day: 0.00    Types: Cigarettes    Quit date: 06/24/2016    Years since quitting: 8.0   Smokeless tobacco: Never  Substance and Sexual Activity   Alcohol use: No   Drug use: No   Sexual activity: Not on file     Physical Exam: Blood pressure (!) 162/78, pulse 78, temperature 98.5 F (36.9 C), temperature source Oral, height 5' 2 (1.575 m), weight 132 lb 6.4 oz (60.1 kg), SpO2 99%.  Gen:      Thin, elderly, non verbal, poor oral tone, drooling Lungs:    ctab no wheezes or crackles CV:         RRR no mrg no edema   Data Reviewed: Imaging: I have personally reviewed the chest xray  June 28 2024 - left lower lobe pneumonia.   Right pleural effusion 07/08/2024    Left pleural effusion 07/08/24   PFTs:  Labs: Cytology left pleural fluid 04/20/24 negative for malignant cells Cytology right pleural fluid 04/15/24 negative for malignant cells Cytology pericardial fluid 03/09/24 shows resactive mesothelial cells Cytology right pleural effusion 05/2024 negative for malignant cells  Immunization status: Immunization History  Administered Date(s) Administered   Pfizer Covid-19 Vaccine Bivalent Booster 40yrs & up 07/28/2021   Pneumococcal Conjugate-13 08/04/2017   Pneumococcal  Polysaccharide-23 08/31/2018    External Records Personally Reviewed: hospital stay 06/2024  Assessment and Plan  Recurrent bilateral pleural effusion S/p right thoracentesis Recurrent aspiration pneumonia Chronic HFpEF  Clinically resolved effusion. No clinical evidence of pneumonia. Suspect aspiration related effusion vs related to heart failure. Unlikely to be related to pleuropericarditis given downtrending LDH. Follow up with SLP services to prevent  aspiration.    I spent 30 minutes in the care of this patient today including pre-charting, chart review, review of results, face-to-face care, coordination of care and communication with consultants etc.).  Return to Care: Return in about 6 months (around 01/05/2025) for Dr. Zaida.   Fred Gore, MD Pulmonary and Critical Care Medicine [facility] Office:[phone]

## 2024-07-08 NOTE — Patient Instructions (Signed)
 It was a pleasure to see you today!  Please schedule follow up with Dr Zaida in 6 months.  If my schedule is not open yet, we will contact you with a reminder closer to that time. Please call [phone] if you haven't heard from us  a month before, and always call us  sooner if issues or concerns arise. You can also send us  a message through MyChart, but but aware that this is not to be used for urgent issues and it may take up to 5-7 days to receive a reply. Please be aware that you will likely be able to view your results before I have a chance to respond to them. Please give us  5 business days to respond to any non-urgent results.    Lungs sound clear. The fluid in the lungs is most likely bland and not related to cancer or any ongoing infection. Please follow up with speech therapy to prevent aspiration. Aspiration pneumonia can cause fluid like this to build up. Continue the colchicine  and follow up with cardiologist regarding the inflammation around the heart as this can also cause fluid to build up. Please call us  back sooner if needed if breathing is worsening.

## 2024-08-06 ENCOUNTER — Ambulatory Visit: Admitting: Podiatry

## 2024-08-06 ENCOUNTER — Encounter: Payer: Self-pay | Admitting: Podiatry

## 2024-08-06 DIAGNOSIS — B351 Tinea unguium: Secondary | ICD-10-CM

## 2024-08-06 DIAGNOSIS — E119 Type 2 diabetes mellitus without complications: Secondary | ICD-10-CM

## 2024-08-06 NOTE — Progress Notes (Signed)
 This patient returns to my office for at risk foot care.  This patient requires this care by a professional since this patient will be at risk due to having diabetes.  patient is unable to cut nails himself since the patient cannot reach his nails.These nails are painful walking and wearing shoes.  Patient presents to the office with a caregiver. This patient presents for at risk foot care today.  General Appearance  Alert, conversant and in no acute stress.  Vascular  Dorsalis pedis and posterior tibial  pulses are palpable  bilaterally.  Capillary return is within normal limits  bilaterally. Temperature is within normal limits  bilaterally.  Neurologic  Senn-Weinstein monofilament wire test within normal limits  bilaterally. Muscle power within normal limits bilaterally.  Nails Thick disfigured discolored nails with subungual debris  from hallux to fifth toes bilaterally. No evidence of bacterial infection or drainage bilaterally. Dry blood noted under second toenail right foot.  Orthopedic  No limitations of motion  feet .  No crepitus or effusions noted.  No bony pathology or digital deformities noted.  Skin  normotropic skin with no porokeratosis noted bilaterally.  No signs of infections or ulcers noted.     Onychomycosis  Pain in right toes  Pain in left toes  Contusion/subungual hematoma right second toe.   Consent was obtained for treatment procedures.   Mechanical debridement of nails 1-5  bilaterally performed with a nail nipper.  Filed with dremel without incident.    Return office visit    4 months                 Told patient to return for periodic foot care and evaluation due to potential at risk complications.   Ruffin Cotton DPM
